# Patient Record
Sex: Female | Born: 1985 | Race: White | Hispanic: No | Marital: Married | Smoking: Former smoker
Health system: Southern US, Community
[De-identification: ages and names within clinical notes are randomized; demographics above are authoritative.]

## PROBLEM LIST (undated history)

## (undated) DIAGNOSIS — E7800 Pure hypercholesterolemia, unspecified: Secondary | ICD-10-CM

## (undated) DIAGNOSIS — F410 Panic disorder [episodic paroxysmal anxiety] without agoraphobia: Secondary | ICD-10-CM

## (undated) DIAGNOSIS — J189 Pneumonia, unspecified organism: Secondary | ICD-10-CM

## (undated) DIAGNOSIS — F32A Depression, unspecified: Secondary | ICD-10-CM

## (undated) DIAGNOSIS — R0602 Shortness of breath: Secondary | ICD-10-CM

## (undated) DIAGNOSIS — F1990 Other psychoactive substance use, unspecified, uncomplicated: Secondary | ICD-10-CM

## (undated) DIAGNOSIS — E282 Polycystic ovarian syndrome: Secondary | ICD-10-CM

## (undated) DIAGNOSIS — C50919 Malignant neoplasm of unspecified site of unspecified female breast: Secondary | ICD-10-CM

## (undated) DIAGNOSIS — K219 Gastro-esophageal reflux disease without esophagitis: Secondary | ICD-10-CM

## (undated) DIAGNOSIS — K760 Fatty (change of) liver, not elsewhere classified: Secondary | ICD-10-CM

## (undated) DIAGNOSIS — I499 Cardiac arrhythmia, unspecified: Secondary | ICD-10-CM

## (undated) DIAGNOSIS — R12 Heartburn: Secondary | ICD-10-CM

## (undated) DIAGNOSIS — E559 Vitamin D deficiency, unspecified: Secondary | ICD-10-CM

## (undated) DIAGNOSIS — E119 Type 2 diabetes mellitus without complications: Secondary | ICD-10-CM

## (undated) DIAGNOSIS — K429 Umbilical hernia without obstruction or gangrene: Secondary | ICD-10-CM

## (undated) DIAGNOSIS — K5900 Constipation, unspecified: Secondary | ICD-10-CM

## (undated) DIAGNOSIS — R079 Chest pain, unspecified: Secondary | ICD-10-CM

## (undated) DIAGNOSIS — I10 Essential (primary) hypertension: Secondary | ICD-10-CM

## (undated) DIAGNOSIS — F419 Anxiety disorder, unspecified: Secondary | ICD-10-CM

## (undated) DIAGNOSIS — M7989 Other specified soft tissue disorders: Secondary | ICD-10-CM

## (undated) DIAGNOSIS — R000 Tachycardia, unspecified: Secondary | ICD-10-CM

## (undated) DIAGNOSIS — Z9221 Personal history of antineoplastic chemotherapy: Secondary | ICD-10-CM

## (undated) DIAGNOSIS — M549 Dorsalgia, unspecified: Secondary | ICD-10-CM

## (undated) DIAGNOSIS — D649 Anemia, unspecified: Secondary | ICD-10-CM

## (undated) DIAGNOSIS — G5602 Carpal tunnel syndrome, left upper limb: Secondary | ICD-10-CM

## (undated) DIAGNOSIS — R002 Palpitations: Secondary | ICD-10-CM

## (undated) DIAGNOSIS — M2550 Pain in unspecified joint: Secondary | ICD-10-CM

## (undated) DIAGNOSIS — E611 Iron deficiency: Secondary | ICD-10-CM

## (undated) HISTORY — PX: ADENOIDECTOMY: SUR15

## (undated) HISTORY — DX: Shortness of breath: R06.02

## (undated) HISTORY — DX: Pain in unspecified joint: M25.50

## (undated) HISTORY — DX: Anxiety disorder, unspecified: F41.9

## (undated) HISTORY — DX: Other psychoactive substance use, unspecified, uncomplicated: F19.90

## (undated) HISTORY — DX: Dorsalgia, unspecified: M54.9

## (undated) HISTORY — DX: Other specified soft tissue disorders: M79.89

## (undated) HISTORY — DX: Iron deficiency: E61.1

## (undated) HISTORY — DX: Palpitations: R00.2

## (undated) HISTORY — DX: Constipation, unspecified: K59.00

## (undated) HISTORY — DX: Chest pain, unspecified: R07.9

## (undated) HISTORY — DX: Heartburn: R12

## (undated) HISTORY — DX: Pure hypercholesterolemia, unspecified: E78.00

## (undated) HISTORY — DX: Vitamin D deficiency, unspecified: E55.9

## (undated) HISTORY — DX: Carpal tunnel syndrome, left upper limb: G56.02

## (undated) HISTORY — DX: Tachycardia, unspecified: R00.0

## (undated) HISTORY — PX: TONSILLECTOMY: SUR1361

## (undated) HISTORY — DX: Malignant neoplasm of unspecified site of unspecified female breast: C50.919

## (undated) HISTORY — DX: Fatty (change of) liver, not elsewhere classified: K76.0

---

## 2013-05-21 LAB — HM HIV SCREENING LAB: HM HIV Screening: NEGATIVE

## 2015-02-07 ENCOUNTER — Ambulatory Visit: Payer: Self-pay | Admitting: Skilled Nursing Facility1

## 2016-11-06 LAB — HM PAP SMEAR: HM Pap smear: NEGATIVE

## 2016-11-06 LAB — RESULTS CONSOLE HPV: CHL HPV: NEGATIVE

## 2016-11-14 ENCOUNTER — Other Ambulatory Visit (HOSPITAL_COMMUNITY): Payer: Self-pay | Admitting: Family

## 2016-11-14 DIAGNOSIS — R0602 Shortness of breath: Secondary | ICD-10-CM

## 2016-11-23 ENCOUNTER — Other Ambulatory Visit (HOSPITAL_COMMUNITY): Payer: Self-pay

## 2016-11-25 ENCOUNTER — Emergency Department: Payer: Medicaid Other

## 2016-11-25 ENCOUNTER — Emergency Department
Admission: EM | Admit: 2016-11-25 | Discharge: 2016-11-26 | Disposition: A | Discharge status: Home / Self Care | Payer: Medicaid Other | Attending: Emergency Medicine | Admitting: Emergency Medicine

## 2016-11-25 ENCOUNTER — Encounter: Payer: Self-pay | Admitting: Emergency Medicine

## 2016-11-25 DIAGNOSIS — Z79899 Other long term (current) drug therapy: Secondary | ICD-10-CM | POA: Diagnosis not present

## 2016-11-25 DIAGNOSIS — Z7984 Long term (current) use of oral hypoglycemic drugs: Secondary | ICD-10-CM | POA: Diagnosis not present

## 2016-11-25 DIAGNOSIS — E119 Type 2 diabetes mellitus without complications: Secondary | ICD-10-CM | POA: Insufficient documentation

## 2016-11-25 DIAGNOSIS — Z87891 Personal history of nicotine dependence: Secondary | ICD-10-CM | POA: Diagnosis not present

## 2016-11-25 DIAGNOSIS — I10 Essential (primary) hypertension: Secondary | ICD-10-CM | POA: Diagnosis not present

## 2016-11-25 DIAGNOSIS — R079 Chest pain, unspecified: Secondary | ICD-10-CM | POA: Diagnosis present

## 2016-11-25 DIAGNOSIS — R0789 Other chest pain: Secondary | ICD-10-CM

## 2016-11-25 HISTORY — DX: Polycystic ovarian syndrome: E28.2

## 2016-11-25 HISTORY — DX: Panic disorder (episodic paroxysmal anxiety): F41.0

## 2016-11-25 HISTORY — DX: Type 2 diabetes mellitus without complications: E11.9

## 2016-11-25 HISTORY — DX: Essential (primary) hypertension: I10

## 2016-11-25 LAB — BASIC METABOLIC PANEL
ANION GAP: 12 (ref 5–15)
BUN: 15 mg/dL (ref 6–20)
CHLORIDE: 99 mmol/L — AB (ref 101–111)
CO2: 23 mmol/L (ref 22–32)
Calcium: 9.4 mg/dL (ref 8.9–10.3)
Creatinine, Ser: 0.9 mg/dL (ref 0.44–1.00)
GFR calc non Af Amer: >60 mL/min (ref >60)
Glucose, Bld: 197 mg/dL — ABNORMAL HIGH (ref 65–99)
POTASSIUM: 3.8 mmol/L (ref 3.5–5.1)
Sodium: 134 mmol/L — ABNORMAL LOW (ref 135–145)

## 2016-11-25 LAB — CBC
HEMATOCRIT: 38.1 % (ref 35.0–47.0)
HEMOGLOBIN: 12.8 g/dL (ref 12.0–16.0)
MCH: 24.3 pg — AB (ref 26.0–34.0)
MCHC: 33.6 g/dL (ref 32.0–36.0)
MCV: 72.3 fL — AB (ref 80.0–100.0)
Platelets: 343 10*3/uL (ref 150–440)
RBC: 5.26 MIL/uL — AB (ref 3.80–5.20)
RDW: 23.5 % — ABNORMAL HIGH (ref 11.5–14.5)
WBC: 10.7 10*3/uL (ref 3.6–11.0)

## 2016-11-25 LAB — TROPONIN I: Troponin I: <0.03 ng/mL (ref <0.03)

## 2016-11-25 LAB — HCG, QUANTITATIVE, PREGNANCY: hCG, Beta Chain, Quant, S: 1 m[IU]/mL (ref <5)

## 2016-11-25 MED ORDER — IOPAMIDOL (ISOVUE-370) INJECTION 76%
75.0000 mL | Freq: Once | INTRAVENOUS | Status: AC | PRN
  Administered 2016-11-25: 150 mL via INTRAVENOUS

## 2016-11-25 NOTE — ED Notes (Signed)
Patient transported to CT 

## 2016-11-25 NOTE — ED Triage Notes (Signed)
Pt c/o left sided chest tightness for 45 minutes; radiates down her left arm; pt says she started having ringing in her left ear and then the chest pain started; short of breath, nausea but no vomiting, feeling weak, dizzy and lightheaded; pt was ambulatory into lobby with steady gait and assisted to wheelchair; talking in complete coherent sentences; pt says she has a history of panic attacks but "It's a fine line"

## 2016-11-25 NOTE — ED Notes (Signed)
Patient transported to X-ray 

## 2016-11-25 NOTE — ED Provider Notes (Signed)
[facility] Emergency Department Provider Note  ____________________________________________   First MD Initiated Contact with Patient 11/25/16 2309     (approximate)  I have reviewed the triage vital signs and the nursing notes.   HISTORY  Chief Complaint Chest Pain   HPI Lynn [name] is a 31 y.o. female who self presents to the emergency department with chest pain and shortness of breath. The pain began insidiously roughly an hour prior to arrival. It began after putting her daughter to sleep in the basement climbing stairs. It is sharp and tight moderate severity left chest constant and nonexertional radiating to her left arm. It seems to have eased off on its own now. It is associated with a sense of anxiety. She has a history of hypertension and diabetes. She has had no recent surgery travel or immobilization. No hemoptysis. She  Does not take OCPs. She is had some bilateral lower extremity swelling recently. She's never had a DVT or pulmonary embolism. She has a family history of hypertension.   Past Medical History:  Diagnosis Date  . Diabetes ([place])   . Hypertension   . Panic attacks   . PCOS (polycystic ovarian syndrome)     There are no active problems to display for this patient.   Past Surgical History:  Procedure Laterality Date  . ADENOIDECTOMY    . CESAREAN SECTION    . TONSILLECTOMY      Prior to Admission medications   Medication Sig Start Date End Date Taking? Authorizing Provider  ferrous sulfate 325 (65 FE) MG EC tablet Take 325 mg by mouth 3 (three) times daily with meals.   Yes [provider]  lisinopril (PRINIVIL,ZESTRIL) 10 MG tablet Take 10 mg by mouth daily.   Yes [provider]  metFORMIN (GLUCOPHAGE) 1000 MG tablet Take 1,000 mg by mouth 2 (two) times daily with a meal.   Yes [provider]    Allergies Ibuprofen  History reviewed. No pertinent family history.  Social History Social  History  Substance Use Topics  . Smoking status: Former Research scientist (life sciences)  . Smokeless tobacco: Never Used  . Alcohol use Yes     Comment: rarely    Review of Systems Constitutional: No fever/chills Eyes: No visual changes. ENT: No sore throat. Cardiovascular: positive chest pain. Respiratory: positive shortness of breath. Gastrointestinal: No abdominal pain.  No nausea, no vomiting.  No diarrhea.  No constipation. Genitourinary: Negative for dysuria. Musculoskeletal: Negative for back pain. Skin: Negative for rash. Neurological: Negative for headaches, focal weakness or numbness.   ____________________________________________   PHYSICAL EXAM:  VITAL SIGNS: ED Triage Vitals  Enc Vitals Group     BP 11/25/16 2207 132/73     Pulse Rate 11/25/16 2207 (!) 114     Resp 11/25/16 2207 (!) 32     Temp 11/25/16 2207 98.6 F (37 C)     Temp Source 11/25/16 2207 Oral     SpO2 11/25/16 2207 96 %     Weight 11/25/16 2208 (!) 342 lb (155.1 kg)     Height 11/25/16 2208 5\' 7"  (1.702 m)     Head Circumference --      Peak Flow --      Pain Score 11/25/16 2207 6     Pain Loc --      Pain Edu? --      Excl. in [place]? --     Constitutional: alert and oriented 4 appears somewhat short of breath but able to speak  in full clear sentences Eyes: PERRL EOMI. Head: Atraumatic. Nose: No congestion/rhinnorhea. Mouth/Throat: No trismus Neck: No stridor.   Cardiovascular: tachycardic rate, regular rhythm. Grossly normal heart sounds.  Good peripheral circulation. Respiratory: Normal respiratory effort.  No retractions. Lungs CTAB and moving good air Gastrointestinal: obese soft nontender Musculoskeletal: No lower extremity edema  Legs are equal in size Neurologic:  Normal speech and language. No gross focal neurologic deficits are appreciated. Skin:  Skin is warm, dry and intact. No rash noted. Psychiatric: Mood and affect are normal. Speech and behavior are  normal.    ____________________________________________   DIFFERENTIAL includes but not limited to  Pulmonary embolism, acute coronary syndrome, pneumonia, pneumothorax, Boerhaave syndrome, aortic dissection, pleurisy ____________________________________________   LABS (all labs ordered are listed, but only abnormal results are displayed)  Labs Reviewed  BASIC METABOLIC PANEL - Abnormal; Notable for the following:       Result Value   Sodium 134 (*)    Chloride 99 (*)    Glucose, Bld 197 (*)    All other components within normal limits  CBC - Abnormal; Notable for the following:    RBC 5.26 (*)    MCV 72.3 (*)    MCH 24.3 (*)    RDW 23.5 (*)    All other components within normal limits  TROPONIN I  TROPONIN I  HCG, QUANTITATIVE, PREGNANCY    Troponin negative 2 __________________________________________  EKG  ED ECG REPORT I, Darel Hong, the attending physician, personally viewed and interpreted this ECG.  Date: 11/25/2016 EKG Time: 2205 Rate: 130 Rhythm: inus tachycardia QRS Axis: normal Intervals: normal ST/T Wave abnormalities: normal Narrative Interpretation: sinus tachycardia with no signs of acute ischemia abnormal EKG  ____________________________________________  RADIOLOGY  CT scan was suboptimal but no large pulmonary embolism ____________________________________________   PROCEDURES  Procedure(s) performed: no  Procedures  Critical Care performed: no  Observation: yes  ----------------------------------------- 2315 on 11/25/2016 -----------------------------------------   OBSERVATION CARE: This patient is being placed under observation care for the following reasons: Chest pain with repeat testing to rule out ischemia   ____________________________________________   INITIAL IMPRESSION / ASSESSMENT AND PLAN / ED COURSE  Pertinent labs & imaging results that were available during my care of the patient were reviewed by me  and considered in my medical decision making (see chart for details).  The patient arrives somewhat short of breath and tachycardic although she said that she has long-standing tachycardia of unclear etiology. Differential is broad but most prominently unconcerned about pulmonary embolism. I do not believe the patient is low risk so d-dimer is an adequate and she will require CT scan as well as a delta troponin.  ----------------------------------------- 1:26 AM on 11/26/2016 ----------------------------------------- The patient's pain is improved pending a repeat troponin     ------------------------------------ 3:31 AM on 11/26/2016 -----------------------------------------   END OF OBSERVATION STATUS: After an appropriate period of observation, this patient is being discharged due to the following reason(s):  Second troponin negative and CT imaging negative. The patient feels improved. I will refer her to cardiology outpatient and she is medically stable for outpatient management. She does not require inpatient risk stratification. ___________________________________________   FINAL CLINICAL IMPRESSION(S) / ED DIAGNOSES  Final diagnoses:  Atypical chest pain      NEW MEDICATIONS STARTED DURING THIS VISIT:  Discharge Medication List as of 11/26/2016  3:30 AM       Note:  This document was prepared using Dragon voice recognition software and may include unintentional dictation errors.  Darel Hong, MD 11/26/16 [phone]

## 2016-11-26 ENCOUNTER — Telehealth: Payer: Self-pay

## 2016-11-26 LAB — TROPONIN I: Troponin I: <0.03 ng/mL (ref <0.03)

## 2016-11-26 NOTE — Discharge Instructions (Signed)
Fortunately today your blood work as well as your CT scan were normal. I do not know exactly why you had chest pain, and I would like you to see a cardiologist later on this week for recheck. Return to the emergency department for any concerns.  It was a pleasure to take care of you today, and thank you for coming to our emergency department.  If you have any questions or concerns before leaving please ask the nurse to grab me and I'm more than happy to go through your aftercare instructions again.  If you were prescribed any opioid pain medication today such as Norco, Vicodin, Percocet, morphine, hydrocodone, or oxycodone please make sure you do not drive when you are taking this medication as it can alter your ability to drive safely.  If you have any concerns once you are home that you are not improving or are in fact getting worse before you can make it to your follow-up appointment, please do not hesitate to call 911 and come back for further evaluation.  Darel Hong, MD  Results for orders placed or performed during the hospital encounter of 85/63/14  Basic metabolic panel  Result Value Ref Range   Sodium 134 (L) 135 - 145 mmol/L   Potassium 3.8 3.5 - 5.1 mmol/L   Chloride 99 (L) 101 - 111 mmol/L   CO2 23 22 - 32 mmol/L   Glucose, Bld 197 (H) 65 - 99 mg/dL   BUN 15 6 - 20 mg/dL   Creatinine, Ser 0.90 0.44 - 1.00 mg/dL   Calcium 9.4 8.9 - 10.3 mg/dL   GFR calc non Af Amer >60 >60 mL/min   GFR calc Af Amer >60 >60 mL/min   Anion gap 12 5 - 15  CBC  Result Value Ref Range   WBC 10.7 3.6 - 11.0 K/uL   RBC 5.26 (H) 3.80 - 5.20 MIL/uL   Hemoglobin 12.8 12.0 - 16.0 g/dL   HCT 38.1 35.0 - 47.0 %   MCV 72.3 (L) 80.0 - 100.0 fL   MCH 24.3 (L) 26.0 - 34.0 pg   MCHC 33.6 32.0 - 36.0 g/dL   RDW 23.5 (H) 11.5 - 14.5 %   Platelets 343 150 - 440 K/uL  Troponin I  Result Value Ref Range   Troponin I <0.03 <0.03 ng/mL  Troponin I  Result Value Ref Range   Troponin I <0.03 <0.03 ng/mL    hCG, quantitative, pregnancy  Result Value Ref Range   hCG, Beta Chain, Quant, S 1 <5 mIU/mL   Dg Chest 2 View  Result Date: 11/25/2016 CLINICAL DATA:  Left-sided chest tightness EXAM: CHEST  2 VIEW COMPARISON:  None. FINDINGS: Accentuated perihilar interstitial opacity. No focal consolidation or pleural effusion. Normal cardiomediastinal silhouette. No pneumothorax. IMPRESSION: 1. No radiographic evidence for acute cardiopulmonary abnormality. Electronically Signed   By: Donavan Foil M.D.   On: 11/25/2016 22:41   Ct Angio Chest Pe W/cm &/or Wo Cm  Result Date: 11/26/2016 CLINICAL DATA:  Left-sided chest pain EXAM: CT ANGIOGRAPHY CHEST WITH CONTRAST TECHNIQUE: Multidetector CT imaging of the chest was performed using the standard protocol during bolus administration of intravenous contrast. Multiplanar CT image reconstructions and MIPs were obtained to evaluate the vascular anatomy. CONTRAST:  150 mL Isovue 370 intravenous COMPARISON:  11/25/2016 FINDINGS: Cardiovascular: Suboptimal study secondary to large habitus with overall grainy appearance of images. Suboptimal opacification of pulmonary arterial system despite re- bolus. Respiratory motion artifact. No significant filling defects visualized within the central  or main segmental pulmonary arterial system to suggest acute embolus. Normal heart size. No pericardial effusion. Non aneurysmal aorta. Mediastinum/Nodes: No enlarged mediastinal, hilar, or axillary lymph nodes. Thyroid gland, trachea, and esophagus demonstrate no significant findings. Lungs/Pleura: Lungs are clear. No pleural effusion or pneumothorax. Upper Abdomen: Fatty liver.  No acute abnormality. Musculoskeletal: No chest wall abnormality. No acute or significant osseous findings. Review of the MIP images confirms the above findings. IMPRESSION: 1. Suboptimal study secondary to habitus, respiratory motion and suboptimal bolus. No acute pulmonary embolus is seen. 2. Clear lung fields 3.  Hepatic steatosis Electronically Signed   By: Donavan Foil M.D.   On: 11/26/2016 00:08

## 2016-11-26 NOTE — ED Notes (Signed)
Patient r/f CT. 

## 2016-11-26 NOTE — Telephone Encounter (Signed)
Lmov for patient to call back  They were seen in ED on 11/25/16 with CP Will try again at a later time

## 2016-11-28 ENCOUNTER — Ambulatory Visit (HOSPITAL_COMMUNITY)
Admission: RE | Admit: 2016-11-28 | Discharge: 2016-11-28 | Disposition: A | Discharge status: Home / Self Care | Payer: Medicaid Other | Source: Ambulatory Visit | Attending: Family | Admitting: Family

## 2016-11-28 DIAGNOSIS — R0602 Shortness of breath: Secondary | ICD-10-CM | POA: Diagnosis not present

## 2016-11-28 DIAGNOSIS — E119 Type 2 diabetes mellitus without complications: Secondary | ICD-10-CM | POA: Insufficient documentation

## 2016-11-28 DIAGNOSIS — I10 Essential (primary) hypertension: Secondary | ICD-10-CM | POA: Insufficient documentation

## 2016-11-28 NOTE — Progress Notes (Signed)
  Echocardiogram 2D Echocardiogram has been performed.  Lynn [name] 11/28/2016, 4:20 PM

## 2017-01-03 ENCOUNTER — Emergency Department: Payer: Medicaid Other

## 2017-01-03 ENCOUNTER — Encounter: Payer: Self-pay | Admitting: Emergency Medicine

## 2017-01-03 ENCOUNTER — Emergency Department
Admission: EM | Admit: 2017-01-03 | Discharge: 2017-01-03 | Disposition: A | Discharge status: Home / Self Care | Payer: Medicaid Other | Attending: Emergency Medicine | Admitting: Emergency Medicine

## 2017-01-03 DIAGNOSIS — I10 Essential (primary) hypertension: Secondary | ICD-10-CM | POA: Insufficient documentation

## 2017-01-03 DIAGNOSIS — R42 Dizziness and giddiness: Secondary | ICD-10-CM

## 2017-01-03 DIAGNOSIS — E119 Type 2 diabetes mellitus without complications: Secondary | ICD-10-CM | POA: Diagnosis not present

## 2017-01-03 DIAGNOSIS — Z87891 Personal history of nicotine dependence: Secondary | ICD-10-CM | POA: Insufficient documentation

## 2017-01-03 DIAGNOSIS — R55 Syncope and collapse: Secondary | ICD-10-CM | POA: Insufficient documentation

## 2017-01-03 DIAGNOSIS — K529 Noninfective gastroenteritis and colitis, unspecified: Secondary | ICD-10-CM | POA: Diagnosis not present

## 2017-01-03 LAB — CBC
HCT: 40.3 % (ref 35.0–47.0)
Hemoglobin: 13.6 g/dL (ref 12.0–16.0)
MCH: 25.8 pg — ABNORMAL LOW (ref 26.0–34.0)
MCHC: 33.8 g/dL (ref 32.0–36.0)
MCV: 76.4 fL — AB (ref 80.0–100.0)
PLATELETS: 323 10*3/uL (ref 150–440)
RBC: 5.28 MIL/uL — ABNORMAL HIGH (ref 3.80–5.20)
RDW: 15.7 % — AB (ref 11.5–14.5)
WBC: 10.2 10*3/uL (ref 3.6–11.0)

## 2017-01-03 LAB — BASIC METABOLIC PANEL
Anion gap: 12 (ref 5–15)
BUN: 11 mg/dL (ref 6–20)
CALCIUM: 9.2 mg/dL (ref 8.9–10.3)
CO2: 23 mmol/L (ref 22–32)
CREATININE: 0.79 mg/dL (ref 0.44–1.00)
Chloride: 98 mmol/L — ABNORMAL LOW (ref 101–111)
GFR calc Af Amer: >60 mL/min (ref >60)
Glucose, Bld: 256 mg/dL — ABNORMAL HIGH (ref 65–99)
Potassium: 4.4 mmol/L (ref 3.5–5.1)
SODIUM: 133 mmol/L — AB (ref 135–145)

## 2017-01-03 LAB — TROPONIN I

## 2017-01-03 LAB — GLUCOSE, CAPILLARY: GLUCOSE-CAPILLARY: 246 mg/dL — AB (ref 65–99)

## 2017-01-03 MED ORDER — SODIUM CHLORIDE 0.9 % IV SOLN
Freq: Once | INTRAVENOUS | Status: AC
  Administered 2017-01-03: 16:00:00 via INTRAVENOUS

## 2017-01-03 MED ORDER — SODIUM CHLORIDE 0.9 % IV SOLN: Freq: Once | INTRAVENOUS | Status: DC

## 2017-01-03 MED ORDER — ONDANSETRON 4 MG PO TBDP: 4.0000 mg | ORAL_TABLET | Freq: Three times a day (TID) | ORAL | 0 refills | Status: DC | PRN

## 2017-01-03 NOTE — ED Provider Notes (Signed)
[facility] Emergency Department Provider Note       Time seen: ----------------------------------------- 4:25 PM on 01/03/2017 -----------------------------------------     I have reviewed the triage vital signs and the nursing notes.   HISTORY   Chief Complaint Dizziness    HPI Lynn [name] is a 31 y.o. female who presents to the ED for intermittent left arm tingling and pains last night. Patient reports she "just feels awful". Patient reports some blurred vision, nausea and diarrhea for the past 3 days. She has had some vomiting as well. She states she is dyspneic with ambulation, wraps tachycardic but with otherwise normal vital signs. She denies fevers, chills or other complaints.  Past Medical History:  Diagnosis Date  . Diabetes ([place])   . Hypertension   . Panic attacks   . PCOS (polycystic ovarian syndrome)     There are no active problems to display for this patient.   Past Surgical History:  Procedure Laterality Date  . ADENOIDECTOMY    . CESAREAN SECTION    . TONSILLECTOMY      Allergies Ibuprofen  Social History Social History  Substance Use Topics  . Smoking status: Former Smoker    Types: Cigarettes  . Smokeless tobacco: Never Used  . Alcohol use Yes     Comment: rarely    Review of Systems Constitutional: Negative for fever. Cardiovascular: Negative for chest pain. Respiratory: Negative for shortness of breath. Gastrointestinal: positive for nausea, vomiting and diarrhea Genitourinary: Negative for dysuria. Musculoskeletal: Negative for back pain. Skin: Negative for rash. Neurological: positive for left arm paresthesias and tingling  All systems negative/normal/unremarkable except as stated in the HPI  ____________________________________________   PHYSICAL EXAM:  VITAL SIGNS: ED Triage Vitals  Enc Vitals Group     BP 01/03/17 1414 (!) 136/97     Pulse Rate 01/03/17 1519 (!) 113     Resp 01/03/17 1414  (!) 22     Temp 01/03/17 1414 97.8 F (36.6 C)     Temp Source 01/03/17 1414 Oral     SpO2 01/03/17 1414 97 %     Weight 01/03/17 1414 (!) 338 lb (153.3 kg)     Height 01/03/17 1414 5\' 7"  (1.702 m)     Head Circumference --      Peak Flow --      Pain Score --      Pain Loc --      Pain Edu? --      Excl. in [place]? --     Constitutional: Alert and oriented. obese, no acute distress Eyes: Conjunctivae are normal. Normal extraocular movements. ENT   Head: Normocephalic and atraumatic.   Nose: No congestion/rhinnorhea.   Mouth/Throat: Mucous membranes are moist.   Neck: No stridor. Cardiovascular: Normal rate, regular rhythm. No murmurs, rubs, or gallops. Respiratory: Normal respiratory effort without tachypnea nor retractions. Breath sounds are clear and equal bilaterally. No wheezes/rales/rhonchi. Gastrointestinal: Soft and nontender. Normal bowel sounds Musculoskeletal: Nontender with normal range of motion in extremities. No lower extremity tenderness nor edema. Neurologic:  Normal speech and language. No gross focal neurologic deficits are appreciated.  Skin:  Skin is warm, dry and intact. No rash noted. Psychiatric: Mood and affect are normal. Speech and behavior are normal.  ____________________________________________  EKG: Interpreted by me.sinus tachycardia with a rate of 119 bpm, LVH, normal QRS width, normal QT.  ____________________________________________  ED COURSE:  Pertinent labs & imaging results that were available during my care of the patient were reviewed  by me and considered in my medical decision making (see chart for details). Patient presents for paresthesias as well as abdominal pain with vomiting and diarrhea, we will assess with labs and imaging as indicated. patient is likely dehydrated and will receive IV fluids.   Procedures ____________________________________________   LABS (pertinent positives/negatives)  Labs Reviewed  BASIC  METABOLIC PANEL - Abnormal; Notable for the following:       Result Value   Sodium 133 (*)    Chloride 98 (*)    Glucose, Bld 256 (*)    All other components within normal limits  CBC - Abnormal; Notable for the following:    RBC 5.28 (*)    MCV 76.4 (*)    MCH 25.8 (*)    RDW 15.7 (*)    All other components within normal limits  GLUCOSE, CAPILLARY - Abnormal; Notable for the following:    Glucose-Capillary 246 (*)    All other components within normal limits  TROPONIN I   chest x-ray is normal ____________________________________________  DIFFERENTIAL DIAGNOSIS   electrolyte abnormality, dehydration, gastroenteritis, paresthesias, PE, arrhythmia  FINAL ASSESSMENT AND PLAN  near syncope, gastroenteritis   Plan: Patient's labs and imaging were dictated above. Patient had presented for near syncope and dizziness likely from vomiting and diarrhea. She does have mild hyperglycemia but is not in DKA and otherwise appears stable for outpatient follow-up. I will prescribe antiemetics to take as needed.   Earleen Newport, MD   Note: This note was generated in part or whole with voice recognition software. Voice recognition is usually quite accurate but there are transcription errors that can and very often do occur. I apologize for any typographical errors that were not detected and corrected.     Earleen Newport, MD 01/03/17 [phone]

## 2017-01-03 NOTE — ED Notes (Signed)
Pt reports starting new medication of lisinopril 10mg  and iron supplement 2 months ago.  Pt reports that her current doctor is a new doctor to her.  Pt states that she also recently increased her metformin from 500mg  BID to 1000mg  BID.  Pt appears somewhat anxious and speaking rapidly at this time.  Pt states that she slept very hard last night and woke up with tingling and weakness in L side.  Pt states that she's been having blurred vision and dizziness for a couple of weeks and her PCP stated that she may need to have meds readjusted, but has no followed up as of yet.

## 2017-01-03 NOTE — ED Notes (Signed)
Patient transported to X-ray 

## 2017-01-03 NOTE — ED Triage Notes (Signed)
Pt in via POV with complaints of intermittent left arm tingling and pain since last night, pt states, "I just feel off."  Pt reports some intermittent blurred vision, nausea, diarrhea x 3 days.  Pt dyspneic w/ ambulation to triage room.  Pt tachycardic, other vitals WDL.  Pt reports having echocardiogram done 2 weeks ago due to some heart arrythmias but has not received the results of that yet.

## 2017-01-03 NOTE — ED Notes (Signed)
Pt discharged to home.  Family member driving.  Discharge instructions reviewed.  Verbalized understanding.  No questions or concerns at this time.  Teach back verified.  Pt in NAD.  No items left in ED.   

## 2017-03-19 ENCOUNTER — Encounter: Payer: Self-pay | Admitting: *Deleted

## 2017-03-27 ENCOUNTER — Ambulatory Visit (INDEPENDENT_AMBULATORY_CARE_PROVIDER_SITE_OTHER): Payer: Medicaid Other | Admitting: Cardiovascular Disease

## 2017-03-27 ENCOUNTER — Encounter: Payer: Self-pay | Admitting: Cardiovascular Disease

## 2017-03-27 VITALS — BP 100/68 | HR 94 | Ht 67.0 in | Wt 342.8 lb

## 2017-03-27 DIAGNOSIS — R002 Palpitations: Secondary | ICD-10-CM

## 2017-03-27 DIAGNOSIS — Z7689 Persons encountering health services in other specified circumstances: Secondary | ICD-10-CM | POA: Diagnosis not present

## 2017-03-27 NOTE — Patient Instructions (Signed)
Medication Instructions: No changes.   Labwork: None.   Procedures/Testing: None.   Follow-Up: As needed with Dr. Fletcher Anon.   Any Additional Special Instructions Will Be Listed Below (If Applicable).     If you need a refill on your cardiac medications before your next appointment, please call your pharmacy.

## 2017-03-27 NOTE — Progress Notes (Signed)
Cardiology Office Note   Date:  03/27/2017   ID:  Lynn [name], DOB 07/04/1985, MRN [id number]  PCP:  Imagene Riches, NP  Cardiologist:   Kathlyn Sacramento, MD   Chief Complaint  Patient presents with  . OTHER    Tachycardia. Meds reviewed verbally with pt.      History of Present Illness: Lynn [name] is a 31 y.o. female who was referred by Heide Scales for evaluation of palpitations and shortness of breath. She has multiple chronic medical conditions that include type 2 diabetes, morbid obesity, panic attacks and history of tachycardia. She had an emergency room visit in August for atypical chest pain with negative workup.  She had another visit in October for dizziness.  She reports prolonged history of sinus tachycardia but these were sent when she started having panic attacks. She had an echocardiogram in August 2018 in our office which showed normal LV systolic function with an EF of 65-70% with no significant valvular abnormalities. She was started on small dose carvedilol in October which was gradually increased to 12.5 mg twice daily.  Since then, she reports significant improvement in her palpitations with almost complete resolution of symptoms.  She is a previous smoker and quit about 3 years ago. She has strong family history of premature coronary artery disease.    Past Medical History:  Diagnosis Date  . Carpal tunnel syndrome, left   . Diabetes ([place])   . Hypertension   . Iron deficiency   . Panic attacks   . PCOS (polycystic ovarian syndrome)   . Tachycardia     Past Surgical History:  Procedure Laterality Date  . ADENOIDECTOMY    . CESAREAN SECTION    . TONSILLECTOMY       Current Outpatient Medications  Medication Sig Dispense Refill  . acetaminophen (TYLENOL) 325 MG tablet Take 650 mg by mouth every 4 (four) hours as needed.     . carvedilol (COREG) 12.5 MG tablet TK 1 T PO BID  0  . escitalopram (LEXAPRO) 10 MG tablet Take 10 mg by mouth  daily.    . ferrous sulfate 325 (65 FE) MG EC tablet Take 325 mg by mouth 3 (three) times daily with meals.    . liraglutide (VICTOZA) 18 MG/3ML SOPN Inject into the skin daily.     . metFORMIN (GLUCOPHAGE) 500 MG tablet Take 1,000 mg by mouth 2 (two) times daily with a meal.     No current facility-administered medications for this visit.     Allergies:   Ibuprofen    Social History:  The patient  reports that she quit smoking about 3 years ago. Her smoking use included cigarettes. She has a 7.00 pack-year smoking history. she has never used smokeless tobacco. She reports that she drinks alcohol. She reports that she does not use drugs.   Family History:  The patient's family history includes Diabetes in her mother; Heart attack in her father, mother, and sister; Heart disease in her father and mother; Heart failure in her maternal grandfather, maternal grandmother, mother, paternal grandfather, and paternal grandmother; Hypertension in her father and mother; Kidney disease in her mother.    ROS:  Please see the history of present illness.   Otherwise, review of systems are positive for none.   All other systems are reviewed and negative.    PHYSICAL EXAM: VS:  BP 100/68 (BP Location: Right Arm, Patient Position: Sitting, Cuff Size: Large)   Pulse 94   Ht 5'  7" (1.702 m)   Wt (!) 342 lb 12 oz (155.5 kg)   BMI 53.68 kg/m  , BMI Body mass index is 53.68 kg/m. GEN: Well nourished, well developed, in no acute distress  HEENT: normal  Neck: no JVD, carotid bruits, or masses Cardiac: RRR; no murmurs, rubs, or gallops,no edema  Respiratory:  clear to auscultation bilaterally, normal work of breathing GI: soft, nontender, nondistended, + BS MS: no deformity or atrophy  Skin: warm and dry, no rash Neuro:  Strength and sensation are intact Psych: euthymic mood, full affect   EKG:  EKG is ordered today. The ekg ordered today demonstrates normal sinus rhythm with no significant ST or T  wave changes.   Recent Labs: 01/03/2017: BUN 11; Creatinine, Ser 0.79; Hemoglobin 13.6; Platelets 323; Potassium 4.4; Sodium 133    Lipid Panel No results found for: CHOL, TRIG, HDL, CHOLHDL, VLDL, LDLCALC, LDLDIRECT    Wt Readings from Last 3 Encounters:  03/27/17 (!) 342 lb 12 oz (155.5 kg)  01/03/17 (!) 338 lb (153.3 kg)  11/25/16 (!) 342 lb (155.1 kg)      Other studies Reviewed: Additional studies/ records that were reviewed today include: Office notes from primary care physician labs.. Review of the above records demonstrates: Unremarkable labs including normal thyroid function.  PAD Screen 03/27/2017  Previous PAD dx? No  Previous surgical procedure? No  Pain with walking? No  Feet/toe relief with dangling? No  Painful, non-healing ulcers? No  Extremities discolored? No      ASSESSMENT AND PLAN:  1.  Palpitations likely due to sinus tachycardia: I do not suspect other significant arrhythmia.  All previous tracings were consistent with sinus tachycardia which is likely multifactorial and mostly due to physical deconditioning the setting of morbid obesity.  Inappropriate sinus tachycardia is also a possibility. I agree with treatment with a beta-blocker which has significantly controlled her symptoms.  Given the improvement in her symptoms, no need for further testing. Echocardiogram was reviewed and showed normal LV systolic function and no significant valvular abnormalities. I discussed with her the importance of starting an aerobic exercise program in order to improve conditioning.  2.  Morbid obesity: I discussed with her the importance of healthy diet and exercise.  If weight does not improve significantly with lifestyle changes I advised her to strongly consider surgical weight loss.  3.  Suspected sleep apnea: The patient does report symptoms suggestive of sleep apnea which might also cause sinus tachycardia.  Consider evaluation with a sleep  study.    Disposition:   FU with me as needed.   Signed,  Kathlyn Sacramento, MD  03/27/2017 3:06 PM    [facility]

## 2018-04-26 IMAGING — CT CT ANGIO CHEST
2 of 8 series · 12 of 46 positions shown · IV contrast (APPLIED)
Comparison: 11/25/2016

CLINICAL DATA: Left-sided chest pain

EXAM:
CT ANGIOGRAPHY CHEST WITH CONTRAST
TECHNIQUE: Multidetector CT imaging of the chest was performed using the
standard protocol during bolus administration of intravenous
contrast. Multiplanar CT image reconstructions and MIPs were
obtained to evaluate the vascular anatomy.
CONTRAST:  150 mL Isovue 370 intravenous

[Series 5: thins · axial · 0.80mm/px · z∈[-317,-107]mm · 10 of 258 slices shown]
[im 24/258  lung]
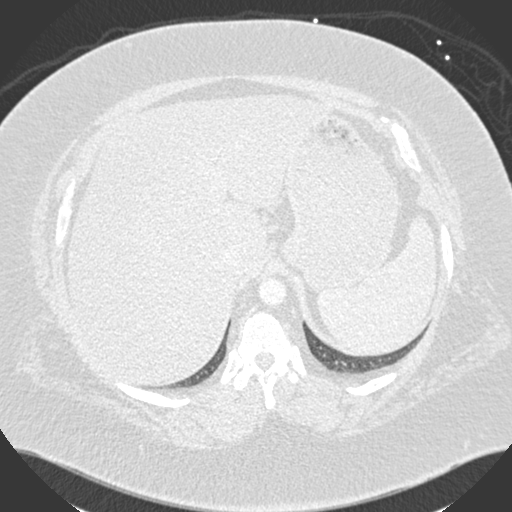
[im 47/258  soft-tissue]
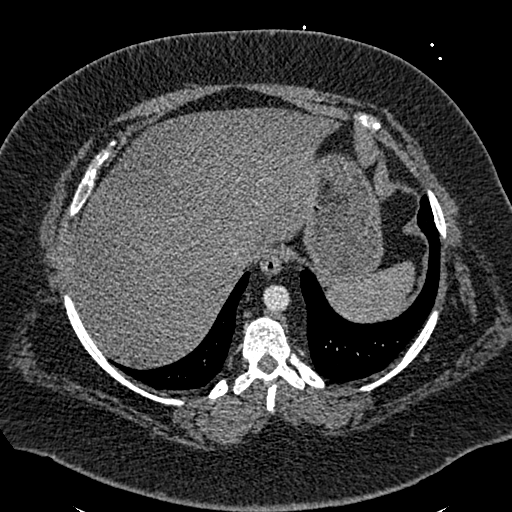
[im 71/258  lung]
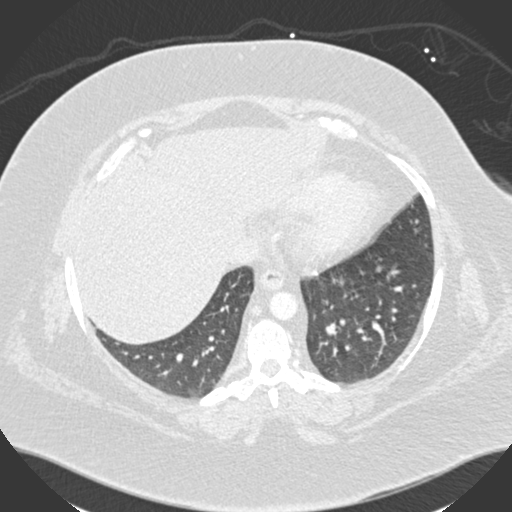
[im 94/258  soft-tissue]
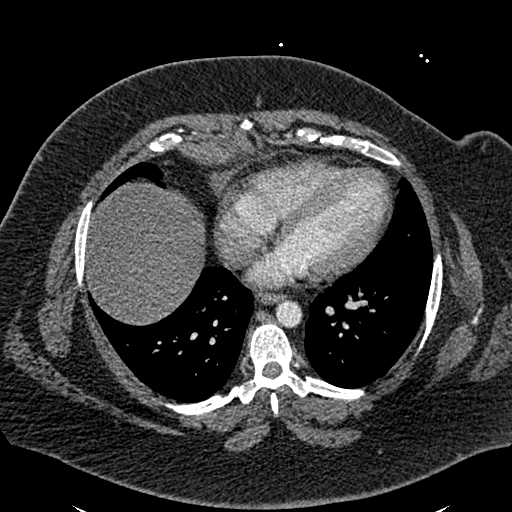
[im 117/258  lung]
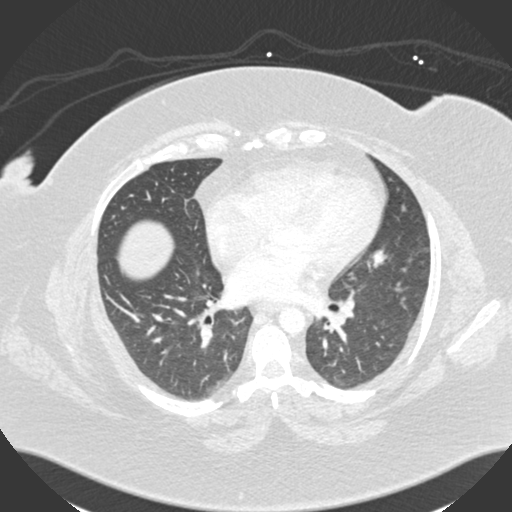
[im 141/258  soft-tissue]
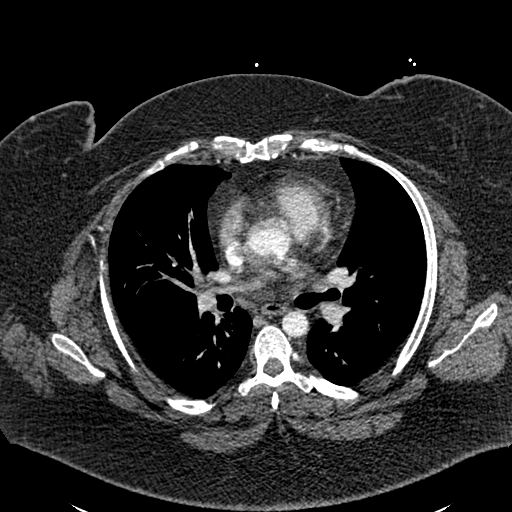
[im 164/258  lung]
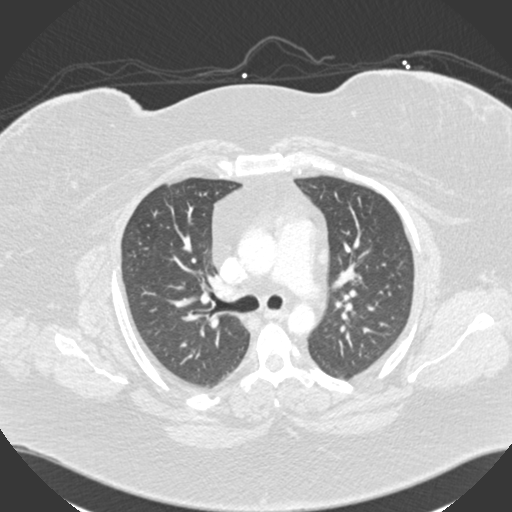
[im 187/258  soft-tissue]
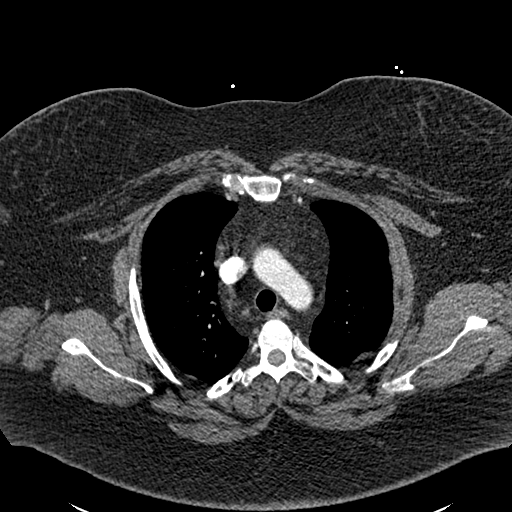
[im 211/258  lung]
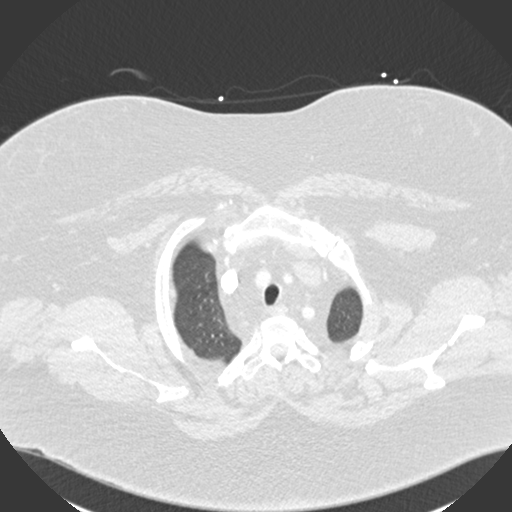
[im 234/258  soft-tissue]
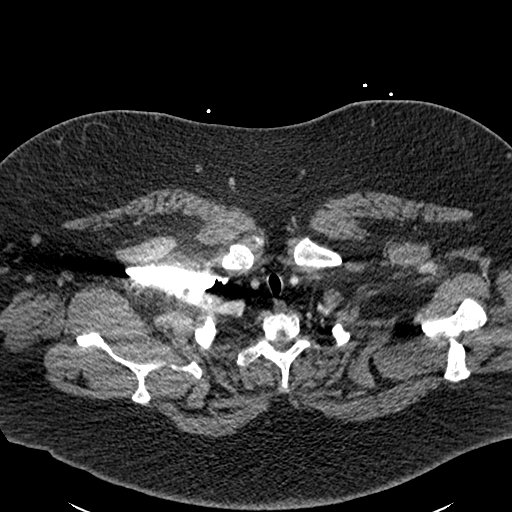

[Series 12: coronal mpr · coronal · 0.51mm/px · 2 of 101 slices shown]
[im 34/101  soft-tissue]
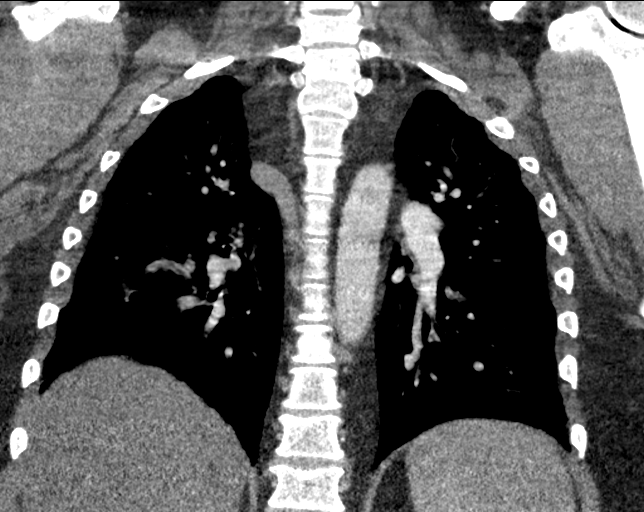
[im 67/101  soft-tissue]
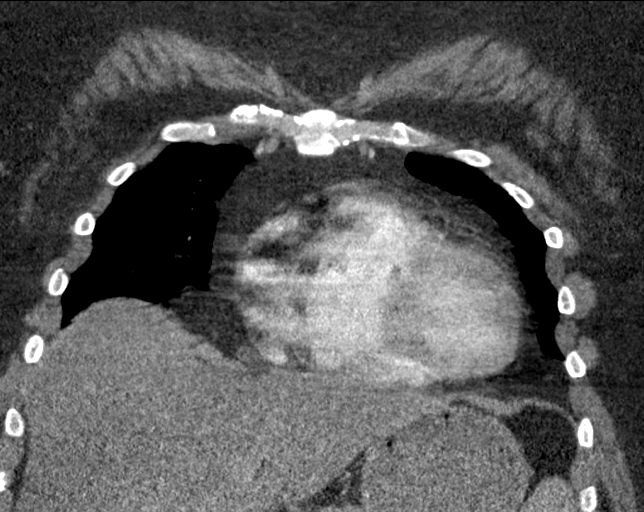

[12 of 46 positions shown; findings below may reference images not displayed]

FINDINGS: Cardiovascular: Suboptimal study secondary to large habitus with
overall grainy appearance of images. Suboptimal opacification of
pulmonary arterial system despite re- bolus. Respiratory motion
artifact. No significant filling defects visualized within the
central or main segmental pulmonary arterial system to suggest acute
embolus. Normal heart size. No pericardial effusion. Non aneurysmal
aorta.

Mediastinum/Nodes: No enlarged mediastinal, hilar, or axillary lymph
nodes. Thyroid gland, trachea, and esophagus demonstrate no
significant findings.

Lungs/Pleura: Lungs are clear. No pleural effusion or pneumothorax.

Upper Abdomen: Fatty liver.  No acute abnormality.

Musculoskeletal: No chest wall abnormality. No acute or significant
osseous findings.

Review of the MIP images confirms the above findings.
IMPRESSION: 1. Suboptimal study secondary to habitus, respiratory motion and
suboptimal bolus. No acute pulmonary embolus is seen.
2. Clear lung fields
3. Hepatic steatosis

## 2019-03-12 ENCOUNTER — Other Ambulatory Visit: Payer: Self-pay

## 2019-03-12 ENCOUNTER — Ambulatory Visit (INDEPENDENT_AMBULATORY_CARE_PROVIDER_SITE_OTHER): Payer: Managed Care, Other (non HMO) | Admitting: Family Medicine

## 2019-03-12 ENCOUNTER — Encounter: Payer: Self-pay | Admitting: Family Medicine

## 2019-03-12 ENCOUNTER — Other Ambulatory Visit: Payer: Self-pay | Admitting: Family Medicine

## 2019-03-12 VITALS — BP 144/82 | HR 108 | Temp 97.9°F | Ht 66.25 in | Wt 325.5 lb

## 2019-03-12 DIAGNOSIS — E1165 Type 2 diabetes mellitus with hyperglycemia: Secondary | ICD-10-CM

## 2019-03-12 DIAGNOSIS — F325 Major depressive disorder, single episode, in full remission: Secondary | ICD-10-CM | POA: Insufficient documentation

## 2019-03-12 DIAGNOSIS — Z8249 Family history of ischemic heart disease and other diseases of the circulatory system: Secondary | ICD-10-CM

## 2019-03-12 DIAGNOSIS — E1169 Type 2 diabetes mellitus with other specified complication: Secondary | ICD-10-CM | POA: Insufficient documentation

## 2019-03-12 DIAGNOSIS — I119 Hypertensive heart disease without heart failure: Secondary | ICD-10-CM | POA: Insufficient documentation

## 2019-03-12 DIAGNOSIS — R000 Tachycardia, unspecified: Secondary | ICD-10-CM

## 2019-03-12 DIAGNOSIS — F411 Generalized anxiety disorder: Secondary | ICD-10-CM

## 2019-03-12 DIAGNOSIS — F410 Panic disorder [episodic paroxysmal anxiety] without agoraphobia: Secondary | ICD-10-CM

## 2019-03-12 DIAGNOSIS — I10 Essential (primary) hypertension: Secondary | ICD-10-CM | POA: Diagnosis not present

## 2019-03-12 DIAGNOSIS — E119 Type 2 diabetes mellitus without complications: Secondary | ICD-10-CM | POA: Insufficient documentation

## 2019-03-12 DIAGNOSIS — F330 Major depressive disorder, recurrent, mild: Secondary | ICD-10-CM

## 2019-03-12 MED ORDER — LIRAGLUTIDE 18 MG/3ML ~~LOC~~ SOPN: 1.8000 mg | PEN_INJECTOR | Freq: Every day | SUBCUTANEOUS | 3 refills | Status: DC

## 2019-03-12 MED ORDER — CARVEDILOL 25 MG PO TABS: 25.0000 mg | ORAL_TABLET | Freq: Two times a day (BID) | ORAL | 3 refills | Status: DC

## 2019-03-12 MED ORDER — FLUOXETINE HCL 20 MG PO TABS: ORAL_TABLET | ORAL | 3 refills | Status: DC

## 2019-03-12 MED ORDER — METFORMIN HCL 500 MG PO TABS: 1000.0000 mg | ORAL_TABLET | Freq: Two times a day (BID) | ORAL | 1 refills | Status: DC

## 2019-03-12 NOTE — Progress Notes (Signed)
I connected with Lynn [name] on 03/12/19 at  3:20 PM EST by video and verified that I am speaking with the correct person using two identifiers.   I discussed the limitations, risks, security and privacy concerns of performing an evaluation and management service by video and the availability of in person appointments. I also discussed with the patient that there may be a patient responsible charge related to this service. The patient expressed understanding and agreed to proceed.  Patient location: Home Provider Location: [place] Participants: OWYN WILBUR and York Ram   Subjective:     Lynn [name] is a 33 y.o. female presenting for Establish Care (previous PCP [facility]) and Medication Refill     HPI   Has been out of medication x 2 months  #Tachycardia - has noticed spikes with activity - was helping with anxiety and hr - was on medication for HTN, but stopped b/c BP was low  #obesity - working, eating less - doing 10,000 steps at work daily - does not followed diabetic diet - does want to improve her health - not interested in surgery at this time  #Diabetes - Hgb A1c was 9-10 when last checked - not following the diabetic diet - difficulty tolerating medication due to GI side effects - was not taking full dose of metformin  #Depression #anxiety - felt like it was working initially - felt odd at the higher dose of lexapro - sister had a bad experience with the medication - has noticed more anxiety off the carvedilol - knows when an attack coming on - had a lot of panic episodes after sister - has been off medication - would like to switch to prozac    Review of Systems  Constitutional: Negative for chills and fever.  HENT: Negative for congestion and sore throat.   Respiratory: Negative for shortness of breath.   Cardiovascular: Positive for palpitations. Negative for chest pain.  Gastrointestinal: Negative for nausea  and vomiting.  Genitourinary: Negative.   Musculoskeletal: Negative.  Negative for myalgias.  Skin: Negative for rash.  Neurological: Negative for dizziness and headaches.  Hematological: Does not bruise/bleed easily.  Psychiatric/Behavioral: The patient is nervous/anxious.      Social History   Tobacco Use  Smoking Status Former Smoker  . Packs/day: 1.00  . Years: 7.00  . Pack years: 7.00  . Types: Cigarettes  . Quit date: 03/19/2014  . Years since quitting: 4.9  Smokeless Tobacco Never Used        Objective:   BP Readings from Last 3 Encounters:  03/12/19 (!) 144/82  03/27/17 100/68  01/03/17 (!) 144/75   Wt Readings from Last 3 Encounters:  03/12/19 (!) 325 lb 8 oz (147.6 kg)  03/27/17 (!) 342 lb 12 oz (155.5 kg)  01/03/17 (!) 338 lb (153.3 kg)    BP (!) 144/82 (BP Location: Right Arm, Patient Position: Sitting, Cuff Size: Large)   Pulse (!) 108   Temp 97.9 F (36.6 C) (Temporal)   Ht 5' 6.25" (1.683 m)   Wt (!) 325 lb 8 oz (147.6 kg)   SpO2 95%   BMI 52.14 kg/m   Physical Exam Constitutional:      Appearance: Normal appearance. She is not ill-appearing.  HENT:     Head: Normocephalic and atraumatic.     Right Ear: External ear normal.     Left Ear: External ear normal.  Eyes:     Conjunctiva/sclera: Conjunctivae normal.  Pulmonary:  Effort: Pulmonary effort is normal. No respiratory distress.  Neurological:     Mental Status: She is alert. Mental status is at baseline.  Psychiatric:        Mood and Affect: Mood normal.        Behavior: Behavior normal.        Thought Content: Thought content normal.        Judgment: Judgment normal.           Assessment & Plan:   Problem List Items Addressed This Visit      Cardiovascular and Mediastinum   Essential hypertension - Primary    BP elevated in setting of being off medication. Will reassess in 4-6 weeks after restarting coreg. If elevated or can tolerate will consider lisinopril for  prevention.       Relevant Medications   carvedilol (COREG) 25 MG tablet   Other Relevant Orders   Comprehensive metabolic panel   Hemoglobin A1c   Lipid Profile   LVH (left ventricular hypertrophy) due to hypertensive disease, without heart failure    Monitor for worsening symptoms. On ECHO in 2018. Work to control BP      Relevant Medications   carvedilol (COREG) 25 MG tablet     Endocrine   Poorly controlled type 2 diabetes mellitus ([place])    Will get labs but suspect poorly controlled with being out of medication. Encouraged taking medication and following diet. Cont to work on weight loss      Relevant Medications   liraglutide (VICTOZA) 18 MG/3ML SOPN   metFORMIN (GLUCOPHAGE) 500 MG tablet   Other Relevant Orders   Comprehensive metabolic panel   Hemoglobin A1c   Lipid Profile     Other   Chronic tachycardia    Restart coreg. Cont to monitor      Relevant Medications   carvedilol (COREG) 25 MG tablet   Generalized anxiety disorder with panic attacks    Discussed starting fluoxetine as sister responding well to this and was having side effects to escitalopram. Start with low dose and titrate up. Return in 6 weeks - will do gad/phq-9 at next office visit.       Relevant Medications   FLUoxetine (PROZAC) 20 MG tablet   Mild episode of recurrent major depressive disorder (HCC)   Relevant Medications   FLUoxetine (PROZAC) 20 MG tablet   Family history of heart attack    Several family members with early cardiac death and early MI. Plan to discuss possible referral to preventative cardiology. Likely needs a statin.           No follow-ups on file.  Lesleigh Noe, MD

## 2019-03-12 NOTE — Assessment & Plan Note (Signed)
Several family members with early cardiac death and early MI. Plan to discuss possible referral to preventative cardiology. Likely needs a statin.

## 2019-03-12 NOTE — Assessment & Plan Note (Signed)
Restart coreg. Cont to monitor

## 2019-03-12 NOTE — Assessment & Plan Note (Signed)
Will get labs but suspect poorly controlled with being out of medication. Encouraged taking medication and following diet. Cont to work on weight loss

## 2019-03-12 NOTE — Assessment & Plan Note (Signed)
Discussed starting fluoxetine as sister responding well to this and was having side effects to escitalopram. Start with low dose and titrate up. Return in 6 weeks - will do gad/phq-9 at next office visit.

## 2019-03-12 NOTE — Assessment & Plan Note (Signed)
Monitor for worsening symptoms. On ECHO in 2018. Work to control BP

## 2019-03-12 NOTE — Assessment & Plan Note (Addendum)
BP elevated in setting of being off medication. Will reassess in 4-6 weeks after restarting coreg. If elevated or can tolerate will consider lisinopril for prevention.

## 2019-03-13 ENCOUNTER — Ambulatory Visit: Payer: Managed Care, Other (non HMO) | Admitting: Family Medicine

## 2019-03-17 ENCOUNTER — Other Ambulatory Visit: Payer: Self-pay

## 2019-03-17 ENCOUNTER — Other Ambulatory Visit: Payer: Managed Care, Other (non HMO)

## 2019-04-23 ENCOUNTER — Ambulatory Visit: Payer: Managed Care, Other (non HMO) | Admitting: Family Medicine

## 2019-04-23 DIAGNOSIS — Z0289 Encounter for other administrative examinations: Secondary | ICD-10-CM

## 2019-06-06 ENCOUNTER — Other Ambulatory Visit: Payer: Self-pay | Admitting: Family Medicine

## 2019-06-06 DIAGNOSIS — E1165 Type 2 diabetes mellitus with hyperglycemia: Secondary | ICD-10-CM

## 2019-06-08 NOTE — Telephone Encounter (Signed)
Patient is over due for follow up. Please schedule when possible. Thank you

## 2019-06-10 NOTE — Telephone Encounter (Signed)
lvm asking pt to call office °

## 2019-06-12 NOTE — Telephone Encounter (Signed)
lvm asking pt to call office °

## 2019-06-16 ENCOUNTER — Encounter: Payer: Self-pay | Admitting: Family Medicine

## 2019-06-16 NOTE — Telephone Encounter (Signed)
Unable to reach patient. Mailed letter.

## 2019-10-15 ENCOUNTER — Telehealth: Payer: Self-pay | Admitting: *Deleted

## 2019-10-15 NOTE — Telephone Encounter (Signed)
Agreed. She has not had any blood work done. Needs in-person appointment

## 2019-10-15 NOTE — Telephone Encounter (Signed)
Pharmacy sent fax requesting 90 day supply on Fluoxetine 20 mg and Victoza.  Patient was last seen on 03/11/2020 to establish care.  She was to follow up on 4 weeks and no-showed that appointment.  I sent faxes back to pharmacy letting them know patient needs an appointment prior to refills.  Will forward to Dr. Einar Pheasant as Juluis Rainier in case she wants to do something different.

## 2019-12-02 ENCOUNTER — Ambulatory Visit: Admit: 2019-12-02 | Discharge status: Against Medical Advice | Payer: Managed Care, Other (non HMO)

## 2020-01-02 ENCOUNTER — Emergency Department: Payer: Managed Care, Other (non HMO)

## 2020-01-02 ENCOUNTER — Other Ambulatory Visit: Payer: Self-pay

## 2020-01-02 DIAGNOSIS — Z7984 Long term (current) use of oral hypoglycemic drugs: Secondary | ICD-10-CM | POA: Insufficient documentation

## 2020-01-02 DIAGNOSIS — J019 Acute sinusitis, unspecified: Secondary | ICD-10-CM | POA: Insufficient documentation

## 2020-01-02 DIAGNOSIS — R519 Headache, unspecified: Secondary | ICD-10-CM | POA: Diagnosis present

## 2020-01-02 DIAGNOSIS — E119 Type 2 diabetes mellitus without complications: Secondary | ICD-10-CM | POA: Insufficient documentation

## 2020-01-02 DIAGNOSIS — I10 Essential (primary) hypertension: Secondary | ICD-10-CM | POA: Insufficient documentation

## 2020-01-02 DIAGNOSIS — Z79899 Other long term (current) drug therapy: Secondary | ICD-10-CM | POA: Diagnosis not present

## 2020-01-02 DIAGNOSIS — Z87891 Personal history of nicotine dependence: Secondary | ICD-10-CM | POA: Insufficient documentation

## 2020-01-02 LAB — BASIC METABOLIC PANEL
Anion gap: 10 (ref 5–15)
BUN: 11 mg/dL (ref 6–20)
CO2: 26 mmol/L (ref 22–32)
Calcium: 8.9 mg/dL (ref 8.9–10.3)
Chloride: 99 mmol/L (ref 98–111)
Creatinine, Ser: 0.67 mg/dL (ref 0.44–1.00)
GFR calc Af Amer: >60 mL/min (ref >60)
GFR calc non Af Amer: >60 mL/min (ref >60)
Glucose, Bld: 211 mg/dL — ABNORMAL HIGH (ref 70–99)
Potassium: 4 mmol/L (ref 3.5–5.1)
Sodium: 135 mmol/L (ref 135–145)

## 2020-01-02 LAB — PREGNANCY, URINE: Preg Test, Ur: NEGATIVE

## 2020-01-02 LAB — POCT PREGNANCY, URINE: Preg Test, Ur: NEGATIVE

## 2020-01-02 NOTE — ED Notes (Signed)
Spoke with dr. Creig Hines regarding pt's  Presenting complaint, symptoms and need for orders, order for head ct, bmp and upt received.

## 2020-01-02 NOTE — ED Triage Notes (Signed)
Pt states she has had a headache for 4 days with left arm tingling. Pt states for last hour she has had left eye "fuzziness". Pt without obvious neuro deficit, speech clear, perrl 49mm, face symmetrical.

## 2020-01-03 ENCOUNTER — Emergency Department
Admission: EM | Admit: 2020-01-03 | Discharge: 2020-01-03 | Disposition: A | Discharge status: Home / Self Care | Payer: Managed Care, Other (non HMO) | Attending: Emergency Medicine | Admitting: Emergency Medicine

## 2020-01-03 DIAGNOSIS — J019 Acute sinusitis, unspecified: Secondary | ICD-10-CM

## 2020-01-03 DIAGNOSIS — R519 Headache, unspecified: Secondary | ICD-10-CM

## 2020-01-03 LAB — CBC WITH DIFFERENTIAL/PLATELET
Abs Immature Granulocytes: 0.03 10*3/uL (ref 0.00–0.07)
Basophils Absolute: 0.1 10*3/uL (ref 0.0–0.1)
Basophils Relative: 1 %
Eosinophils Absolute: 0.1 10*3/uL (ref 0.0–0.5)
Eosinophils Relative: 1 %
HCT: 36.2 % (ref 36.0–46.0)
Hemoglobin: 12.1 g/dL (ref 12.0–15.0)
Immature Granulocytes: 0 %
Lymphocytes Relative: 34 %
Lymphs Abs: 3.5 10*3/uL (ref 0.7–4.0)
MCH: 25.7 pg — ABNORMAL LOW (ref 26.0–34.0)
MCHC: 33.4 g/dL (ref 30.0–36.0)
MCV: 76.9 fL — ABNORMAL LOW (ref 80.0–100.0)
Monocytes Absolute: 1.1 10*3/uL — ABNORMAL HIGH (ref 0.1–1.0)
Monocytes Relative: 10 %
Neutro Abs: 5.6 10*3/uL (ref 1.7–7.7)
Neutrophils Relative %: 54 %
Platelets: 286 10*3/uL (ref 150–400)
RBC: 4.71 MIL/uL (ref 3.87–5.11)
RDW: 14.5 % (ref 11.5–15.5)
WBC: 10.2 10*3/uL (ref 4.0–10.5)
nRBC: 0 % (ref 0.0–0.2)

## 2020-01-03 MED ORDER — AZITHROMYCIN 250 MG PO TABS: ORAL_TABLET | ORAL | 0 refills | Status: DC

## 2020-01-03 MED ORDER — PROCHLORPERAZINE MALEATE 10 MG PO TABS: 10.0000 mg | ORAL_TABLET | Freq: Four times a day (QID) | ORAL | 0 refills | Status: DC | PRN

## 2020-01-03 MED ORDER — SODIUM CHLORIDE 0.9 % IV BOLUS
500.0000 mL | Freq: Once | INTRAVENOUS | Status: AC
  Administered 2020-01-03: 500 mL via INTRAVENOUS

## 2020-01-03 MED ORDER — PROCHLORPERAZINE EDISYLATE 10 MG/2ML IJ SOLN
5.0000 mg | Freq: Once | INTRAMUSCULAR | Status: AC
  Administered 2020-01-03: 5 mg via INTRAVENOUS
  Filled 2020-01-03: qty 2

## 2020-01-03 MED ORDER — DIPHENHYDRAMINE HCL 50 MG/ML IJ SOLN
25.0000 mg | Freq: Once | INTRAMUSCULAR | Status: AC
  Administered 2020-01-03: 25 mg via INTRAVENOUS
  Filled 2020-01-03: qty 1

## 2020-01-03 NOTE — Discharge Instructions (Signed)
1.  Take Z-Pak as prescribed. 2.  You may take Compazine as needed for headache and nausea (#20). 3.  Return to the ER for worsening symptoms, persistent vomiting, difficulty breathing or other concerns.

## 2020-01-03 NOTE — ED Provider Notes (Signed)
[facility] Emergency Department Provider Note   ____________________________________________   First MD Initiated Contact with Patient 01/03/20 0222     (approximate)  I have reviewed the triage vital signs and the nursing notes.   HISTORY  Chief Complaint Headache    HPI Lynn [name] is a 34 y.o. female who presents to the ED from home with a chief complaint of headache.  Patient reports frontal headache for the past 4 to 5 days.  Sometimes associated with left eye "fuzziness" and left arm tingling.  No visual changes or paresthesias currently.  Has not taking anything for the headache.  Was out of her Coreg for a week due to financial reasons and has recently restarted it.  Admits she has not had any diabetes medicines for "quite some time".  Had COVID-19 approximately 1 month ago status post IV antibiotic infusion.  Reports symptoms of cough and shortness of breath have cleared; now experiencing some sinus pressure and ear pain.  Denies fever, shortness of breath, abdominal pain, nausea, vomiting or dizziness.     Past Medical History:  Diagnosis Date  . Carpal tunnel syndrome, left   . Diabetes ([place])   . Hypertension   . Iron deficiency   . Panic attacks   . PCOS (polycystic ovarian syndrome)   . Tachycardia     Patient Active Problem List   Diagnosis Date Noted  . Essential hypertension 03/12/2019  . Chronic tachycardia 03/12/2019  . Poorly controlled type 2 diabetes mellitus ([place]) 03/12/2019  . Generalized anxiety disorder with panic attacks 03/12/2019  . Mild episode of recurrent major depressive disorder ([place]) 03/12/2019  . Family history of heart attack 03/12/2019  . LVH (left ventricular hypertrophy) due to hypertensive disease, without heart failure 03/12/2019    Past Surgical History:  Procedure Laterality Date  . ADENOIDECTOMY    . CESAREAN SECTION    . TONSILLECTOMY      Prior to Admission medications   Medication Sig  Start Date End Date Taking? Authorizing Provider  acetaminophen (TYLENOL) 325 MG tablet Take 650 mg by mouth every 4 (four) hours as needed.  07/10/16   [provider]  azithromycin (ZITHROMAX Z-PAK) 250 MG tablet Take 2 tablets day #1, then 1 tablet daily until finished 01/03/20   Paulette Blanch, MD  carvedilol (COREG) 25 MG tablet Take 1 tablet (25 mg total) by mouth 2 (two) times daily with a meal. 03/12/19   Lesleigh Noe, MD  ferrous sulfate 325 (65 FE) MG EC tablet Take 325 mg by mouth 3 (three) times daily with meals.    [provider]  FLUoxetine (PROZAC) 20 MG tablet Start with 1/2 tablet x 1 week. Then increase to 1 tablet per day. 03/12/19   Lesleigh Noe, MD  liraglutide (VICTOZA) 18 MG/3ML SOPN Inject 0.3 mLs (1.8 mg total) into the skin daily. 03/12/19   Lesleigh Noe, MD  metFORMIN (GLUCOPHAGE) 500 MG tablet TAKE 2 TABLETS(1000 MG) BY MOUTH TWICE DAILY WITH A MEAL 03/13/19   Lesleigh Noe, MD  prochlorperazine (COMPAZINE) 10 MG tablet Take 1 tablet (10 mg total) by mouth every 6 (six) hours as needed for nausea (headache). 01/03/20   Paulette Blanch, MD    Allergies Ibuprofen  Family History  Problem Relation Age of Onset  . Heart disease Mother   . Diabetes Mother   . Hypertension Mother   . Kidney disease Mother   . Heart failure Mother   . Heart  attack Mother 66  . Arthritis Mother   . Asthma Mother   . COPD Mother   . Depression Mother   . Hyperlipidemia Mother   . Mental illness Mother   . Heart disease Father   . Heart attack Father 55  . COPD Father   . Hyperlipidemia Father   . Hypertension Father   . Mental illness Father   . Arthritis Sister   . Depression Sister   . Diabetes Sister   . Hypertension Sister   . Mental illness Sister   . Miscarriages / Stillbirths Sister   . Heart failure Maternal Grandmother   . Arthritis Maternal Grandmother   . Alcohol abuse Maternal Grandmother   . COPD Maternal Grandmother   . Diabetes  Maternal Grandmother   . Hearing loss Maternal Grandmother   . Heart disease Maternal Grandmother   . Hyperlipidemia Maternal Grandmother   . Hypertension Maternal Grandmother   . Stroke Maternal Grandmother   . Miscarriages / Stillbirths Maternal Grandmother   . Mental illness Maternal Grandmother   . Heart attack Maternal Grandmother   . Heart failure Maternal Grandfather   . Alcohol abuse Maternal Grandfather   . Arthritis Maternal Grandfather   . Early death Maternal Grandfather   . Heart disease Maternal Grandfather   . Hyperlipidemia Maternal Grandfather   . Hypertension Maternal Grandfather   . Heart attack Maternal Grandfather 60  . Mental illness Maternal Grandfather   . Heart failure Paternal Grandmother   . Arthritis Paternal Grandmother   . Diabetes Paternal Grandmother   . Heart attack Paternal Grandmother   . Miscarriages / Stillbirths Paternal Grandmother   . Mental illness Paternal Grandmother   . Kidney disease Paternal Grandmother   . Intellectual disability Paternal Grandmother   . Hypertension Paternal Grandmother   . Hyperlipidemia Paternal Grandmother   . Heart disease Paternal Grandmother   . Heart failure Paternal Grandfather   . Arthritis Paternal Grandfather   . Heart attack Paternal Grandfather   . Mental illness Paternal Grandfather   . Hearing loss Paternal Grandfather   . Heart disease Paternal Grandfather   . Hyperlipidemia Paternal Grandfather   . Hypertension Paternal Grandfather   . Alcohol abuse Sister   . Depression Sister   . Diabetes Sister   . Heart disease Sister   . Heart attack Sister 26  . Arthritis Sister   . Kidney disease Sister   . Transient ischemic attack Sister     Social History Social History   Tobacco Use  . Smoking status: Former Smoker    Packs/day: 1.00    Years: 7.00    Pack years: 7.00    Types: Cigarettes    Quit date: 03/19/2014    Years since quitting: 5.7  . Smokeless tobacco: Never Used  Vaping  Use  . Vaping Use: Never used  Substance Use Topics  . Alcohol use: Yes    Comment: rarely  . Drug use: No    Review of Systems  Constitutional: No fever/chills Eyes: No visual changes. ENT: No sore throat. Cardiovascular: Denies chest pain. Respiratory: Denies shortness of breath. Gastrointestinal: No abdominal pain.  No nausea, no vomiting.  No diarrhea.  No constipation. Genitourinary: Negative for dysuria. Musculoskeletal: Negative for back pain. Skin: Negative for rash. Neurological: Positive for headache.  Negative for focal weakness or numbness.   ____________________________________________   PHYSICAL EXAM:  VITAL SIGNS: ED Triage Vitals  Enc Vitals Group     BP 01/02/20 2044 (!) 141/94  Pulse Rate 01/02/20 2044 88     Resp 01/02/20 2044 16     Temp 01/02/20 2044 98.1 F (36.7 C)     Temp Source 01/02/20 2345 Oral     SpO2 01/02/20 2044 100 %     Weight 01/02/20 2044 (!) 350 lb (158.8 kg)     Height 01/02/20 2044 5\' 7"  (1.702 m)     Head Circumference --      Peak Flow --      Pain Score 01/02/20 2044 7     Pain Loc --      Pain Edu? --      Excl. in [place]? --     Constitutional: Alert and oriented. Well appearing and in no acute distress. Eyes: Conjunctivae are normal. PERRL. EOMI. Head: Atraumatic.  Point tenderness to posterior scalp.  No vesicles or abscess noted.  Mild frontal sinus tenderness to palpation. Ears: Slight fluid behind both TMs. Nose: No congestion/rhinnorhea. Mouth/Throat: Mucous membranes are moist.   Neck: No stridor.  Supple neck without meningismus.  No carotid bruits. Cardiovascular: Normal rate, regular rhythm. Grossly normal heart sounds.  Good peripheral circulation. Respiratory: Normal respiratory effort.  No retractions. Lungs CTAB. Gastrointestinal: Soft and nontender. No distention. No abdominal bruits. No CVA tenderness. Musculoskeletal: No lower extremity tenderness nor edema.  No joint effusions. Neurologic: Alert  and oriented x3.  CN II-XII grossly intact.  Normal speech and language. No gross focal neurologic deficits are appreciated.  Skin:  Skin is warm, dry and intact. No rash noted.  No petechiae. Psychiatric: Mood and affect are normal. Speech and behavior are normal.  ____________________________________________   LABS (all labs ordered are listed, but only abnormal results are displayed)  Labs Reviewed  BASIC METABOLIC PANEL - Abnormal; Notable for the following components:      Result Value   Glucose, Bld 211 (*)    All other components within normal limits  CBC WITH DIFFERENTIAL/PLATELET - Abnormal; Notable for the following components:   MCV 76.9 (*)    MCH 25.7 (*)    Monocytes Absolute 1.1 (*)    All other components within normal limits  PREGNANCY, URINE  POCT PREGNANCY, URINE   ____________________________________________  EKG   ____________________________________________  RADIOLOGY I, Eesa Justiss J, personally viewed and evaluated these images (plain radiographs) as part of my medical decision making, as well as reviewing the written report by the radiologist.  ED MD interpretation: No ICH  Official radiology report(s): CT Head Wo Contrast  Result Date: 01/02/2020 CLINICAL DATA:  Headache. EXAM: CT HEAD WITHOUT CONTRAST TECHNIQUE: Contiguous axial images were obtained from the base of the skull through the vertex without intravenous contrast. COMPARISON:  None. FINDINGS: Brain: No evidence of acute infarction, hemorrhage, hydrocephalus, extra-axial collection or mass lesion/mass effect. Vascular: No hyperdense vessel or unexpected calcification. Skull: Normal. Negative for fracture or focal lesion. Sinuses/Orbits: No acute finding. Other: None. IMPRESSION: No acute intracranial pathology. Electronically Signed   By: Virgina Norfolk M.D.   On: 01/02/2020 21:36    ____________________________________________   PROCEDURES  Procedure(s) performed (including Critical  Care):  Procedures   ____________________________________________   INITIAL IMPRESSION / ASSESSMENT AND PLAN / ED COURSE  As part of my medical decision making, I reviewed the following data within the [place] History obtained from family, Nursing notes reviewed and incorporated, Labs reviewed, Old chart reviewed, Radiograph reviewed, Notes from prior ED visits and [place] Controlled Substance Database     34 year old female presenting with headache  and sinus congestion. Differential diagnosis includes, but is not limited to, intracranial hemorrhage, meningitis/encephalitis, previous head trauma, cavernous venous thrombosis, tension headache, temporal arteritis, migraine or migraine equivalent, idiopathic intracranial hypertension, and non-specific headache.  No focal neurological deficits on examination.  Will administer IV fluids, Compazine and Benadryl.  Will reassess.   Clinical Course as of Jan 03 524  Nancy Fetter Jan 03, 2020  0404 Patient feeling significantly better.  Updated her on CBC results.  Will discharge home with prescription for Compazine.  Will start Z-Pak for sinusitis.  Will discharge after completion of IV fluids.  Strict return precautions given.  Patient and friend verbalized understanding and agree with plan of care.   [JS]    Clinical Course User Index [JS] Paulette Blanch, MD     ____________________________________________   FINAL CLINICAL IMPRESSION(S) / ED DIAGNOSES  Final diagnoses:  Acute nonintractable headache, unspecified headache type  Acute sinusitis, recurrence not specified, unspecified location     ED Discharge Orders         Ordered    prochlorperazine (COMPAZINE) 10 MG tablet  Every 6 hours PRN        01/03/20 0406    azithromycin (ZITHROMAX Z-PAK) 250 MG tablet        01/03/20 0407          *Please note:  Darletta Noblett was evaluated in Emergency Department on 01/03/2020 for the symptoms described in the history of  present illness. She was evaluated in the context of the global COVID-19 pandemic, which necessitated consideration that the patient might be at risk for infection with the SARS-CoV-2 virus that causes COVID-19. Institutional protocols and algorithms that pertain to the evaluation of patients at risk for COVID-19 are in a state of rapid change based on information released by regulatory bodies including the CDC and federal and state organizations. These policies and algorithms were followed during the patient's care in the ED.  Some ED evaluations and interventions may be delayed as a result of limited staffing during and the pandemic.*   Note:  This document was prepared using Dragon voice recognition software and may include unintentional dictation errors.   Paulette Blanch, MD 01/03/20 [phone]

## 2020-01-03 NOTE — ED Notes (Signed)
Assumed care of pt upon being roomed, reports head pressure with left hand tingling. Denies vision changes but reports light and auditory sensitivity. AO x4. Visitor at bedside. Medicated per MAR.

## 2020-01-03 NOTE — ED Notes (Signed)
DC reviewed by RN and provider. Pt ambulated independently out of ED without concerns with family member. AO x4

## 2020-01-04 ENCOUNTER — Other Ambulatory Visit: Payer: Self-pay | Admitting: Family Medicine

## 2020-01-04 DIAGNOSIS — E1165 Type 2 diabetes mellitus with hyperglycemia: Secondary | ICD-10-CM

## 2020-01-05 ENCOUNTER — Telehealth: Payer: Self-pay | Admitting: Family Medicine

## 2020-01-05 NOTE — Telephone Encounter (Signed)
Called patient to schedule ER follow up. LVM to call back and schedule.

## 2020-02-24 ENCOUNTER — Emergency Department: Payer: Managed Care, Other (non HMO)

## 2020-02-24 ENCOUNTER — Emergency Department
Admission: EM | Admit: 2020-02-24 | Discharge: 2020-02-24 | Disposition: A | Discharge status: Home / Self Care | Payer: Managed Care, Other (non HMO) | Attending: Emergency Medicine | Admitting: Emergency Medicine

## 2020-02-24 ENCOUNTER — Other Ambulatory Visit: Payer: Self-pay

## 2020-02-24 ENCOUNTER — Encounter: Payer: Self-pay | Admitting: Emergency Medicine

## 2020-02-24 DIAGNOSIS — E119 Type 2 diabetes mellitus without complications: Secondary | ICD-10-CM | POA: Insufficient documentation

## 2020-02-24 DIAGNOSIS — R0602 Shortness of breath: Secondary | ICD-10-CM | POA: Diagnosis not present

## 2020-02-24 DIAGNOSIS — M79652 Pain in left thigh: Secondary | ICD-10-CM | POA: Diagnosis present

## 2020-02-24 DIAGNOSIS — Z7984 Long term (current) use of oral hypoglycemic drugs: Secondary | ICD-10-CM | POA: Diagnosis not present

## 2020-02-24 DIAGNOSIS — M7989 Other specified soft tissue disorders: Secondary | ICD-10-CM

## 2020-02-24 DIAGNOSIS — Z79899 Other long term (current) drug therapy: Secondary | ICD-10-CM | POA: Diagnosis not present

## 2020-02-24 DIAGNOSIS — I10 Essential (primary) hypertension: Secondary | ICD-10-CM | POA: Insufficient documentation

## 2020-02-24 DIAGNOSIS — M79605 Pain in left leg: Secondary | ICD-10-CM | POA: Diagnosis not present

## 2020-02-24 DIAGNOSIS — M79662 Pain in left lower leg: Secondary | ICD-10-CM

## 2020-02-24 DIAGNOSIS — Z87891 Personal history of nicotine dependence: Secondary | ICD-10-CM | POA: Diagnosis not present

## 2020-02-24 DIAGNOSIS — R079 Chest pain, unspecified: Secondary | ICD-10-CM | POA: Insufficient documentation

## 2020-02-24 LAB — COMPREHENSIVE METABOLIC PANEL
ALT: 26 U/L (ref 0–44)
AST: 24 U/L (ref 15–41)
Albumin: 3.6 g/dL (ref 3.5–5.0)
Alkaline Phosphatase: 70 U/L (ref 38–126)
Anion gap: 8 (ref 5–15)
BUN: 14 mg/dL (ref 6–20)
CO2: 28 mmol/L (ref 22–32)
Calcium: 8.6 mg/dL — ABNORMAL LOW (ref 8.9–10.3)
Chloride: 97 mmol/L — ABNORMAL LOW (ref 98–111)
Creatinine, Ser: 0.75 mg/dL (ref 0.44–1.00)
GFR, Estimated: >60 mL/min (ref >60)
Glucose, Bld: 271 mg/dL — ABNORMAL HIGH (ref 70–99)
Potassium: 3.9 mmol/L (ref 3.5–5.1)
Sodium: 133 mmol/L — ABNORMAL LOW (ref 135–145)
Total Bilirubin: 0.7 mg/dL (ref 0.3–1.2)
Total Protein: 7.5 g/dL (ref 6.5–8.1)

## 2020-02-24 LAB — CBC WITH DIFFERENTIAL/PLATELET
Abs Immature Granulocytes: 0.02 10*3/uL (ref 0.00–0.07)
Basophils Absolute: 0 10*3/uL (ref 0.0–0.1)
Basophils Relative: 0 %
Eosinophils Absolute: 0.1 10*3/uL (ref 0.0–0.5)
Eosinophils Relative: 1 %
HCT: 38.3 % (ref 36.0–46.0)
Hemoglobin: 12.6 g/dL (ref 12.0–15.0)
Immature Granulocytes: 0 %
Lymphocytes Relative: 23 %
Lymphs Abs: 1.7 10*3/uL (ref 0.7–4.0)
MCH: 26 pg (ref 26.0–34.0)
MCHC: 32.9 g/dL (ref 30.0–36.0)
MCV: 79.1 fL — ABNORMAL LOW (ref 80.0–100.0)
Monocytes Absolute: 0.6 10*3/uL (ref 0.1–1.0)
Monocytes Relative: 8 %
Neutro Abs: 5.1 10*3/uL (ref 1.7–7.7)
Neutrophils Relative %: 68 %
Platelets: 291 10*3/uL (ref 150–400)
RBC: 4.84 MIL/uL (ref 3.87–5.11)
RDW: 13.9 % (ref 11.5–15.5)
WBC: 7.5 10*3/uL (ref 4.0–10.5)
nRBC: 0 % (ref 0.0–0.2)

## 2020-02-24 LAB — TROPONIN I (HIGH SENSITIVITY)
Troponin I (High Sensitivity): 4 ng/L (ref <18)
Troponin I (High Sensitivity): 2 ng/L (ref <18)

## 2020-02-24 LAB — BRAIN NATRIURETIC PEPTIDE: B Natriuretic Peptide: 31.8 pg/mL (ref 0.0–100.0)

## 2020-02-24 LAB — POC URINE PREG, ED: Preg Test, Ur: NEGATIVE

## 2020-02-24 LAB — FIBRIN DERIVATIVES D-DIMER (ARMC ONLY): Fibrin derivatives D-dimer (ARMC): 125.84 ng/mL (FEU) (ref 0.00–499.00)

## 2020-02-24 NOTE — ED Provider Notes (Signed)
[facility] Emergency Department Provider Note   ____________________________________________   First MD Initiated Contact with Patient 02/24/20 1752     (approximate)  I have reviewed the triage vital signs and the nursing notes.   HISTORY  Chief Complaint Leg Pain and Shortness of Breath    HPI Lynn [name] is a 34 y.o. female who complains of left upper thigh pain getting worse with walking.  It started about 4 days ago.  She also has some achiness in her chest and some shortness of breath when she walks.  Pain is in the same distribution as the sartorius muscle and there is tenderness along the path of the sartorius muscle to palpation.  There is no bruising or swelling.         Past Medical History:  Diagnosis Date  . Carpal tunnel syndrome, left   . Diabetes ([place])   . Hypertension   . Iron deficiency   . Panic attacks   . PCOS (polycystic ovarian syndrome)   . Tachycardia     Patient Active Problem List   Diagnosis Date Noted  . Essential hypertension 03/12/2019  . Chronic tachycardia 03/12/2019  . Poorly controlled type 2 diabetes mellitus ([place]) 03/12/2019  . Generalized anxiety disorder with panic attacks 03/12/2019  . Mild episode of recurrent major depressive disorder ([place]) 03/12/2019  . Family history of heart attack 03/12/2019  . LVH (left ventricular hypertrophy) due to hypertensive disease, without heart failure 03/12/2019    Past Surgical History:  Procedure Laterality Date  . ADENOIDECTOMY    . CESAREAN SECTION    . TONSILLECTOMY      Prior to Admission medications   Medication Sig Start Date End Date Taking? Authorizing Provider  acetaminophen (TYLENOL) 325 MG tablet Take 650 mg by mouth every 4 (four) hours as needed.  07/10/16   [provider]  carvedilol (COREG) 25 MG tablet Take 1 tablet (25 mg total) by mouth 2 (two) times daily with a meal. 03/12/19   Lesleigh Noe, MD  ferrous sulfate 325 (65 FE)  MG EC tablet Take 325 mg by mouth 3 (three) times daily with meals.    [provider]  FLUoxetine (PROZAC) 20 MG tablet Start with 1/2 tablet x 1 week. Then increase to 1 tablet per day. 03/12/19   Lesleigh Noe, MD  liraglutide (VICTOZA) 18 MG/3ML SOPN Inject 0.3 mLs (1.8 mg total) into the skin daily. 03/12/19   Lesleigh Noe, MD  metFORMIN (GLUCOPHAGE) 500 MG tablet TAKE 2 TABLETS(1000 MG) BY MOUTH TWICE DAILY WITH A MEAL 03/13/19   Lesleigh Noe, MD  prochlorperazine (COMPAZINE) 10 MG tablet Take 1 tablet (10 mg total) by mouth every 6 (six) hours as needed for nausea (headache). 01/03/20   Paulette Blanch, MD    Allergies Ibuprofen  Family History  Problem Relation Age of Onset  . Heart disease Mother   . Diabetes Mother   . Hypertension Mother   . Kidney disease Mother   . Heart failure Mother   . Heart attack Mother 66  . Arthritis Mother   . Asthma Mother   . COPD Mother   . Depression Mother   . Hyperlipidemia Mother   . Mental illness Mother   . Heart disease Father   . Heart attack Father 53  . COPD Father   . Hyperlipidemia Father   . Hypertension Father   . Mental illness Father   . Arthritis Sister   . Depression  Sister   . Diabetes Sister   . Hypertension Sister   . Mental illness Sister   . Miscarriages / Stillbirths Sister   . Heart failure Maternal Grandmother   . Arthritis Maternal Grandmother   . Alcohol abuse Maternal Grandmother   . COPD Maternal Grandmother   . Diabetes Maternal Grandmother   . Hearing loss Maternal Grandmother   . Heart disease Maternal Grandmother   . Hyperlipidemia Maternal Grandmother   . Hypertension Maternal Grandmother   . Stroke Maternal Grandmother   . Miscarriages / Stillbirths Maternal Grandmother   . Mental illness Maternal Grandmother   . Heart attack Maternal Grandmother   . Heart failure Maternal Grandfather   . Alcohol abuse Maternal Grandfather   . Arthritis Maternal Grandfather   . Early death  Maternal Grandfather   . Heart disease Maternal Grandfather   . Hyperlipidemia Maternal Grandfather   . Hypertension Maternal Grandfather   . Heart attack Maternal Grandfather 60  . Mental illness Maternal Grandfather   . Heart failure Paternal Grandmother   . Arthritis Paternal Grandmother   . Diabetes Paternal Grandmother   . Heart attack Paternal Grandmother   . Miscarriages / Stillbirths Paternal Grandmother   . Mental illness Paternal Grandmother   . Kidney disease Paternal Grandmother   . Intellectual disability Paternal Grandmother   . Hypertension Paternal Grandmother   . Hyperlipidemia Paternal Grandmother   . Heart disease Paternal Grandmother   . Heart failure Paternal Grandfather   . Arthritis Paternal Grandfather   . Heart attack Paternal Grandfather   . Mental illness Paternal Grandfather   . Hearing loss Paternal Grandfather   . Heart disease Paternal Grandfather   . Hyperlipidemia Paternal Grandfather   . Hypertension Paternal Grandfather   . Alcohol abuse Sister   . Depression Sister   . Diabetes Sister   . Heart disease Sister   . Heart attack Sister 61  . Arthritis Sister   . Kidney disease Sister   . Transient ischemic attack Sister     Social History Social History   Tobacco Use  . Smoking status: Former Smoker    Packs/day: 1.00    Years: 7.00    Pack years: 7.00    Types: Cigarettes    Quit date: 03/19/2014    Years since quitting: 5.9  . Smokeless tobacco: Never Used  Vaping Use  . Vaping Use: Never used  Substance Use Topics  . Alcohol use: Yes    Comment: rarely  . Drug use: No    Review of Systems Currently Constitutional: No fever/chills Eyes: No visual changes. ENT: No sore throat. Cardiovascular: Denies chest pain. Respiratory: Denies shortness of breath. Gastrointestinal: No abdominal pain.  No nausea, no vomiting.  No diarrhea.  No constipation. Genitourinary: Negative for dysuria. Musculoskeletal: Negative for back  pain. Skin: Negative for rash. Neurological: Negative for headaches, focal weakness   ____________________________________________   PHYSICAL EXAM:  VITAL SIGNS: ED Triage Vitals  Enc Vitals Group     BP 02/24/20 1746 132/67     Pulse Rate 02/24/20 1746 85     Resp --      Temp --      Temp src --      SpO2 --      Weight 02/24/20 1503 (!) 335 lb (152 kg)     Height 02/24/20 1503 5\' 7"  (1.702 m)     Head Circumference --      Peak Flow --      Pain Score  02/24/20 1502 6     Pain Loc --      Pain Edu? --      Excl. in [place]? --     Constitutional: Alert and oriented. Well appearing and in no acute distress. Eyes: Conjunctivae are normal.  Head: Atraumatic. Nose: No congestion/rhinnorhea. Mouth/Throat: Mucous membranes are moist.  Oropharynx non-erythematous. Neck: No stridor.  Cardiovascular: Normal rate, regular rhythm. Grossly normal heart sounds.  Good peripheral circulation. Respiratory: Normal respiratory effort.  No retractions. Lungs CTAB. Gastrointestinal: Soft and nontender. No distention. No abdominal bruits.  Musculoskeletal: No lower extremity   edema.  There is tenderness as described in HPI Neurologic:  Normal speech and language. No gross focal neurologic deficits are appreciated.  Skin:  Skin is warm, dry and intact. No rash noted.   ____________________________________________   LABS (all labs ordered are listed, but only abnormal results are displayed)  Labs Reviewed  CBC WITH DIFFERENTIAL/PLATELET - Abnormal; Notable for the following components:      Result Value   MCV 79.1 (*)    All other components within normal limits  COMPREHENSIVE METABOLIC PANEL - Abnormal; Notable for the following components:   Sodium 133 (*)    Chloride 97 (*)    Glucose, Bld 271 (*)    Calcium 8.6 (*)    All other components within normal limits  BRAIN NATRIURETIC PEPTIDE  FIBRIN DERIVATIVES D-DIMER ([facility] ONLY)  POC URINE PREG, ED  TROPONIN I (HIGH SENSITIVITY)   TROPONIN I (HIGH SENSITIVITY)   ____________________________________________  EKG  EKG read interpreted by me shows normal sinus rhythm rate of 90 normal axis decreased amplitude of the QRS in the chest leads.  The EKG looks essentially the same as the EKG from 03/27/2017. ____________________________________________  RADIOLOGY Gertha Calkin, personally viewed and evaluated these images (plain radiographs) as part of my medical decision making, as well as reviewing the written report by the radiologist.  ED MD interpretation: Chest x-ray and belly x-ray read by radiology reviewed by me do not show any acute disease.  Ultrasound of the left leg does not show any DVT I reviewed all the films including ultrasound  Official radiology report(s): DG Chest 2 View  Result Date: 02/24/2020 CLINICAL DATA:  Shortness of breath, left upper thigh pain con worse with ambulation. Dizziness. EXAM: CHEST - 2 VIEW COMPARISON:  Chest x-ray 01/03/2017, CT chest 11/25/2016, chest x-ray 11/25/2016 FINDINGS: The heart size and mediastinal contours are unchanged. Slightly low lung volumes with compressive changes at the bases. No focal consolidation. No pulmonary edema. No pleural effusion. No pneumothorax. No acute osseous abnormality. IMPRESSION: No active cardiopulmonary disease. Electronically Signed   By: Iven Finn M.D.   On: 02/24/2020 15:26   DG Abdomen 1 View  Result Date: 02/24/2020 CLINICAL DATA:  Abdominal pain. Possible sartorius strain. Left thigh pain. EXAM: ABDOMEN - 1 VIEW COMPARISON:  None. FINDINGS: Bowel gas pattern is normal. No abnormal soft tissue calcifications. No abnormality of the pelvic bones or hips. IMPRESSION: Negative. Electronically Signed   By: Nelson Chimes M.D.   On: 02/24/2020 20:16   US Venous Img Lower Unilateral Left (DVT)  Result Date: 02/24/2020 CLINICAL DATA:  Left lower extremity pain and edema for past 4 days. Evaluate for DVT. EXAM: LEFT LOWER EXTREMITY  VENOUS DOPPLER ULTRASOUND TECHNIQUE: Gray-scale sonography with graded compression, as well as color Doppler and duplex ultrasound were performed to evaluate the lower extremity deep venous systems from the level of the common femoral vein  and including the common femoral, femoral, profunda femoral, popliteal and calf veins including the posterior tibial, peroneal and gastrocnemius veins when visible. The superficial great saphenous vein was also interrogated. Spectral Doppler was utilized to evaluate flow at rest and with distal augmentation maneuvers in the common femoral, femoral and popliteal veins. COMPARISON:  None. FINDINGS: Contralateral Common Femoral Vein: Respiratory phasicity is normal and symmetric with the symptomatic side. No evidence of thrombus. Normal compressibility. Common Femoral Vein: No evidence of thrombus. Normal compressibility, respiratory phasicity and response to augmentation. Saphenofemoral Junction: No evidence of thrombus. Normal compressibility and flow on color Doppler imaging. Profunda Femoral Vein: No evidence of thrombus. Normal compressibility and flow on color Doppler imaging. Femoral Vein: No evidence of thrombus. Normal compressibility, respiratory phasicity and response to augmentation. Popliteal Vein: No evidence of thrombus. Normal compressibility, respiratory phasicity and response to augmentation. Calf Veins: No evidence of thrombus. Normal compressibility and flow on color Doppler imaging. Superficial Great Saphenous Vein: No evidence of thrombus. Normal compressibility. Venous Reflux:  None. Other Findings:  None. IMPRESSION: No evidence of DVT within the left lower extremity. Electronically Signed   By: Sandi Mariscal M.D.   On: 02/24/2020 16:09    ____________________________________________   PROCEDURES  Procedure(s) performed (including Critical Care):  Procedures   ____________________________________________   INITIAL IMPRESSION / ASSESSMENT AND PLAN  / ED COURSE  Patient likely has strain of the sartorius muscle.  The pain is exactly along the path of this muscle.  I do not see anything else that is worrisome on her EKG chest x-ray belly films to include on the belly film avulsion of the sartorius.  Her D-dimer troponins and other lab tests are all essentially okay.  I will have her follow-up with her primary care doctor and cardiology just in case.  She is diabetic and overweight.  These may be enough to put her at risk in spite of her young age and female sex.              ____________________________________________   FINAL CLINICAL IMPRESSION(S) / ED DIAGNOSES  Final diagnoses:  Left leg pain     ED Discharge Orders    None      *Please note:  Cana Mignano was evaluated in Emergency Department on 02/24/2020 for the symptoms described in the history of present illness. She was evaluated in the context of the global COVID-19 pandemic, which necessitated consideration that the patient might be at risk for infection with the SARS-CoV-2 virus that causes COVID-19. Institutional protocols and algorithms that pertain to the evaluation of patients at risk for COVID-19 are in a state of rapid change based on information released by regulatory bodies including the CDC and federal and state organizations. These policies and algorithms were followed during the patient's care in the ED.  Some ED evaluations and interventions may be delayed as a result of limited staffing during and the pandemic.*   Note:  This document was prepared using Dragon voice recognition software and may include unintentional dictation errors.    Nena Polio, MD 02/24/20 2050

## 2020-02-24 NOTE — Discharge Instructions (Addendum)
I think the pain in your leg is from's pulling your sartorius muscle.  That is a muscle that curves around your leg in the same area that the pain is.  I would try Tylenol or Motrin for a few days to see if this helps.  Please return for any fever coughing worsening shortness of breath or any other problems.  Your chest x-ray, blood clot screening test, fluid overload screening test and your heart blood work all look good.  There is no sign of any infection right now either.  Please follow-up with your regular doctor in the next week or so.  Please also call Dr. Rockey Situ the cardiologist let him know you in the emergency room with some chest discomfort he should be out of see very rapidly next week to.  This is just in case.

## 2020-02-24 NOTE — ED Triage Notes (Signed)
Pt comes into the ED via POV c/o left upper thigh pain that has gotten worse with ambulation.  Pain started 4 days ago and now has progressed to include [facility] and chest discomfort. Pt also admits to some dizziness. Pt has even and unlabored respirations at this time and is in NAD.

## 2020-02-24 NOTE — ED Notes (Signed)
Pt states that tech said they were going to draw troponin but it was never done. This RN drew and set troponin at this time

## 2020-02-29 ENCOUNTER — Telehealth: Payer: Self-pay | Admitting: Cardiovascular Disease

## 2020-02-29 NOTE — Telephone Encounter (Signed)
Attempted to schedule.  LMOV to call office.  ° °

## 2020-02-29 NOTE — Telephone Encounter (Signed)
ED Discharged   02/24/2020 (3 hours) [place]  Nena Polio, MD Last attending . Treatment team  Left leg pain +2 more Clinical impression  Leg Pain. Shortness of Breath Chief complaint  ED Provider Notes Nena Polio, MD (Physician) . [facility] Emergency Department Provider Note   ____________________________________________   First MD Initiated Contact with Patient 02/24/20 1752     (approximate)  I have reviewed the triage vital signs and the nursing notes.   HISTORY  Chief Complaint Leg Pain and Shortness of Breath

## 2020-03-02 ENCOUNTER — Other Ambulatory Visit: Payer: Self-pay | Admitting: Family Medicine

## 2020-03-02 DIAGNOSIS — I10 Essential (primary) hypertension: Secondary | ICD-10-CM

## 2020-03-02 DIAGNOSIS — R000 Tachycardia, unspecified: Secondary | ICD-10-CM

## 2020-03-02 MED ORDER — CARVEDILOL 25 MG PO TABS: ORAL_TABLET | ORAL | 0 refills | Status: DC

## 2020-03-02 NOTE — Telephone Encounter (Signed)
Received fax from [facility] stating insurance requires a 90 day supply.  Ok to refill for #180?

## 2020-03-02 NOTE — Telephone Encounter (Signed)
Please schedule pt for her CPE. I sent in a one month supply of her Coreg. Thank you!

## 2020-03-03 NOTE — Telephone Encounter (Signed)
Called patient and LVM to schedule cpe, as per DPR.

## 2020-03-04 ENCOUNTER — Ambulatory Visit: Payer: Managed Care, Other (non HMO) | Admitting: Internal Medicine

## 2020-03-04 NOTE — Progress Notes (Deleted)
Follow-up Outpatient Visit Date: 03/04/2020  Primary Care Provider: Lesleigh Noe, [place] [id number]  Chief Complaint: ***  HPI:  Lynn [name] is a 34 y.o. female with history of hypertension, diabetes mellitus, iron deficiency anemia, and PCOS, who presents for evaluation of leg pain/swelling, shortness of breath, and chest pain following recent ED visit.  She was previously seen in our office by Dr. Fletcher Anon, most recently in 03/2017.  She presented to the [facility] emergency department on 02/24/2020 with the aforementioned complaints.  It was thought that her symptoms were due to strain of the sartorius muscle.  She was advised to follow-up with [place] "just in case."  --------------------------------------------------------------------------------------------------  Past Medical History:  Diagnosis Date  . Carpal tunnel syndrome, left   . Diabetes ([place])   . Hypertension   . Iron deficiency   . Panic attacks   . PCOS (polycystic ovarian syndrome)   . Tachycardia    Past Surgical History:  Procedure Laterality Date  . ADENOIDECTOMY    . CESAREAN SECTION    . TONSILLECTOMY      No outpatient medications have been marked as taking for the 03/04/20 encounter (Appointment) with Laterrian Hevener, Harrell Gave, MD.    Allergies: Ibuprofen  Social History   Tobacco Use  . Smoking status: Former Smoker    Packs/day: 1.00    Years: 7.00    Pack years: 7.00    Types: Cigarettes    Quit date: 03/19/2014    Years since quitting: 5.9  . Smokeless tobacco: Never Used  Vaping Use  . Vaping Use: Never used  Substance Use Topics  . Alcohol use: Yes    Comment: rarely  . Drug use: No    Family History  Problem Relation Age of Onset  . Heart disease Mother   . Diabetes Mother   . Hypertension Mother   . Kidney disease Mother   . Heart failure Mother   . Heart attack Mother 3  . Arthritis Mother   . Asthma Mother   . COPD Mother   . Depression Mother   .  Hyperlipidemia Mother   . Mental illness Mother   . Heart disease Father   . Heart attack Father 61  . COPD Father   . Hyperlipidemia Father   . Hypertension Father   . Mental illness Father   . Arthritis Sister   . Depression Sister   . Diabetes Sister   . Hypertension Sister   . Mental illness Sister   . Miscarriages / Stillbirths Sister   . Heart failure Maternal Grandmother   . Arthritis Maternal Grandmother   . Alcohol abuse Maternal Grandmother   . COPD Maternal Grandmother   . Diabetes Maternal Grandmother   . Hearing loss Maternal Grandmother   . Heart disease Maternal Grandmother   . Hyperlipidemia Maternal Grandmother   . Hypertension Maternal Grandmother   . Stroke Maternal Grandmother   . Miscarriages / Stillbirths Maternal Grandmother   . Mental illness Maternal Grandmother   . Heart attack Maternal Grandmother   . Heart failure Maternal Grandfather   . Alcohol abuse Maternal Grandfather   . Arthritis Maternal Grandfather   . Early death Maternal Grandfather   . Heart disease Maternal Grandfather   . Hyperlipidemia Maternal Grandfather   . Hypertension Maternal Grandfather   . Heart attack Maternal Grandfather 60  . Mental illness Maternal Grandfather   . Heart failure Paternal Grandmother   . Arthritis Paternal Grandmother   .  Diabetes Paternal Grandmother   . Heart attack Paternal Grandmother   . Miscarriages / Stillbirths Paternal Grandmother   . Mental illness Paternal Grandmother   . Kidney disease Paternal Grandmother   . Intellectual disability Paternal Grandmother   . Hypertension Paternal Grandmother   . Hyperlipidemia Paternal Grandmother   . Heart disease Paternal Grandmother   . Heart failure Paternal Grandfather   . Arthritis Paternal Grandfather   . Heart attack Paternal Grandfather   . Mental illness Paternal Grandfather   . Hearing loss Paternal Grandfather   . Heart disease Paternal Grandfather   . Hyperlipidemia Paternal Grandfather    . Hypertension Paternal Grandfather   . Alcohol abuse Sister   . Depression Sister   . Diabetes Sister   . Heart disease Sister   . Heart attack Sister 29  . Arthritis Sister   . Kidney disease Sister   . Transient ischemic attack Sister     Review of Systems: A 12-system review of systems was performed and was negative except as noted in the HPI.  --------------------------------------------------------------------------------------------------  Physical Exam: There were no vitals taken for this visit.  General:  *** HEENT: No conjunctival pallor or scleral icterus. Facemask in place. Neck: Supple without lymphadenopathy, thyromegaly, JVD, or HJR. Lungs: Normal work of breathing. Clear to auscultation bilaterally without wheezes or crackles. Heart: Regular rate and rhythm without murmurs, rubs, or gallops. Non-displaced PMI. Abd: Bowel sounds present. Soft, NT/ND without hepatosplenomegaly Ext: No lower extremity edema. Radial, PT, and DP pulses are 2+ bilaterally. Skin: Warm and dry without rash.  EKG:  ***  Lab Results  Component Value Date   WBC 7.5 02/24/2020   HGB 12.6 02/24/2020   HCT 38.3 02/24/2020   MCV 79.1 (L) 02/24/2020   PLT 291 02/24/2020    Lab Results  Component Value Date   NA 133 (L) 02/24/2020   K 3.9 02/24/2020   CL 97 (L) 02/24/2020   CO2 28 02/24/2020   BUN 14 02/24/2020   CREATININE 0.75 02/24/2020   GLUCOSE 271 (H) 02/24/2020   ALT 26 02/24/2020    No results found for: CHOL, HDL, LDLCALC, LDLDIRECT, TRIG, CHOLHDL  --------------------------------------------------------------------------------------------------  ASSESSMENT AND PLAN: ***  Nelva Bush, MD 03/04/2020 6:50 AM

## 2020-03-10 NOTE — Telephone Encounter (Signed)
Called patient. LVM to call back and schedule. Letter sent.

## 2020-03-14 ENCOUNTER — Ambulatory Visit: Payer: Managed Care, Other (non HMO) | Admitting: Cardiology

## 2020-03-15 ENCOUNTER — Encounter: Payer: Self-pay | Admitting: Cardiology

## 2020-09-18 ENCOUNTER — Other Ambulatory Visit: Payer: Self-pay | Admitting: Family Medicine

## 2020-09-18 DIAGNOSIS — I10 Essential (primary) hypertension: Secondary | ICD-10-CM

## 2020-09-18 DIAGNOSIS — R000 Tachycardia, unspecified: Secondary | ICD-10-CM

## 2020-10-13 ENCOUNTER — Ambulatory Visit: Payer: Managed Care, Other (non HMO) | Admitting: Family Medicine

## 2020-10-13 ENCOUNTER — Other Ambulatory Visit: Payer: Self-pay

## 2020-10-13 VITALS — BP 122/70 | HR 92 | Temp 98.2°F | Ht 67.0 in | Wt 307.5 lb

## 2020-10-13 DIAGNOSIS — Z1159 Encounter for screening for other viral diseases: Secondary | ICD-10-CM

## 2020-10-13 DIAGNOSIS — D649 Anemia, unspecified: Secondary | ICD-10-CM | POA: Diagnosis not present

## 2020-10-13 DIAGNOSIS — I10 Essential (primary) hypertension: Secondary | ICD-10-CM | POA: Diagnosis not present

## 2020-10-13 DIAGNOSIS — F411 Generalized anxiety disorder: Secondary | ICD-10-CM

## 2020-10-13 DIAGNOSIS — Z23 Encounter for immunization: Secondary | ICD-10-CM

## 2020-10-13 DIAGNOSIS — F410 Panic disorder [episodic paroxysmal anxiety] without agoraphobia: Secondary | ICD-10-CM

## 2020-10-13 DIAGNOSIS — E1165 Type 2 diabetes mellitus with hyperglycemia: Secondary | ICD-10-CM | POA: Diagnosis not present

## 2020-10-13 DIAGNOSIS — R000 Tachycardia, unspecified: Secondary | ICD-10-CM

## 2020-10-13 DIAGNOSIS — F330 Major depressive disorder, recurrent, mild: Secondary | ICD-10-CM

## 2020-10-13 DIAGNOSIS — F325 Major depressive disorder, single episode, in full remission: Secondary | ICD-10-CM

## 2020-10-13 LAB — LIPID PANEL
Cholesterol: 152 mg/dL (ref 0–200)
HDL: 38.6 mg/dL — ABNORMAL LOW (ref >39.00)
LDL Cholesterol: 76 mg/dL (ref 0–99)
NonHDL: 113.33
Total CHOL/HDL Ratio: 4
Triglycerides: 188 mg/dL — ABNORMAL HIGH (ref 0.0–149.0)
VLDL: 37.6 mg/dL (ref 0.0–40.0)

## 2020-10-13 LAB — COMPREHENSIVE METABOLIC PANEL
ALT: 31 U/L (ref 0–35)
AST: 39 U/L — ABNORMAL HIGH (ref 0–37)
Albumin: 4.2 g/dL (ref 3.5–5.2)
Alkaline Phosphatase: 54 U/L (ref 39–117)
BUN: 16 mg/dL (ref 6–23)
CO2: 27 mEq/L (ref 19–32)
Calcium: 9.2 mg/dL (ref 8.4–10.5)
Chloride: 100 mEq/L (ref 96–112)
Creatinine, Ser: 0.74 mg/dL (ref 0.40–1.20)
GFR: 106.45 mL/min (ref >60.00)
Glucose, Bld: 134 mg/dL — ABNORMAL HIGH (ref 70–99)
Potassium: 4 mEq/L (ref 3.5–5.1)
Sodium: 136 mEq/L (ref 135–145)
Total Bilirubin: 0.5 mg/dL (ref 0.2–1.2)
Total Protein: 7.5 g/dL (ref 6.0–8.3)

## 2020-10-13 LAB — CBC
HCT: 38.2 % (ref 36.0–46.0)
Hemoglobin: 13.1 g/dL (ref 12.0–15.0)
MCHC: 34.2 g/dL (ref 30.0–36.0)
MCV: 78.7 fl (ref 78.0–100.0)
Platelets: 266 10*3/uL (ref 150.0–400.0)
RBC: 4.86 Mil/uL (ref 3.87–5.11)
RDW: 14.2 % (ref 11.5–15.5)
WBC: 7.8 10*3/uL (ref 4.0–10.5)

## 2020-10-13 LAB — FERRITIN: Ferritin: 22.2 ng/mL (ref 10.0–291.0)

## 2020-10-13 LAB — MICROALBUMIN / CREATININE URINE RATIO
Creatinine,U: 136.4 mg/dL
Microalb Creat Ratio: 0.6 mg/g (ref 0.0–30.0)
Microalb, Ur: 0.8 mg/dL (ref 0.0–1.9)

## 2020-10-13 LAB — POCT GLYCOSYLATED HEMOGLOBIN (HGB A1C): Hemoglobin A1C: 7 % — AB (ref 4.0–5.6)

## 2020-10-13 MED ORDER — OZEMPIC (0.25 OR 0.5 MG/DOSE) 2 MG/1.5ML ~~LOC~~ SOPN: 0.2500 mg | PEN_INJECTOR | SUBCUTANEOUS | 1 refills | Status: DC

## 2020-10-13 MED ORDER — FLUOXETINE HCL 10 MG PO TABS: 10.0000 mg | ORAL_TABLET | Freq: Every day | ORAL | 3 refills | Status: DC

## 2020-10-13 MED ORDER — CARVEDILOL 25 MG PO TABS: 25.0000 mg | ORAL_TABLET | Freq: Two times a day (BID) | ORAL | 3 refills | Status: DC

## 2020-10-13 NOTE — Addendum Note (Signed)
Addended by: Loreen Freud on: 10/13/2020 12:39 PM   Modules accepted: Orders

## 2020-10-13 NOTE — Assessment & Plan Note (Signed)
Elevated due to out of meds. Restart carvedilol 25 mg bid.

## 2020-10-13 NOTE — Assessment & Plan Note (Signed)
Controlled likely 2/2 to weight loss. Cont carvedilol and reassess at next visit.

## 2020-10-13 NOTE — Assessment & Plan Note (Signed)
Start ozempic. Already done 50 lbs with diet changes.

## 2020-10-13 NOTE — Progress Notes (Signed)
Subjective:     Lynn [name] is a 35 y.o. female presenting for Medication Refill (Has not been taking medications x 1 month ) and Follow-up (Anx/Depression)     Medication Refill    #Diabetes Currently taking not currently taking medication   Using medications without difficulties: No  Feet problems:No  Blood Sugars averaging: does not check Last HgbA1c:  Lab Results  Component Value Date   HGBA1C 7.0 (A) 10/13/2020   Metformin - cannot tolerated due to side effects Feels the victoza was helping   Lost 20 lbs this month - has low carb diet <50 carbs per day Exercising 2-3 times a week    Diabetes Health Maintenance Due:    Diabetes Health Maintenance Due  Topic Date Due   OPHTHALMOLOGY EXAM  Never done   URINE MICROALBUMIN  Never done   HEMOGLOBIN A1C  04/15/2021   FOOT EXAM  10/13/2021   #Tachycardia - worse off medication - no cp, sob -   #depression/anxiety - ran out of medication 1 month ago - anxiety is more situational - is happier and life is better - family hx of both  - night shift - dreads going to work   Review of Systems   Social History   Tobacco Use  Smoking Status Former   Packs/day: 1.00   Years: 7.00   Pack years: 7.00   Types: Cigarettes   Quit date: 03/19/2014   Years since quitting: 6.5  Smokeless Tobacco Never        Objective:    BP Readings from Last 3 Encounters:  10/13/20 122/70  02/24/20 130/74  01/03/20 (!) 142/87   Wt Readings from Last 3 Encounters:  10/13/20 (!) 307 lb 8 oz (139.5 kg)  02/24/20 (!) 335 lb (152 kg)  01/02/20 (!) 350 lb (158.8 kg)    BP 122/70   Pulse 92   Temp 98.2 F (36.8 C) (Temporal)   Ht 5\' 7"  (1.702 m)   Wt (!) 307 lb 8 oz (139.5 kg)   LMP  (LMP Unknown)   SpO2 98%   BMI 48.16 kg/m    Physical Exam Constitutional:      General: She is not in acute distress.    Appearance: She is well-developed. She is not diaphoretic.  HENT:     Right Ear: External ear normal.      Left Ear: External ear normal.  Eyes:     Conjunctiva/sclera: Conjunctivae normal.  Cardiovascular:     Rate and Rhythm: Normal rate and regular rhythm.     Heart sounds: No murmur heard. Pulmonary:     Effort: Pulmonary effort is normal. No respiratory distress.     Breath sounds: Normal breath sounds. No wheezing.  Musculoskeletal:     Cervical back: Neck supple.  Skin:    General: Skin is warm and dry.     Capillary Refill: Capillary refill takes less than 2 seconds.  Neurological:     Mental Status: She is alert. Mental status is at baseline.  Psychiatric:        Mood and Affect: Mood normal.        Behavior: Behavior normal.   Diabetic Foot Exam - Simple   Simple Foot Form Diabetic Foot exam was performed with the following findings: Yes 10/13/2020 11:54 AM  Visual Inspection No deformities, no ulcerations, no other skin breakdown bilaterally: Yes Sensation Testing Intact to touch and monofilament testing bilaterally: Yes Pulse Check Posterior Tibialis and Dorsalis pulse intact bilaterally: Yes  Comments    Depression screen [facility] 2/9 10/13/2020  Decreased Interest 0  Down, Depressed, Hopeless 0  PHQ - 2 Score 0  Altered sleeping 1  Tired, decreased energy 0  Change in appetite 0  Feeling bad or failure about yourself  1  Trouble concentrating 0  Moving slowly or fidgety/restless 0  Suicidal thoughts 0  PHQ-9 Score 2  Difficult doing work/chores Somewhat difficult   GAD 7 : Generalized Anxiety Score 10/13/2020  Nervous, Anxious, on Edge 2  Control/stop worrying 1  Worry too much - different things 2  Trouble relaxing 2  Restless 1  Easily annoyed or irritable 2  Afraid - awful might happen 2  Total GAD 7 Score 12  Anxiety Difficulty Somewhat difficult           Assessment & Plan:   Problem List Items Addressed This Visit       Cardiovascular and Mediastinum   Essential hypertension    Controlled likely 2/2 to weight loss. Cont carvedilol and  reassess at next visit.        Relevant Medications   carvedilol (COREG) 25 MG tablet   Other Relevant Orders   Comprehensive metabolic panel     Endocrine   Poorly controlled type 2 diabetes mellitus ([place]) - Primary    Lab Results  Component Value Date   HGBA1C 7.0 (A) 10/13/2020  Stable with weight loss 50lbs and low carb diet. Pt very motivated to lose weight and did not tolerate metformin 2/2 to GI side effects. Start ozempic for weight loss and diabetes control. Encouraged eye exam. Urine and foot today       Relevant Medications   Semaglutide,0.25 or 0.5MG /DOS, (OZEMPIC, 0.25 OR 0.5 MG/DOSE,) 2 MG/1.5ML SOPN   Other Relevant Orders   POCT glycosylated hemoglobin (Hb A1C) (Completed)   Comprehensive metabolic panel   Lipid panel   Microalbumin / creatinine urine ratio     Other   Chronic tachycardia    Elevated due to out of meds. Restart carvedilol 25 mg bid.        Relevant Medications   carvedilol (COREG) 25 MG tablet   Generalized anxiety disorder with panic attacks    Worsening in setting of stopping medication. Restart fluoxetine 10 mg daily. Return if symptoms do not improve       Relevant Medications   FLUoxetine (PROZAC) 10 MG tablet   Depression, major, single episode, complete remission ([place])    Doing well even off medication. But will restart due anxiety. Continue fluoxetine 10 mg       Relevant Medications   FLUoxetine (PROZAC) 10 MG tablet   Other Visit Diagnoses     Anemia, unspecified type       Relevant Orders   CBC   Ferritin   Need for hepatitis C screening test       Relevant Orders   Hepatitis C antibody        Return in about 3 months (around 01/13/2021) for diabetes.  Lesleigh Noe, MD  This visit occurred during the SARS-CoV-2 public health emergency.  Safety protocols were in place, including screening questions prior to the visit, additional usage of staff PPE, and extensive cleaning of exam room while observing  appropriate contact time as indicated for disinfecting solutions.

## 2020-10-13 NOTE — Assessment & Plan Note (Signed)
Doing well even off medication. But will restart due anxiety. Continue fluoxetine 10 mg

## 2020-10-13 NOTE — Assessment & Plan Note (Signed)
Worsening in setting of stopping medication. Restart fluoxetine 10 mg daily. Return if symptoms do not improve

## 2020-10-13 NOTE — Assessment & Plan Note (Signed)
Lab Results  Component Value Date   HGBA1C 7.0 (A) 10/13/2020  Stable with weight loss 50lbs and low carb diet. Pt very motivated to lose weight and did not tolerate metformin 2/2 to GI side effects. Start ozempic for weight loss and diabetes control. Encouraged eye exam. Urine and foot today

## 2020-10-13 NOTE — Patient Instructions (Signed)
Diabetes - start ozempic - Mychart or call in 3 weeks to let me know if tolerating and can plan to increase dose

## 2020-10-14 ENCOUNTER — Telehealth: Payer: Self-pay

## 2020-10-14 DIAGNOSIS — E1165 Type 2 diabetes mellitus with hyperglycemia: Secondary | ICD-10-CM

## 2020-10-14 LAB — HEPATITIS C ANTIBODY
Hepatitis C Ab: NONREACTIVE
SIGNAL TO CUT-OFF: 0.02 (ref <1.00)

## 2020-10-14 MED ORDER — OZEMPIC (0.25 OR 0.5 MG/DOSE) 2 MG/1.5ML ~~LOC~~ SOPN: 0.2500 mg | PEN_INJECTOR | SUBCUTANEOUS | 0 refills | Status: DC

## 2020-10-14 NOTE — Telephone Encounter (Signed)
Patient called stating that she checked with her insurance and they need RX for Ozempic to be changed to 90 day supply for them to cover it. Please resend. Thank you

## 2020-10-14 NOTE — Telephone Encounter (Signed)
[facility] called they need a dosing change on the [facility] and a new rx sent

## 2020-10-14 NOTE — Telephone Encounter (Signed)
90 day supply sent to pharmacy

## 2020-10-17 NOTE — Telephone Encounter (Signed)
Spoke with [facility] pharmacist, they were able to fill Ozempic 0.25 mg for 30 day supply and for the next month RX would just need to say directions of 0.5 mg once weekly and for 90 day supply it would 4.5 ml. Patient does not need 90 day supply for the first RX because of dosing change. Patient advised and aware RX is ready for pick up.

## 2020-11-26 ENCOUNTER — Encounter: Payer: Self-pay | Admitting: Family Medicine

## 2020-11-26 DIAGNOSIS — E1165 Type 2 diabetes mellitus with hyperglycemia: Secondary | ICD-10-CM

## 2020-11-28 MED ORDER — OZEMPIC (0.25 OR 0.5 MG/DOSE) 2 MG/1.5ML ~~LOC~~ SOPN: 0.5000 mg | PEN_INJECTOR | SUBCUTANEOUS | 0 refills | Status: DC

## 2021-01-12 ENCOUNTER — Ambulatory Visit: Payer: Managed Care, Other (non HMO) | Admitting: Family Medicine

## 2021-03-09 ENCOUNTER — Other Ambulatory Visit: Payer: Self-pay

## 2021-03-09 ENCOUNTER — Encounter (HOSPITAL_COMMUNITY): Payer: Self-pay | Admitting: Emergency Medicine

## 2021-03-09 ENCOUNTER — Emergency Department (HOSPITAL_COMMUNITY)
Admission: EM | Admit: 2021-03-09 | Discharge: 2021-03-10 | Disposition: A | Discharge status: Home / Self Care | Payer: Managed Care, Other (non HMO) | Attending: Emergency Medicine | Admitting: Emergency Medicine

## 2021-03-09 DIAGNOSIS — Z87891 Personal history of nicotine dependence: Secondary | ICD-10-CM | POA: Insufficient documentation

## 2021-03-09 DIAGNOSIS — X58XXXA Exposure to other specified factors, initial encounter: Secondary | ICD-10-CM | POA: Insufficient documentation

## 2021-03-09 DIAGNOSIS — E119 Type 2 diabetes mellitus without complications: Secondary | ICD-10-CM | POA: Diagnosis not present

## 2021-03-09 DIAGNOSIS — S29012A Strain of muscle and tendon of back wall of thorax, initial encounter: Secondary | ICD-10-CM | POA: Insufficient documentation

## 2021-03-09 DIAGNOSIS — R0789 Other chest pain: Secondary | ICD-10-CM | POA: Insufficient documentation

## 2021-03-09 DIAGNOSIS — R519 Headache, unspecified: Secondary | ICD-10-CM | POA: Insufficient documentation

## 2021-03-09 DIAGNOSIS — T148XXA Other injury of unspecified body region, initial encounter: Secondary | ICD-10-CM

## 2021-03-09 DIAGNOSIS — Z7982 Long term (current) use of aspirin: Secondary | ICD-10-CM | POA: Diagnosis not present

## 2021-03-09 DIAGNOSIS — I10 Essential (primary) hypertension: Secondary | ICD-10-CM | POA: Insufficient documentation

## 2021-03-09 DIAGNOSIS — S299XXA Unspecified injury of thorax, initial encounter: Secondary | ICD-10-CM | POA: Diagnosis present

## 2021-03-09 LAB — CBC WITH DIFFERENTIAL/PLATELET
Abs Immature Granulocytes: 0.03 10*3/uL (ref 0.00–0.07)
Basophils Absolute: 0 10*3/uL (ref 0.0–0.1)
Basophils Relative: 1 %
Eosinophils Absolute: 0.1 10*3/uL (ref 0.0–0.5)
Eosinophils Relative: 1 %
HCT: 35.4 % — ABNORMAL LOW (ref 36.0–46.0)
Hemoglobin: 11.5 g/dL — ABNORMAL LOW (ref 12.0–15.0)
Immature Granulocytes: 0 %
Lymphocytes Relative: 31 %
Lymphs Abs: 2.6 10*3/uL (ref 0.7–4.0)
MCH: 26.9 pg (ref 26.0–34.0)
MCHC: 32.5 g/dL (ref 30.0–36.0)
MCV: 82.7 fL (ref 80.0–100.0)
Monocytes Absolute: 0.7 10*3/uL (ref 0.1–1.0)
Monocytes Relative: 9 %
Neutro Abs: 4.8 10*3/uL (ref 1.7–7.7)
Neutrophils Relative %: 58 %
Platelets: 283 10*3/uL (ref 150–400)
RBC: 4.28 MIL/uL (ref 3.87–5.11)
RDW: 13.3 % (ref 11.5–15.5)
WBC: 8.3 10*3/uL (ref 4.0–10.5)
nRBC: 0 % (ref 0.0–0.2)

## 2021-03-09 NOTE — ED Provider Notes (Signed)
Emergency Medicine Provider Triage Evaluation Note  Lynn [name] , a 35 y.o. female  was evaluated in triage.  Pt complains of left-sided chest pain with left side diffuse body numbness x5 days.  Somewhat improves with rest, now with concern for enlarged lymph nodes only on the left side of her body.  No recent illness, no recent cat bites.  No other complaints or concerns.  Review of Systems  Positive: Left-sided chest pain, left-sided body numbness Negative: Fevers, cough  Physical Exam  BP 135/82 (BP Location: Right Wrist)   Pulse 75   Temp 98.3 F (36.8 C) (Oral)   Resp 15   SpO2 100%  Gen:   Awake, no distress   Resp:  Normal effort  MSK:   Moves extremities without difficulty  Other:  Lungs CTA  Medical Decision Making  Medically screening exam initiated at 11:26 PM.  Appropriate orders placed.  [name] was informed that the remainder of the evaluation will be completed by another provider, this initial triage assessment does not replace that evaluation, and the importance of remaining in the ED until their evaluation is complete.     Tacy Learn, PA-C 03/09/21 2327    Fatima Blank, MD 03/10/21 [phone]

## 2021-03-09 NOTE — ED Triage Notes (Signed)
Patient here with chest pain for the last few days.  Patient states that she has been having some episodes of numbness on the left side, sometimes coming and going, some times staying. She states that her lymph nodes on the left side are inflamed.

## 2021-03-10 ENCOUNTER — Emergency Department (HOSPITAL_COMMUNITY): Payer: Managed Care, Other (non HMO)

## 2021-03-10 DIAGNOSIS — S29012A Strain of muscle and tendon of back wall of thorax, initial encounter: Secondary | ICD-10-CM | POA: Diagnosis not present

## 2021-03-10 LAB — COMPREHENSIVE METABOLIC PANEL
ALT: 14 U/L (ref 0–44)
AST: 16 U/L (ref 15–41)
Albumin: 3.5 g/dL (ref 3.5–5.0)
Alkaline Phosphatase: 48 U/L (ref 38–126)
Anion gap: 7 (ref 5–15)
BUN: 15 mg/dL (ref 6–20)
CO2: 25 mmol/L (ref 22–32)
Calcium: 9 mg/dL (ref 8.9–10.3)
Chloride: 104 mmol/L (ref 98–111)
Creatinine, Ser: 0.82 mg/dL (ref 0.44–1.00)
GFR, Estimated: >60 mL/min (ref >60)
Glucose, Bld: 132 mg/dL — ABNORMAL HIGH (ref 70–99)
Potassium: 4.1 mmol/L (ref 3.5–5.1)
Sodium: 136 mmol/L (ref 135–145)
Total Bilirubin: 0.4 mg/dL (ref 0.3–1.2)
Total Protein: 6.8 g/dL (ref 6.5–8.1)

## 2021-03-10 LAB — TROPONIN I (HIGH SENSITIVITY)
Troponin I (High Sensitivity): 3 ng/L (ref <18)
Troponin I (High Sensitivity): 4 ng/L (ref <18)

## 2021-03-10 NOTE — ED Provider Notes (Signed)
[facility] EMERGENCY DEPARTMENT Provider Note  CSN: [id number] Arrival date & time: 03/09/21 2250  Chief Complaint(s) Chest Pain and Numbness  HPI Lynn [name] is a 35 y.o. female with a past medical history listed below who presents to the emergency department with several days of intermittent left-sided upper back/chest/left arm and head discomfort.  Initially lasting 15 to 20 minutes that was self resolved or noted to be gone after taking a nap, now more prolonged and persistent.  Still intermittent.  Chest pain described as a "stitch."  Nonexertional and nonradiating.  No associated shortness of breath.  No nausea or vomiting.  No abdominal pain.    Chest Pain  Past Medical History Past Medical History:  Diagnosis Date   Carpal tunnel syndrome, left    Diabetes (HCC)    Hypertension    Iron deficiency    Panic attacks    PCOS (polycystic ovarian syndrome)    Tachycardia    Patient Active Problem List   Diagnosis Date Noted   Morbid obesity ([place]) 10/13/2020   Essential hypertension 03/12/2019   Chronic tachycardia 03/12/2019   Poorly controlled type 2 diabetes mellitus ([place]) 03/12/2019   Generalized anxiety disorder with panic attacks 03/12/2019   Depression, major, single episode, complete remission ([place]) 03/12/2019   Family history of heart attack 03/12/2019   LVH (left ventricular hypertrophy) due to hypertensive disease, without heart failure 03/12/2019   Home Medication(s) Prior to Admission medications   Medication Sig Start Date End Date Taking? Authorizing Provider  acetaminophen (TYLENOL) 500 MG tablet Take 1,000 mg by mouth every 6 (six) hours as needed for moderate pain or headache.   Yes [provider]  aspirin EC 325 MG tablet Take 325 mg by mouth daily.   Yes [provider]  carvedilol (COREG) 25 MG tablet Take 1 tablet (25 mg total) by mouth 2 (two) times daily with a meal. TAKE 1 TABLET(25 MG) BY MOUTH TWICE DAILY  WITH A MEAL Patient taking differently: Take 25 mg by mouth 2 (two) times daily with a meal. 10/13/20  Yes Lesleigh Noe, MD  FLUoxetine (PROZAC) 10 MG tablet Take 1 tablet (10 mg total) by mouth daily. Take 1/2 tablet per day. Patient not taking: Reported on 03/10/2021 10/13/20   Lesleigh Noe, MD  prochlorperazine (COMPAZINE) 10 MG tablet Take 1 tablet (10 mg total) by mouth every 6 (six) hours as needed for nausea (headache). Patient not taking: Reported on 10/13/2020 01/03/20   Paulette Blanch, MD  Semaglutide,0.25 or 0.5MG /DOS, (OZEMPIC, 0.25 OR 0.5 MG/DOSE,) 2 MG/1.5ML SOPN Inject 0.5 mg into the skin once a week. Patient not taking: Reported on 03/10/2021 11/28/20   Lesleigh Noe, MD                                                                                                                                    Past Surgical History Past  Surgical History:  Procedure Laterality Date   ADENOIDECTOMY     CESAREAN SECTION     TONSILLECTOMY     Family History Family History  Problem Relation Age of Onset   Heart disease Mother    Diabetes Mother    Hypertension Mother    Kidney disease Mother    Heart failure Mother    Heart attack Mother 50   Arthritis Mother    Asthma Mother    COPD Mother    Depression Mother    Hyperlipidemia Mother    Mental illness Mother    Heart disease Father    Heart attack Father 43   COPD Father    Hyperlipidemia Father    Hypertension Father    Mental illness Father    Arthritis Sister    Depression Sister    Diabetes Sister    Hypertension Sister    Mental illness Sister    Miscarriages / Stillbirths Sister    Heart failure Maternal Grandmother    Arthritis Maternal Grandmother    Alcohol abuse Maternal Grandmother    COPD Maternal Grandmother    Diabetes Maternal Grandmother    Hearing loss Maternal Grandmother    Heart disease Maternal Grandmother    Hyperlipidemia Maternal Grandmother    Hypertension Maternal Grandmother    Stroke  Maternal Grandmother    Miscarriages / Stillbirths Maternal Grandmother    Mental illness Maternal Grandmother    Heart attack Maternal Grandmother    Heart failure Maternal Grandfather    Alcohol abuse Maternal Grandfather    Arthritis Maternal Grandfather    Early death Maternal Grandfather    Heart disease Maternal Grandfather    Hyperlipidemia Maternal Grandfather    Hypertension Maternal Grandfather    Heart attack Maternal Grandfather 60   Mental illness Maternal Grandfather    Heart failure Paternal Grandmother    Arthritis Paternal Grandmother    Diabetes Paternal Grandmother    Heart attack Paternal 41 / Stillbirths Paternal Grandmother    Mental illness Paternal Grandmother    Kidney disease Paternal Grandmother    Intellectual disability Paternal Grandmother    Hypertension Paternal Grandmother    Hyperlipidemia Paternal Grandmother    Heart disease Paternal Grandmother    Heart failure Paternal Grandfather    Arthritis Paternal Grandfather    Heart attack Paternal Grandfather    Mental illness Paternal Grandfather    Hearing loss Paternal Grandfather    Heart disease Paternal Grandfather    Hyperlipidemia Paternal Grandfather    Hypertension Paternal Grandfather    Alcohol abuse Sister    Depression Sister    Diabetes Sister    Heart disease Sister    Heart attack Sister 70   Arthritis Sister    Kidney disease Sister    Transient ischemic attack Sister     Social History Social History   Tobacco Use   Smoking status: Former    Packs/day: 1.00    Years: 7.00    Pack years: 7.00    Types: Cigarettes, E-cigarettes    Quit date: 03/19/2014    Years since quitting: 6.9   Smokeless tobacco: Never  Vaping Use   Vaping Use: Never used  Substance Use Topics   Alcohol use: Yes    Comment: rarely   Drug use: No   Allergies Ibuprofen  Review of Systems Review of Systems  Cardiovascular:  Positive for chest pain.  All other  systems are reviewed and are negative for acute change  except as noted in the HPI  Physical Exam Vital Signs  I have reviewed the triage vital signs BP 135/83   Pulse 80   Temp 98.8 F (37.1 C) (Oral)   Resp 15   SpO2 100%   Physical Exam Vitals reviewed.  Constitutional:      General: She is not in acute distress.    Appearance: She is well-developed. She is not diaphoretic.  HENT:     Head: Normocephalic and atraumatic.     Nose: Nose normal.  Eyes:     General: No scleral icterus.       Right eye: No discharge.        Left eye: No discharge.     Conjunctiva/sclera: Conjunctivae normal.     Pupils: Pupils are equal, round, and reactive to light.  Cardiovascular:     Rate and Rhythm: Normal rate and regular rhythm.     Heart sounds: No murmur heard.   No friction rub. No gallop.  Pulmonary:     Effort: Pulmonary effort is normal. No respiratory distress.     Breath sounds: Normal breath sounds. No stridor. No rales.  Abdominal:     General: There is no distension.     Palpations: Abdomen is soft.     Tenderness: There is no abdominal tenderness.  Musculoskeletal:     Cervical back: Normal range of motion and neck supple. Spasms and tenderness present. No bony tenderness.       Back:  Skin:    General: Skin is warm and dry.     Findings: No erythema or rash.  Neurological:     Mental Status: She is alert and oriented to person, place, and time.    ED Results and Treatments Labs (all labs ordered are listed, but only abnormal results are displayed) Labs Reviewed  COMPREHENSIVE METABOLIC PANEL - Abnormal; Notable for the following components:      Result Value   Glucose, Bld 132 (*)    All other components within normal limits  CBC WITH DIFFERENTIAL/PLATELET - Abnormal; Notable for the following components:   Hemoglobin 11.5 (*)    HCT 35.4 (*)    All other components within normal limits  TROPONIN I (HIGH SENSITIVITY)  TROPONIN I (HIGH SENSITIVITY)                                                                                                                          EKG  EKG Interpretation  Date/Time:  Thursday March 09 2021 22:57:31 EST Ventricular Rate:  82 PR Interval:  160 QRS Duration: 96 QT Interval:  366 QTC Calculation: 427 R Axis:   38 Text Interpretation: Normal sinus rhythm Normal ECG No acute changes Confirmed by Addison Lank [phone]) on 03/10/2021 6:13:14 AM       Radiology DG Chest 2 View  Result Date: 03/10/2021 CLINICAL DATA:  Chest pain EXAM: CHEST - 2 VIEW COMPARISON:  02/24/2020 FINDINGS: The heart size and mediastinal contours are  within normal limits. Both lungs are clear. The visualized skeletal structures are unremarkable. IMPRESSION: No active cardiopulmonary disease. Electronically Signed   By: Inez Catalina M.D.   On: 03/10/2021 00:48    Pertinent labs & imaging results that were available during my care of the patient were reviewed by me and considered in my medical decision making (see MDM for details).  Medications Ordered in ED Medications - No data to display                                                                                                                                   Procedures Procedures  (including critical care time)  Medical Decision Making / ED Course I have reviewed the nursing notes for this encounter and the patient's prior records (if available in EHR or on provided paperwork).  Lynn [name] was evaluated in Emergency Department on 03/10/2021 for the symptoms described in the history of present illness. She was evaluated in the context of the global COVID-19 pandemic, which necessitated consideration that the patient might be at risk for infection with the SARS-CoV-2 virus that causes COVID-19. Institutional protocols and algorithms that pertain to the evaluation of patients at risk for COVID-19 are in a state of rapid change based on information released by regulatory  bodies including the CDC and federal and state organizations. These policies and algorithms were followed during the patient's care in the ED.     Highly atypical chest pain not consistent with ACS. EKG without acute ischemic changes or evidence of pericarditis.  Serial troponins negative x2.  Extremely low suspicion for ACS and no need for continued cardiac work-up at this time. Presentation not classic for either dissection or esophageal perforation. Low suspicion for pulmonary embolism.  Presentation likely related to muscle strain/spasm of the left shoulder girdle musculature. Rest of the work-up reassuring without leukocytosis or significant anemia. No electrolyte derangements or renal sufficiency.  On my read of the chest x-ray, there was no evidence suggestive of pneumonia, pneumothorax, pneumomediastinum, pulmonary edema concerning for new or exacerbation of heart failure, abnormal contour of the mediastinum to suggest dissection, and no evidence of acute injuries.   Pertinent labs & imaging results that were available during my care of the patient were reviewed by me and considered in my medical decision making:    Final Clinical Impression(s) / ED Diagnoses Final diagnoses:  Intermittent left-sided chest pain  Intermittent headache  Muscle strain   The patient appears reasonably screened and/or stabilized for discharge and I doubt any other medical condition or other [facility] requiring further screening, evaluation, or treatment in the ED at this time prior to discharge. Safe for discharge with strict return precautions.  Disposition: Discharge  Condition: Good  I have discussed the results, Dx and Tx plan with the patient/family who expressed understanding and agree(s) with the plan. Discharge instructions discussed at length. The patient/family was given strict return precautions who  verbalized understanding of the instructions. No further questions at time of discharge.     ED Discharge Orders     None       Follow Up: Lesleigh Noe, [place] [id number] [phone]  Call  as needed, if symptoms do not improve or  worsen     This chart was dictated using voice recognition software.  Despite best efforts to proofread,  errors can occur which can change the documentation meaning.    Fatima Blank, MD 03/10/21 [phone]

## 2021-03-10 NOTE — Discharge Instructions (Signed)
You may use over-the-counter Motrin (Ibuprofen), Acetaminophen (Tylenol), topical muscle creams such as SalonPas, Icy Hot, Bengay, etc. Please stretch, apply ice or heat (whichever helps), and have massage therapy for additional assistance.  

## 2021-04-21 ENCOUNTER — Other Ambulatory Visit: Payer: Self-pay

## 2021-04-21 ENCOUNTER — Other Ambulatory Visit: Payer: Self-pay | Admitting: Nurse Practitioner

## 2021-04-21 ENCOUNTER — Ambulatory Visit (INDEPENDENT_AMBULATORY_CARE_PROVIDER_SITE_OTHER)
Admission: RE | Admit: 2021-04-21 | Discharge: 2021-04-21 | Disposition: A | Discharge status: Home / Self Care | Payer: Managed Care, Other (non HMO) | Source: Ambulatory Visit | Attending: Nurse Practitioner | Admitting: Nurse Practitioner

## 2021-04-21 ENCOUNTER — Ambulatory Visit (INDEPENDENT_AMBULATORY_CARE_PROVIDER_SITE_OTHER): Payer: Managed Care, Other (non HMO) | Admitting: Nurse Practitioner

## 2021-04-21 VITALS — BP 124/62 | HR 85 | Temp 97.9°F | Resp 14 | Ht 67.0 in | Wt 289.2 lb

## 2021-04-21 DIAGNOSIS — M25512 Pain in left shoulder: Secondary | ICD-10-CM

## 2021-04-21 DIAGNOSIS — M25511 Pain in right shoulder: Secondary | ICD-10-CM | POA: Insufficient documentation

## 2021-04-21 DIAGNOSIS — M792 Neuralgia and neuritis, unspecified: Secondary | ICD-10-CM | POA: Diagnosis not present

## 2021-04-21 DIAGNOSIS — J029 Acute pharyngitis, unspecified: Secondary | ICD-10-CM | POA: Insufficient documentation

## 2021-04-21 LAB — CBC WITH DIFFERENTIAL/PLATELET
Basophils Absolute: 0.1 10*3/uL (ref 0.0–0.1)
Basophils Relative: 0.8 % (ref 0.0–3.0)
Eosinophils Absolute: 0.1 10*3/uL (ref 0.0–0.7)
Eosinophils Relative: 1.8 % (ref 0.0–5.0)
HCT: 37.1 % (ref 36.0–46.0)
Hemoglobin: 12.5 g/dL (ref 12.0–15.0)
Lymphocytes Relative: 29.6 % (ref 12.0–46.0)
Lymphs Abs: 1.8 10*3/uL (ref 0.7–4.0)
MCHC: 33.8 g/dL (ref 30.0–36.0)
MCV: 79 fl (ref 78.0–100.0)
Monocytes Absolute: 0.5 10*3/uL (ref 0.1–1.0)
Monocytes Relative: 8.4 % (ref 3.0–12.0)
Neutro Abs: 3.7 10*3/uL (ref 1.4–7.7)
Neutrophils Relative %: 59.4 % (ref 43.0–77.0)
Platelets: 254 10*3/uL (ref 150.0–400.0)
RBC: 4.7 Mil/uL (ref 3.87–5.11)
RDW: 13.8 % (ref 11.5–15.5)
WBC: 6.2 10*3/uL (ref 4.0–10.5)

## 2021-04-21 LAB — BASIC METABOLIC PANEL
BUN: 15 mg/dL (ref 6–23)
CO2: 30 mEq/L (ref 19–32)
Calcium: 8.8 mg/dL (ref 8.4–10.5)
Chloride: 99 mEq/L (ref 96–112)
Creatinine, Ser: 0.85 mg/dL (ref 0.40–1.20)
GFR: 89.82 mL/min (ref >60.00)
Glucose, Bld: 213 mg/dL — ABNORMAL HIGH (ref 70–99)
Potassium: 4.1 mEq/L (ref 3.5–5.1)
Sodium: 135 mEq/L (ref 135–145)

## 2021-04-21 MED ORDER — OMEPRAZOLE 20 MG PO CPDR: 20.0000 mg | DELAYED_RELEASE_CAPSULE | Freq: Every day | ORAL | 0 refills | Status: DC

## 2021-04-21 MED ORDER — METHOCARBAMOL 500 MG PO TABS: 500.0000 mg | ORAL_TABLET | Freq: Two times a day (BID) | ORAL | 0 refills | Status: AC | PRN

## 2021-04-21 NOTE — Assessment & Plan Note (Signed)
Has been evaluated in the past in the emergency department with this.  Work-up was benign and no medications given.  Pending x-ray in office today and tried methocarbamol 500 mg twice daily as needed.  Patient was thinking she had lump on her shoulder it feels like a bony prominence.  The other lump that she is experiencing posterior neck could be adiposity deposition.  Did not feel any lymphadenopathy supra or infraclavicularly.  Continue to monitor pending lab results

## 2021-04-21 NOTE — Patient Instructions (Signed)
Nice to see you today I will be in touch with the labs once I have the results along with the xray Schedule an appointment with Dr. Einar Pheasant as soon as possible Follow up if you do not improve

## 2021-04-21 NOTE — Assessment & Plan Note (Signed)
For almost a month.  Patient does have a history of heartburn and states that she has not had any lately we will place patient on PPI to see if this resolves the sore throat if no improvement in 2 weeks she is to reach back out to [place] for further work-up.

## 2021-04-21 NOTE — Assessment & Plan Note (Signed)
Some discomfort and pain the patient is experiencing in office seems to be nerve related.  No injury to neck or shoulder the patient is aware.  Pending x-ray and lab results

## 2021-04-21 NOTE — Progress Notes (Signed)
Acute Office Visit  Subjective:    Patient ID: Lynn [name], female    DOB: [birth year], 36 y.o.   MRN: [id number]  Chief Complaint  Patient presents with   Mass    Lump/knot on the upper left side of the chest-has been seen at ER on 03/09/22 for same symptoms. Pain present on left shoulder/arm, lump know on the top left shoulder pain.  Pain got worse. Sometimes gets tingling in her left hand.    HPI Patient is in today for Lump  States that she was evaluated for a lump on 03/09/2021. States was able to rub the muscle out. This resolved  Now has two lumps anterior hurts after you touch. States the postterior one is sore to the touch  States that she wakes and notices sometimes that she has odd pain that go down the left shoulder, arm, and side. States that the shoulder is achy in nature but when she goes to work she notes pains in underarm that is sharp stabbing and sometimes achy that can last 40mins or less  States that her and her family got sick after christmas and has had intermittent sore throat on the right side. Once a day. States that it is worse at night. States that she has a history of heartburn and has not felt it latey  Past Medical History:  Diagnosis Date   Carpal tunnel syndrome, left    Diabetes ([place])    Hypertension    Iron deficiency    Panic attacks    PCOS (polycystic ovarian syndrome)    Tachycardia     Past Surgical History:  Procedure Laterality Date   ADENOIDECTOMY     CESAREAN SECTION     TONSILLECTOMY      Family History  Problem Relation Age of Onset   Heart disease Mother    Diabetes Mother    Hypertension Mother    Kidney disease Mother    Heart failure Mother    Heart attack Mother 31   Arthritis Mother    Asthma Mother    COPD Mother    Depression Mother    Hyperlipidemia Mother    Mental illness Mother    Heart disease Father    Heart attack Father 63   COPD Father    Hyperlipidemia Father    Hypertension Father    Mental  illness Father    Arthritis Sister    Depression Sister    Diabetes Sister    Hypertension Sister    Mental illness Sister    Miscarriages / Stillbirths Sister    Heart failure Maternal Grandmother    Arthritis Maternal Grandmother    Alcohol abuse Maternal Grandmother    COPD Maternal Grandmother    Diabetes Maternal Grandmother    Hearing loss Maternal Grandmother    Heart disease Maternal Grandmother    Hyperlipidemia Maternal Grandmother    Hypertension Maternal Grandmother    Stroke Maternal Grandmother    Miscarriages / Stillbirths Maternal Grandmother    Mental illness Maternal Grandmother    Heart attack Maternal Grandmother    Heart failure Maternal Grandfather    Alcohol abuse Maternal Grandfather    Arthritis Maternal Grandfather    Early death Maternal Grandfather    Heart disease Maternal Grandfather    Hyperlipidemia Maternal Grandfather    Hypertension Maternal Grandfather    Heart attack Maternal Grandfather 60   Mental illness Maternal Grandfather    Heart failure Paternal Grandmother    Arthritis Paternal Grandmother  Diabetes Paternal Grandmother    Heart attack Paternal Grandmother    Miscarriages / Stillbirths Paternal Grandmother    Mental illness Paternal Grandmother    Kidney disease Paternal Grandmother    Intellectual disability Paternal Grandmother    Hypertension Paternal Grandmother    Hyperlipidemia Paternal Grandmother    Heart disease Paternal Grandmother    Heart failure Paternal Grandfather    Arthritis Paternal Grandfather    Heart attack Paternal Grandfather    Mental illness Paternal Grandfather    Hearing loss Paternal Grandfather    Heart disease Paternal Grandfather    Hyperlipidemia Paternal Grandfather    Hypertension Paternal Grandfather    Alcohol abuse Sister    Depression Sister    Diabetes Sister    Heart disease Sister    Heart attack Sister 77   Arthritis Sister    Kidney disease Sister    Transient ischemic  attack Sister     Social History   Socioeconomic History   Marital status: Single    Spouse name: Not on file   Number of children: 1   Years of education: some college   Highest education level: Not on file  Occupational History   Not on file  Tobacco Use   Smoking status: Former    Packs/day: 1.00    Years: 7.00    Pack years: 7.00    Types: Cigarettes, E-cigarettes    Quit date: 03/19/2014    Years since quitting: 7.0   Smokeless tobacco: Never  Vaping Use   Vaping Use: Never used  Substance and Sexual Activity   Alcohol use: Yes    Comment: rarely   Drug use: No   Sexual activity: Not Currently  Other Topics Concern   Not on file  Social History Narrative   03/12/19   From: [place], moved here [place]   Living: sister and her husband   Work: works for [employer]      Family: Daughter - Soil scientist (2015), gets along with sister       Enjoys: eating out (before covid), go to the movies      Exercise: walking and moving more at work   Diet: does not do great with diabetic, eating less overall      Safety   Seat belts: Yes    Guns: No   Safe in relationships: Yes    Social Determinants of Radio broadcast assistant Strain: Not on file  Food Insecurity: Not on file  Transportation Needs: Not on file  Physical Activity: Not on file  Stress: Not on file  Social Connections: Not on file  Intimate Partner Violence: Not on file    Outpatient Medications Prior to Visit  Medication Sig Dispense Refill   acetaminophen (TYLENOL) 500 MG tablet Take 1,000 mg by mouth every 6 (six) hours as needed for moderate pain or headache.     aspirin EC 325 MG tablet Take 325 mg by mouth daily.     carvedilol (COREG) 25 MG tablet Take 1 tablet (25 mg total) by mouth 2 (two) times daily with a meal. TAKE 1 TABLET(25 MG) BY MOUTH TWICE DAILY WITH A MEAL (Patient taking differently: Take 25 mg by mouth 2 (two) times daily with a meal.) 180 tablet 3   FLUoxetine (PROZAC) 10 MG tablet  Take 1 tablet (10 mg total) by mouth daily. Take 1/2 tablet per day. (Patient not taking: Reported on 03/10/2021) 90 tablet 3   Semaglutide,0.25 or 0.5MG /DOS, (OZEMPIC, 0.25 OR 0.5 MG/DOSE,)  2 MG/1.5ML SOPN Inject 0.5 mg into the skin once a week. (Patient not taking: Reported on 04/21/2021) 3 mL 0   prochlorperazine (COMPAZINE) 10 MG tablet Take 1 tablet (10 mg total) by mouth every 6 (six) hours as needed for nausea (headache). (Patient not taking: Reported on 10/13/2020) 20 tablet 0   No facility-administered medications prior to visit.    Allergies  Allergen Reactions   Ibuprofen Hives    Review of Systems  Constitutional:  Negative for chills and fever.  Respiratory:  Negative for cough and shortness of breath.   Cardiovascular:  Negative for chest pain.  Gastrointestinal:  Negative for diarrhea, nausea and vomiting.  Neurological:  Positive for numbness (left side hand).      Objective:    Physical Exam Vitals and nursing note reviewed.  Constitutional:      Appearance: She is obese.  HENT:     Right Ear: Tympanic membrane, ear canal and external ear normal.     Left Ear: Tympanic membrane, ear canal and external ear normal.     Mouth/Throat:     Mouth: Mucous membranes are moist.     Pharynx: Oropharynx is clear. No posterior oropharyngeal erythema.  Cardiovascular:     Rate and Rhythm: Normal rate and regular rhythm.     Pulses: Normal pulses.     Heart sounds: Normal heart sounds.  Pulmonary:     Effort: Pulmonary effort is normal.     Breath sounds: Normal breath sounds.  Abdominal:     General: Bowel sounds are normal.  Musculoskeletal:        General: Tenderness present.     Right shoulder: Normal.     Left shoulder: Tenderness and bony tenderness present.       Arms:     Cervical back: No tenderness.  Lymphadenopathy:     Cervical: No cervical adenopathy.  Neurological:     General: No focal deficit present.     Mental Status: She is alert.     Deep  Tendon Reflexes:     Reflex Scores:      Bicep reflexes are 1+ on the right side and 1+ on the left side.    Comments: Bilateral upper extremity strength 5/5    BP 124/62    Pulse 85    Temp 97.9 F (36.6 C)    Resp 14    Ht 5\' 7"  (1.702 m)    Wt 289 lb 4 oz (131.2 kg)    SpO2 95%    BMI 45.30 kg/m  Wt Readings from Last 3 Encounters:  04/21/21 289 lb 4 oz (131.2 kg)  10/13/20 (!) 307 lb 8 oz (139.5 kg)  02/24/20 (!) 335 lb (152 kg)    Health Maintenance Due  Topic Date Due   COVID-19 Vaccine (1) Never done   OPHTHALMOLOGY EXAM  Never done   PAP SMEAR-Modifier  Never done   INFLUENZA VACCINE  Never done   HEMOGLOBIN A1C  04/15/2021    There are no preventive care reminders to display for this patient.   No results found for: TSH Lab Results  Component Value Date   WBC 8.3 03/09/2021   HGB 11.5 (L) 03/09/2021   HCT 35.4 (L) 03/09/2021   MCV 82.7 03/09/2021   PLT 283 03/09/2021   Lab Results  Component Value Date   NA 136 03/09/2021   K 4.1 03/09/2021   CO2 25 03/09/2021   GLUCOSE 132 (H) 03/09/2021   BUN 15 03/09/2021  CREATININE 0.82 03/09/2021   BILITOT 0.4 03/09/2021   ALKPHOS 48 03/09/2021   AST 16 03/09/2021   ALT 14 03/09/2021   PROT 6.8 03/09/2021   ALBUMIN 3.5 03/09/2021   CALCIUM 9.0 03/09/2021   ANIONGAP 7 03/09/2021   GFR 106.45 10/13/2020   Lab Results  Component Value Date   CHOL 152 10/13/2020   Lab Results  Component Value Date   HDL 38.60 (L) 10/13/2020   Lab Results  Component Value Date   LDLCALC 76 10/13/2020   Lab Results  Component Value Date   TRIG 188.0 (H) 10/13/2020   Lab Results  Component Value Date   CHOLHDL 4 10/13/2020   Lab Results  Component Value Date   HGBA1C 7.0 (A) 10/13/2020       Assessment & Plan:   Problem List Items Addressed This Visit       Other   Acute pain of left shoulder - Primary    Has been evaluated in the past in the emergency department with this.  Work-up was benign and no  medications given.  Pending x-ray in office today and tried methocarbamol 500 mg twice daily as needed.  Patient was thinking she had lump on her shoulder it feels like a bony prominence.  The other lump that she is experiencing posterior neck could be adiposity deposition.  Did not feel any lymphadenopathy supra or infraclavicularly.  Continue to monitor pending lab results      Relevant Medications   methocarbamol (ROBAXIN) 500 MG tablet   Other Relevant Orders   DG Shoulder Left (Completed)   Nerve pain    Some discomfort and pain the patient is experiencing in office seems to be nerve related.  No injury to neck or shoulder the patient is aware.  Pending x-ray and lab results      Relevant Orders   CBC with Differential/Platelet   Basic metabolic panel   Sore throat    For almost a month.  Patient does have a history of heartburn and states that she has not had any lately we will place patient on PPI to see if this resolves the sore throat if no improvement in 2 weeks she is to reach back out to [place] for further work-up.      Relevant Medications   omeprazole (PRILOSEC) 20 MG capsule     Meds ordered this encounter  Medications   omeprazole (PRILOSEC) 20 MG capsule    Sig: Take 1 capsule (20 mg total) by mouth daily.    Dispense:  30 capsule    Refill:  0    Order Specific Question:   Supervising Provider    Answer:   Glori Bickers MARNE A [[id number]]   methocarbamol (ROBAXIN) 500 MG tablet    Sig: Take 1 tablet (500 mg total) by mouth 2 (two) times daily as needed for muscle spasms.    Dispense:  20 tablet    Refill:  0    Order Specific Question:   Supervising Provider    Answer:   Loura Pardon A [[id number]]   This visit occurred during the SARS-CoV-2 public health emergency.  Safety protocols were in place, including screening questions prior to the visit, additional usage of staff PPE, and extensive cleaning of exam room while observing appropriate contact time as indicated for  disinfecting solutions.    Romilda Garret, NP

## 2021-05-12 ENCOUNTER — Other Ambulatory Visit: Payer: Self-pay

## 2021-05-12 ENCOUNTER — Ambulatory Visit (INDEPENDENT_AMBULATORY_CARE_PROVIDER_SITE_OTHER): Payer: Managed Care, Other (non HMO) | Admitting: Family Medicine

## 2021-05-12 ENCOUNTER — Encounter: Payer: Self-pay | Admitting: Family Medicine

## 2021-05-12 VITALS — BP 120/60 | HR 96 | Temp 98.1°F | Wt 289.2 lb

## 2021-05-12 DIAGNOSIS — I10 Essential (primary) hypertension: Secondary | ICD-10-CM

## 2021-05-12 DIAGNOSIS — J029 Acute pharyngitis, unspecified: Secondary | ICD-10-CM | POA: Diagnosis not present

## 2021-05-12 DIAGNOSIS — M25512 Pain in left shoulder: Secondary | ICD-10-CM

## 2021-05-12 DIAGNOSIS — E1169 Type 2 diabetes mellitus with other specified complication: Secondary | ICD-10-CM | POA: Diagnosis not present

## 2021-05-12 DIAGNOSIS — J069 Acute upper respiratory infection, unspecified: Secondary | ICD-10-CM

## 2021-05-12 DIAGNOSIS — E1165 Type 2 diabetes mellitus with hyperglycemia: Secondary | ICD-10-CM

## 2021-05-12 LAB — POCT GLYCOSYLATED HEMOGLOBIN (HGB A1C): Hemoglobin A1C: 5.9 % — AB (ref 4.0–5.6)

## 2021-05-12 LAB — POCT RAPID STREP A (OFFICE): Rapid Strep A Screen: NEGATIVE

## 2021-05-12 MED ORDER — OZEMPIC (0.25 OR 0.5 MG/DOSE) 2 MG/1.5ML ~~LOC~~ SOPN: 0.5000 mg | PEN_INJECTOR | SUBCUTANEOUS | 0 refills | Status: DC

## 2021-05-12 NOTE — Patient Instructions (Addendum)
Try to get ozempic and restart  Come back in 3 months for weight check and annual  If you start ozempic, mychart in 1 month for dose increase

## 2021-05-12 NOTE — Assessment & Plan Note (Signed)
Diet controlled due to ozempic shortage. Refilled ozempic to restart if able. If not continued diet/exercise. Return 3 months for recheck

## 2021-05-12 NOTE — Assessment & Plan Note (Addendum)
Area of concern on the anterior shoulder near the neck base feels like muscle tension/knot. Advise PT to help with ongoing shoulder pain. Referral placed. If mass not resolving or additional concern will plan for [place]

## 2021-05-12 NOTE — Assessment & Plan Note (Signed)
BP controlled. Continue Carvedilol 25 mg

## 2021-05-12 NOTE — Progress Notes (Signed)
Subjective:     Lynn [name] is a 36 y.o. female presenting for Mass (Left collar bone area. Saw Matt for this in January. ), Fever (102 oral on Tuesday ), Generalized Body Aches, and Nasal Congestion     Fever  Associated symptoms include congestion and a sore throat.   #Fever/sore throat - taking tylenol at home - sore throat is severe - 2 negative covid tests - body aches - no cough - just the nasal congestion - everyone at work is sick - flu exposure and strept  #Mass - left collar bone - was getting pain/tingling in left arm and to the side of the head - pain radiating to the chest occasionally - ER - diagnosed with muscle spasms - and told that she was pinching a nerve - muscle relaxers  - saw [employer] - with lump - thought adipose vs bony prominence - shoulder XR - was normal - feel down the stairs months ago - continues to have intermittent pain - with the tingling and pain  - not a heart     #Diabetes Currently taking - nothing - due to shortage of medications  Using medications without difficulties: No Hypoglycemic episodes:No  Hyperglycemic episodes:No  Feet problems:No  Blood Sugars averaging: does not check Last HgbA1c:  Lab Results  Component Value Date   HGBA1C 5.9 (A) 05/12/2021   Did gain some weight over christmas  Diabetes Health Maintenance Due:    Diabetes Health Maintenance Due  Topic Date Due   OPHTHALMOLOGY EXAM  Never done   FOOT EXAM  10/13/2021   URINE MICROALBUMIN  10/13/2021   HEMOGLOBIN A1C  11/09/2021       Review of Systems  Constitutional:  Positive for fever.  HENT:  Positive for congestion and sore throat.     Social History   Tobacco Use  Smoking Status Former   Packs/day: 1.00   Years: 7.00   Pack years: 7.00   Types: Cigarettes, E-cigarettes   Quit date: 03/19/2014   Years since quitting: 7.1  Smokeless Tobacco Never        Objective:    BP Readings from Last 3 Encounters:  05/12/21 120/60   04/21/21 124/62  03/10/21 135/83   Wt Readings from Last 3 Encounters:  05/12/21 289 lb 4 oz (131.2 kg)  04/21/21 289 lb 4 oz (131.2 kg)  10/13/20 (!) 307 lb 8 oz (139.5 kg)    BP 120/60    Pulse 96    Temp 98.1 F (36.7 C) (Oral)    Wt 289 lb 4 oz (131.2 kg)    SpO2 99%    BMI 45.30 kg/m    Physical Exam Constitutional:      General: She is not in acute distress.    Appearance: She is well-developed. She is not diaphoretic.  HENT:     Head: Normocephalic and atraumatic.     Right Ear: Tympanic membrane and ear canal normal.     Left Ear: Tympanic membrane and ear canal normal.     Nose: Mucosal edema and rhinorrhea present.     Right Sinus: No maxillary sinus tenderness or frontal sinus tenderness.     Left Sinus: No maxillary sinus tenderness or frontal sinus tenderness.     Mouth/Throat:     Pharynx: Uvula midline. Posterior oropharyngeal erythema present. No oropharyngeal exudate.     Tonsils: 0 on the right. 0 on the left.  Eyes:     General: No scleral icterus.  Conjunctiva/sclera: Conjunctivae normal.  Cardiovascular:     Rate and Rhythm: Normal rate and regular rhythm.     Heart sounds: Normal heart sounds. No murmur heard. Pulmonary:     Effort: Pulmonary effort is normal. No respiratory distress.     Breath sounds: Normal breath sounds.  Musculoskeletal:     Cervical back: Neck supple.  Lymphadenopathy:     Cervical: No cervical adenopathy.  Skin:    General: Skin is warm and dry.     Capillary Refill: Capillary refill takes less than 2 seconds.  Neurological:     Mental Status: She is alert.          Assessment & Plan:   Problem List Items Addressed This Visit       Cardiovascular and Mediastinum   Essential hypertension    BP controlled. Continue Carvedilol 25 mg        Endocrine   Type 2 diabetes mellitus with other specified complication ([place]) - Primary    Diet controlled due to ozempic shortage. Refilled ozempic to restart if able.  If not continued diet/exercise. Return 3 months for recheck      Relevant Medications   Semaglutide,0.25 or 0.5MG /DOS, (OZEMPIC, 0.25 OR 0.5 MG/DOSE,) 2 MG/1.5ML SOPN     Other   Acute pain of left shoulder    Area of concern on the anterior shoulder near the neck base feels like muscle tension/knot. Advise PT to help with ongoing shoulder pain. Referral placed. If mass not resolving or additional concern will plan for [place]      Relevant Orders   Ambulatory referral to Physical Therapy   Sore throat   Relevant Orders   POCT rapid strep A (Completed)   Other Visit Diagnoses     Viral URI          Rapid strept negative Too late for flu treatment  Suspect virus which is improving, continue symptomatic care  Return in about 3 months (around 08/09/2021) for weight check and annual.  Lesleigh Noe, MD  This visit occurred during the SARS-CoV-2 public health emergency.  Safety protocols were in place, including screening questions prior to the visit, additional usage of staff PPE, and extensive cleaning of exam room while observing appropriate contact time as indicated for disinfecting solutions.

## 2021-05-23 ENCOUNTER — Telehealth: Payer: Self-pay

## 2021-05-23 NOTE — Telephone Encounter (Signed)
Received PA for Ozempic. Submitted through cover my meds. Waiting for decision.

## 2021-05-25 ENCOUNTER — Encounter: Payer: Self-pay | Admitting: Family Medicine

## 2021-05-27 NOTE — Therapy (Deleted)
?OUTPATIENT PHYSICAL THERAPY SHOULDER EVALUATION ? ? ?Patient Name: Lynn [name] ?MRN: [id number] ?DOB:09/04/1985, 36 y.o., female ?Today's Date: 05/27/2021 ? ? ? ?Past Medical History:  ?Diagnosis Date  ? Carpal tunnel syndrome, left   ? Diabetes ([place])   ? Hypertension   ? Iron deficiency   ? Panic attacks   ? PCOS (polycystic ovarian syndrome)   ? Tachycardia   ? ?Past Surgical History:  ?Procedure Laterality Date  ? ADENOIDECTOMY    ? CESAREAN SECTION    ? TONSILLECTOMY    ? ?Patient Active Problem List  ? Diagnosis Date Noted  ? Acute pain of left shoulder 04/21/2021  ? Nerve pain 04/21/2021  ? Sore throat 04/21/2021  ? Morbid obesity ([place]) 10/13/2020  ? Essential hypertension 03/12/2019  ? Chronic tachycardia 03/12/2019  ? Type 2 diabetes mellitus with other specified complication ([place]) 27/74/1287  ? Generalized anxiety disorder with panic attacks 03/12/2019  ? Depression, major, single episode, complete remission ([place]) 03/12/2019  ? Family history of heart attack 03/12/2019  ? LVH (left ventricular hypertrophy) due to hypertensive disease, without heart failure 03/12/2019  ? ? ?PCP: Lesleigh Noe, MD ? ?REFERRING PROVIDER: Lesleigh Noe, MD ? ?REFERRING DIAG: *** ? ?THERAPY DIAG:  ?No diagnosis found. ? ? ?ONSET DATE: *** ? ?SUBJECTIVE:                                                                                                                                                                                          ? ?MOI/History of condition:  ? ?Lynn [name] is a 36 y.o. female who presents to clinic with chief complaint of ***.  *** ? ?From referring provider:  ? ?"***" ? ? Red flags:  ?{has/denies:26543} {kerredflag:26542} ? ?Pertinent past history:  ?*** ? ?Pain:  ?Are you having pain? {yes/no:20286} ?Pain location: *** ?NPRS scale:  ?highest {NUMBERS; 0-10:5044}/10 ?current {NUMBERS; 0-10:5044}/10  ?best {NUMBERS; 0-10:5044}/10 ?Aggravating factors: *** ?Relieving factors: *** ?Pain description:  {PAIN DESCRIPTION:21022940} ?Severity: {Desc; low/moderate/high:110033} ?Irritability: {Desc; low/moderate/high:110033} ?Stage: {Desc; acute/subacute/chronic:13799} ?Stability: {kerbetterworse:26715} ?24 hour pattern: ***  ? ?Occupation: *** ? ?Hobbies/Recreation: *** ? ?Assistive Device: *** ? ?Hand Dominance: *** ? ?Patient Goals: *** ? ? ?PRECAUTIONS: {Therapy precautions:24002} ? ?WEIGHT BEARING RESTRICTIONS {Yes ***/No:24003} ? ?FALLS:  ?Has patient fallen in last 6 months? {yes/no:20286}, Number of falls: *** ? ?LIVING ENVIRONMENT: ?Lives with: {[facility] lives with:25569::"lives with their family"} ?Stairs: {yes/no:20286}; {Stairs:24000} ? ?PLOF: {PLOF:24004} ? ?DIAGNOSTIC FINDINGS:  ?***  ? ? ?OBJECTIVE:  ? ?GENERAL OBSERVATION: ? *** ?    ?SENSATION: ? Light touch: {intact/deficits:24005} ?  ?PALPATION: ?*** ? ?UPPER EXTREMITY AROM: ? ?ROM Right ?05/27/2021 Left ?  05/27/2021  ?Shoulder flexion    ?Shoulder abduction    ?Shoulder internal rotation    ?Shoulder external rotation    ?Functional IR    ?Functional ER    ?Shoulder extension    ?Elbow extension    ?Elbow flexion    ? ?(Blank rows = not tested, N = WNL, * = concordant pain with testing) ? ?UPPER EXTREMITY MMT: ? ?MMT Right ?05/27/2021 Left ?05/27/2021  ?Shoulder flexion    ?Shoulder abduction (C5)    ?Shoulder ER    ?Shoulder IR    ?Middle trapezius    ?Lower trapezius    ?Shoulder extension    ?Grip strength    ?Cervical flexion (C1,C2)    ?Cervical S/B (C3)    ?Shoulder shrug (C4)    ?Elbow flexion (C6)    ?Elbow ext (C7)    ?Thumb ext (C8)    ?Finger abd (T1)    ?Grossly    ? ?(Blank rows = not tested, score listed is out of 5 possible points.  N = WNL, D = diminished, C = clear for gross weakness with myotome testing, * = concordant pain with testing) ? ? MUSCLE LENGTH: ?  ? Pec Major: L (***), R (***) for restriction ? ?UPPER EXTREMITY PROM: ? ?PROM Right ?05/27/2021 Left ?05/27/2021  ?Shoulder flexion    ?Shoulder abduction    ?Shoulder internal rotation     ?Shoulder external rotation    ?Functional IR    ?Functional ER    ?Shoulder extension    ?Elbow extension    ?Elbow flexion    ? ?(Blank rows = not tested, N = WNL, * = concordant pain with testing) ? ?SHOULDER SPECIAL TESTS: ? *** ? ?JOINT MOBILITY TESTING:  ?*** ? ?PATIENT SURVEYS:  ?{rehab surveys:24030:a} ?  ? ?TODAY'S TREATMENT:  ?*** ? ? ?PATIENT EDUCATION:  ?POC, diagnosis, prognosis, HEP, and outcome measures.  Pt educated via explanation, demonstration, and handout (HEP).  Pt confirms understanding verbally.  ? ? ?HOME EXERCISE PROGRAM: ?*** ? ?ASSESSMENT: ? ?CLINICAL IMPRESSION: ?Mellony is a 36 y.o. female who presents to clinic with signs and sxs consistent with ***.   ? ?OBJECTIVE IMPAIRMENTS: Pain, *** ? ?ACTIVITY LIMITATIONS: *** ? ?PERSONAL FACTORS: See medical history and pertinent history ? ? ?REHAB POTENTIAL: {rehabpotential:25112} ? ?CLINICAL DECISION MAKING: {clinical decision making:25114} ? ?EVALUATION COMPLEXITY: {Evaluation complexity:25115} ? ? ?GOALS: ? ?SHORT TERM GOALS: ? ?STG Name Target Date Goal status  ?[place] will be >75% HEP compliant to improve carryover between sessions and facilitate independent management of condition ? ?Baseline: No HEP 06/17/2021 INITIAL  ? ?LONG TERM GOALS:  ? ?LTG Name Target Date Goal status  ?1 *** 07/22/2021 INITIAL  ?2 *** 07/22/2021 INITIAL  ?3 *** 07/22/2021 INITIAL  ?4 *** 07/22/2021 INITIAL  ?5 *** 07/22/2021 INITIAL  ?6 *** 07/22/2021 INITIAL  ?7 *** 07/22/2021 INITIAL  ? ?PLAN: ?PT FREQUENCY: 1-2x/week ? ?PT DURATION: 8 weeks (Ending 07/22/2021) ? ?PLANNED INTERVENTIONS: Therapeutic exercises, Aquatic therapy, Therapeutic activity, Neuro Muscular re-education, Gait training, Patient/Family education, Joint mobilization, Dry Needling, Electrical stimulation, Spinal mobilization and/or manipulation, Moist heat, Taping, Vasopneumatic device, Ionotophoresis 4mg /ml Dexamethasone, and Manual therapy ? ?PLAN FOR NEXT SESSION: *** ? ? ?Shearon Balo PT,  DPT ?05/27/2021, 8:39 AM ? ?

## 2021-05-31 ENCOUNTER — Ambulatory Visit: Payer: Managed Care, Other (non HMO) | Admitting: Family Medicine

## 2021-05-31 NOTE — Therapy (Signed)
OUTPATIENT PHYSICAL THERAPY SHOULDER EVALUATION   Patient Name: Lynn [name] MRN: [id number] DOB:01-27-1986, 36 y.o., female Today's Date: 06/01/2021   PT End of Session - 06/01/21 0945     Visit Number 1    Number of Visits 13    Date for PT Re-Evaluation 07/13/21    Authorization Type [employer]    Progress Note Due on Visit 10    PT Start Time 0917    PT Stop Time 1000    PT Time Calculation (min) 43 min    Activity Tolerance Patient tolerated treatment well    Behavior During Therapy [facility] for tasks assessed/performed             Past Medical History:  Diagnosis Date   Carpal tunnel syndrome, left    Diabetes ([place])    Hypertension    Iron deficiency    Panic attacks    PCOS (polycystic ovarian syndrome)    Tachycardia    Past Surgical History:  Procedure Laterality Date   ADENOIDECTOMY     CESAREAN SECTION     TONSILLECTOMY     Patient Active Problem List   Diagnosis Date Noted   Acute pain of left shoulder 04/21/2021   Nerve pain 04/21/2021   Sore throat 04/21/2021   Morbid obesity ([place]) 10/13/2020   Essential hypertension 03/12/2019   Chronic tachycardia 03/12/2019   Type 2 diabetes mellitus with other specified complication ([place]) 40/97/3532   Generalized anxiety disorder with panic attacks 03/12/2019   Depression, major, single episode, complete remission ([place]) 03/12/2019   Family history of heart attack 03/12/2019   LVH (left ventricular hypertrophy) due to hypertensive disease, without heart failure 03/12/2019    PCP: Lesleigh Noe, MD  REFERRING PROVIDER: Lesleigh Noe, MD  REFERRING DIAG: Acute pain of left shoulder  THERAPY DIAG:  Abnormal posture  Muscle weakness (generalized)  Acute pain of left shoulder   ONSET DATE: October 2022  SUBJECTIVE:                                                                                                                                                                                       SUBJECTIVE STATEMENT: Patient reports she has a lump in front of her left shoulder that hurts for a few hours after touching it. States she also feel a knot in the back of her left shoulder too that feels sore and painful. Doctor told her it could be muscular knots. States she has been on muscle relaxer meds that help some. Denies any MOI, but states this has been a gradual pain that has worsened since October of 2022. Patient states she did go to  the ER to make sure her pain was not due to her heart, and she was told that was fine. Patient reports she occasionally gets tingling that goes down her left arm into all 5 of her fingers at random times.   PERTINENT HISTORY: Area of concern on the anterior shoulder near the neck base feels like muscle tension/knot. Mass present on left collar bone area. Per patient report, she continues to have intermittent pain and tingling. States her doctors told her she could be pinching a nerve.  PAIN:  Are you having pain? 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  PLOF: Independent  PATIENT GOALS "Make pain better."   OBJECTIVE:   DIAGNOSTIC FINDINGS:  Xray- negative  PATIENT SURVEYS:  FOTO: 53, predicted 70  COGNITION:  Overall cognitive status: Within functional limits for tasks assessed     SENSATION:  Light touch: Appears  intact   POSTURE: Rounded shoulders and forward head posture   UPPER EXTREMITY AROM/PROM:  A/PROM Right 06/01/2021 Left 06/01/2021  Shoulder flexion 180 170**  Shoulder extension    Shoulder abduction 180 162**  Shoulder adduction    Shoulder internal rotation 90 70**  Shoulder external rotation 75 75**  Elbow flexion    Elbow extension    Wrist flexion    Wrist extension    Wrist ulnar deviation    Wrist radial deviation    Wrist pronation    Wrist supination    (Blank rows = not tested)  UPPER EXTREMITY MMT:  MMT Right 06/01/2021 Left 06/01/2021  Shoulder flexion 4+ 4-*  Shoulder extension    Shoulder abduction 4+ 3+*  Shoulder adduction    Shoulder internal rotation 4+ 4  Shoulder external rotation 4+ 4  Middle trapezius  3+*  Lower trapezius  3*  Elbow flexion    Elbow extension    Wrist flexion    Wrist extension    Wrist ulnar deviation    Wrist radial deviation    Wrist pronation    Wrist supination    Grip strength (lbs) 75 lbs 30 lbs  (Blank rows = not tested)  SHOULDER SPECIAL TESTS:  Impingement tests: Neer impingement test: positive , Hawkins/Kennedy impingement test: positive , and Painful arc test: negative  SLAP lesions:  O'Brien: negative  Rotator cuff assessment: Drop arm test: negative, Empty can test: negative, Internal rotation lag sign: positive , and Infraspinatus test: negative  Biceps assessment: Speed's test: positive  TOS:  Adson's test + neuro symptoms Roos: + neuro symptoms  JOINT MOBILITY TESTING:  A to P joint mobs: hypomobile with min pain  PALPATION:  Tender to palpate left scalenes, SCM, upper trap, and rhomboids     PATIENT EDUCATION: Education details: Discussed findings in evaluation, importance of good posture, and HEP. Also, provided a handout for information regarding advanced healthcare directives.  Person educated: Patient Education method: Explanation, Demonstration, Tactile cues, Verbal cues, and  Handouts Education  comprehension: verbalized understanding and returned demonstration   HOME EXERCISE PROGRAM: Access Code: [email or web address] URL: [email or web address] Date: 06/01/2021 Prepared by: [name]  Exercises Seated Scapular Retraction - 1 x daily - 7 x weekly - 31 sets - 10 reps - 8 sec hold Single Arm Doorway Pec Stretch at 90 Degrees Abduction - 1 x daily - 7 x weekly - 2-3 reps - 30sec hold   ASSESSMENT:  CLINICAL IMPRESSION: Patient is a 36 y.o. female who was seen today for physical therapy evaluation and treatment for left shoulder pain. She demonstrates abnormal posture along with a positive Hawkin's Kennedy and positive Neer's test indicative of shoulder impingement. She demonstrates weakness in her left shoulder musculature as well as reduced AROM. Abnormal posture could be causing neuro symptoms going down her left arm. 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 Patient will benefit from skilled PT services to address impairments and improve her ability to perform ADLs and work duties without left shoulder pain.    OBJECTIVE IMPAIRMENTS decreased activity tolerance, decreased ROM, decreased strength, and pain.   ACTIVITY LIMITATIONS  lifting items at work, sleeping, cleaning .   PERSONAL FACTORS Time since onset of injury/illness/exacerbation and 3+ comorbidities: carpal tunnel sx, diabetes, HTN  are also affecting patient's functional outcome.    REHAB POTENTIAL: Good  CLINICAL DECISION MAKING: Evolving/moderate complexity  EVALUATION COMPLEXITY: Moderate   GOALS: Goals reviewed with patient? No  SHORT TERM GOALS:  STG Name Target Date Goal status  1 Patient will be independent with HEP for PT progression. Baseline: initial HEP provided 06/22/2021 INITIAL  2 Patient will demonstrate >/= 4/5 MMT for left shoulder musculature.  Baseline:  06/22/2021 INITIAL  3 Patient  will report 2/10 left shoulder pain when lifting <5 pounds overhead.  Baseline: 5/10 left shoulder pain when lifting left arm into [facility] 06/22/2021 INITIAL  4 Patient will demonstrate improved awareness of correct body posture when performing activities at work.  Baseline: demonstrates rounded shoulders and forward head posture  06/22/2021 INITIAL   LONG TERM GOALS:   LTG Name Target Date Goal status  1 Patient will score a 70 on the FOTO to demonstrate improved tolerance to perform functional activities.  Baseline: 53 07/13/2021 INITIAL  2 Patient will be able to lift 75 pounds overhead without left shoulder pain to mimic work related duties.  Baseline: painful to lift left arm into flexion 07/13/2021 INITIAL  3 Patient will demonstrate 180 degrees of pain free left shoulder flexion and abduction.  Baseline: 170 degrees flexion with pain and 162 degrees abduction with pain 07/13/2021 INITIAL   PLAN: PT FREQUENCY: 2x/week  PT DURATION: 6 weeks  PLANNED INTERVENTIONS: Therapeutic exercises, Therapeutic activity, Neuromuscular re-education, Balance training, Gait training, Patient/Family education, Joint mobilization, Dry Needling, Cryotherapy, Moist heat, and Manual therapy  PLAN FOR NEXT SESSION: Manual therapy to left upper trap, lower trap strengthening, cervical stretching, postural correction exercises. Consider dry needling.  Check all possible CPT codes: 97110- Therapeutic Exercise, [phone]- Neuro Re-education, 97140 - Manual Therapy, 97530 - Therapeutic Activities, 97535 - Self Care, and 97014 - Electrical stimulation (unattended)     If treatment provided at initial evaluation, no treatment charged due to lack of authorization.       Renato Gails Shareef Eddinger, PT, DPT 06/01/2021, 10:38 AM

## 2021-06-01 ENCOUNTER — Encounter: Payer: Self-pay | Admitting: Physical Therapy

## 2021-06-01 ENCOUNTER — Ambulatory Visit: Payer: Managed Care, Other (non HMO) | Attending: Family Medicine | Admitting: Physical Therapy

## 2021-06-01 ENCOUNTER — Other Ambulatory Visit: Payer: Self-pay

## 2021-06-01 DIAGNOSIS — R293 Abnormal posture: Secondary | ICD-10-CM | POA: Insufficient documentation

## 2021-06-01 DIAGNOSIS — M25512 Pain in left shoulder: Secondary | ICD-10-CM | POA: Insufficient documentation

## 2021-06-01 DIAGNOSIS — M6281 Muscle weakness (generalized): Secondary | ICD-10-CM | POA: Diagnosis present

## 2021-06-04 ENCOUNTER — Encounter (HOSPITAL_COMMUNITY): Payer: Self-pay | Admitting: Emergency Medicine

## 2021-06-04 ENCOUNTER — Other Ambulatory Visit: Payer: Self-pay

## 2021-06-04 ENCOUNTER — Emergency Department (HOSPITAL_COMMUNITY): Payer: Managed Care, Other (non HMO)

## 2021-06-04 ENCOUNTER — Emergency Department (HOSPITAL_COMMUNITY)
Admission: EM | Admit: 2021-06-04 | Discharge: 2021-06-04 | Disposition: A | Discharge status: Home / Self Care | Payer: Managed Care, Other (non HMO) | Attending: Emergency Medicine | Admitting: Emergency Medicine

## 2021-06-04 DIAGNOSIS — R112 Nausea with vomiting, unspecified: Secondary | ICD-10-CM | POA: Diagnosis not present

## 2021-06-04 DIAGNOSIS — R42 Dizziness and giddiness: Secondary | ICD-10-CM | POA: Diagnosis not present

## 2021-06-04 DIAGNOSIS — Z7982 Long term (current) use of aspirin: Secondary | ICD-10-CM | POA: Diagnosis not present

## 2021-06-04 DIAGNOSIS — R109 Unspecified abdominal pain: Secondary | ICD-10-CM | POA: Diagnosis not present

## 2021-06-04 DIAGNOSIS — R0789 Other chest pain: Secondary | ICD-10-CM | POA: Insufficient documentation

## 2021-06-04 DIAGNOSIS — R079 Chest pain, unspecified: Secondary | ICD-10-CM

## 2021-06-04 LAB — BASIC METABOLIC PANEL
Anion gap: 7 (ref 5–15)
BUN: 18 mg/dL (ref 6–20)
CO2: 24 mmol/L (ref 22–32)
Calcium: 8.8 mg/dL — ABNORMAL LOW (ref 8.9–10.3)
Chloride: 103 mmol/L (ref 98–111)
Creatinine, Ser: 0.82 mg/dL (ref 0.44–1.00)
GFR, Estimated: >60 mL/min (ref >60)
Glucose, Bld: 124 mg/dL — ABNORMAL HIGH (ref 70–99)
Potassium: 4 mmol/L (ref 3.5–5.1)
Sodium: 134 mmol/L — ABNORMAL LOW (ref 135–145)

## 2021-06-04 LAB — I-STAT BETA HCG BLOOD, ED (MC, WL, AP ONLY): I-stat hCG, quantitative: <5 m[IU]/mL (ref <5)

## 2021-06-04 LAB — CBC
HCT: 37 % (ref 36.0–46.0)
Hemoglobin: 12 g/dL (ref 12.0–15.0)
MCH: 26.2 pg (ref 26.0–34.0)
MCHC: 32.4 g/dL (ref 30.0–36.0)
MCV: 80.8 fL (ref 80.0–100.0)
Platelets: 252 10*3/uL (ref 150–400)
RBC: 4.58 MIL/uL (ref 3.87–5.11)
RDW: 13.6 % (ref 11.5–15.5)
WBC: 7.2 10*3/uL (ref 4.0–10.5)
nRBC: 0 % (ref 0.0–0.2)

## 2021-06-04 LAB — TROPONIN I (HIGH SENSITIVITY): Troponin I (High Sensitivity): <2 ng/L (ref <18)

## 2021-06-04 MED ORDER — ONDANSETRON 4 MG PO TBDP: 4.0000 mg | ORAL_TABLET | Freq: Three times a day (TID) | ORAL | 0 refills | Status: DC | PRN

## 2021-06-04 NOTE — ED Provider Notes (Signed)
?[facility] ?Provider Note ? ? ?CSN: [id number] ?Arrival date & time: 06/04/21  0911 ? ?  ? ?History ? ?Chief Complaint  ?Patient presents with  ? Chest Pain  ? ? ?Lynn [name] is a 36 y.o. female. ? ? ?Chest Pain ?Associated symptoms: abdominal pain, dizziness and vomiting   ?Patient presents with chest pain.  Has had on and off for the last 3 days.  States it is in her upper back and in her chest.  Comes to the arms at times.  Feels some nausea and has had some vomiting.  No fevers or chills.  Is on Ozempic.  No known cardiac history.  Has had previous tachycardia and is on Coreg because of that.  States she also has an umbilical hernia that had been bothering him more but that has become less painful.  Abdomen had not been swollen.  Patient states she will feel her heart going fast sometimes.  Not necessarily always when she is feeling dizzy however. ?  ? ?Home Medications ?Prior to Admission medications   ?Medication Sig Start Date End Date Taking? Authorizing Provider  ?acetaminophen (TYLENOL) 500 MG tablet Take 1,000 mg by mouth every 6 (six) hours as needed for moderate pain or headache.   Yes [provider]  ?aspirin EC 325 MG tablet Take 325 mg by mouth daily.   Yes [provider]  ?carvedilol (COREG) 25 MG tablet Take 1 tablet (25 mg total) by mouth 2 (two) times daily with a meal. TAKE 1 TABLET(25 MG) BY MOUTH TWICE DAILY WITH A MEAL ?Patient taking differently: Take 25 mg by mouth 2 (two) times daily with a meal. 10/13/20  Yes Lesleigh Noe, MD  ?methocarbamol (ROBAXIN) 500 MG tablet Take 1 tablet (500 mg total) by mouth 2 (two) times daily as needed for muscle spasms. 04/21/21 07/30/21 Yes Michela Pitcher, NP  ?omeprazole (PRILOSEC) 20 MG capsule Take 1 capsule (20 mg total) by mouth daily. 04/21/21  Yes Michela Pitcher, NP  ?ondansetron (ZOFRAN-ODT) 4 MG disintegrating tablet Take 1 tablet (4 mg total) by mouth every 8 (eight) hours as needed for  nausea or vomiting. 06/04/21  Yes Davonna Belling, MD  ?Semaglutide,0.25 or 0.'5MG'$ /DOS, (OZEMPIC, 0.25 OR 0.5 MG/DOSE,) 2 MG/1.5ML SOPN Inject 0.5 mg into the skin once a week. 05/12/21  Yes Lesleigh Noe, MD  ?FLUoxetine (PROZAC) 10 MG tablet Take 1 tablet (10 mg total) by mouth daily. Take 1/2 tablet per day. ?Patient not taking: Reported on 03/10/2021 10/13/20   Lesleigh Noe, MD  ?   ? ?Allergies    ?Macrobid [nitrofurantoin macrocrystal] and Ibuprofen   ? ?Review of Systems   ?Review of Systems  ?Constitutional:  Negative for appetite change.  ?Cardiovascular:  Positive for chest pain.  ?Gastrointestinal:  Positive for abdominal pain and vomiting.  ?Neurological:  Positive for dizziness.  ? ?Physical Exam ?Updated Vital Signs ?BP 127/73   Pulse 88   Temp (!) 97.5 ?F (36.4 ?C) (Oral)   Resp (!) 22   LMP 05/21/2021   SpO2 98%  ?Physical Exam ?Vitals and nursing note reviewed.  ?Constitutional:   ?   Appearance: She is obese.  ?Cardiovascular:  ?   Rate and Rhythm: Normal rate.  ?Pulmonary:  ?   Breath sounds: No rhonchi.  ?Chest:  ?   Chest wall: No tenderness.  ?Abdominal:  ?   Comments: Umbilical hernia.  Somewhat tender but is reducible.  ?Musculoskeletal:  ?  Right lower leg: No edema.  ?   Left lower leg: No edema.  ?Skin: ?   General: Skin is warm.  ?Neurological:  ?   Mental Status: She is alert.  ? ? ?ED Results / Procedures / Treatments   ?Labs ?(all labs ordered are listed, but only abnormal results are displayed) ?Labs Reviewed  ?BASIC METABOLIC PANEL - Abnormal; Notable for the following components:  ?    Result Value  ? Sodium 134 (*)   ? Glucose, Bld 124 (*)   ? Calcium 8.8 (*)   ? All other components within normal limits  ?CBC  ?I-STAT BETA HCG BLOOD, ED ([facility], [facility], [facility] ONLY)  ?TROPONIN I (HIGH SENSITIVITY)  ? ? ?EKG ?EKG Interpretation ? ?Date/Time:  Sunday June 04 2021 09:18:50 EST ?Ventricular Rate:  85 ?PR Interval:  164 ?QRS Duration: 100 ?QT Interval:  388 ?QTC Calculation: 461 ?R  Axis:   -4 ?Text Interpretation: Normal sinus rhythm Low voltage QRS Borderline ECG When compared with ECG of 09-Mar-2021 22:57, No significant change since last tracing Confirmed by Davonna Belling [phone]) on 06/04/2021 9:21:17 AM ? ?Radiology ?DG Chest 2 View ? ?Result Date: 06/04/2021 ?CLINICAL DATA:  36 year old female with history of chest pain radiating into both arms. Nausea and vomiting. EXAM: CHEST - 2 VIEW COMPARISON:  Chest x-ray 03/10/2021. FINDINGS: Lung volumes are normal. No consolidative airspace disease. No pleural effusions. No pneumothorax. No pulmonary nodule or mass noted. Pulmonary vasculature and the cardiomediastinal silhouette are within normal limits. IMPRESSION: No radiographic evidence of acute cardiopulmonary disease. Electronically Signed   By: Vinnie Langton M.D.   On: 06/04/2021 10:03   ? ?Procedures ?Procedures  ? ? ?Medications Ordered in ED ?Medications - No data to display ? ?ED Course/ Medical Decision Making/ A&P ?  ?                        ?Medical Decision Making ?Amount and/or Complexity of Data Reviewed ?Labs: ordered. ?Radiology: ordered. ? ?Risk ?Prescription drug management. ? ? ?Patient presents with chest pain.  Anterior chest.  Goes to shoulders and arms.  Also some new dizziness and nausea.  Is had for around 3 days.  Not exertional.  States she just feels bad.  States she may have some anxiety with a 2.  Initial differential diagnosis for chest pain is broad and does include life-threatening conditions.  EKG reassuring.  Chest x-ray independently interpreted and reassuring.  Doubt cardiac ischemia as a cause.  Doubt pneumonia.  Benign abdominal exam.  Did have umbilical hernia but it is reducible.  Doubt cause of the chest pain.  Appears stable for discharge home.  Outpatient follow-up as needed.  Patient is on Ozempic and could cause some of the symptoms ? ? ? ? ? ? ? ?Final Clinical Impression(s) / ED Diagnoses ?Final diagnoses:  ?Nonspecific chest pain  ? ? ?Rx /  DC Orders ?ED Discharge Orders   ? ?      Ordered  ?  ondansetron (ZOFRAN-ODT) 4 MG disintegrating tablet  Every 8 hours PRN       ? 06/04/21 1120  ? ?  ?  ? ?  ? ? ?  ?Davonna Belling, MD ?06/04/21 1529 ? ?

## 2021-06-04 NOTE — ED Triage Notes (Signed)
C/o intermittent pain to center of chest, bilateral shoulder pain, bilateral arm pain, dizziness, sob, nausea, and vomiting x 3 days. ?

## 2021-06-07 ENCOUNTER — Other Ambulatory Visit: Payer: Self-pay

## 2021-06-07 ENCOUNTER — Ambulatory Visit: Payer: Managed Care, Other (non HMO) | Admitting: Physical Therapy

## 2021-06-07 ENCOUNTER — Encounter: Payer: Self-pay | Admitting: Physical Therapy

## 2021-06-07 DIAGNOSIS — M25512 Pain in left shoulder: Secondary | ICD-10-CM

## 2021-06-07 DIAGNOSIS — R293 Abnormal posture: Secondary | ICD-10-CM | POA: Diagnosis not present

## 2021-06-07 DIAGNOSIS — M6281 Muscle weakness (generalized): Secondary | ICD-10-CM

## 2021-06-07 NOTE — Therapy (Addendum)
OUTPATIENT PHYSICAL THERAPY TREATMENT NOTE   Patient Name: Lynn [name] MRN: [id number] DOB:1986/03/27, 36 y.o., female Today's Date: 06/07/2021  PCP: Lesleigh Noe, MD REFERRING PROVIDER: Lesleigh Noe, MD   PT End of Session - 06/07/21 1344     Visit Number 2    Number of Visits 13    Date for PT Re-Evaluation 07/13/21    Authorization Type [employer]    Progress Note Due on Visit 10    Activity Tolerance Patient tolerated treatment well    Behavior During Therapy [facility] for tasks assessed/performed           In: 12:18 Out: 1:00p Total time: 42 min  Past Medical History:  Diagnosis Date   Carpal tunnel syndrome, left    Diabetes ([place])    Hypertension    Iron deficiency    Panic attacks    PCOS (polycystic ovarian syndrome)    Tachycardia    Past Surgical History:  Procedure Laterality Date   ADENOIDECTOMY     CESAREAN SECTION     TONSILLECTOMY     Patient Active Problem List   Diagnosis Date Noted   Acute pain of left shoulder 04/21/2021   Nerve pain 04/21/2021   Sore throat 04/21/2021   Morbid obesity ([place]) 10/13/2020   Essential hypertension 03/12/2019   Chronic tachycardia 03/12/2019   Type 2 diabetes mellitus with other specified complication ([place]) 37/01/6268   Generalized anxiety disorder with panic attacks 03/12/2019   Depression, major, single episode, complete remission ([place]) 03/12/2019   Family history of heart attack 03/12/2019   LVH (left ventricular hypertrophy) due to hypertensive disease, without heart failure 03/12/2019    THERAPY DIAG:  Abnormal posture  Muscle weakness (generalized)  Acute pain of left shoulder  REFERRING DIAG: Acute pain of left shoulder  PERTINENT HISTORY: Area of concern on the anterior shoulder near the neck base feels like muscle tension/knot. Mass present on left collar bone area. Per patient report, she continues to have intermittent pain and tingling. States her doctors told her she could be pinching a  nerve.  PRECAUTIONS/RESTRICTIONS:   none  SUBJECTIVE:  Pt reports that she continues to have significant L shoulder pain and paraesthesia into her L arm.  She has been HEP compliant; she feels this is somewhat helpful.  PAIN:  Are you having pain? Yes NPRS scale: 6/10 max, 3-4 current Pain location: left shoulder  Pain orientation: Left  PAIN TYPE: tingling down into hand and all 5 fingers, soreness around left shoulder Pain description: intermittent  Aggravating factors: laying on left shoulder to sleep, shoveling and lifting Relieving factors: N/A  OBJECTIVE:  UPPER EXTREMITY AROM/PROM:   A/PROM Right 06/01/2021 Left 06/01/2021  Shoulder flexion 180 170**  Shoulder extension      Shoulder abduction 180 162**  Shoulder adduction      Shoulder internal rotation 90 70**  Shoulder external rotation 75 75**  Elbow flexion      Elbow extension      Wrist flexion      Wrist extension      Wrist ulnar deviation      Wrist radial deviation      Wrist pronation      Wrist supination      (Blank rows = not tested)   UPPER EXTREMITY MMT:   MMT Right 06/01/2021 Left 06/01/2021  Shoulder flexion 4+ 4-*  Shoulder extension      Shoulder abduction 4+ 3+*  Shoulder adduction      Shoulder internal  rotation 4+ 4  Shoulder external rotation 4+ 4  Middle trapezius   3+*  Lower trapezius   3*  Elbow flexion      Elbow extension      Wrist flexion      Wrist extension      Wrist ulnar deviation      Wrist radial deviation      Wrist pronation      Wrist supination      Grip strength (lbs) 75 lbs 30 lbs  (Blank rows = not tested)   TREATMENT 06/07/21:  Therapeutic Exercise: - UBE 2.5'/2.5' fwd and backward for warm up while taking subjective - standing row 17# - 3x10 - standing shoulder ext - 17# - 3x10 - chin tuck - reviewing HEP and use of tennis ball peanut  Manual Therapy: - skilled palpation for MT - STM L UT - sub occipital release - chin tuck with OP  Trigger  Point Dry-Needling  Treatment instructions: Expect mild to moderate muscle soreness. S/S of pneumothorax if dry needled over a lung field, and to seek immediate medical attention should they occur. Patient verbalized understanding of these instructions and education.  Patient Consent Given: Yes Education handout provided: No Muscles treated: L UT Electrical stimulation performed: No Parameters: N/A Treatment response/outcome: Twitch response elicited and Palpable decrease in muscle tension   HOME EXERCISE PROGRAM: Access Code: [email or web address] URL: [email or web address] Date: 06/07/2021 Prepared by: Shearon Balo  Exercises Seated Scapular Retraction - 1 x daily - 7 x weekly - 31 sets - 10 reps - 8 sec hold Single Arm Doorway Pec Stretch at 90 Degrees Abduction - 1 x daily - 7 x weekly - 2-3 reps - 30sec hold Seated Passive Cervical Retraction - 1 x daily - 7 x weekly - 3 sets - 10 reps Sidelying Open Book Thoracic Lumbar Rotation and Extension - 1 x daily - 7 x weekly - 3 sets - 10 reps      ASSESSMENT:   CLINICAL IMPRESSION: [name] is progressing well with therapy.  Sxs seem to have significant contribution from cervical spine with reproduction and then improvement with sub occipital release.  Tennis ball peanut issued for home use.  Updated HEP.  Pt asked about lifting restrictions at work.  I let her know that it would be good to limit the amount of heavy lifting at work while we work on her shoulder/neck pain, but she would have to get an Scientist, clinical (histocompatibility and immunogenetics) from her MD.  Continue per POC.     OBJECTIVE IMPAIRMENTS decreased activity tolerance, decreased ROM, decreased strength, and pain.    ACTIVITY LIMITATIONS  lifting items at work, sleeping, cleaning .    PERSONAL FACTORS Time since onset of injury/illness/exacerbation and 3+ comorbidities: carpal tunnel sx, diabetes, HTN  are also affecting patient's functional outcome.      REHAB POTENTIAL: Good   CLINICAL  DECISION MAKING: Evolving/moderate complexity   EVALUATION COMPLEXITY: Moderate     GOALS: Goals reviewed with patient? No   SHORT TERM GOALS:   STG Name Target Date Goal status  1 Patient will be independent with HEP for PT progression. Baseline: initial HEP provided 06/22/2021 INITIAL  2 Patient will demonstrate >/= 4/5 MMT for left shoulder musculature.  Baseline:  06/22/2021 INITIAL  3 Patient will report 2/10 left shoulder pain when lifting <5 pounds overhead.  Baseline: 5/10 left shoulder pain when lifting left arm into [facility] 06/22/2021 INITIAL  4 Patient will demonstrate improved awareness of correct body posture when performing  activities at work.  Baseline: demonstrates rounded shoulders and forward head posture  06/22/2021 INITIAL    LONG TERM GOALS:    LTG Name Target Date Goal status  1 Patient will score a 70 on the FOTO to demonstrate improved tolerance to perform functional activities.  Baseline: 53 07/13/2021 INITIAL  2 Patient will be able to lift 75 pounds overhead without left shoulder pain to mimic work related duties.  Baseline: painful to lift left arm into flexion 07/13/2021 INITIAL  3 Patient will demonstrate 180 degrees of pain free left shoulder flexion and abduction.  Baseline: 170 degrees flexion with pain and 162 degrees abduction with pain 07/13/2021 INITIAL    PLAN: PT FREQUENCY: 2x/week   PT DURATION: 6 weeks   PLANNED INTERVENTIONS: Therapeutic exercises, Therapeutic activity, Neuromuscular re-education, Balance training, Gait training, Patient/Family education, Joint mobilization, Dry Needling, Cryotherapy, Moist heat, and Manual therapy   PLAN FOR NEXT SESSION: Manual therapy to left upper trap, lower trap strengthening, cervical stretching, postural correction exercises. Consider dry needling.   Check all possible CPT codes: 97110- Therapeutic Exercise, [phone]- Neuro Re-education, 97140 - Manual Therapy, 97530 - Therapeutic Activities, 97535 - Self  Care, and 97014 - Electrical stimulation (unattended)                                 If treatment provided at initial evaluation, no treatment charged due to lack of authorization.                  Kevan Ny Ahsha Hinsley PT 06/07/2021, 1:48 PM

## 2021-06-10 ENCOUNTER — Other Ambulatory Visit: Payer: Self-pay

## 2021-06-10 ENCOUNTER — Encounter: Payer: Self-pay | Admitting: Physical Therapy

## 2021-06-10 ENCOUNTER — Ambulatory Visit: Payer: Managed Care, Other (non HMO) | Admitting: Physical Therapy

## 2021-06-10 DIAGNOSIS — M25512 Pain in left shoulder: Secondary | ICD-10-CM

## 2021-06-10 DIAGNOSIS — R293 Abnormal posture: Secondary | ICD-10-CM

## 2021-06-10 DIAGNOSIS — M6281 Muscle weakness (generalized): Secondary | ICD-10-CM

## 2021-06-10 NOTE — Therapy (Signed)
OUTPATIENT PHYSICAL THERAPY TREATMENT NOTE   Patient Name: Lynn [name] MRN: [id number] DOB:[birth year], 36 y.o., female Today's Date: 06/10/2021  PCP: Lynn Noe, MD REFERRING PROVIDER: Lesleigh Noe, MD   PT End of Session - 06/10/21 0908     Visit Number 3    Number of Visits 13    Date for PT Re-Evaluation 07/13/21    Authorization Type [employer]    Progress Note Due on Visit 10    PT Start Time 0905    PT Stop Time 0945    PT Time Calculation (min) 40 min    Activity Tolerance Patient tolerated treatment well    Behavior During Therapy [facility] for tasks assessed/performed             Past Medical History:  Diagnosis Date   Carpal tunnel syndrome, left    Diabetes ([place])    Hypertension    Iron deficiency    Panic attacks    PCOS (polycystic ovarian syndrome)    Tachycardia    Past Surgical History:  Procedure Laterality Date   [place]     Patient Active Problem List   Diagnosis Date Noted   Acute pain of left shoulder 04/21/2021   Nerve pain 04/21/2021   Sore throat 04/21/2021   Morbid obesity ([place]) 10/13/2020   Essential hypertension 03/12/2019   Chronic tachycardia 03/12/2019   Type 2 diabetes mellitus with other specified complication ([place]) 56/43/3295   Generalized anxiety disorder with panic attacks 03/12/2019   Depression, major, single episode, complete remission ([place]) 03/12/2019   Family history of heart attack 03/12/2019   LVH (left ventricular hypertrophy) due to hypertensive disease, without heart failure 03/12/2019    THERAPY DIAG:  Abnormal posture  Muscle weakness (generalized)  Acute pain of left shoulder  REFERRING DIAG: Acute pain of left shoulder  PERTINENT HISTORY: Area of concern on the anterior shoulder near the neck base feels like muscle tension/knot. Mass present on left collar bone area. Per patient report, she continues to have intermittent pain and tingling. States her  doctors told her she could be pinching a nerve.  PRECAUTIONS/RESTRICTIONS:   none  SUBJECTIVE:  Pt reports that TDN was very helpful.  PAIN:  Are you having pain? Yes NPRS scale: 6/10 max, 3 current Pain location: left shoulder  Pain orientation: Left  PAIN TYPE: tingling down into hand and all 5 fingers, soreness around left shoulder Pain description: intermittent  Aggravating factors: laying on left shoulder to sleep, shoveling and lifting Relieving factors: N/A  OBJECTIVE:  UPPER EXTREMITY AROM/PROM:   A/PROM Right 06/01/2021 Left 06/01/2021  Shoulder flexion 180 170**  Shoulder extension      Shoulder abduction 180 162**  Shoulder adduction      Shoulder internal rotation 90 70**  Shoulder external rotation 75 75**  Elbow flexion      Elbow extension      Wrist flexion      Wrist extension      Wrist ulnar deviation      Wrist radial deviation      Wrist pronation      Wrist supination      (Blank rows = not tested)   UPPER EXTREMITY MMT:   MMT Right 06/01/2021 Left 06/01/2021  Shoulder flexion 4+ 4-*  Shoulder extension      Shoulder abduction 4+ 3+*  Shoulder adduction      Shoulder internal rotation 4+ 4  Shoulder external  rotation 4+ 4  Middle trapezius   3+*  Lower trapezius   3*  Elbow flexion      Elbow extension      Wrist flexion      Wrist extension      Wrist ulnar deviation      Wrist radial deviation      Wrist pronation      Wrist supination      Grip strength (lbs) 75 lbs 30 lbs  (Blank rows = not tested)   TREATMENT 06/07/21:  Therapeutic Exercise: - UBE 2.5'/2.5' fwd and backward for warm up while taking subjective - unilateral row - 17# - 20x ea - standing shoulder ext - unilateral - 13# ea - 20x ea - lat pull down - 3x10  - 35# - KB OH press 5# KB - 10x  Manual Therapy: - skilled palpation for TDN - STM L UT, L infraspinatus, L rhomboid/mid trap  Trigger Point Dry-Needling  Treatment instructions: Expect mild to moderate  muscle soreness. S/S of pneumothorax if dry needled over a lung field, and to seek immediate medical attention should they occur. Patient verbalized understanding of these instructions and education.  Patient Consent Given: Yes Education handout provided: No Muscles treated: L UT, L infraspinatus, L sub scap, L rhomboid/mid trap Electrical stimulation performed: No Parameters: N/A Treatment response/outcome: Twitch response elicited and Palpable decrease in muscle tension   HOME EXERCISE PROGRAM: Access Code: [email or web address] URL: [email or web address] Date: 06/07/2021 Prepared by: Shearon Balo  Exercises Seated Scapular Retraction - 1 x daily - 7 x weekly - 31 sets - 10 reps - 8 sec hold Single Arm Doorway Pec Stretch at 90 Degrees Abduction - 1 x daily - 7 x weekly - 2-3 reps - 30sec hold Seated Passive Cervical Retraction - 1 x daily - 7 x weekly - 3 sets - 10 reps Sidelying Open Book Thoracic Lumbar Rotation and Extension - 1 x daily - 7 x weekly - 3 sets - 10 reps      ASSESSMENT:   CLINICAL IMPRESSION: Lynn [name] is progressing well with therapy.  Pt reports no increase in baseline pain following therapy.  Today we concentrated on periscapular strengthening and pain reduction.  Pt responding well to TDN + strengthening; will continue to progress.  Will consider needling LS next visit.  Pt will continue to benefit from skilled physical therapy to address remaining deficits and achieve listed goals.  Continue per POC.     OBJECTIVE IMPAIRMENTS decreased activity tolerance, decreased ROM, decreased strength, and pain.    ACTIVITY LIMITATIONS  lifting items at work, sleeping, cleaning .    PERSONAL FACTORS Time since onset of injury/illness/exacerbation and 3+ comorbidities: carpal tunnel sx, diabetes, HTN  are also affecting patient's functional outcome.      REHAB POTENTIAL: Good   CLINICAL DECISION MAKING: Evolving/moderate complexity   EVALUATION COMPLEXITY:  Moderate     GOALS: Goals reviewed with patient? No   SHORT TERM GOALS:   STG Name Target Date Goal status  1 Patient will be independent with HEP for PT progression. Baseline: initial HEP provided 06/22/2021 INITIAL  2 Patient will demonstrate >/= 4/5 MMT for left shoulder musculature.  Baseline:  06/22/2021 INITIAL  3 Patient will report 2/10 left shoulder pain when lifting <5 pounds overhead.  Baseline: 5/10 left shoulder pain when lifting left arm into [facility] 06/22/2021 INITIAL  4 Patient will demonstrate improved awareness of correct body posture when performing activities at work.  Baseline: demonstrates rounded shoulders and forward  head posture  06/22/2021 INITIAL    LONG TERM GOALS:    LTG Name Target Date Goal status  1 Patient will score a 70 on the FOTO to demonstrate improved tolerance to perform functional activities.  Baseline: 53 07/13/2021 INITIAL  2 Patient will be able to lift 75 pounds overhead without left shoulder pain to mimic work related duties.  Baseline: painful to lift left arm into flexion 07/13/2021 INITIAL  3 Patient will demonstrate 180 degrees of pain free left shoulder flexion and abduction.  Baseline: 170 degrees flexion with pain and 162 degrees abduction with pain 07/13/2021 INITIAL    PLAN: PT FREQUENCY: 2x/week   PT DURATION: 6 weeks   PLANNED INTERVENTIONS: Therapeutic exercises, Therapeutic activity, Neuromuscular re-education, Balance training, Gait training, Patient/Family education, Joint mobilization, Dry Needling, Cryotherapy, Moist heat, and Manual therapy   PLAN FOR NEXT SESSION: Manual therapy to left upper trap, lower trap strengthening, cervical stretching, postural correction exercises. Consider dry needling.   Check all possible CPT codes: 97110- Therapeutic Exercise, [phone]- Neuro Re-education, 97140 - Manual Therapy, 97530 - Therapeutic Activities, 97535 - Self Care, and 97014 - Electrical stimulation (unattended)                                  If treatment provided at initial evaluation, no treatment charged due to lack of authorization.                  Kevan Ny Donne Baley PT 06/10/2021, 9:44 AM

## 2021-06-14 ENCOUNTER — Ambulatory Visit: Payer: Managed Care, Other (non HMO) | Admitting: Physical Therapy

## 2021-06-16 ENCOUNTER — Ambulatory Visit: Payer: Managed Care, Other (non HMO) | Admitting: Physical Therapy

## 2021-08-09 ENCOUNTER — Ambulatory Visit (INDEPENDENT_AMBULATORY_CARE_PROVIDER_SITE_OTHER): Payer: Managed Care, Other (non HMO) | Admitting: Family Medicine

## 2021-08-09 ENCOUNTER — Other Ambulatory Visit (HOSPITAL_COMMUNITY)
Admission: RE | Admit: 2021-08-09 | Discharge: 2021-08-09 | Disposition: A | Discharge status: Home / Self Care | Payer: Managed Care, Other (non HMO) | Source: Ambulatory Visit | Attending: Family Medicine | Admitting: Family Medicine

## 2021-08-09 VITALS — BP 120/70 | HR 81 | Temp 97.3°F | Ht 66.0 in | Wt 298.0 lb

## 2021-08-09 DIAGNOSIS — Z124 Encounter for screening for malignant neoplasm of cervix: Secondary | ICD-10-CM | POA: Diagnosis not present

## 2021-08-09 DIAGNOSIS — F411 Generalized anxiety disorder: Secondary | ICD-10-CM

## 2021-08-09 DIAGNOSIS — Z3009 Encounter for other general counseling and advice on contraception: Secondary | ICD-10-CM | POA: Diagnosis not present

## 2021-08-09 DIAGNOSIS — Z0000 Encounter for general adult medical examination without abnormal findings: Secondary | ICD-10-CM | POA: Diagnosis not present

## 2021-08-09 DIAGNOSIS — F410 Panic disorder [episodic paroxysmal anxiety] without agoraphobia: Secondary | ICD-10-CM

## 2021-08-09 MED ORDER — HYDROXYZINE HCL 25 MG PO TABS
25.0000 mg | ORAL_TABLET | Freq: Three times a day (TID) | ORAL | 0 refills | Status: DC | PRN
Start: 2021-08-09 — End: 2021-12-22

## 2021-08-09 NOTE — Assessment & Plan Note (Signed)
She stopped fluoxetine and feels she is doing relatively okay with therapy alone.  Recent family stressors, with more frequent anxiety episodes.  Still less than weekly discussed trial of hydroxyzine 25 to 50 mg as needed for acute anxiety. ?

## 2021-08-09 NOTE — Progress Notes (Signed)
Annual Exam  ? ?Chief Complaint:  ?Chief Complaint  ?Patient presents with  ? Annual Exam  ? Gynecologic Exam  ?  Thinks her last one was at least 4 years ago. Will request records.   ? ? ?History of Present Illness:  ?Ms. Lynn [name] is a 36 y.o. No obstetric history on file. who LMP was Patient's last menstrual period was 08/05/2021 (exact date)., presents today for her annual examination.   ? ?Sister had another heart attack ?Stopped prozac while she was in therapy ?Has had more panic/anxiety - tries to ground herself ?-- recently a few times a month ?-- previously just once a month ? ? ?Nutrition/Lifestyle ?Diet: taking ozempic, working on diet ?Exercise: not currently ?She does not get adequate calcium and Vitamin D in her diet. ? ?Social History  ? ?Tobacco Use  ?Smoking Status Former  ? Packs/day: 1.00  ? Years: 7.00  ? Pack years: 7.00  ? Types: Cigarettes, E-cigarettes  ? Quit date: 03/19/2014  ? Years since quitting: 7.3  ?Smokeless Tobacco Never  ? ?Social History  ? ?Substance and Sexual Activity  ?Alcohol Use Yes  ? Comment: rarely  ? ?Social History  ? ?Substance and Sexual Activity  ?Drug Use No  ? ? ? ?Safety ?The patient wears seatbelts: yes.     ?The patient feels safe at home and in their relationships: yes. ? ?General Health ?Dentist in the last year: No ?Eye doctor: no ? ?Menstrual ?Cycles variable - sometimes once a month, but last month bleed for 3 weeks ? ?GYN ?She is single partner, contraception - condoms most of the time.  ? ? ? ?Cervical Cancer Screening (Age 62-65) ?Last Pap:  3-4 years ago ? ?Family History of Breast Cancer: no ?Family History of Ovarian Cancer: no ? ? ? ?Weight ?Wt Readings from Last 3 Encounters:  ?08/09/21 298 lb (135.2 kg)  ?05/12/21 289 lb 4 oz (131.2 kg)  ?04/21/21 289 lb 4 oz (131.2 kg)  ? ?Patient has high BMI  ?BMI Readings from Last 1 Encounters:  ?08/09/21 48.10 kg/m?  ? ? ? ?Chronic disease screening ?Blood pressure monitoring:  ?BP Readings from Last 3  Encounters:  ?08/09/21 120/70  ?06/04/21 127/73  ?05/12/21 120/60  ? ? ? ?Lipid Monitoring: Indication for screening: age >84, obesity, diabetes, family hx, CV risk factors.  ?Lipid screening: Yes ? ?Lab Results  ?Component Value Date  ? CHOL 152 10/13/2020  ? HDL 38.60 (L) 10/13/2020  ? Barry 76 10/13/2020  ? TRIG 188.0 (H) 10/13/2020  ? CHOLHDL 4 10/13/2020  ? ? ? ?Diabetes Screening: age >33, overweight, family hx, PCOS, hx of gestational diabetes, at risk ethnicity, elevated blood pressure >135/80.  ?Diabetes Screening screening: Yes ? ?Lab Results  ?Component Value Date  ? HGBA1C 5.9 (A) 05/12/2021  ? ? ? ? ?Past Medical History:  ?Diagnosis Date  ? Carpal tunnel syndrome, left   ? Diabetes ([place])   ? Hypertension   ? Iron deficiency   ? Panic attacks   ? PCOS (polycystic ovarian syndrome)   ? Tachycardia   ? ? ?Past Surgical History:  ?Procedure Laterality Date  ? ADENOIDECTOMY    ? CESAREAN SECTION    ? TONSILLECTOMY    ? ? ?Prior to Admission medications   ?Medication Sig Start Date End Date Taking? Authorizing Provider  ?acetaminophen (TYLENOL) 500 MG tablet Take 1,000 mg by mouth every 6 (six) hours as needed for moderate pain or headache.   Yes [provider]  ?  aspirin EC 325 MG tablet Take 325 mg by mouth daily.   Yes [provider]  ?carvedilol (COREG) 25 MG tablet Take 1 tablet (25 mg total) by mouth 2 (two) times daily with a meal. TAKE 1 TABLET(25 MG) BY MOUTH TWICE DAILY WITH A MEAL ?Patient taking differently: Take 25 mg by mouth 2 (two) times daily with a meal. 10/13/20  Yes Lesleigh Noe, MD  ?Semaglutide,0.25 or 0.'5MG'$ /DOS, (OZEMPIC, 0.25 OR 0.5 MG/DOSE,) 2 MG/1.5ML SOPN Inject 0.5 mg into the skin once a week. 05/12/21  Yes Lesleigh Noe, MD  ? ? ?Allergies  ?Allergen Reactions  ? Macrobid [Nitrofurantoin Macrocrystal] Shortness Of Breath and Nausea And Vomiting  ? Ibuprofen Hives  ? ? ?Gynecologic History: Patient's last menstrual period was 08/05/2021 (exact  date). ? ?Obstetric History: No obstetric history on file. ? ?Social History  ? ?Socioeconomic History  ? Marital status: Single  ?  Spouse name: Not on file  ? Number of children: 1  ? Years of education: some college  ? Highest education level: Not on file  ?Occupational History  ? Not on file  ?Tobacco Use  ? Smoking status: Former  ?  Packs/day: 1.00  ?  Years: 7.00  ?  Pack years: 7.00  ?  Types: Cigarettes, E-cigarettes  ?  Quit date: 03/19/2014  ?  Years since quitting: 7.3  ? Smokeless tobacco: Never  ?Vaping Use  ? Vaping Use: Never used  ?Substance and Sexual Activity  ? Alcohol use: Yes  ?  Comment: rarely  ? Drug use: No  ? Sexual activity: Not Currently  ?Other Topics Concern  ? Not on file  ?Social History Narrative  ? 03/12/19  ? From: [place], moved here 2015  ? Living: sister and her husband  ? Work: works for [employer]  ?   ? Family: Daughter - Scarlet (2015), gets along with sister   ?   ? Enjoys: eating out (before covid), go to the movies  ?   ? Exercise: walking and moving more at work  ? Diet: does not do great with diabetic, eating less overall  ?   ? Safety  ? Seat belts: Yes   ? Guns: No  ? Safe in relationships: Yes   ? ?Social Determinants of Health  ? ?Financial Resource Strain: Not on file  ?Food Insecurity: Not on file  ?Transportation Needs: Not on file  ?Physical Activity: Not on file  ?Stress: Not on file  ?Social Connections: Not on file  ?Intimate Partner Violence: Not on file  ? ? ?Family History  ?Problem Relation Age of Onset  ? Heart disease Mother   ? Diabetes Mother   ? Hypertension Mother   ? Kidney disease Mother   ? Heart failure Mother   ? Heart attack Mother 62  ? Arthritis Mother   ? Asthma Mother   ? COPD Mother   ? Depression Mother   ? Hyperlipidemia Mother   ? Mental illness Mother   ? Heart disease Father   ? Heart attack Father 66  ? COPD Father   ? Hyperlipidemia Father   ? Hypertension Father   ? Mental illness Father   ? Arthritis Sister   ? Depression Sister    ? Diabetes Sister   ? Hypertension Sister   ? Mental illness Sister   ? Miscarriages / Stillbirths Sister   ? Heart failure Maternal Grandmother   ? Arthritis Maternal Grandmother   ? Alcohol abuse Maternal Grandmother   ?  COPD Maternal Grandmother   ? Diabetes Maternal Grandmother   ? Hearing loss Maternal Grandmother   ? Heart disease Maternal Grandmother   ? Hyperlipidemia Maternal Grandmother   ? Hypertension Maternal Grandmother   ? Stroke Maternal Grandmother   ? Miscarriages / Stillbirths Maternal Grandmother   ? Mental illness Maternal Grandmother   ? Heart attack Maternal Grandmother   ? Heart failure Maternal Grandfather   ? Alcohol abuse Maternal Grandfather   ? Arthritis Maternal Grandfather   ? Early death Maternal Grandfather   ? Heart disease Maternal Grandfather   ? Hyperlipidemia Maternal Grandfather   ? Hypertension Maternal Grandfather   ? Heart attack Maternal Grandfather 60  ? Mental illness Maternal Grandfather   ? Heart failure Paternal Grandmother   ? Arthritis Paternal Grandmother   ? Diabetes Paternal Grandmother   ? Heart attack Paternal Grandmother   ? Miscarriages / Stillbirths Paternal Grandmother   ? Mental illness Paternal Grandmother   ? Kidney disease Paternal Grandmother   ? Intellectual disability Paternal Grandmother   ? Hypertension Paternal Grandmother   ? Hyperlipidemia Paternal Grandmother   ? Heart disease Paternal Grandmother   ? Heart failure Paternal Grandfather   ? Arthritis Paternal Grandfather   ? Heart attack Paternal Grandfather   ? Mental illness Paternal Grandfather   ? Hearing loss Paternal Grandfather   ? Heart disease Paternal Grandfather   ? Hyperlipidemia Paternal Grandfather   ? Hypertension Paternal Grandfather   ? Alcohol abuse Sister   ? Depression Sister   ? Diabetes Sister   ? Heart disease Sister   ? Heart attack Sister 23  ? Arthritis Sister   ? Kidney disease Sister   ? Transient ischemic attack Sister   ? ? ?Review of Systems  ?Constitutional:   Negative for chills and fever.  ?HENT:  Negative for congestion and sore throat.   ?Eyes:  Negative for blurred vision and double vision.  ?Respiratory:  Negative for shortness of breath.   ?Cardiovascular:  Negative

## 2021-08-11 LAB — CYTOLOGY - PAP
Comment: NEGATIVE
Diagnosis: NEGATIVE
High risk HPV: NEGATIVE

## 2021-08-14 ENCOUNTER — Telehealth: Payer: Self-pay

## 2021-08-14 NOTE — Telephone Encounter (Signed)
Left v/m requesting pt to call [facility] for appt. Sending note to lsc support. ?

## 2021-08-14 NOTE — Telephone Encounter (Signed)
[facility] - Client ?Nonclinical Telephone Record  ?[facility]? ?Client [facility] - Client ?Client Site [place] ?Provider Waunita Schooner- MD ?Contact Type Call ?Who Is Calling Patient / Member / Family / Caregiver ?Caller Name Tiane Szydlowski ?Caller Phone Number [phone] ?Patient Name Lynn [name] ?Patient DOB [birth year] ?Call Type Message Only Information Provided ?Reason for Call Request for General Office Information ?Initial Comment Caller states she was wanting to know if there is anyway she can see the doc today. ?Additional Comment Office hours provided. ?Disp. Time Disposition Final User ?08/14/2021 7:34:13 AM General Information Provided Yes Vena Rua ?Call Closed By: Vena Rua ?Transaction Date/Time: 08/14/2021 7:31:31 AM (ET ?

## 2021-08-15 ENCOUNTER — Ambulatory Visit (INDEPENDENT_AMBULATORY_CARE_PROVIDER_SITE_OTHER)
Admission: RE | Admit: 2021-08-15 | Discharge: 2021-08-15 | Disposition: A | Discharge status: Home / Self Care | Payer: Managed Care, Other (non HMO) | Source: Ambulatory Visit | Attending: Family Medicine | Admitting: Family Medicine

## 2021-08-15 ENCOUNTER — Ambulatory Visit (INDEPENDENT_AMBULATORY_CARE_PROVIDER_SITE_OTHER): Payer: Managed Care, Other (non HMO) | Admitting: Family Medicine

## 2021-08-15 ENCOUNTER — Encounter: Payer: Self-pay | Admitting: Family Medicine

## 2021-08-15 ENCOUNTER — Other Ambulatory Visit: Payer: Self-pay | Admitting: Family Medicine

## 2021-08-15 VITALS — BP 126/84 | HR 87 | Ht 66.0 in | Wt 295.2 lb

## 2021-08-15 DIAGNOSIS — R000 Tachycardia, unspecified: Secondary | ICD-10-CM

## 2021-08-15 DIAGNOSIS — R1013 Epigastric pain: Secondary | ICD-10-CM | POA: Insufficient documentation

## 2021-08-15 DIAGNOSIS — R072 Precordial pain: Secondary | ICD-10-CM | POA: Diagnosis not present

## 2021-08-15 DIAGNOSIS — F411 Generalized anxiety disorder: Secondary | ICD-10-CM

## 2021-08-15 DIAGNOSIS — R079 Chest pain, unspecified: Secondary | ICD-10-CM | POA: Insufficient documentation

## 2021-08-15 DIAGNOSIS — F410 Panic disorder [episodic paroxysmal anxiety] without agoraphobia: Secondary | ICD-10-CM

## 2021-08-15 DIAGNOSIS — I10 Essential (primary) hypertension: Secondary | ICD-10-CM

## 2021-08-15 DIAGNOSIS — R0789 Other chest pain: Secondary | ICD-10-CM | POA: Insufficient documentation

## 2021-08-15 DIAGNOSIS — I119 Hypertensive heart disease without heart failure: Secondary | ICD-10-CM | POA: Diagnosis not present

## 2021-08-15 DIAGNOSIS — M25512 Pain in left shoulder: Secondary | ICD-10-CM

## 2021-08-15 MED ORDER — FAMOTIDINE 20 MG PO TABS: 20.0000 mg | ORAL_TABLET | Freq: Two times a day (BID) | ORAL | 1 refills | Status: DC

## 2021-08-15 NOTE — Assessment & Plan Note (Addendum)
More stressed lately -and anxious  ?This may add to fluctuating bp and chest discomfort  ? ?Sister is in end stage renal failure  ?

## 2021-08-15 NOTE — Assessment & Plan Note (Addendum)
bp in fair control at this time  ?Per pt was elevated at home late last week when feeling bad  ?Her cuff did run high in the office (139 syst) ?BP Readings from Last 1 Encounters:  ?08/15/21 126/84  ?carvedilol 25 mg bid (for this and tachycardia) ?No changes needed ?Most recent labs reviewed  ?Disc lifstyle change with low sodium diet and exercise  ? ?

## 2021-08-15 NOTE — Assessment & Plan Note (Addendum)
L anterior lateral  ?Atypical and cw and also abd pain  ?Goes to shoulder but she also has a shoulder condition  ?Some rib tenderness ?More tenderness in epigastrium and LUQ of abdomen however  ?? If possible gastritis  ?Will try pepcid 20 mg bid and update  ?ekg is unchanged from prev - NSR with poor R wave progression  ?cxr ordered  ? ?Plan f/u with pcp  ?

## 2021-08-15 NOTE — Assessment & Plan Note (Signed)
No sob per pt  ?

## 2021-08-15 NOTE — Assessment & Plan Note (Signed)
Had to stop PT when she had the flu  ?Pos Neer test today ?

## 2021-08-15 NOTE — Telephone Encounter (Signed)
I spoke with pt; pt said on 08/11/21 at night BP 188/117 P thinks in 150s. On reck BP 165/ something and not sure what P was. Pt BP this morning at 7:30 BP 144/96 P 80. Pt just woke up. BP now is 140/89 P 84.  Pt has not had SOB or dizziness. H/A now is pain level of 5. Last night pt had short episode of lt sided CP and lt arm pain. Pt said recently been having problems with lt shoulder pain on and off. No CP or lt arm or shoulder pain now. Pt has not gone to UC or ED since 08/11/21 when symptoms started. Pt said she has not been upset, anxious or angry. Pt already scheduled my chart appt that was cancelled because pt would like to be seen today. Pt scheduled appt with Dr Glori Bickers 08/15/21 at 2:30. UC & ED precautions given and pt voiced understanding.Sending note to Dr Glori Bickers and Louretta Shorten CMA who is working with Dr Glori Bickers today and will teams Tarsha. ?

## 2021-08-15 NOTE — Assessment & Plan Note (Signed)
Well controlled with carvedilol 25 mg bid  ? ?

## 2021-08-15 NOTE — Progress Notes (Signed)
? ?Subjective:  ? ? Patient ID: Lynn [name], female    DOB: Apr 16, 1985, 36 y.o.   MRN: [id number] ? ?HPI ?36 yo pt of Dr Einar Pheasant presents with c/o headache and elevated bp ? ?H/o DM and HTN and GAD  ?Has h/o LVH  ? ?Wt Readings from Last 3 Encounters:  ?08/15/21 295 lb 3.2 oz (133.9 kg)  ?08/09/21 298 lb (135.2 kg)  ?05/12/21 289 lb 4 oz (131.2 kg)  ? ?47.65 kg/m? ? ? ?Noted inc bp on Friday with systolic 481E/563J (digital cuff) ?Had eaten some very salty mexicam food  ?Telt tired / just feels off today ?Some pain in L shoulder (hard to tell if it is new or not)-- it does occ radiate to shoulder  ?Feels swollen there also - did go to PT  ? ?Felt nauseated but did not throw up  ?Has slept a lot  ? ? ?Occ gets under rib cage on L  ?No heartburn  ?No burping  ? ?Not much appetite ?No diarrhea  ? ?No sob  ?Vapes - but not in the past 3 days  ?Some headache  ? ?EKG today notes NSR with poor R wave progression  ?Low voltage QRS was noted in the past  ? ? ?BP Readings from Last 3 Encounters:  ?08/15/21 126/84  ?08/09/21 120/70  ?06/04/21 127/73  ? ?Pulse Readings from Last 3 Encounters:  ?08/15/21 87  ?08/09/21 81  ?06/04/21 88  ? ? ?History of HTN and chronic tachycardia  ?Taking carvedilol 25 mg bid  ? ?DM2 ?Lab Results  ?Component Value Date  ? HGBA1C 5.9 (A) 05/12/2021  ? ?Was on semaglutide  ? ?Saw cardiology in 2018 for atypical cp ?Had echo then with EF of 65-70% with nl valves  ? ? ? ?Review of Systems ? ?   ?Objective:  ? Physical Exam ?Constitutional:   ?   General: She is not in acute distress. ?   Appearance: Normal appearance. She is well-developed. She is obese. She is not ill-appearing or diaphoretic.  ?HENT:  ?   Head: Normocephalic and atraumatic.  ?   Mouth/Throat:  ?   Mouth: Mucous membranes are moist.  ?Eyes:  ?   General: No scleral icterus. ?   Conjunctiva/sclera: Conjunctivae normal.  ?   Pupils: Pupils are equal, round, and reactive to light.  ?Neck:  ?   Thyroid: No thyromegaly.  ?   Vascular: No  carotid bruit or JVD.  ?   Comments: Some tenderness in L trapezius area  ? ?Cardiovascular:  ?   Rate and Rhythm: Normal rate and regular rhythm.  ?   Pulses: Normal pulses.  ?   Heart sounds: Normal heart sounds.  ?  No gallop.  ?Pulmonary:  ?   Effort: Pulmonary effort is normal. No respiratory distress.  ?   Breath sounds: Normal breath sounds. No stridor. No wheezing, rhonchi or rales.  ?   Comments: L chest wall (anterolat) tenderness noted ?No crepitus /step off or skin changes  ? ?Chest:  ?   Chest wall: Tenderness present.  ?Abdominal:  ?   General: Abdomen is protuberant. Bowel sounds are normal. There is no distension or abdominal bruit.  ?   Palpations: Abdomen is soft. There is no fluid wave, hepatomegaly, splenomegaly, mass or pulsatile mass.  ?   Tenderness: There is abdominal tenderness in the epigastric area and left upper quadrant. There is no right CVA tenderness, left CVA tenderness, guarding or rebound. Negative signs include Murphy's  sign and McBurney's sign.  ?Musculoskeletal:  ?   Cervical back: Normal range of motion and neck supple.  ?   Right lower leg: No edema.  ?   Left lower leg: No edema.  ?Lymphadenopathy:  ?   Cervical: No cervical adenopathy.  ?Skin: ?   General: Skin is warm and dry.  ?   Coloration: Skin is not jaundiced or pale.  ?   Findings: No bruising, erythema or rash.  ?Neurological:  ?   Mental Status: She is alert.  ?   Cranial Nerves: No cranial nerve deficit.  ?   Motor: No weakness.  ?   Coordination: Coordination normal.  ?   Deep Tendon Reflexes: Reflexes are normal and symmetric. Reflexes normal.  ?Psychiatric:     ?   Mood and Affect: Mood is anxious.  ?   Comments: Mildly anxious  ?Pleasant  ? ?  ? ? ? ? ? ?   ?Assessment & Plan:  ? ?Problem List Items Addressed This Visit   ? ?  ? Cardiovascular and Mediastinum  ? Essential hypertension  ?  bp in fair control at this time  ?Per pt was elevated at home late last week when feeling bad  ?Her cuff did run high in  the office (139 syst) ?BP Readings from Last 1 Encounters:  ?08/15/21 126/84  ?carvedilol 25 mg bid (for this and tachycardia) ?No changes needed ?Most recent labs reviewed  ?Disc lifstyle change with low sodium diet and exercise  ? ?  ?  ? Relevant Orders  ? EKG 12-Lead (Completed)  ? LVH (left ventricular hypertrophy) due to hypertensive disease, without heart failure  ?  No sob per pt  ? ?  ?  ?  ? Other  ? Acute pain of left shoulder  ?  Had to stop PT when she had the flu  ?Pos Neer test today ? ?  ?  ? Chest pain - Primary  ?  L anterior lateral  ?Atypical and cw and also abd pain  ?Goes to shoulder but she also has a shoulder condition  ?Some rib tenderness ?More tenderness in epigastrium and LUQ of abdomen however  ?? If possible gastritis  ?Will try pepcid 20 mg bid and update  ?ekg is unchanged from prev - NSR with poor R wave progression  ?cxr ordered  ? ?Plan f/u with pcp  ? ?  ?  ? Relevant Orders  ? EKG 12-Lead (Completed)  ? DG Chest 2 View  ? Chronic tachycardia  ?  Well controlled with carvedilol 25 mg bid  ? ? ?  ?  ? Relevant Orders  ? EKG 12-Lead (Completed)  ? Epigastric pain  ?  Tender on exam  ?Assoc with chest pain  ? ?Plan to try pepcid 20 mg bid for possible gastritis ?inst to avoid nsaids  ?F/u with pcp in 2 wk or earlier if needed ? ?  ?  ? Generalized anxiety disorder with panic attacks  ?  More stressed lately -and anxious  ?This may add to fluctuating bp and chest discomfort  ? ?Sister is in end stage renal failure  ? ?  ?  ? ? ?

## 2021-08-15 NOTE — Patient Instructions (Addendum)
Start some generic pepcid 20 mg twice daily  ?This is for stomach acid  ? ?Ekg is stable  ?Let's check a chest xray  ? ?Take tylenol for your pain and drink lots of water for your headache  ? ?If symptoms worsen or change let us know  ? ?If severe, go to the ER  ? ?Follow up with Dr Einar Pheasant in about 2 weeks  ?

## 2021-08-15 NOTE — Assessment & Plan Note (Signed)
Tender on exam  ?Assoc with chest pain  ? ?Plan to try pepcid 20 mg bid for possible gastritis ?inst to avoid nsaids  ?F/u with pcp in 2 wk or earlier if needed ?

## 2021-08-16 ENCOUNTER — Ambulatory Visit: Payer: Managed Care, Other (non HMO) | Admitting: Family Medicine

## 2021-08-16 ENCOUNTER — Telehealth: Payer: Self-pay

## 2021-08-16 NOTE — Telephone Encounter (Signed)
Ok, thanks.

## 2021-08-16 NOTE — Telephone Encounter (Signed)
I have attempted without success to contact this patient by phone to discuss lab results.

## 2021-08-16 NOTE — Telephone Encounter (Signed)
-----   Message from Lesleigh Noe, MD sent at 08/16/2021  8:51 AM EDT ----- ?Please call patient to relay unread mychart message ? ?

## 2021-08-17 ENCOUNTER — Telehealth: Payer: Self-pay | Admitting: Family Medicine

## 2021-08-17 ENCOUNTER — Encounter: Payer: Self-pay | Admitting: Family Medicine

## 2021-08-17 MED ORDER — DOXYCYCLINE HYCLATE 100 MG PO TABS: 100.0000 mg | ORAL_TABLET | Freq: Two times a day (BID) | ORAL | 0 refills | Status: DC

## 2021-08-17 NOTE — Telephone Encounter (Signed)
I sent in doxycycline to take as directed   Keep posted If she develops fever or cough please alert me

## 2021-08-17 NOTE — Telephone Encounter (Signed)
-----   Message from Lynn [name], [place] sent at 08/17/2021 10:39 AM EDT ----- Pt notified of xray results and Dr. Marliss Coots comments.  Pharmacy is [facility]

## 2021-08-17 NOTE — Telephone Encounter (Signed)
Pt notified Rx sent to pharmacy and advised of Dr. Marliss Coots comments and will keep [place] posted

## 2021-08-29 ENCOUNTER — Ambulatory Visit: Payer: Managed Care, Other (non HMO) | Admitting: Family Medicine

## 2021-09-01 ENCOUNTER — Telehealth: Payer: Self-pay

## 2021-09-01 NOTE — Telephone Encounter (Signed)
I spoke with pt; pt said was seen in office 08/15/21 and given abx; pt said finished abx around 08/28/21.pt said after finishing abx she felt better but 2 days ago started with pain in collar bone and both shoulders when breathing. Today pt said she is hurting in diaphragm. Pt said mid CP does radiate into both shoulders. Pt has labored breathing today also. For the last 3 days pt is sleeping all night and still sleeps a lot during the day. Pt said she is super fatigued. No available appts at [facility] today. I advised per advise from 08/15/21 visit if condition changed or worsened to go to ED. Pt said when her boyfriend got there pt would go to ED. ED precautions given and pt voiced understanding. Sending note to Dr Einar Pheasant and [facility] CMA. Will also teams [place].

## 2021-09-01 NOTE — Telephone Encounter (Signed)
Pt's mychart response:  "It hurts through my collar bone and shoulders when I breathe in. Today jt has started hurting badly through my diaphragm as well. I never had any cough even when Doctor Tower diagnosed me from my chest x-Ray. Some shortness of breath but more almost like labored breaths. Super fatigued. No fever that I've noticed."   Message sent to triage nurse.

## 2021-09-01 NOTE — Telephone Encounter (Signed)
Patient called in stating that she finished the medication but now her symptoms are coming back. Wanting to know what she should do, offered an appointment on Monday but declined as she wants to know what to do today.

## 2021-09-01 NOTE — Telephone Encounter (Signed)
Mychart sent to pt.

## 2021-09-04 NOTE — Telephone Encounter (Signed)
Agree with recommendation.   Cannot see where patient went to the ER.   Routing to MA to call patient to check in and offer appt this morning

## 2021-09-13 ENCOUNTER — Ambulatory Visit: Payer: Managed Care, Other (non HMO) | Admitting: Family Medicine

## 2021-11-09 ENCOUNTER — Ambulatory Visit: Payer: Managed Care, Other (non HMO) | Admitting: Family Medicine

## 2021-11-26 ENCOUNTER — Other Ambulatory Visit: Payer: Self-pay | Admitting: Family Medicine

## 2021-11-26 DIAGNOSIS — I10 Essential (primary) hypertension: Secondary | ICD-10-CM

## 2021-11-26 DIAGNOSIS — R000 Tachycardia, unspecified: Secondary | ICD-10-CM

## 2021-11-28 NOTE — Telephone Encounter (Signed)
LVM for patient to give a call back and schedule.

## 2021-12-06 ENCOUNTER — Telehealth: Payer: Self-pay | Admitting: Family Medicine

## 2021-12-06 DIAGNOSIS — R000 Tachycardia, unspecified: Secondary | ICD-10-CM

## 2021-12-06 DIAGNOSIS — I10 Essential (primary) hypertension: Secondary | ICD-10-CM

## 2021-12-06 MED ORDER — CARVEDILOL 25 MG PO TABS: 25.0000 mg | ORAL_TABLET | Freq: Two times a day (BID) | ORAL | 0 refills | Status: DC

## 2021-12-06 NOTE — Telephone Encounter (Signed)
Patient returned call back. I let her know that she did not need her appointment tomorrow for med refill per notes. She said that she needed to come in and see Dr cody to discuss a few things.

## 2021-12-06 NOTE — Telephone Encounter (Signed)
Left message to return call to our office.  Per Dr. Einar Pheasant patient does not need to keep appointment if only for refills. She has called in.

## 2021-12-06 NOTE — Telephone Encounter (Signed)
Called and ldvm. Pt can still request med refills with current pcp. Rx request approved by Dr. Waunita Schooner

## 2021-12-06 NOTE — Telephone Encounter (Signed)
Caller Name: Pt Call back phone #:  [phone]  Reason for Call: Pt is making a TOC to [facility] on 9/22. She will be out of her heart med that she has been on for years. Can that be filled before that appt.

## 2021-12-06 NOTE — Telephone Encounter (Signed)
Carvedilol refilled.   I am happy to send in any other refills if needed.

## 2021-12-07 ENCOUNTER — Other Ambulatory Visit: Payer: Self-pay | Admitting: Family Medicine

## 2021-12-07 ENCOUNTER — Encounter: Payer: Self-pay | Admitting: Family Medicine

## 2021-12-07 ENCOUNTER — Ambulatory Visit (INDEPENDENT_AMBULATORY_CARE_PROVIDER_SITE_OTHER): Payer: Managed Care, Other (non HMO) | Admitting: Family Medicine

## 2021-12-07 VITALS — BP 122/70 | HR 72 | Temp 97.6°F | Wt 313.2 lb

## 2021-12-07 DIAGNOSIS — E785 Hyperlipidemia, unspecified: Secondary | ICD-10-CM

## 2021-12-07 DIAGNOSIS — M25512 Pain in left shoulder: Secondary | ICD-10-CM

## 2021-12-07 DIAGNOSIS — R0683 Snoring: Secondary | ICD-10-CM

## 2021-12-07 DIAGNOSIS — R5383 Other fatigue: Secondary | ICD-10-CM | POA: Diagnosis not present

## 2021-12-07 DIAGNOSIS — Z23 Encounter for immunization: Secondary | ICD-10-CM

## 2021-12-07 DIAGNOSIS — E1169 Type 2 diabetes mellitus with other specified complication: Secondary | ICD-10-CM | POA: Diagnosis not present

## 2021-12-07 DIAGNOSIS — M5412 Radiculopathy, cervical region: Secondary | ICD-10-CM | POA: Diagnosis not present

## 2021-12-07 DIAGNOSIS — M25511 Pain in right shoulder: Secondary | ICD-10-CM | POA: Diagnosis not present

## 2021-12-07 DIAGNOSIS — T733XXA Exhaustion due to excessive exertion, initial encounter: Secondary | ICD-10-CM | POA: Insufficient documentation

## 2021-12-07 DIAGNOSIS — E559 Vitamin D deficiency, unspecified: Secondary | ICD-10-CM

## 2021-12-07 LAB — LIPID PANEL
Cholesterol: 160 mg/dL (ref 0–200)
HDL: 47.4 mg/dL (ref >39.00)
LDL Cholesterol: 82 mg/dL (ref 0–99)
NonHDL: 113.09
Total CHOL/HDL Ratio: 3
Triglycerides: 154 mg/dL — ABNORMAL HIGH (ref 0.0–149.0)
VLDL: 30.8 mg/dL (ref 0.0–40.0)

## 2021-12-07 LAB — COMPREHENSIVE METABOLIC PANEL
ALT: 19 U/L (ref 0–35)
AST: 18 U/L (ref 0–37)
Albumin: 3.8 g/dL (ref 3.5–5.2)
Alkaline Phosphatase: 60 U/L (ref 39–117)
BUN: 14 mg/dL (ref 6–23)
CO2: 29 mEq/L (ref 19–32)
Calcium: 9 mg/dL (ref 8.4–10.5)
Chloride: 98 mEq/L (ref 96–112)
Creatinine, Ser: 0.77 mg/dL (ref 0.40–1.20)
GFR: 100.68 mL/min (ref >60.00)
Glucose, Bld: 114 mg/dL — ABNORMAL HIGH (ref 70–99)
Potassium: 4 mEq/L (ref 3.5–5.1)
Sodium: 135 mEq/L (ref 135–145)
Total Bilirubin: 0.4 mg/dL (ref 0.2–1.2)
Total Protein: 7.2 g/dL (ref 6.0–8.3)

## 2021-12-07 LAB — CBC
HCT: 36.6 % (ref 36.0–46.0)
Hemoglobin: 12 g/dL (ref 12.0–15.0)
MCHC: 32.9 g/dL (ref 30.0–36.0)
MCV: 78.4 fl (ref 78.0–100.0)
Platelets: 266 10*3/uL (ref 150.0–400.0)
RBC: 4.66 Mil/uL (ref 3.87–5.11)
RDW: 14.7 % (ref 11.5–15.5)
WBC: 7.5 10*3/uL (ref 4.0–10.5)

## 2021-12-07 LAB — FERRITIN: Ferritin: 9.2 ng/mL — ABNORMAL LOW (ref 10.0–291.0)

## 2021-12-07 LAB — VITAMIN D 25 HYDROXY (VIT D DEFICIENCY, FRACTURES): VITD: 16.92 ng/mL — ABNORMAL LOW (ref 30.00–100.00)

## 2021-12-07 LAB — HEMOGLOBIN A1C: Hgb A1c MFr Bld: 6.4 % (ref 4.6–6.5)

## 2021-12-07 LAB — VITAMIN B12: Vitamin B-12: 386 pg/mL (ref 211–911)

## 2021-12-07 MED ORDER — VITAMIN D (ERGOCALCIFEROL) 1.25 MG (50000 UNIT) PO CAPS: 50000.0000 [IU] | ORAL_CAPSULE | ORAL | 1 refills | Status: DC

## 2021-12-07 MED ORDER — PREDNISONE 10 MG PO TABS: ORAL_TABLET | ORAL | 0 refills | Status: AC

## 2021-12-07 NOTE — Assessment & Plan Note (Signed)
Persistent daytime sleepiness.  Suspect this might be sleep apnea but will evaluate for vitamin deficiencies.  She also notes her diet is not great and she has gained back some weight which may be contributing.

## 2021-12-07 NOTE — Telephone Encounter (Signed)
Noted  

## 2021-12-07 NOTE — Progress Notes (Signed)
Subjective:     Lynn [name] is a 36 y.o. female presenting for Medication Refill     HPI  #shoulder pain - went to PT for a few weeks - then got Covid and didn't return to PT - now she is having pain on the right shoulder - is also having nerve pain similar to carpel tunnel - down the arm on the right and left side - severe enough that she has to take her ring off - sometimes when driving her arm goes to sleep - will also get pins and needles symptoms in head - went to the ER march for cp rule out - entire arm from elbow down will be numb   Fatigue and excessive tiredness Snores at night Htn Obesity Mom noticed she stopped breathing when younger and heavier  Review of Systems   Social History   Tobacco Use  Smoking Status Former   Packs/day: 1.00   Years: 7.00   Total pack years: 7.00   Types: Cigarettes, E-cigarettes   Quit date: 10/29/2021   Years since quitting: 0.1  Smokeless Tobacco Never        Objective:    BP Readings from Last 3 Encounters:  12/07/21 122/70  08/15/21 126/84  08/09/21 120/70   Wt Readings from Last 3 Encounters:  12/07/21 (!) 313 lb 4 oz (142.1 kg)  08/15/21 295 lb 3.2 oz (133.9 kg)  08/09/21 298 lb (135.2 kg)    BP 122/70   Pulse 72   Temp 97.6 F (36.4 C) (Temporal)   Wt (!) 313 lb 4 oz (142.1 kg)   LMP 11/04/2021   SpO2 95%   BMI 50.56 kg/m    Physical Exam Constitutional:      General: She is not in acute distress.    Appearance: She is well-developed. She is not diaphoretic.  HENT:     Right Ear: External ear normal.     Left Ear: External ear normal.     Nose: Nose normal.  Eyes:     Conjunctiva/sclera: Conjunctivae normal.  Cardiovascular:     Rate and Rhythm: Normal rate.  Pulmonary:     Effort: Pulmonary effort is normal.  Musculoskeletal:     Cervical back: Neck supple.     Comments: Neck:  ROM: normal but pain with rotation Strength: normal [place] - unable to reproduce  numbness/tingling  Shoulder ROM and strength normal No ttp  Elbow Negative tinel  Wrist Neg tinel and phallen  Skin:    General: Skin is warm and dry.     Capillary Refill: Capillary refill takes less than 2 seconds.  Neurological:     Mental Status: She is alert. Mental status is at baseline.  Psychiatric:        Mood and Affect: Mood normal.        Behavior: Behavior normal.           Assessment & Plan:   Problem List Items Addressed This Visit       Endocrine   Type 2 diabetes mellitus with other specified complication [facility])    Lab Results  Component Value Date   HGBA1C 5.9 (A) 05/12/2021  Difficulty getting ozempic -pharmacy recently called and has a pended prescription for her.  Advised restarting given we are trying prednisone.  Follow-up in 1 month.       Relevant Orders   Hemoglobin A1c     Other   Acute pain of both shoulders    Previously seen for  the left shoulder now with right shoulder pain.  Offered physical therapy she declined at this time treat with prednisone which is being used for the neck pain.      Cervical radicular pain - Primary    Unable to reproduce any numbness in the hands, however suspect she might be having some nerve compression.  Discussed trial of prednisone.  Offered physical therapy, she will follow-up if no improvement with prednisone.  Discussed seeing for sports medicine if symptoms do not resolve.      Relevant Medications   predniSONE (DELTASONE) 10 MG tablet   Snoring    Daytime tiredness and a long history of snoring as well as some possible apnea.  Referral for sleep evaluation.      Relevant Orders   Ambulatory referral to Pulmonology   Other fatigue    Persistent daytime sleepiness.  Suspect this might be sleep apnea but will evaluate for vitamin deficiencies.  She also notes her diet is not great and she has gained back some weight which may be contributing.      Relevant Orders   Comprehensive metabolic  panel   CBC   Ferritin   Vitamin D, 25-hydroxy   Vitamin B12   Other Visit Diagnoses     Need for influenza vaccination       Relevant Orders   Flu Vaccine QUAD 81moIM (Fluarix, Fluzone & Alfiuria Quad PF) (Completed)   Hyperlipidemia associated with type 2 diabetes mellitus ([place]       Relevant Orders   Lipid panel        Return if symptoms worsen or fail to improve.  JLesleigh Noe MD

## 2021-12-07 NOTE — Patient Instructions (Signed)
Neck pain/numbness - steroids - if no improvement - physical therapy - follow-up with Dr. Lorelei Pont or sports medicine in [place] in 6 weeks if not improvement   Diabetes - restart ozempic  Fatigue/sleepiness - labs  - referral to pulm for sleep study  #Referral I have placed a referral to a specialist for you. You should receive a phone call from the specialty office. Make sure your voicemail is not full and that if you are able to answer your phone to unknown or new numbers.   It may take up to 2 weeks to hear about the referral. If you do not hear anything in 2 weeks, please call our office and ask to speak with the referral coordinator.

## 2021-12-07 NOTE — Assessment & Plan Note (Addendum)
Daytime tiredness and a long history of snoring as well as some possible apnea.  Referral for sleep evaluation.

## 2021-12-07 NOTE — Assessment & Plan Note (Signed)
Previously seen for the left shoulder now with right shoulder pain.  Offered physical therapy she declined at this time treat with prednisone which is being used for the neck pain.

## 2021-12-07 NOTE — Assessment & Plan Note (Signed)
Unable to reproduce any numbness in the hands, however suspect she might be having some nerve compression.  Discussed trial of prednisone.  Offered physical therapy, she will follow-up if no improvement with prednisone.  Discussed seeing for sports medicine if symptoms do not resolve.

## 2021-12-07 NOTE — Assessment & Plan Note (Addendum)
Lab Results  Component Value Date   HGBA1C 5.9 (A) 05/12/2021   Difficulty getting ozempic -pharmacy recently called and has a pended prescription for her.  Advised restarting given we are trying prednisone.  Follow-up in 1 month.

## 2021-12-21 ENCOUNTER — Institutional Professional Consult (permissible substitution): Payer: Managed Care, Other (non HMO) | Admitting: Nurse Practitioner

## 2021-12-21 NOTE — Progress Notes (Signed)
New Patient Visit  BP 106/70   Pulse 100   Temp (!) 96.8 F (36 C) (Temporal)   Ht '5\' 6"'$  (1.676 m)   Wt (!) 308 lb 6.4 oz (139.9 kg)   LMP 11/04/2021   SpO2 96%   BMI 49.78 kg/m    Subjective:    Patient ID: Lynn [name], female    DOB: [birth year], 36 y.o.   MRN: [id number]  CC: Chief Complaint  Patient presents with  . Transitions Of Care    TOC. Est care. Pt c/o hx of neck pain. Pt states she has had sore throat, fever, stuffy nose w/ dry cough x6 days.     HPI: Lynn [name] is a 36 y.o. female presents to transfer care to a new provider.  Introduced to Designer, jewellery role and practice setting.  All questions answered.  Discussed provider/patient relationship and expectations.  She has a history of shoulder pain and was following with her prior pcp for this.  She states that there was no originating injury.  The pain is worse in her left shoulder, however it sometimes hurts on her right shoulder as well.  She was given a prednisone taper which helped while she was taking it. She states that sometimes her left arm will go numb. She has not gone to PT yet.   She has been taking carvedilol for the past 7 years. She was having a resting heart rate in the 120s. Her dad passed away when he was 53 from a MI. She last went to a cardiologist several years ago.   She has a history of diabetes. She is currently taking Ozempic 0.5 mg injection weekly.  She has a little bit of nausea on the first day, however after that there are no side effects.  She is taking this to also help with weight loss.  She is not checking her sugars at home as her meter stopped working.  She denies chest pain, shortness of breath, numbness and tingling in her feet.  She has a history of anxiety has improved.  She states that she has panic attacks about once a month or every other month.  She was given hydroxyzine to take, however this made her very sleepy.  She is wondering if there is anything else to  take to help with her panic attacks.     12/07/2021   12:15 PM 08/15/2021    2:33 PM 08/09/2021    8:55 AM 10/13/2020   11:37 AM  Depression screen PHQ 2/9  Decreased Interest 1 0 0 0  Down, Depressed, Hopeless 1 0 1 0  PHQ - 2 Score 2 0 1 0  Altered sleeping '3  1 1  '$ Tired, decreased energy 1  1 0  Change in appetite 2  2 0  Feeling bad or failure about yourself  0  1 1  Trouble concentrating 0  0 0  Moving slowly or fidgety/restless 0  0 0  Suicidal thoughts 0  0 0  PHQ-9 Score '8  6 2  '$ Difficult doing work/chores Somewhat difficult  Somewhat difficult Somewhat difficult      10/13/2020   11:38 AM  GAD 7 : Generalized Anxiety Score  Nervous, Anxious, on Edge 2  Control/stop worrying 1  Worry too much - different things 2  Trouble relaxing 2  Restless 1  Easily annoyed or irritable 2  Afraid - awful might happen 2  Total GAD 7 Score 12  Anxiety Difficulty Somewhat difficult  Past Medical History:  Diagnosis Date  . Carpal tunnel syndrome, left   . Diabetes ([place])   . Hypertension   . Iron deficiency   . Panic attacks   . PCOS (polycystic ovarian syndrome)   . Tachycardia     Past Surgical History:  Procedure Laterality Date  . ADENOIDECTOMY    . CESAREAN SECTION    . TONSILLECTOMY      Family History  Problem Relation Age of Onset  . Heart disease Mother   . Diabetes Mother   . Hypertension Mother   . Kidney disease Mother   . Heart failure Mother   . Heart attack Mother 51  . Arthritis Mother   . Asthma Mother   . COPD Mother   . Depression Mother   . Hyperlipidemia Mother   . Mental illness Mother   . Heart disease Father   . Heart attack Father 76  . COPD Father   . Hyperlipidemia Father   . Hypertension Father   . Mental illness Father   . Arthritis Sister   . Depression Sister   . Diabetes Sister   . Hypertension Sister   . Mental illness Sister   . Miscarriages / Stillbirths Sister   . Heart failure Maternal Grandmother   . Arthritis  Maternal Grandmother   . Alcohol abuse Maternal Grandmother   . COPD Maternal Grandmother   . Diabetes Maternal Grandmother   . Hearing loss Maternal Grandmother   . Heart disease Maternal Grandmother   . Hyperlipidemia Maternal Grandmother   . Hypertension Maternal Grandmother   . Stroke Maternal Grandmother   . Miscarriages / Stillbirths Maternal Grandmother   . Mental illness Maternal Grandmother   . Heart attack Maternal Grandmother   . Heart failure Maternal Grandfather   . Alcohol abuse Maternal Grandfather   . Arthritis Maternal Grandfather   . Early death Maternal Grandfather   . Heart disease Maternal Grandfather   . Hyperlipidemia Maternal Grandfather   . Hypertension Maternal Grandfather   . Heart attack Maternal Grandfather 60  . Mental illness Maternal Grandfather   . Heart failure Paternal Grandmother   . Arthritis Paternal Grandmother   . Diabetes Paternal Grandmother   . Heart attack Paternal Grandmother   . Miscarriages / Stillbirths Paternal Grandmother   . Mental illness Paternal Grandmother   . Kidney disease Paternal Grandmother   . Intellectual disability Paternal Grandmother   . Hypertension Paternal Grandmother   . Hyperlipidemia Paternal Grandmother   . Heart disease Paternal Grandmother   . Heart failure Paternal Grandfather   . Arthritis Paternal Grandfather   . Heart attack Paternal Grandfather   . Mental illness Paternal Grandfather   . Hearing loss Paternal Grandfather   . Heart disease Paternal Grandfather   . Hyperlipidemia Paternal Grandfather   . Hypertension Paternal Grandfather   . Alcohol abuse Sister   . Depression Sister   . Diabetes Sister   . Heart disease Sister   . Heart attack Sister 58  . Arthritis Sister   . Kidney disease Sister   . Transient ischemic attack Sister      Social History   Tobacco Use  . Smoking status: Former    Packs/day: 1.00    Years: 7.00    Total pack years: 7.00    Types: Cigarettes,  E-cigarettes    Quit date: 10/29/2021    Years since quitting: 0.1  . Smokeless tobacco: Never  Vaping Use  . Vaping Use: Former  Substance Use Topics  .  Alcohol use: Yes    Comment: rarely  . Drug use: No    Current Outpatient Medications on File Prior to Visit  Medication Sig Dispense Refill  . acetaminophen (TYLENOL) 500 MG tablet Take 1,000 mg by mouth every 6 (six) hours as needed for moderate pain or headache.    Marland Kitchen aspirin EC 325 MG tablet Take 325 mg by mouth daily.    . carvedilol (COREG) 25 MG tablet Take 1 tablet (25 mg total) by mouth 2 (two) times daily with a meal. 180 tablet 0  . famotidine (PEPCID) 20 MG tablet TAKE 1 TABLET(20 MG) BY MOUTH TWICE DAILY 180 tablet 0  . Vitamin D, Ergocalciferol, (DRISDOL) 1.25 MG (50000 UNIT) CAPS capsule Take 1 capsule (50,000 Units total) by mouth every 7 (seven) days. 4 capsule 1   No current facility-administered medications on file prior to visit.     Review of Systems  Constitutional:  Positive for fatigue and fever.  HENT:  Positive for congestion (mild) and sore throat. Negative for ear pain and rhinorrhea.   Eyes: Negative.   Respiratory: Negative.    Cardiovascular: Negative.   Gastrointestinal: Negative.   Genitourinary: Negative.   Musculoskeletal:  Positive for arthralgias (left shoulder pain).  Skin: Negative.   Neurological: Negative.   Psychiatric/Behavioral:  The patient is nervous/anxious.       Objective:    BP 106/70   Pulse 100   Temp (!) 96.8 F (36 C) (Temporal)   Ht '5\' 6"'$  (1.676 m)   Wt (!) 308 lb 6.4 oz (139.9 kg)   LMP 11/04/2021   SpO2 96%   BMI 49.78 kg/m   Wt Readings from Last 3 Encounters:  12/22/21 (!) 308 lb 6.4 oz (139.9 kg)  12/07/21 (!) 313 lb 4 oz (142.1 kg)  08/15/21 295 lb 3.2 oz (133.9 kg)    BP Readings from Last 3 Encounters:  12/22/21 106/70  12/07/21 122/70  08/15/21 126/84    Physical Exam Vitals and nursing note reviewed.  Constitutional:      General: She is not  in acute distress.    Appearance: Normal appearance.  HENT:     Head: Normocephalic.     Right Ear: Tympanic membrane, ear canal and external ear normal.     Left Ear: Tympanic membrane, ear canal and external ear normal.     Mouth/Throat:     Pharynx: Posterior oropharyngeal erythema present. No oropharyngeal exudate.  Eyes:     Conjunctiva/sclera: Conjunctivae normal.  Cardiovascular:     Rate and Rhythm: Normal rate and regular rhythm.     Pulses: Normal pulses.     Heart sounds: Normal heart sounds.  Pulmonary:     Effort: Pulmonary effort is normal.     Breath sounds: Normal breath sounds.  Abdominal:     Palpations: Abdomen is soft.     Tenderness: There is no abdominal tenderness.  Musculoskeletal:        General: Tenderness (Left subscapular area) present. Normal range of motion.     Cervical back: Normal range of motion and neck supple. No tenderness.     Right lower leg: No edema.     Left lower leg: No edema.  Lymphadenopathy:     Cervical: No cervical adenopathy.  Skin:    General: Skin is warm.  Neurological:     General: No focal deficit present.     Mental Status: She is alert and oriented to person, place, and time.  Psychiatric:  Mood and Affect: Mood normal.        Behavior: Behavior normal.        Thought Content: Thought content normal.        Judgment: Judgment normal.      Assessment & Plan:   Problem List Items Addressed This Visit       Cardiovascular and Mediastinum   Essential hypertension    Chronic, stable.  BP today 106/70.  She is taking carvedilol 25 mg twice daily for elevated heart rate.  Heart rate today is 100.  Recent CMP and CBC reviewed.  Follow-up in 6 months.        Endocrine   Type 2 diabetes mellitus with other specified complication (HCC) - Primary    Chronic, stable.  Most recent A1c was 6.4% on 12/07/2021.  We will check a urine microalbumin today.  Encouraged her to get a yearly eye exam.  We will continue  increasing her Ozempic to 1 mg injection weekly.  Follow-up in 3 months.      Relevant Medications   Semaglutide, 1 MG/DOSE, 4 MG/3ML SOPN   Other Relevant Orders   Microalbumin / creatinine urine ratio     Other   Chronic tachycardia    Chronic, stable.  Continue carvedilol 25 mg twice daily.      Generalized anxiety disorder with panic attacks    Chronic, improving.  She states that her anxiety has improved over the past few years.  She only has panic attacks about once every month or every other month.  We will have her start duloxetine 30 mg daily to help with anxiety along with cervical radiculopathy.  We will also have her start BuSpar 5 mg as needed for panic attack.  Follow-up in 6 to 8 weeks.      Relevant Medications   DULoxetine (CYMBALTA) 30 MG capsule   busPIRone (BUSPAR) 5 MG tablet   Depression, major, single episode, complete remission (HCC)    Chronic, resolved.  She is restarting duloxetine for anxiety.      Relevant Medications   DULoxetine (CYMBALTA) 30 MG capsule   busPIRone (BUSPAR) 5 MG tablet   Morbid obesity (HCC)    BMI is 49.7.  She has lost 5 pounds since her last visit, congratulated her on this!  Continue Ozempic for diabetes and weight management.      Relevant Medications   Semaglutide, 1 MG/DOSE, 4 MG/3ML SOPN   Acute pain of both shoulders    She has been having ongoing pain in her left shoulder with possible trigger points palpated.  We will place a referral to sports medicine for possible injections along with physical therapy.  Follow-up in 6 to 8 weeks.      Relevant Orders   Ambulatory referral to Sports Medicine   Ambulatory referral to Physical Therapy   Cervical radicular pain    She is not currently having any numbness right now.  She states that the prednisone did help while she was taking it, however it has worsened again since being off the medication.  We will have her start duloxetine 30 mg daily.  Referral placed to sports  medicine and physical therapy.  Follow-up in 6 to 8 weeks.      Relevant Orders   Ambulatory referral to Sports Medicine   Ambulatory referral to Physical Therapy   Other Visit Diagnoses     Sore throat       After staying in a house with possible mildew. Negative strep  and covid. Cont. tylenol as needed for pain. Can gargle with salt water. F/U if not improving.    Relevant Orders   POC COVID-19 BinaxNow (Completed)   POCT rapid strep A (Completed)        Follow up plan: Return in about 6 weeks (around 02/02/2022) for 6-8 weeks anxiety, shoulder pain.

## 2021-12-22 ENCOUNTER — Encounter: Payer: Self-pay | Admitting: Nurse Practitioner

## 2021-12-22 ENCOUNTER — Ambulatory Visit (INDEPENDENT_AMBULATORY_CARE_PROVIDER_SITE_OTHER): Payer: Managed Care, Other (non HMO) | Admitting: Nurse Practitioner

## 2021-12-22 VITALS — BP 106/70 | HR 100 | Temp 96.8°F | Ht 66.0 in | Wt 308.4 lb

## 2021-12-22 DIAGNOSIS — I10 Essential (primary) hypertension: Secondary | ICD-10-CM | POA: Diagnosis not present

## 2021-12-22 DIAGNOSIS — F411 Generalized anxiety disorder: Secondary | ICD-10-CM | POA: Diagnosis not present

## 2021-12-22 DIAGNOSIS — R000 Tachycardia, unspecified: Secondary | ICD-10-CM

## 2021-12-22 DIAGNOSIS — F325 Major depressive disorder, single episode, in full remission: Secondary | ICD-10-CM

## 2021-12-22 DIAGNOSIS — E1169 Type 2 diabetes mellitus with other specified complication: Secondary | ICD-10-CM | POA: Diagnosis not present

## 2021-12-22 DIAGNOSIS — J029 Acute pharyngitis, unspecified: Secondary | ICD-10-CM

## 2021-12-22 DIAGNOSIS — M25512 Pain in left shoulder: Secondary | ICD-10-CM

## 2021-12-22 DIAGNOSIS — M25511 Pain in right shoulder: Secondary | ICD-10-CM

## 2021-12-22 DIAGNOSIS — M5412 Radiculopathy, cervical region: Secondary | ICD-10-CM

## 2021-12-22 DIAGNOSIS — F410 Panic disorder [episodic paroxysmal anxiety] without agoraphobia: Secondary | ICD-10-CM

## 2021-12-22 LAB — POC COVID19 BINAXNOW: SARS Coronavirus 2 Ag: NEGATIVE

## 2021-12-22 LAB — POCT RAPID STREP A (OFFICE): Rapid Strep A Screen: NEGATIVE

## 2021-12-22 MED ORDER — SEMAGLUTIDE (1 MG/DOSE) 4 MG/3ML ~~LOC~~ SOPN: 1.0000 mg | PEN_INJECTOR | SUBCUTANEOUS | 0 refills | Status: DC

## 2021-12-22 MED ORDER — DULOXETINE HCL 30 MG PO CPEP: 30.0000 mg | ORAL_CAPSULE | Freq: Every day | ORAL | 1 refills | Status: DC

## 2021-12-22 MED ORDER — BUSPIRONE HCL 5 MG PO TABS: 5.0000 mg | ORAL_TABLET | Freq: Two times a day (BID) | ORAL | 1 refills | Status: DC | PRN

## 2021-12-22 MED ORDER — ONETOUCH ULTRA 2 W/DEVICE KIT: 1.0000 | PACK | Freq: Every day | 0 refills | Status: DC

## 2021-12-22 MED ORDER — ONETOUCH ULTRA VI STRP: ORAL_STRIP | 12 refills | Status: DC

## 2021-12-22 MED ORDER — ONETOUCH CLUB LANCETS FINE PT MISC: 1.0000 | Freq: Every day | 11 refills | Status: AC

## 2021-12-22 NOTE — Patient Instructions (Signed)
It was great to see you!  I have placed a referral to PT and sports medicine for your shoulder pain and numbness.   Start duloxetine 1 capsule daily to help with anxiety and the pain/numbness. You can also take buspar 1 tablet as needed for panic attack.   You can continue tylenol as needed for sore throat and fever.   Let's follow-up in 6-8 weeks, sooner if you have concerns.  If a referral was placed today, you will be contacted for an appointment. Please note that routine referrals can sometimes take up to 3-4 weeks to process. Please call our office if you haven't heard anything after this time frame.  Take care,  Vance Peper, NP

## 2021-12-22 NOTE — Assessment & Plan Note (Signed)
Chronic, stable.  BP today 106/70.  She is taking carvedilol 25 mg twice daily for elevated heart rate.  Heart rate today is 100.  Recent CMP and CBC reviewed.  Follow-up in 6 months.

## 2021-12-22 NOTE — Assessment & Plan Note (Signed)
She has been having ongoing pain in her left shoulder with possible trigger points palpated.  We will place a referral to sports medicine for possible injections along with physical therapy.  Follow-up in 6 to 8 weeks.

## 2021-12-22 NOTE — Assessment & Plan Note (Signed)
Chronic, improving.  She states that her anxiety has improved over the past few years.  She only has panic attacks about once every month or every other month.  We will have her start duloxetine 30 mg daily to help with anxiety along with cervical radiculopathy.  We will also have her start BuSpar 5 mg as needed for panic attack.  Follow-up in 6 to 8 weeks.

## 2021-12-22 NOTE — Assessment & Plan Note (Signed)
Chronic, resolved.  She is restarting duloxetine for anxiety.

## 2021-12-22 NOTE — Assessment & Plan Note (Signed)
Chronic, stable.  Continue carvedilol 25 mg twice daily.

## 2021-12-22 NOTE — Assessment & Plan Note (Signed)
She is not currently having any numbness right now.  She states that the prednisone did help while she was taking it, however it has worsened again since being off the medication.  We will have her start duloxetine 30 mg daily.  Referral placed to sports medicine and physical therapy.  Follow-up in 6 to 8 weeks.

## 2021-12-22 NOTE — Assessment & Plan Note (Signed)
BMI is 49.7.  She has lost 5 pounds since her last visit, congratulated her on this!  Continue Ozempic for diabetes and weight management.

## 2021-12-22 NOTE — Assessment & Plan Note (Signed)
Chronic, stable.  Most recent A1c was 6.4% on 12/07/2021.  We will check a urine microalbumin today.  Encouraged her to get a yearly eye exam.  We will continue increasing her Ozempic to 1 mg injection weekly.  Follow-up in 3 months.

## 2021-12-22 NOTE — Addendum Note (Signed)
Addended by: Adora Fridge on: 12/22/2021 03:34 PM   Modules accepted: Orders

## 2021-12-23 LAB — MICROALBUMIN / CREATININE URINE RATIO
Creatinine, Urine: 193 mg/dL (ref 20–275)
Microalb Creat Ratio: 7 mcg/mg creat (ref <30)
Microalb, Ur: 1.3 mg/dL

## 2021-12-26 ENCOUNTER — Institutional Professional Consult (permissible substitution): Payer: Managed Care, Other (non HMO) | Admitting: Pulmonary Disease

## 2022-01-01 NOTE — Progress Notes (Signed)
Benito Mccreedy D.[place] Phone: [phone]   Assessment and Plan:     1. Chronic pain of both shoulders 2. Tendinitis of left rotator cuff  -Chronic with exacerbation, initial sports medicine visit - Most consistent with rotator cuff tendinopathy versus subacromial bursitis versus impingement syndrome of left shoulder.  Not consistent with frozen shoulder as patient's A1c is well controlled and in prediabetic range, and patient has mostly retained ROM and is still having pain with no period of "freezing" - Patient elected for subacromial CSI.  Tolerated well per note below.  Corticosteroid may  may temporarily increase blood glucose with past medical history of DM type II - Start HEP - Elected to not use NSAID course as patient states that she has allergy to ibuprofen - Patient's failed therapies in the past include temporary relief with prednisone p.o. that returned after course completion, limited relief with muscle relaxers, limited relief with short-term PT - Suspect that patient's neck pain, trapezius pain, right shoulder pain, have all resulted from overuse and compensation from original left shoulder pain.  We will start treatment with left shoulder and see which symptoms remain  Procedure: Subacromial Injection Side: left  Risks explained and consent was given verbally. The site was cleaned with alcohol prep. A steroid injection was performed from posterior approach using 39m of 1% lidocaine without epinephrine and 136mof kenalog 4056ml. This was well tolerated and resulted in symptomatic relief.  Needle was removed, hemostasis achieved, and post injection instructions were explained.   Pt was advised to call or return to clinic if these symptoms worsen or fail to improve as anticipated.   Pertinent previous records reviewed include left shoulder x-ray 04/21/2021, family medicine note 12/07/2021, internal  medicine note 12/22/2021   Follow Up: 3 weeks for reevaluation.  Could consider ultrasound versus intra-articular CSI based on patient's symptoms   Subjective:   I, Moenique Parris, am serving as a scrEducation administratorr Doctor BenGlennon Machief Complaint: bilateral shoulder pain   HPI:   01/02/2022 Patient is a 36 68ar old female complaining of bilateral shoulder pain. Patient states She states that there was no originating injury, been going on for a year now .  The pain is worse in her left shoulder, however it sometimes hurts on her right shoulder as well.  She was given a prednisone taper which helped while she was taking it. She states that sometimes her left arm will go numb. She has not gone to PT yet. When it gets really bad she is really TTP  , muscle relaxors are ineffective   shoulder pain - went to PT for a few weeks - then got Covid and didn't return to PT - now she is having pain on the right shoulder - is also having nerve pain similar to carpel tunnel - down the arm on the right and left side - severe enough that she has to take her ring off - sometimes when driving her arm goes to sleep - will also get pins and needles symptoms in head - went to the ER march for cp rule out - entire arm from elbow down will be numb      Relevant Historical Information: DM type II  Additional pertinent review of systems negative.   Current Outpatient Medications:    acetaminophen (TYLENOL) 500 MG tablet, Take 1,000 mg by mouth every 6 (six) hours as needed for moderate pain or headache.,  Disp: , Rfl:    aspirin EC 325 MG tablet, Take 325 mg by mouth daily., Disp: , Rfl:    Blood Glucose Monitoring Suppl (ONE TOUCH ULTRA 2) w/Device KIT, 1 each by Does not apply route daily., Disp: 1 kit, Rfl: 0   busPIRone (BUSPAR) 5 MG tablet, Take 1 tablet (5 mg total) by mouth 2 (two) times daily as needed., Disp: 30 tablet, Rfl: 1   carvedilol (COREG) 25 MG tablet, Take 1 tablet (25 mg total) by  mouth 2 (two) times daily with a meal., Disp: 180 tablet, Rfl: 0   DULoxetine (CYMBALTA) 30 MG capsule, Take 1 capsule (30 mg total) by mouth daily., Disp: 30 capsule, Rfl: 1   famotidine (PEPCID) 20 MG tablet, TAKE 1 TABLET(20 MG) BY MOUTH TWICE DAILY, Disp: 180 tablet, Rfl: 0   glucose blood (ONETOUCH ULTRA) test strip, Use as instructed, Disp: 100 each, Rfl: 12   ONE TOUCH CLUB LANCETS MISC, 1 each by Does not apply route daily., Disp: 100 each, Rfl: 11   Semaglutide, 1 MG/DOSE, 4 MG/3ML SOPN, Inject 1 mg as directed once a week., Disp: 3 mL, Rfl: 0   Vitamin D, Ergocalciferol, (DRISDOL) 1.25 MG (50000 UNIT) CAPS capsule, Take 1 capsule (50,000 Units total) by mouth every 7 (seven) days., Disp: 4 capsule, Rfl: 1   Objective:     Vitals:   01/02/22 1534  BP: 132/80  Weight: (!) 308 lb (139.7 kg)  Height: 5' 6"  (1.676 m)      Body mass index is 49.71 kg/m.    Physical Exam:    Gen: Appears well, nad, nontoxic and pleasant Neuro:sensation intact, strength is 5/5 with df/pf/inv/ev, muscle tone wnl Skin: no suspicious lesion or defmority Psych: A&O, appropriate mood and affect  Left shoulder: no deformity, swelling or muscle wasting No scapular winging FF 160, abd 160, int 10, ext 80 NTTP over the [place], clavicle, ac, coracoid, biceps groove, humerus, deltoid, trapezius, cervical spine Positive speeds, crossarm, Hawkins, Neer Neg empty can, subscap liftoff, obriens Neg ant drawer, sulcus sign, apprehension Negative Spurling's test bilat FROM of neck    Electronically signed by:  Benito Mccreedy D.[facility] 3:57 PM 01/02/22

## 2022-01-02 ENCOUNTER — Ambulatory Visit (INDEPENDENT_AMBULATORY_CARE_PROVIDER_SITE_OTHER): Payer: Managed Care, Other (non HMO) | Admitting: Sports Medicine

## 2022-01-02 VITALS — BP 132/80 | Ht 66.0 in | Wt 308.0 lb

## 2022-01-02 DIAGNOSIS — M25512 Pain in left shoulder: Secondary | ICD-10-CM

## 2022-01-02 DIAGNOSIS — M25511 Pain in right shoulder: Secondary | ICD-10-CM | POA: Diagnosis not present

## 2022-01-02 DIAGNOSIS — G8929 Other chronic pain: Secondary | ICD-10-CM

## 2022-01-02 DIAGNOSIS — M7582 Other shoulder lesions, left shoulder: Secondary | ICD-10-CM | POA: Diagnosis not present

## 2022-01-02 NOTE — Patient Instructions (Addendum)
Good to see you Shoulder HEP 3 week follow up  

## 2022-01-09 ENCOUNTER — Ambulatory Visit: Payer: Managed Care, Other (non HMO)

## 2022-01-16 LAB — HM DIABETES EYE EXAM

## 2022-01-22 NOTE — Progress Notes (Signed)
Lynn [name] D.Lynn [name] Phone: [phone]   Assessment and Plan:     1. Chronic pain of both shoulders 2. 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  Not consistent with frozen shoulder as patient's A1c is well controlled and in prediabetic range, and patient has mostly retained ROM and is still having pain with no period of "freezing" - Patient elected for subacromial CSI.  Tolerated well per note below.  Corticosteroid may  may temporarily increase blood glucose with past medical history of DM type II - Start HEP - Elected to not use NSAID course as patient states that she has allergy to ibuprofen - Patient's failed therapies in the past include temporary relief with prednisone p.o. that returned after course completion, limited relief with muscle relaxers, limited relief with short-term PT -Left shoulder had significant improvement after subacromial CSI at previous office visit on 01/02/2022.  Procedure: Subacromial Injection Side: Right   Risks explained and consent was given verbally. The site was cleaned with alcohol prep. A steroid injection was performed from posterior approach using 15m of 1% lidocaine without epinephrine and 171mof kenalog 4042ml. This was well tolerated and resulted in symptomatic relief.  Needle was removed, hemostasis achieved, and post injection instructions were explained.   Pt was advised to call or return to clinic if these symptoms worsen or fail to improve as anticipated.     Pertinent previous records reviewed include none   Follow Up: 3 weeks for reevaluation.  Could consider ultrasound versus advanced imaging based on patient's symptoms       Subjective:   I, Lynn [name], am serving as a  scrEducation administratorr Doctor BenGlennon MacChief Complaint: bilateral shoulder pain    HPI:    01/02/2022 Patient is a [age]ar old female complaining of bilateral shoulder pain. Patient states She states that there was no originating injury, been going on for a year now .  The pain is worse in her left shoulder, however it sometimes hurts on her right shoulder as well.  She was given a prednisone taper which helped while she was taking it. She states that sometimes her left arm will go numb. She has not gone to PT yet. When it gets really bad she is really TTP  , muscle relaxors are ineffective   shoulder pain - went to PT for a few weeks - then got Covid and didn't return to PT - now she is having pain on the right shoulder - is also having nerve pain similar to carpel tunnel - down the arm on the right and left side - severe enough that she has to take her ring off - sometimes when driving her arm goes to sleep - will also get pins and needles symptoms in head - went to the ER march for cp rule out - entire arm from elbow down will be numb    01/23/2022 Patient states that her left shoulder feels good but now right shoulder is really bad    Relevant Historical Information: DM type II  Additional pertinent review of systems negative.   Current Outpatient Medications:    acetaminophen (TYLENOL) 500 MG tablet, Take 1,000 mg by mouth every 6 (six) hours as needed for moderate pain or headache., Disp: , Rfl:  aspirin EC 325 MG tablet, Take 325 mg by mouth daily., Disp: , Rfl:    Blood Glucose Monitoring Suppl (ONE TOUCH ULTRA 2) w/Device KIT, 1 each by Does not apply route daily., Disp: 1 kit, Rfl: 0   busPIRone (BUSPAR) 5 MG tablet, Take 1 tablet (5 mg total) by mouth 2 (two) times daily as needed., Disp: 30 tablet, Rfl: 1   carvedilol (COREG) 25 MG tablet, Take 1 tablet (25 mg total) by mouth 2 (two) times daily with a meal., Disp: 180 tablet, Rfl: 0   DULoxetine (CYMBALTA) 30 MG capsule,  Take 1 capsule (30 mg total) by mouth daily., Disp: 30 capsule, Rfl: 1   famotidine (PEPCID) 20 MG tablet, TAKE 1 TABLET(20 MG) BY MOUTH TWICE DAILY, Disp: 180 tablet, Rfl: 0   glucose blood (ONETOUCH ULTRA) test strip, Use as instructed, Disp: 100 each, Rfl: 12   ONE TOUCH CLUB LANCETS MISC, 1 each by Does not apply route daily., Disp: 100 each, Rfl: 11   Semaglutide, 1 MG/DOSE, 4 MG/3ML SOPN, Inject 1 mg as directed once a week., Disp: 3 mL, Rfl: 0   Vitamin D, Ergocalciferol, (DRISDOL) 1.25 MG (50000 UNIT) CAPS capsule, Take 1 capsule (50,000 Units total) by mouth every 7 (seven) days., Disp: 4 capsule, Rfl: 1   Objective:     Vitals:   01/23/22 0810  BP: 118/80  Pulse: (!) 111  SpO2: 98%  Weight: (!) 308 lb (139.7 kg)  Height: 5' 6" (1.676 m)      Body mass index is 49.71 kg/m.    Physical Exam:    Gen: Appears well, nad, nontoxic and pleasant Neuro:sensation intact, strength is 5/5 with df/pf/inv/ev, muscle tone wnl Skin: no suspicious lesion or defmority Psych: A&O, appropriate mood and affect  Right shoulder: no deformity, swelling or muscle wasting No scapular winging FF 170, abd 170, int 10, ext 90 NTTP over the Leupp, clavicle, ac, coracoid, biceps groove, humerus, deltoid, trapezius, cervical spine Positive empty can, speeds, Hawking's, Neer Neg  subscap liftoff, speeds,  , crossarm Neg ant drawer, sulcus sign, apprehension Negative Spurling's test bilat FROM of neck    Electronically signed by:  Ben Jackson D.O. CAQSM [facility] 8:33 AM 01/23/22 

## 2022-01-23 ENCOUNTER — Ambulatory Visit (INDEPENDENT_AMBULATORY_CARE_PROVIDER_SITE_OTHER): Payer: Managed Care, Other (non HMO) | Admitting: Sports Medicine

## 2022-01-23 VITALS — BP 118/80 | HR 111 | Ht 66.0 in | Wt 308.0 lb

## 2022-01-23 DIAGNOSIS — M25511 Pain in right shoulder: Secondary | ICD-10-CM

## 2022-01-23 DIAGNOSIS — M25512 Pain in left shoulder: Secondary | ICD-10-CM | POA: Diagnosis not present

## 2022-01-23 DIAGNOSIS — G8929 Other chronic pain: Secondary | ICD-10-CM | POA: Diagnosis not present

## 2022-01-23 DIAGNOSIS — M7581 Other shoulder lesions, right shoulder: Secondary | ICD-10-CM | POA: Diagnosis not present

## 2022-01-23 NOTE — Patient Instructions (Addendum)
Good to see you  Continue PT and HEP  3-4 week follow up

## 2022-02-02 ENCOUNTER — Telehealth: Payer: Self-pay | Admitting: Nurse Practitioner

## 2022-02-02 ENCOUNTER — Ambulatory Visit: Payer: Managed Care, Other (non HMO) | Admitting: Nurse Practitioner

## 2022-02-02 NOTE — Telephone Encounter (Signed)
Pt was a no show 11/03 for an OV with [facility]. This is her first with [place], letter has been sent

## 2022-02-02 NOTE — Telephone Encounter (Signed)
Noted  

## 2022-02-02 NOTE — Telephone Encounter (Signed)
1st no show, fee waived, letter sent 

## 2022-02-12 NOTE — Progress Notes (Unsigned)
Lynn [name] [name] Phone: [phone]   Assessment and Plan:     There are no diagnoses linked to this encounter.  ***   Pertinent previous records reviewed include ***   Follow Up: ***     Subjective:   I, Lynn [name], am serving as a Education administrator for Doctor Glennon Mac   Chief Complaint: bilateral shoulder pain    HPI:    01/02/2022 Patient is a 36 year old female complaining of bilateral shoulder pain. Patient states She states that there was no originating injury, been going on for a year now .  The pain is worse in her left shoulder, however it sometimes hurts on her right shoulder as well.  She was given a prednisone taper which helped while she was taking it. She states that sometimes her left arm will go numb. She has not gone to PT yet. When it gets really bad she is really TTP  , muscle relaxors are ineffective   shoulder pain - went to PT for a few weeks - then got Covid and didn't return to PT - now she is having pain on the right shoulder - is also having nerve pain similar to carpel tunnel - down the arm on the right and left side - severe enough that she has to take her ring off - sometimes when driving her arm goes to sleep - will also get pins and needles symptoms in head - went to the ER march for cp rule out - entire arm from elbow down will be numb    01/23/2022 Patient states that her left shoulder feels good but now right shoulder is really bad   02/13/2022 Patient states   Relevant Historical Information: DM type II  Additional pertinent review of systems negative.   Current Outpatient Medications:    acetaminophen (TYLENOL) 500 MG tablet, Take 1,000 mg by mouth every 6 (six) hours as needed for moderate pain or headache., Disp: , Rfl:    aspirin EC 325 MG tablet, Take 325 mg by mouth daily., Disp: , Rfl:    Blood Glucose Monitoring Suppl (ONE TOUCH ULTRA 2)  w/Device KIT, 1 each by Does not apply route daily., Disp: 1 kit, Rfl: 0   busPIRone (BUSPAR) 5 MG tablet, Take 1 tablet (5 mg total) by mouth 2 (two) times daily as needed., Disp: 30 tablet, Rfl: 1   carvedilol (COREG) 25 MG tablet, Take 1 tablet (25 mg total) by mouth 2 (two) times daily with a meal., Disp: 180 tablet, Rfl: 0   DULoxetine (CYMBALTA) 30 MG capsule, Take 1 capsule (30 mg total) by mouth daily., Disp: 30 capsule, Rfl: 1   famotidine (PEPCID) 20 MG tablet, TAKE 1 TABLET(20 MG) BY MOUTH TWICE DAILY, Disp: 180 tablet, Rfl: 0   glucose blood (ONETOUCH ULTRA) test strip, Use as instructed, Disp: 100 each, Rfl: 12   ONE TOUCH CLUB LANCETS MISC, 1 each by Does not apply route daily., Disp: 100 each, Rfl: 11   Semaglutide, 1 MG/DOSE, 4 MG/3ML SOPN, Inject 1 mg as directed once a week., Disp: 3 mL, Rfl: 0   Vitamin D, Ergocalciferol, (DRISDOL) 1.25 MG (50000 UNIT) CAPS capsule, Take 1 capsule (50,000 Units total) by mouth every 7 (seven) days., Disp: 4 capsule, Rfl: 1   Objective:     There were no vitals filed for this visit.    There is no height or weight on file  to calculate BMI.    Physical Exam:    ***   Electronically signed by:  Lynn [name] D.[name] 4:32 PM 02/12/22

## 2022-02-13 ENCOUNTER — Ambulatory Visit (INDEPENDENT_AMBULATORY_CARE_PROVIDER_SITE_OTHER): Payer: Managed Care, Other (non HMO) | Admitting: Sports Medicine

## 2022-02-13 VITALS — BP 122/80 | HR 97 | Ht 66.0 in | Wt 303.0 lb

## 2022-02-13 DIAGNOSIS — M25511 Pain in right shoulder: Secondary | ICD-10-CM | POA: Diagnosis not present

## 2022-02-13 DIAGNOSIS — M7581 Other shoulder lesions, right shoulder: Secondary | ICD-10-CM

## 2022-02-13 DIAGNOSIS — G8929 Other chronic pain: Secondary | ICD-10-CM | POA: Diagnosis not present

## 2022-02-13 DIAGNOSIS — M7582 Other shoulder lesions, left shoulder: Secondary | ICD-10-CM | POA: Diagnosis not present

## 2022-02-13 DIAGNOSIS — M25512 Pain in left shoulder: Secondary | ICD-10-CM

## 2022-02-13 NOTE — Patient Instructions (Addendum)
Good to see you  MRI referral  Follow up 3 days after your MRI to discuss your results

## 2022-02-14 ENCOUNTER — Emergency Department (HOSPITAL_COMMUNITY): Payer: Managed Care, Other (non HMO)

## 2022-02-14 ENCOUNTER — Emergency Department (HOSPITAL_COMMUNITY)
Admission: EM | Admit: 2022-02-14 | Discharge: 2022-02-14 | Discharge status: Against Medical Advice | Payer: Managed Care, Other (non HMO) | Attending: Student | Admitting: Student

## 2022-02-14 ENCOUNTER — Other Ambulatory Visit: Payer: Self-pay

## 2022-02-14 ENCOUNTER — Ambulatory Visit: Payer: Managed Care, Other (non HMO)

## 2022-02-14 ENCOUNTER — Ambulatory Visit
Admission: RE | Admit: 2022-02-14 | Discharge: 2022-02-14 | Disposition: A | Discharge status: Home / Self Care | Payer: Managed Care, Other (non HMO) | Source: Ambulatory Visit | Attending: Internal Medicine | Admitting: Internal Medicine

## 2022-02-14 VITALS — BP 138/84 | HR 96 | Temp 99.0°F | Resp 16

## 2022-02-14 DIAGNOSIS — M549 Dorsalgia, unspecified: Secondary | ICD-10-CM

## 2022-02-14 DIAGNOSIS — Z5321 Procedure and treatment not carried out due to patient leaving prior to being seen by health care provider: Secondary | ICD-10-CM | POA: Diagnosis not present

## 2022-02-14 DIAGNOSIS — M545 Low back pain, unspecified: Secondary | ICD-10-CM | POA: Diagnosis not present

## 2022-02-14 DIAGNOSIS — M542 Cervicalgia: Secondary | ICD-10-CM | POA: Diagnosis not present

## 2022-02-14 DIAGNOSIS — R0781 Pleurodynia: Secondary | ICD-10-CM | POA: Insufficient documentation

## 2022-02-14 DIAGNOSIS — R55 Syncope and collapse: Secondary | ICD-10-CM | POA: Diagnosis not present

## 2022-02-14 DIAGNOSIS — R1011 Right upper quadrant pain: Secondary | ICD-10-CM | POA: Diagnosis not present

## 2022-02-14 DIAGNOSIS — Z3202 Encounter for pregnancy test, result negative: Secondary | ICD-10-CM

## 2022-02-14 DIAGNOSIS — M79652 Pain in left thigh: Secondary | ICD-10-CM

## 2022-02-14 DIAGNOSIS — R0789 Other chest pain: Secondary | ICD-10-CM

## 2022-02-14 DIAGNOSIS — E119 Type 2 diabetes mellitus without complications: Secondary | ICD-10-CM

## 2022-02-14 LAB — COMPREHENSIVE METABOLIC PANEL
ALT: 22 U/L (ref 0–44)
AST: 20 U/L (ref 15–41)
Albumin: 3.3 g/dL — ABNORMAL LOW (ref 3.5–5.0)
Alkaline Phosphatase: 46 U/L (ref 38–126)
Anion gap: 9 (ref 5–15)
BUN: 12 mg/dL (ref 6–20)
CO2: 23 mmol/L (ref 22–32)
Calcium: 8.7 mg/dL — ABNORMAL LOW (ref 8.9–10.3)
Chloride: 104 mmol/L (ref 98–111)
Creatinine, Ser: 0.83 mg/dL (ref 0.44–1.00)
GFR, Estimated: >60 mL/min (ref >60)
Glucose, Bld: 108 mg/dL — ABNORMAL HIGH (ref 70–99)
Potassium: 4.1 mmol/L (ref 3.5–5.1)
Sodium: 136 mmol/L (ref 135–145)
Total Bilirubin: 0.4 mg/dL (ref 0.3–1.2)
Total Protein: 6.8 g/dL (ref 6.5–8.1)

## 2022-02-14 LAB — POCT URINALYSIS DIP (MANUAL ENTRY)
Bilirubin, UA: NEGATIVE
Glucose, UA: NEGATIVE mg/dL
Ketones, POC UA: NEGATIVE mg/dL
Nitrite, UA: NEGATIVE
Protein Ur, POC: NEGATIVE mg/dL
Spec Grav, UA: 1.025 (ref 1.010–1.025)
Urobilinogen, UA: 0.2 E.U./dL
pH, UA: 6 (ref 5.0–8.0)

## 2022-02-14 LAB — CBC WITH DIFFERENTIAL/PLATELET
Abs Immature Granulocytes: 0.03 10*3/uL (ref 0.00–0.07)
Basophils Absolute: 0 10*3/uL (ref 0.0–0.1)
Basophils Relative: 1 %
Eosinophils Absolute: 0.1 10*3/uL (ref 0.0–0.5)
Eosinophils Relative: 1 %
HCT: 35.7 % — ABNORMAL LOW (ref 36.0–46.0)
Hemoglobin: 12 g/dL (ref 12.0–15.0)
Immature Granulocytes: 0 %
Lymphocytes Relative: 24 %
Lymphs Abs: 1.9 10*3/uL (ref 0.7–4.0)
MCH: 26.9 pg (ref 26.0–34.0)
MCHC: 33.6 g/dL (ref 30.0–36.0)
MCV: 80 fL (ref 80.0–100.0)
Monocytes Absolute: 0.6 10*3/uL (ref 0.1–1.0)
Monocytes Relative: 8 %
Neutro Abs: 5.4 10*3/uL (ref 1.7–7.7)
Neutrophils Relative %: 66 %
Platelets: 239 10*3/uL (ref 150–400)
RBC: 4.46 MIL/uL (ref 3.87–5.11)
RDW: 14 % (ref 11.5–15.5)
WBC: 8 10*3/uL (ref 4.0–10.5)
nRBC: 0 % (ref 0.0–0.2)

## 2022-02-14 LAB — I-STAT BETA HCG BLOOD, ED (MC, WL, AP ONLY): I-stat hCG, quantitative: <5 m[IU]/mL (ref <5)

## 2022-02-14 LAB — LIPASE, BLOOD: Lipase: 35 U/L (ref 11–51)

## 2022-02-14 LAB — TROPONIN I (HIGH SENSITIVITY): Troponin I (High Sensitivity): 3 ng/L (ref <18)

## 2022-02-14 LAB — POCT URINE PREGNANCY: Preg Test, Ur: NEGATIVE

## 2022-02-14 NOTE — ED Notes (Signed)
Pt can no longer be found in the lobby and is not responding when sort is calling for vs

## 2022-02-14 NOTE — ED Provider Notes (Signed)
[name]  Note:  This document was prepared using Systems analyst and may include unintentional dictation errors.  MRN: [id number] DOB: Aug 14, 1985  Subjective:   Lynn [name] is a 36 y.o. female presenting for multiple chief complaints.  Patient primarily reports right-sided upper back pain worse along the trapezius and goes down to the thoracic right side of her back, has concurrent right-sided chest pain worse with taking a deep breath.  Also has left medial thigh pain.  Reports that 2 to 3 days ago when she was getting out of the bathroom and walking toward her bedroom, she suffered a syncope and collapse onto the carpet.  Cannot recall how long she was out.  She contacted her primary care provider who advised she go to the emergency room.  Patient did as instructed and had numerous tests completed prior to being evaluated.  Unfortunately patient left without being seen due to the wait and decided to come to our clinic.  No personal history of arrhythmias but family history does have cardiac risk factors including heart failure, heart disease, MI.  There is also history of kidney disease and stroke/TIA with her family but not personal.  The only other possible factor that patient can think of is that she works third shift.  No current facility-administered medications for this encounter.  Current Outpatient Medications:    acetaminophen (TYLENOL) 500 MG tablet, Take 1,000 mg by mouth every 6 (six) hours as needed for moderate pain or headache., Disp: , Rfl:    aspirin EC 325 MG tablet, Take 325 mg by mouth daily., Disp: , Rfl:    Blood Glucose Monitoring Suppl (ONE TOUCH ULTRA 2) w/Device KIT, 1 each by Does not apply route daily., Disp: 1 kit, Rfl: 0   busPIRone (BUSPAR) 5 MG tablet, Take 1 tablet (5 mg total) by mouth 2 (two) times daily as needed., Disp: 30 tablet, Rfl: 1   carvedilol (COREG) 25 MG tablet, Take 1 tablet (25 mg total) by mouth 2  (two) times daily with a meal., Disp: 180 tablet, Rfl: 0   DULoxetine (CYMBALTA) 30 MG capsule, Take 1 capsule (30 mg total) by mouth daily., Disp: 30 capsule, Rfl: 1   famotidine (PEPCID) 20 MG tablet, TAKE 1 TABLET(20 MG) BY MOUTH TWICE DAILY, Disp: 180 tablet, Rfl: 0   glucose blood (ONETOUCH ULTRA) test strip, Use as instructed, Disp: 100 each, Rfl: 12   ONE TOUCH CLUB LANCETS MISC, 1 each by Does not apply route daily., Disp: 100 each, Rfl: 11   Semaglutide, 1 MG/DOSE, 4 MG/3ML SOPN, Inject 1 mg as directed once a week., Disp: 3 mL, Rfl: 0   Vitamin D, Ergocalciferol, (DRISDOL) 1.25 MG (50000 UNIT) CAPS capsule, Take 1 capsule (50,000 Units total) by mouth every 7 (seven) days., Disp: 4 capsule, Rfl: 1   Allergies  Allergen Reactions   Macrobid [Nitrofurantoin Macrocrystal] Shortness Of Breath and Nausea And Vomiting   Ibuprofen Hives    Past Medical History:  Diagnosis Date   Carpal tunnel syndrome, left    Diabetes (HCC)    Hypertension    Iron deficiency    Panic attacks    PCOS (polycystic ovarian syndrome)    Tachycardia      Past Surgical History:  Procedure Laterality Date   ADENOIDECTOMY     CESAREAN SECTION     TONSILLECTOMY      Family History  Problem Relation Age of Onset   Heart disease Mother  Diabetes Mother    Hypertension Mother    Kidney disease Mother    Heart failure Mother    Heart attack Mother 57   Arthritis Mother    Asthma Mother    COPD Mother    Depression Mother    Hyperlipidemia Mother    Mental illness Mother    Heart disease Father    Heart attack Father 49   COPD Father    Hyperlipidemia Father    Hypertension Father    Mental illness Father    Arthritis Sister    Depression Sister    Diabetes Sister    Hypertension Sister    Mental illness Sister    Miscarriages / Stillbirths Sister    Heart failure Maternal Grandmother    Arthritis Maternal Grandmother    Alcohol abuse Maternal Grandmother    COPD Maternal  Grandmother    Diabetes Maternal Grandmother    Hearing loss Maternal Grandmother    Heart disease Maternal Grandmother    Hyperlipidemia Maternal Grandmother    Hypertension Maternal Grandmother    Stroke Maternal Grandmother    Miscarriages / Stillbirths Maternal Grandmother    Mental illness Maternal Grandmother    Heart attack Maternal Grandmother    Heart failure Maternal Grandfather    Alcohol abuse Maternal Grandfather    Arthritis Maternal Grandfather    Early death Maternal Grandfather    Heart disease Maternal Grandfather    Hyperlipidemia Maternal Grandfather    Hypertension Maternal Grandfather    Heart attack Maternal Grandfather 60   Mental illness Maternal Grandfather    Heart failure Paternal Grandmother    Arthritis Paternal Grandmother    Diabetes Paternal Grandmother    Heart attack Paternal 47 / Stillbirths Paternal Grandmother    Mental illness Paternal Grandmother    Kidney disease Paternal Grandmother    Intellectual disability Paternal Grandmother    Hypertension Paternal Grandmother    Hyperlipidemia Paternal Grandmother    Heart disease Paternal Grandmother    Heart failure Paternal Grandfather    Arthritis Paternal Grandfather    Heart attack Paternal Grandfather    Mental illness Paternal Grandfather    Hearing loss Paternal Grandfather    Heart disease Paternal Grandfather    Hyperlipidemia Paternal Grandfather    Hypertension Paternal Grandfather    Alcohol abuse Sister    Depression Sister    Diabetes Sister    Heart disease Sister    Heart attack Sister 29   Arthritis Sister    Kidney disease Sister    Transient ischemic attack Sister     Social History   Tobacco Use   Smoking status: Former    Packs/day: 1.00    Years: 7.00    Total pack years: 7.00    Types: Cigarettes, E-cigarettes    Quit date: 10/29/2021    Years since quitting: 0.2   Smokeless tobacco: Never  Vaping Use   Vaping Use: Former   Substance Use Topics   Alcohol use: Yes    Comment: rarely   Drug use: No    ROS   Objective:   Vitals: BP 138/84 (BP Location: Right Arm)   Pulse 96   Temp 99 F (37.2 C) (Oral)   Resp 16   LMP 01/28/2022 (Approximate) Comment: hx PCOS  SpO2 97%   Physical Exam Constitutional:      General: She is not in acute distress.    Appearance: Normal appearance. She is well-developed. She is not ill-appearing, toxic-appearing  or diaphoretic.  HENT:     Head: Normocephalic and atraumatic.     Right Ear: External ear normal.     Left Ear: External ear normal.     Nose: Nose normal.     Mouth/Throat:     Mouth: Mucous membranes are moist.  Eyes:     General: No scleral icterus.       Right eye: No discharge.        Left eye: No discharge.     Extraocular Movements: Extraocular movements intact.  Cardiovascular:     Rate and Rhythm: Normal rate and regular rhythm.     Heart sounds: Normal heart sounds. No murmur heard.    No friction rub. No gallop.  Pulmonary:     Effort: Pulmonary effort is normal. No respiratory distress.     Breath sounds: No stridor. No wheezing, rhonchi or rales.  Chest:     Chest wall: No tenderness.  Musculoskeletal:       Arms:  Skin:    General: Skin is warm and dry.     Findings: No rash.  Neurological:     General: No focal deficit present.     Mental Status: She is alert and oriented to person, place, and time.     Cranial Nerves: No cranial nerve deficit.     Motor: No weakness.     Coordination: Coordination normal.     Gait: Gait normal.     Deep Tendon Reflexes: Reflexes normal.  Psychiatric:        Mood and Affect: Mood normal.        Behavior: Behavior normal.        Thought Content: Thought content normal.        Judgment: Judgment normal.     DG Lumbar Spine 2-3 Views  Result Date: 02/14/2022 CLINICAL DATA:  Acute lower back pain without known injury. EXAM: LUMBAR SPINE - 2-3 VIEW COMPARISON:  None Available. FINDINGS:  There is no evidence of lumbar spine fracture. Alignment is normal. Intervertebral disc spaces are maintained. IMPRESSION: Negative. Electronically Signed   By: Marijo Conception M.D.   On: 02/14/2022 17:47   DG Cervical Spine Complete  Result Date: 02/14/2022 CLINICAL DATA:  Acute neck pain without known injury. EXAM: CERVICAL SPINE - COMPLETE 4+ VIEW COMPARISON:  None Available. FINDINGS: There is no evidence of cervical spine fracture or prevertebral soft tissue swelling. Alignment is normal. No other significant bone abnormalities are identified. IMPRESSION: Negative cervical spine radiographs. Electronically Signed   By: Marijo Conception M.D.   On: 02/14/2022 17:46   US Abdomen Limited RUQ (LIVER/GB)  Result Date: 02/14/2022 CLINICAL DATA:  Abdominal pain EXAM: ULTRASOUND ABDOMEN LIMITED RIGHT UPPER QUADRANT COMPARISON:  None Available. FINDINGS: Gallbladder: No gallstones or wall thickening visualized. No sonographic Murphy sign noted by sonographer. Common bile duct: Diameter: 5 mm Liver: No focal lesion identified. Within normal limits in parenchymal echogenicity. Portal vein is patent on color Doppler imaging with normal direction of blood flow towards the liver. Other: None. IMPRESSION: No evidence of cholelithiasis or sonographic findings suggest cholecystitis. Electronically Signed   By: Marin Roberts M.D.   On: 02/14/2022 11:17   DG Chest 2 View  Result Date: 02/14/2022 CLINICAL DATA:  Chest pain EXAM: CHEST - 2 VIEW COMPARISON:  Radiograph 06/15/2021 FINDINGS: Unchanged cardiomediastinal silhouette. There is no focal airspace consolidation. There is no pleural effusion or pneumothorax. No acute osseous abnormality. IMPRESSION: No evidence of acute cardiopulmonary disease. Electronically Signed  By: Maurine Simmering M.D.   On: 02/14/2022 10:49     Results for orders placed or performed during the hospital encounter of 02/14/22 (from the past 24 hour(s))  POCT urinalysis dipstick     Status:  Abnormal   Collection Time: 02/14/22  4:49 PM  Result Value Ref Range   Color, UA yellow yellow   Clarity, UA clear clear   Glucose, UA negative negative mg/dL   Bilirubin, UA negative negative   Ketones, POC UA negative negative mg/dL   Spec Grav, UA 1.025 1.010 - 1.025   Blood, UA trace-intact (A) negative   pH, UA 6.0 5.0 - 8.0   Protein Ur, POC negative negative mg/dL   Urobilinogen, UA 0.2 0.2 or 1.0 E.U./dL   Nitrite, UA Negative Negative   Leukocytes, UA Trace (A) Negative  POCT urine pregnancy     Status: None   Collection Time: 02/14/22  4:49 PM  Result Value Ref Range   Preg Test, Ur Negative Negative   Recent Results (from the past 2160 hour(s))  Comprehensive metabolic panel     Status: Abnormal   Collection Time: 12/07/21  1:03 PM  Result Value Ref Range   Sodium 135 135 - 145 mEq/L   Potassium 4.0 3.5 - 5.1 mEq/L   Chloride 98 96 - 112 mEq/L   CO2 29 19 - 32 mEq/L   Glucose, Bld 114 (H) 70 - 99 mg/dL   BUN 14 6 - 23 mg/dL   Creatinine, Ser 0.77 0.40 - 1.20 mg/dL   Total Bilirubin 0.4 0.2 - 1.2 mg/dL   Alkaline Phosphatase 60 39 - 117 U/L   AST 18 0 - 37 U/L   ALT 19 0 - 35 U/L   Total Protein 7.2 6.0 - 8.3 g/dL   Albumin 3.8 3.5 - 5.2 g/dL   GFR 100.68 >60.00 mL/min    Comment: Calculated using the CKD-EPI Creatinine Equation (2021)   Calcium 9.0 8.4 - 10.5 mg/dL  CBC     Status: None   Collection Time: 12/07/21  1:03 PM  Result Value Ref Range   WBC 7.5 4.0 - 10.5 K/uL   RBC 4.66 3.87 - 5.11 Mil/uL   Platelets 266.0 150.0 - 400.0 K/uL   Hemoglobin 12.0 12.0 - 15.0 g/dL   HCT 36.6 36.0 - 46.0 %   MCV 78.4 78.0 - 100.0 fl   MCHC 32.9 30.0 - 36.0 g/dL   RDW 14.7 11.5 - 15.5 %  Ferritin     Status: Abnormal   Collection Time: 12/07/21  1:03 PM  Result Value Ref Range   Ferritin 9.2 (L) 10.0 - 291.0 ng/mL  Vitamin D, 25-hydroxy     Status: Abnormal   Collection Time: 12/07/21  1:03 PM  Result Value Ref Range   VITD 16.92 (L) 30.00 - 100.00 ng/mL   Vitamin B12     Status: None   Collection Time: 12/07/21  1:03 PM  Result Value Ref Range   Vitamin B-12 386 211 - 911 pg/mL  Hemoglobin A1c     Status: None   Collection Time: 12/07/21  1:03 PM  Result Value Ref Range   Hgb A1c MFr Bld 6.4 4.6 - 6.5 %    Comment: Glycemic Control Guidelines for People with Diabetes:Non Diabetic:  <6%Goal of Therapy: <7%Additional Action Suggested:  >8%   Lipid panel     Status: Abnormal   Collection Time: 12/07/21  1:03 PM  Result Value Ref Range   Cholesterol 160 0 -  200 mg/dL    Comment: ATP III Classification       Desirable:  < 200 mg/dL               Borderline High:  200 - 239 mg/dL          High:  > = 240 mg/dL   Triglycerides 154.0 (H) 0.0 - 149.0 mg/dL    Comment: Normal:  <150 mg/dLBorderline High:  150 - 199 mg/dL   HDL 47.40 >39.00 mg/dL   VLDL 30.8 0.0 - 40.0 mg/dL   LDL Cholesterol 82 0 - 99 mg/dL   Total CHOL/HDL Ratio 3     Comment:                Men          Women1/2 Average Risk     3.4          3.3Average Risk          5.0          4.42X Average Risk          9.6          7.13X Average Risk          15.0          11.0                       NonHDL 113.09     Comment: NOTE:  Non-HDL goal should be 30 mg/dL higher than patient's LDL goal (i.e. LDL goal of < 70 mg/dL, would have non-HDL goal of < 100 mg/dL)  POC COVID-19 BinaxNow     Status: Normal   Collection Time: 12/22/21  3:12 PM  Result Value Ref Range   SARS Coronavirus 2 Ag Negative Negative  POCT rapid strep A     Status: Normal   Collection Time: 12/22/21  3:12 PM  Result Value Ref Range   Rapid Strep A Screen Negative Negative  Microalbumin / creatinine urine ratio     Status: None   Collection Time: 12/22/21  3:34 PM  Result Value Ref Range   Creatinine, Urine 193 20 - 275 mg/dL   Microalb, Ur 1.3 mg/dL    Comment: Reference Range Not established    Microalb Creat Ratio 7 <30 mcg/mg creat    Comment: . The ADA defines abnormalities in albumin excretion as  follows: Marland Kitchen Albuminuria Category        Result (mcg/mg creatinine) . Normal to Mildly increased   <30 Moderately increased         30-299  Severely increased           > OR = 300 . The ADA recommends that at least two of three specimens collected within a 3-6 month period be abnormal before considering a patient to be within a diagnostic category.   CBC with Differential     Status: Abnormal   Collection Time: 02/14/22 10:27 AM  Result Value Ref Range   WBC 8.0 4.0 - 10.5 K/uL   RBC 4.46 3.87 - 5.11 MIL/uL   Hemoglobin 12.0 12.0 - 15.0 g/dL   HCT 35.7 (L) 36.0 - 46.0 %   MCV 80.0 80.0 - 100.0 fL   MCH 26.9 26.0 - 34.0 pg   MCHC 33.6 30.0 - 36.0 g/dL   RDW 14.0 11.5 - 15.5 %   Platelets 239 150 - 400 K/uL   nRBC 0.0 0.0 - 0.2 %   Neutrophils Relative %  66 %   Neutro Abs 5.4 1.7 - 7.7 K/uL   Lymphocytes Relative 24 %   Lymphs Abs 1.9 0.7 - 4.0 K/uL   Monocytes Relative 8 %   Monocytes Absolute 0.6 0.1 - 1.0 K/uL   Eosinophils Relative 1 %   Eosinophils Absolute 0.1 0.0 - 0.5 K/uL   Basophils Relative 1 %   Basophils Absolute 0.0 0.0 - 0.1 K/uL   Immature Granulocytes 0 %   Abs Immature Granulocytes 0.03 0.00 - 0.07 K/uL    Comment: Performed at [facility] [address], [place], [place] [address]  Comprehensive metabolic panel     Status: Abnormal   Collection Time: 02/14/22 10:27 AM  Result Value Ref Range   Sodium 136 135 - 145 mmol/L   Potassium 4.1 3.5 - 5.1 mmol/L   Chloride 104 98 - 111 mmol/L   CO2 23 22 - 32 mmol/L   Glucose, Bld 108 (H) 70 - 99 mg/dL    Comment: Glucose reference range applies only to samples taken after fasting for at least 8 hours.   BUN 12 6 - 20 mg/dL   Creatinine, Ser 0.83 0.44 - 1.00 mg/dL   Calcium 8.7 (L) 8.9 - 10.3 mg/dL   Total Protein 6.8 6.5 - 8.1 g/dL   Albumin 3.3 (L) 3.5 - 5.0 g/dL   AST 20 15 - 41 U/L   ALT 22 0 - 44 U/L   Alkaline Phosphatase 46 38 - 126 U/L   Total Bilirubin 0.4 0.3 - 1.2 mg/dL   GFR,  Estimated >60 >60 mL/min    Comment: (NOTE) Calculated using the CKD-EPI Creatinine Equation (2021)    Anion gap 9 5 - 15    Comment: Performed at [facility], [place] [address], [place], [place] [address]  Lipase, blood     Status: None   Collection Time: 02/14/22 10:27 AM  Result Value Ref Range   Lipase 35 11 - 51 U/L    Comment: Performed at [facility] [address] [place], [place] [address]  Troponin I (High Sensitivity)     Status: None   Collection Time: 02/14/22 10:27 AM  Result Value Ref Range   Troponin I (High Sensitivity) 3 <18 ng/L    Comment: (NOTE) Elevated high sensitivity troponin I (hsTnI) values and significant  changes across serial measurements may suggest ACS but many other  chronic and acute conditions are known to elevate hsTnI results.  Refer to the "Links" section for chest pain algorithms and additional  guidance. Performed at [facility], [place] [address], [place],  [address]   I-Stat beta hCG blood, ED     Status: None   Collection Time: 02/14/22 10:48 AM  Result Value Ref Range   I-stat hCG, quantitative <5.0 <5 mIU/mL   Comment 3            Comment:   GEST. AGE      CONC.  (mIU/mL)   <=1 WEEK        5 - 50     2 WEEKS       50 - 500     3 WEEKS       100 - 10,000     4 WEEKS     1,000 - 30,000        FEMALE AND NON-PREGNANT FEMALE:     LESS THAN 5 mIU/mL   POCT urinalysis dipstick     Status: Abnormal  Collection Time: 02/14/22  4:49 PM  Result Value Ref Range   Color, UA yellow yellow   Clarity, UA clear clear   Glucose, UA negative negative mg/dL   Bilirubin, UA negative negative   Ketones, POC UA negative negative mg/dL   Spec Grav, UA 1.025 1.010 - 1.025   Blood, UA trace-intact (A) negative   pH, UA 6.0 5.0 - 8.0   Protein Ur, POC negative negative mg/dL   Urobilinogen, UA 0.2 0.2 or 1.0 E.U./dL   Nitrite, UA Negative Negative   Leukocytes, UA Trace (A) Negative  POCT urine pregnancy     Status: None    Collection Time: 02/14/22  4:49 PM  Result Value Ref Range   Preg Test, Ur Negative Negative    Assessment and Plan :   PDMP not reviewed this encounter.  1. Syncope and collapse   2. Upper back pain on right side   3. Atypical chest pain   4. Left thigh pain   5. Type 2 diabetes mellitus treated without insulin ([place])     Performed complete lab review from her visit earlier at the emergency room as she left without being seen.  This includes review of the EKG that was performed.  EKG was without any acute findings and was very comparable, unchanged to the EKG from May 2023.  Troponins were negative.  Imaging including x-rays and ultrasound were all negative.  Labs were largely unremarkable.  Repeat urinalysis also unremarkable.  Deferred repeat testing of the EKG, chest x-ray.  Recommended conservative management, offered medications but patient plans on following up with her PCP as soon as possible.  Counseled patient on potential for adverse effects with medications prescribed/recommended today, ER and return-to-clinic precautions discussed, patient verbalized understanding.    Jaynee Eagles, PA-C 02/14/22 1900

## 2022-02-14 NOTE — ED Triage Notes (Addendum)
Pt c/o pain to upper back, chest, worse with deep breaths started last night-left inner thigh pain x 2-3 days-pt states she passed out this am-NAD-steady gait-states she LWBS [place] today

## 2022-02-14 NOTE — ED Triage Notes (Signed)
Pt. Stated, I started having SOB, chest , back and leg pain last night . Today I passed out . This is something new.

## 2022-02-14 NOTE — ED Provider Triage Note (Addendum)
Emergency Medicine Provider Triage Evaluation Note  Lynn [name] , a 36 y.o. female  was evaluated in triage.  Pt complains of states has been having intermittent pleuritic chest pain starting yesterday and preceding syncopal event today. Pain also radiates to her right shoulder and is in the right upper quadrant.  Today while she was resting she had a syncopal event, did not hit her head unsure how long she lost consciousness for no history of seizures.  No previous abdominal surgeries.  Denies any recent travel, history of blood clots or usage of oral birth control. Ozempic dose increased recently.   Review of Systems  Per HPI  Physical Exam  There were no vitals taken for this visit. Gen:   Awake, no distress   Resp:  Normal effort  MSK:   Moves extremities without difficulty  Other:  +murphy.  Upper and lower pulses symmetric bilaterally.  No focal deficits neurologically.  Medical Decision Making  Medically screening exam initiated at 10:18 AM.  Appropriate orders placed.  Lynn [name] was informed that the remainder of the evaluation will be completed by another provider, this initial triage assessment does not replace that evaluation, and the importance of remaining in the ED until their evaluation is complete.     Sherrill Raring, PA-C 02/14/22 [place], Abiola Behring, PA-C 02/14/22 1021

## 2022-02-19 ENCOUNTER — Ambulatory Visit (INDEPENDENT_AMBULATORY_CARE_PROVIDER_SITE_OTHER): Payer: Managed Care, Other (non HMO) | Admitting: Sports Medicine

## 2022-02-19 ENCOUNTER — Ambulatory Visit (INDEPENDENT_AMBULATORY_CARE_PROVIDER_SITE_OTHER): Payer: Managed Care, Other (non HMO)

## 2022-02-19 DIAGNOSIS — G8929 Other chronic pain: Secondary | ICD-10-CM | POA: Diagnosis not present

## 2022-02-19 DIAGNOSIS — M25511 Pain in right shoulder: Secondary | ICD-10-CM | POA: Diagnosis not present

## 2022-02-19 DIAGNOSIS — M25512 Pain in left shoulder: Secondary | ICD-10-CM

## 2022-02-19 DIAGNOSIS — M7582 Other shoulder lesions, left shoulder: Secondary | ICD-10-CM

## 2022-02-19 MED ORDER — TRIAMCINOLONE ACETONIDE 40 MG/ML IJ SUSP
40.0000 mg | Freq: Once | INTRAMUSCULAR | Status: AC
  Administered 2022-02-19: 40 mg via INTRAMUSCULAR

## 2022-02-19 NOTE — Progress Notes (Signed)
    Procedures performed today:    Procedure: Real-time Ultrasound Guided gadolinium contrast injection of left glenohumeral joint Device: Samsung HS60  Verbal informed consent obtained.  Time-out conducted.  Noted no overlying erythema, induration, or other signs of local infection.  Skin prepped in a sterile fashion.  Local anesthesia: Topical Ethyl chloride.  With sterile technique and under real time ultrasound guidance: Noted normal-appearing joint, 1 cc kenalog 40, 2 cc lidocaine, 2 cc bupivacaine injected easily, syringe switched and 0.1 cc Gilliam injected, syringe again switched and 10 cc sterile saline used to fully distend the joint. joint visualized and capsule seen distending confirming intra-articular placement of contrast material and medication. Completed without difficulty  Advised to call if fevers/chills, erythema, induration, drainage, or persistent bleeding.  Images permanently stored in PACS Impression: Technically successful ultrasound guided gadolinium contrast injection for MR arthrography.  Please see separate MR arthrogram report.  Independent interpretation of notes and tests performed by another provider:   None.  Brief History, Exam, Impression, and Recommendations:    Acute pain of both shoulders Injection for MR arthrography, further management per primary treating provider.    ____________________________________________ Gwen Her. Dianah Field, M.D., ABFM., CAQSM., AME. Primary Care and Sports Medicine [facility]  Adjunct Professor of [place] [employer] of Medicine  Risk manager

## 2022-02-19 NOTE — Assessment & Plan Note (Signed)
Injection for MR arthrography, further management per primary treating provider. 

## 2022-02-19 NOTE — Addendum Note (Signed)
Addended by: Tarri Glenn A on: 02/19/2022 04:44 PM   Modules accepted: Orders

## 2022-02-20 NOTE — Progress Notes (Unsigned)
Lynn [name] D.[place] Phone: [phone]   Assessment and Plan:     There are no diagnoses linked to this encounter.  ***   Pertinent previous records reviewed include ***   Follow Up: ***     Subjective:   I, Lynn [name], am serving as a Education administrator for Lynn [name]   Chief Complaint: bilateral shoulder pain    HPI:    01/02/2022 Patient is a 36 year old female complaining of bilateral shoulder pain. Patient states She states that there was no originating injury, been going on for a year now .  The pain is worse in her left shoulder, however it sometimes hurts on her right shoulder as well.  She was given a prednisone taper which helped while she was taking it. She states that sometimes her left arm will go numb. She has not gone to PT yet. When it gets really bad she is really TTP  , muscle relaxors are ineffective   shoulder pain - went to PT for a few weeks - then got Covid and didn't return to PT - now she is having pain on the right shoulder - is also having nerve pain similar to carpel tunnel - down the arm on the right and left side - severe enough that she has to take her ring off - sometimes when driving her arm goes to sleep - will also get pins and needles symptoms in head - went to the ER march for cp rule out - entire arm from elbow down will be numb    01/23/2022 Patient states that her left shoulder feels good but now right shoulder is really bad    02/13/2022 Patient states her shoulders are back to were they started   02/21/2022 Patient states    Relevant Historical Information: DM type II  Additional pertinent review of systems negative.   Current Outpatient Medications:    acetaminophen (TYLENOL) 500 MG tablet, Take 1,000 mg by mouth every 6 (six) hours as needed for moderate pain or headache., Disp: , Rfl:    aspirin EC 325 MG tablet, Take 325 mg by  mouth daily., Disp: , Rfl:    Blood Glucose Monitoring Suppl (ONE TOUCH ULTRA 2) w/Device KIT, 1 each by Does not apply route daily., Disp: 1 kit, Rfl: 0   busPIRone (BUSPAR) 5 MG tablet, Take 1 tablet (5 mg total) by mouth 2 (two) times daily as needed., Disp: 30 tablet, Rfl: 1   carvedilol (COREG) 25 MG tablet, Take 1 tablet (25 mg total) by mouth 2 (two) times daily with a meal., Disp: 180 tablet, Rfl: 0   DULoxetine (CYMBALTA) 30 MG capsule, Take 1 capsule (30 mg total) by mouth daily., Disp: 30 capsule, Rfl: 1   famotidine (PEPCID) 20 MG tablet, TAKE 1 TABLET(20 MG) BY MOUTH TWICE DAILY, Disp: 180 tablet, Rfl: 0   glucose blood (ONETOUCH ULTRA) test strip, Use as instructed, Disp: 100 each, Rfl: 12   ONE TOUCH CLUB LANCETS MISC, 1 each by Does not apply route daily., Disp: 100 each, Rfl: 11   Semaglutide, 1 MG/DOSE, 4 MG/3ML SOPN, Inject 1 mg as directed once a week., Disp: 3 mL, Rfl: 0   Vitamin D, Ergocalciferol, (DRISDOL) 1.25 MG (50000 UNIT) CAPS capsule, Take 1 capsule (50,000 Units total) by mouth every 7 (seven) days., Disp: 4 capsule, Rfl: 1   Objective:     There were no vitals  filed for this visit.    There is no height or weight on file to calculate BMI.    Physical Exam:    ***   Electronically signed by:  Lynn [name] D.[name] 11:56 AM 02/20/22

## 2022-02-21 ENCOUNTER — Ambulatory Visit (INDEPENDENT_AMBULATORY_CARE_PROVIDER_SITE_OTHER): Payer: Managed Care, Other (non HMO) | Admitting: Sports Medicine

## 2022-02-21 ENCOUNTER — Ambulatory Visit: Payer: Managed Care, Other (non HMO) | Admitting: Family Medicine

## 2022-02-21 VITALS — HR 94 | Ht 67.0 in | Wt 302.0 lb

## 2022-02-21 DIAGNOSIS — M25512 Pain in left shoulder: Secondary | ICD-10-CM

## 2022-02-21 DIAGNOSIS — M75112 Incomplete rotator cuff tear or rupture of left shoulder, not specified as traumatic: Secondary | ICD-10-CM | POA: Diagnosis not present

## 2022-02-21 DIAGNOSIS — G8929 Other chronic pain: Secondary | ICD-10-CM

## 2022-02-21 DIAGNOSIS — M25511 Pain in right shoulder: Secondary | ICD-10-CM

## 2022-02-21 NOTE — Patient Instructions (Addendum)
Good to see you  Ortho referral  As needed follow up  

## 2022-02-27 ENCOUNTER — Ambulatory Visit: Payer: Managed Care, Other (non HMO) | Admitting: Family Medicine

## 2022-02-27 ENCOUNTER — Encounter: Payer: Self-pay | Admitting: Family Medicine

## 2022-02-27 VITALS — BP 130/86 | HR 77 | Temp 97.9°F | Wt 306.2 lb

## 2022-02-27 DIAGNOSIS — R0789 Other chest pain: Secondary | ICD-10-CM

## 2022-02-27 DIAGNOSIS — R059 Cough, unspecified: Secondary | ICD-10-CM

## 2022-02-27 LAB — POC COVID19 BINAXNOW: SARS Coronavirus 2 Ag: NEGATIVE

## 2022-02-27 LAB — POCT INFLUENZA A/B
Influenza A, POC: NEGATIVE
Influenza B, POC: NEGATIVE

## 2022-02-27 MED ORDER — ACETAMINOPHEN 500 MG PO TABS: 1000.0000 mg | ORAL_TABLET | Freq: Four times a day (QID) | ORAL | 0 refills | Status: DC | PRN

## 2022-02-27 MED ORDER — BENZONATATE 100 MG PO CAPS: 100.0000 mg | ORAL_CAPSULE | Freq: Two times a day (BID) | ORAL | 0 refills | Status: DC | PRN

## 2022-02-27 NOTE — Patient Instructions (Signed)
Use tessalon perles for cough.  Use tylenol for chest wall pain.  Go to ED if severe chest pain or palpations, or shortness of breath

## 2022-02-27 NOTE — Progress Notes (Signed)
Assessment/Plan:   Problem List Items Addressed This Visit   None Visit Diagnoses     Cough, unspecified type    -  Primary   Relevant Medications   benzonatate (TESSALON) 100 MG capsule   Other Relevant Orders   POC COVID-19 (Completed)   POCT Influenza A/B (Completed)   Chest wall pain       Relevant Medications   acetaminophen (TYLENOL) 500 MG tablet      Patient admitted for likely residual cough from prior URI.  I do not believe patient is experiencing symptoms of COVID from recent exposure.  Patient chest pain is likely costochondritis versus chest wall pain.  ED and return precautions discussed Will treat symptomatically with Tessalon Perles and other OTC medications as needed    Subjective:  HPI:  Lynn [name] is a 36 y.o. female who has Essential hypertension; Chronic tachycardia; Type 2 diabetes mellitus with other specified complication ([place]); Generalized anxiety disorder with panic attacks; Depression, major, single episode, complete remission ([place]); Family history of heart attack; LVH (left ventricular hypertrophy) due to hypertensive disease, without heart failure; Morbid obesity ([place]); Acute pain of both shoulders; Nerve pain; Chest pain; Epigastric pain; Cervical radicular pain; Snoring; and Other fatigue on their problem list..   She  has a past medical history of Carpal tunnel syndrome, left, Diabetes ([place]), Hypertension, Iron deficiency, Panic attacks, PCOS (polycystic ovarian syndrome), and Tachycardia..   She presents with chief complaint of Cough (Low grade fever for 1 day, cough X 1.5 weeks. Coworker tested positive. ) .     Cough Duration: 1.5 weeks Did have Covid exposure 5 days ago Patient reports episodic chest pain.  It occurred two days ago.  It was  in mid sternum.  She is not feeling during this visit.  It occurs when moving (e.g. cleaning).  Pain is a 7/10.  Episodes last a few minutes.  The level of pain, frequency, episode duration are not  changing.  Patient has SOB when coughing, but no dyspnea on exertion.  She denies wheezing or fevers.  She is former smoker but does not smoke at this time.  Results for orders placed or performed in visit on 02/27/22  POC COVID-19  Result Value Ref Range   SARS Coronavirus 2 Ag Negative Negative  POCT Influenza A/B  Result Value Ref Range   Influenza A, POC Negative Negative   Influenza B, POC Negative Negative     Past Surgical History:  Procedure Laterality Date   ADENOIDECTOMY     CESAREAN SECTION     TONSILLECTOMY      Outpatient Medications Prior to Visit  Medication Sig Dispense Refill   aspirin EC 325 MG tablet Take 325 mg by mouth daily.     Blood Glucose Monitoring Suppl (ONE TOUCH ULTRA 2) w/Device KIT 1 each by Does not apply route daily. 1 kit 0   busPIRone (BUSPAR) 5 MG tablet Take 1 tablet (5 mg total) by mouth 2 (two) times daily as needed. 30 tablet 1   carvedilol (COREG) 25 MG tablet Take 1 tablet (25 mg total) by mouth 2 (two) times daily with a meal. 180 tablet 0   DULoxetine (CYMBALTA) 30 MG capsule Take 1 capsule (30 mg total) by mouth daily. 30 capsule 1   famotidine (PEPCID) 20 MG tablet TAKE 1 TABLET(20 MG) BY MOUTH TWICE DAILY 180 tablet 0   glucose blood (ONETOUCH ULTRA) test strip Use as instructed 100 each 12   ONE TOUCH CLUB LANCETS  MISC 1 each by Does not apply route daily. 100 each 11   Semaglutide, 1 MG/DOSE, 4 MG/3ML SOPN Inject 1 mg as directed once a week. 3 mL 0   Vitamin D, Ergocalciferol, (DRISDOL) 1.25 MG (50000 UNIT) CAPS capsule Take 1 capsule (50,000 Units total) by mouth every 7 (seven) days. 4 capsule 1   acetaminophen (TYLENOL) 500 MG tablet Take 1,000 mg by mouth every 6 (six) hours as needed for moderate pain or headache.     No facility-administered medications prior to visit.    Family History  Problem Relation Age of Onset   Heart disease Mother    Diabetes Mother    Hypertension Mother    Kidney disease Mother    Heart  failure Mother    Heart attack Mother 46   Arthritis Mother    Asthma Mother    COPD Mother    Depression Mother    Hyperlipidemia Mother    Mental illness Mother    Heart disease Father    Heart attack Father 75   COPD Father    Hyperlipidemia Father    Hypertension Father    Mental illness Father    Arthritis Sister    Depression Sister    Diabetes Sister    Hypertension Sister    Mental illness Sister    Miscarriages / Stillbirths Sister    Heart failure Maternal Grandmother    Arthritis Maternal Grandmother    Alcohol abuse Maternal Grandmother    COPD Maternal Grandmother    Diabetes Maternal Grandmother    Hearing loss Maternal Grandmother    Heart disease Maternal Grandmother    Hyperlipidemia Maternal Grandmother    Hypertension Maternal Grandmother    Stroke Maternal Grandmother    Miscarriages / Stillbirths Maternal Grandmother    Mental illness Maternal Grandmother    Heart attack Maternal Grandmother    Heart failure Maternal Grandfather    Alcohol abuse Maternal Grandfather    Arthritis Maternal Grandfather    Early death Maternal Grandfather    Heart disease Maternal Grandfather    Hyperlipidemia Maternal Grandfather    Hypertension Maternal Grandfather    Heart attack Maternal Grandfather 60   Mental illness Maternal Grandfather    Heart failure Paternal Grandmother    Arthritis Paternal Grandmother    Diabetes Paternal Grandmother    Heart attack Paternal 33 / Stillbirths Paternal Grandmother    Mental illness Paternal Grandmother    Kidney disease Paternal Grandmother    Intellectual disability Paternal Grandmother    Hypertension Paternal Grandmother    Hyperlipidemia Paternal Grandmother    Heart disease Paternal Grandmother    Heart failure Paternal Grandfather    Arthritis Paternal Grandfather    Heart attack Paternal Grandfather    Mental illness Paternal Grandfather    Hearing loss Paternal Grandfather    Heart  disease Paternal Grandfather    Hyperlipidemia Paternal Grandfather    Hypertension Paternal Grandfather    Alcohol abuse Sister    Depression Sister    Diabetes Sister    Heart disease Sister    Heart attack Sister 74   Arthritis Sister    Kidney disease Sister    Transient ischemic attack Sister     Social History   Socioeconomic History   Marital status: Single    Spouse name: Not on file   Number of children: 1   Years of education: some college   Highest education level: Not on file  Occupational History  Not on file  Tobacco Use   Smoking status: Former    Packs/day: 1.00    Years: 7.00    Total pack years: 7.00    Types: Cigarettes, E-cigarettes    Quit date: 10/29/2021    Years since quitting: 0.3   Smokeless tobacco: Never  Vaping Use   Vaping Use: Former  Substance and Sexual Activity   Alcohol use: Yes    Comment: rarely   Drug use: No   Sexual activity: Yes    Birth control/protection: Condom  Other Topics Concern   Not on file  Social History Narrative      Social Determinants of Health   Financial Resource Strain: Not on file  Food Insecurity: Not on file  Transportation Needs: Not on file  Physical Activity: Not on file  Stress: Not on file  Social Connections: Not on file  Intimate Partner Violence: Not on file                                                                                                 Objective:  Physical Exam: BP 130/86 (BP Location: Right Arm, Patient Position: Sitting, Cuff Size: Large)   Pulse 77   Temp 97.9 F (36.6 C) (Temporal)   Wt (!) 306 lb 3.2 oz (138.9 kg)   LMP 01/28/2022 (Approximate) Comment: hx PCOS  SpO2 98%   BMI 47.96 kg/m    General: No acute distress. Awake and conversant.  Eyes: Normal conjunctiva, anicteric. Round symmetric pupils.  ENT: Hearing grossly intact. No nasal discharge.  Neck: Neck is supple. No masses or thyromegaly.  Respiratory: Respirations are non-labored.  Clear to  bilaterally Skin: Warm. No rashes or ulcers.  Psych: Alert and oriented. Cooperative, Appropriate mood and affect, Normal judgment.  CV: No cyanosis or JVD MSK: Midsternal is tender to palpation, normal ambulation. No clubbing  Neuro: Sensation and CN II-XII grossly normal.        Alesia Banda, MD, MS

## 2022-03-01 ENCOUNTER — Ambulatory Visit: Payer: Managed Care, Other (non HMO) | Admitting: Nurse Practitioner

## 2022-03-01 ENCOUNTER — Telehealth: Payer: Self-pay | Admitting: Nurse Practitioner

## 2022-03-01 NOTE — Telephone Encounter (Signed)
Pt called to cancel 11/30 OV with [facility]. This is her second

## 2022-03-02 ENCOUNTER — Other Ambulatory Visit: Payer: Self-pay | Admitting: Family Medicine

## 2022-03-05 ENCOUNTER — Encounter: Payer: Self-pay | Admitting: Nurse Practitioner

## 2022-03-05 DIAGNOSIS — R000 Tachycardia, unspecified: Secondary | ICD-10-CM

## 2022-03-05 DIAGNOSIS — I10 Essential (primary) hypertension: Secondary | ICD-10-CM

## 2022-03-05 MED ORDER — CARVEDILOL 25 MG PO TABS: 25.0000 mg | ORAL_TABLET | Freq: Two times a day (BID) | ORAL | 1 refills | Status: DC

## 2022-03-06 ENCOUNTER — Encounter: Payer: Self-pay | Admitting: Nurse Practitioner

## 2022-03-06 NOTE — Telephone Encounter (Signed)
same day cancel/2nd no show, fee generated, final warning letter sent

## 2022-03-06 NOTE — Telephone Encounter (Signed)
Noted  

## 2022-03-12 ENCOUNTER — Other Ambulatory Visit (HOSPITAL_COMMUNITY): Payer: Self-pay

## 2022-03-12 ENCOUNTER — Encounter: Payer: Self-pay | Admitting: Nurse Practitioner

## 2022-03-12 ENCOUNTER — Ambulatory Visit: Payer: Managed Care, Other (non HMO) | Admitting: Nurse Practitioner

## 2022-03-12 ENCOUNTER — Telehealth: Payer: Self-pay

## 2022-03-12 VITALS — BP 126/82 | HR 74 | Ht 67.0 in | Wt 310.2 lb

## 2022-03-12 DIAGNOSIS — M25511 Pain in right shoulder: Secondary | ICD-10-CM

## 2022-03-12 DIAGNOSIS — M5412 Radiculopathy, cervical region: Secondary | ICD-10-CM

## 2022-03-12 DIAGNOSIS — F410 Panic disorder [episodic paroxysmal anxiety] without agoraphobia: Secondary | ICD-10-CM

## 2022-03-12 DIAGNOSIS — K429 Umbilical hernia without obstruction or gangrene: Secondary | ICD-10-CM

## 2022-03-12 DIAGNOSIS — R55 Syncope and collapse: Secondary | ICD-10-CM | POA: Diagnosis not present

## 2022-03-12 DIAGNOSIS — M25512 Pain in left shoulder: Secondary | ICD-10-CM

## 2022-03-12 DIAGNOSIS — F411 Generalized anxiety disorder: Secondary | ICD-10-CM | POA: Diagnosis not present

## 2022-03-12 DIAGNOSIS — R000 Tachycardia, unspecified: Secondary | ICD-10-CM

## 2022-03-12 DIAGNOSIS — E559 Vitamin D deficiency, unspecified: Secondary | ICD-10-CM

## 2022-03-12 DIAGNOSIS — R0683 Snoring: Secondary | ICD-10-CM

## 2022-03-12 MED ORDER — TIRZEPATIDE 2.5 MG/0.5ML ~~LOC~~ SOAJ: 2.5000 mg | SUBCUTANEOUS | 1 refills | Status: DC

## 2022-03-12 MED ORDER — CYCLOBENZAPRINE HCL 10 MG PO TABS: 10.0000 mg | ORAL_TABLET | Freq: Three times a day (TID) | ORAL | 1 refills | Status: DC | PRN

## 2022-03-12 MED ORDER — VITAMIN D (ERGOCALCIFEROL) 1.25 MG (50000 UNIT) PO CAPS: 50000.0000 [IU] | ORAL_CAPSULE | ORAL | 0 refills | Status: DC

## 2022-03-12 MED ORDER — DULOXETINE HCL 30 MG PO CPEP: 30.0000 mg | ORAL_CAPSULE | Freq: Every day | ORAL | 1 refills | Status: DC

## 2022-03-12 NOTE — Telephone Encounter (Signed)
Received notification from [facility] regarding a prior authorization for [facility] 2.'5mg'$ /0.29m.   Authorization has been APPROVED until 03/13/2023.   [facility] spoke with the pharmacist to process the claim. Per test claim, copay for 28 days supply is $1,109.89.    Authorization #  [id number]

## 2022-03-12 NOTE — Assessment & Plan Note (Signed)
She had a syncopal episode on 02/14/2022.  She went to the emergency room and then urgent care and was told that it was from working on third shift.  She has been taking carvedilol 25 mg twice a day which had helped with her syncopal episodes from the past.  She denies chest pain currently.  Recent labs and EKG reviewed which were unrevealing.  Will place a referral to cardiology.

## 2022-03-12 NOTE — Assessment & Plan Note (Signed)
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  She is awaiting orthopedic referral.  Will have her restart Flexeril 10 mg as needed for muscle spasms.

## 2022-03-12 NOTE — Patient Instructions (Signed)
It was great to see you!  I have placed a referral to cardiology for you.   I have refilled your cymbalta and muscle relaxer.  Start mounjaro once a week injection 2.'5mg'$  weekly.   Let's follow-up in 3 months, sooner if you have concerns.  If a referral was placed today, you will be contacted for an appointment. Please note that routine referrals can sometimes take up to 3-4 weeks to process. Please call our office if you haven't heard anything after this time frame.  Take care,  Vance Peper, NP

## 2022-03-12 NOTE — Assessment & Plan Note (Signed)
She is taking carvedilol 25 mg twice a day.  With recent syncopal episode, will place referral to cardiology.

## 2022-03-12 NOTE — Assessment & Plan Note (Signed)
Chronic, stable.  She has been taking duloxetine 30 mg daily which has really helped her symptoms.  Will continue this regimen.

## 2022-03-12 NOTE — Assessment & Plan Note (Signed)
She has seen a surgeon in the past and was told to lose 30% of her weight which she has completed.  She would like a referral back to surgery to see if they can help fix her umbilical hernia.  Referral placed today.

## 2022-03-12 NOTE — Assessment & Plan Note (Signed)
She endorses snoring, some fatigue.  She does have possible sleep apnea.  She states that she would never be able to wear a CPAP at night.  Will hold off on testing right now.

## 2022-03-12 NOTE — Assessment & Plan Note (Signed)
Vitamin D refill sent to the pharmacy today.  Will check vitamin D levels with next labs.

## 2022-03-12 NOTE — Telephone Encounter (Signed)
Pharmacy Patient Advocate Encounter   Received notification from [facility] that prior authorization for Mounjaro 2.'5mg'$ /0.35m is required/requested.   PA submitted on 03/12/22 to [employer] via CoverMyMeds  Key  [id number]  Status is pending

## 2022-03-12 NOTE — Assessment & Plan Note (Signed)
Chronic, improving.  She states that the numbness has stopped going down her arm since starting the duloxetine 30 mg.  Will have her continue this regimen daily.

## 2022-03-12 NOTE — Progress Notes (Signed)
Established Patient Office Visit  Subjective   Patient ID: Lynn [name], female    DOB: 1985/05/27  Age: 36 y.o. MRN: [id number]  Chief Complaint  Patient presents with   Hospitalization [facility] follow up and follow up  for diabetes , not able 1 mg made sick made her heat race. Discuss refill on medication for muscle relaxer for shoulder     HPI  Lynn [name] is here to follow-up on diabetes, shoulder pain, and cervical radiculopathy.   She recently went to the emergency room on 02/14/2022 for syncope.  She states that she was feeling off all morning, she checked her blood pressure and it was normal.  Then she had a syncopal episode and woke up on the floor.  She went to the emergency room and had labs done, along with an EKG.  The wait was over 46 hours and so she left without being seen and went to an urgent care.  All of her labs that were normal and was told that it is because she is working third shift that she had this episode.  She denies any current dizziness, chest pain, palpitations, further syncopal episodes.  She states that she increased to the Ozempic 1 mg dose and it made her feel really nauseous and made her heart race.  She ended up stopping taking this.  She has heard about [facility] and is wondering if she can try this medication instead.  She started to take the Cymbalta for her neck pain and states that the numbness and tingling going down her arm has stopped.  She is not having any side effects to this medication.  She has been following with orthopedics and found out that she tore her rotator cuff on her left shoulder about 50%.  She is going to be following with orthopedics soon.  She is wondering if she can get a muscle relaxer to help with the pain.    ROS See pertinent positives and negatives per HPI.    Objective:     BP 126/82   Pulse 74   Ht '5\' 7"'$  (1.702 m)   Wt (!) 310 lb 3.2 oz (140.7 kg)   LMP 01/28/2022 (Approximate) Comment: hx  PCOS  BMI 48.58 kg/m  BP Readings from Last 3 Encounters:  03/12/22 126/82  02/27/22 130/86  02/14/22 138/84   Wt Readings from Last 3 Encounters:  03/12/22 (!) 310 lb 3.2 oz (140.7 kg)  02/27/22 (!) 306 lb 3.2 oz (138.9 kg)  02/21/22 (!) 302 lb (137 kg)      Physical Exam Vitals and nursing note reviewed.  Constitutional:      General: She is not in acute distress.    Appearance: Normal appearance.  HENT:     Head: Normocephalic.  Eyes:     Conjunctiva/sclera: Conjunctivae normal.  Cardiovascular:     Rate and Rhythm: Normal rate and regular rhythm.     Pulses: Normal pulses.     Heart sounds: Normal heart sounds.  Pulmonary:     Effort: Pulmonary effort is normal.     Breath sounds: Normal breath sounds.  Musculoskeletal:     Cervical back: Normal range of motion.  Skin:    General: Skin is warm.  Neurological:     General: No focal deficit present.     Mental Status: She is alert and oriented to person, place, and time.  Psychiatric:        Mood and Affect: Mood normal.  Behavior: Behavior normal.        Thought Content: Thought content normal.        Judgment: Judgment normal.     Assessment & Plan:   Problem List Items Addressed This Visit       Other   Chronic tachycardia    She is taking carvedilol 25 mg twice a day.  With recent syncopal episode, will place referral to cardiology.      Relevant Orders   Ambulatory referral to Cardiology   Generalized anxiety disorder with panic attacks    Chronic, stable.  She has been taking duloxetine 30 mg daily which has really helped her symptoms.  Will continue this regimen.      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  She is awaiting orthopedic referral.  Will have her restart Flexeril 10 mg as needed for muscle spasms.      Cervical radicular pain    Chronic,  improving.  She states that the numbness has stopped going down her arm since starting the duloxetine 30 mg.  Will have her continue this regimen daily.      Snoring    She endorses snoring, some fatigue.  She does have possible sleep apnea.  She states that she would never be able to wear a CPAP at night.  Will hold off on testing right now.      Syncope and collapse - Primary    She had a syncopal episode on 02/14/2022.  She went to the emergency room and then urgent care and was told that it was from working on third shift.  She has been taking carvedilol 25 mg twice a day which had helped with her syncopal episodes from the past.  She denies chest pain currently.  Recent labs and EKG reviewed which were unrevealing.  Will place a referral to cardiology.      Relevant Orders   Ambulatory referral to Cardiology   Umbilical hernia without obstruction and without gangrene    She has seen a surgeon in the past and was told to lose 30% of her weight which she has completed.  She would like a referral back to surgery to see if they can help fix her umbilical hernia.  Referral placed today.      Relevant Orders   Ambulatory referral to General Surgery   Vitamin D deficiency    Vitamin D refill sent to the pharmacy today.  Will check vitamin D levels with next labs.      Relevant Medications   Vitamin D, Ergocalciferol, (DRISDOL) 1.25 MG (50000 UNIT) CAPS capsule    Return in about 3 months (around 06/11/2022) for Diabetes.    Charyl Dancer, NP

## 2022-04-02 DIAGNOSIS — C801 Malignant (primary) neoplasm, unspecified: Secondary | ICD-10-CM

## 2022-04-02 HISTORY — DX: Malignant (primary) neoplasm, unspecified: C80.1

## 2022-04-19 ENCOUNTER — Ambulatory Visit: Payer: Managed Care, Other (non HMO) | Admitting: Cardiology

## 2022-05-03 ENCOUNTER — Encounter: Payer: Self-pay | Admitting: Nurse Practitioner

## 2022-05-07 ENCOUNTER — Ambulatory Visit (INDEPENDENT_AMBULATORY_CARE_PROVIDER_SITE_OTHER): Payer: Managed Care, Other (non HMO) | Admitting: Nurse Practitioner

## 2022-05-07 ENCOUNTER — Encounter: Payer: Self-pay | Admitting: Nurse Practitioner

## 2022-05-07 VITALS — BP 126/76 | HR 102 | Temp 97.9°F | Ht 67.0 in | Wt 322.0 lb

## 2022-05-07 DIAGNOSIS — N644 Mastodynia: Secondary | ICD-10-CM | POA: Diagnosis not present

## 2022-05-07 MED ORDER — ACCU-CHEK AVIVA PLUS VI STRP: ORAL_STRIP | 12 refills | Status: AC

## 2022-05-07 MED ORDER — OZEMPIC (0.25 OR 0.5 MG/DOSE) 2 MG/1.5ML ~~LOC~~ SOPN: 0.2500 mg | PEN_INJECTOR | SUBCUTANEOUS | 3 refills | Status: DC

## 2022-05-07 MED ORDER — ACCU-CHEK AVIVA PLUS W/DEVICE KIT: 1.0000 | PACK | Freq: Every day | 0 refills | Status: DC

## 2022-05-07 NOTE — Patient Instructions (Signed)
You will be contacted to schedule appt for mammogram and [place]. Ok to alternate between warm and cold compress as needed. Ok to use ibuprofen or tylenol OTC if needed

## 2022-05-07 NOTE — Progress Notes (Signed)
Established Patient Visit  Patient: Lynn [name]   DOB: February 10, 1986   37 y.o. Female  MRN: [id number] Visit Date: 05/07/2022  Subjective:    Chief Complaint  Patient presents with   Acute Visit    Left breast pain with lump radiating to axilla   HPI [name] presents with  left breast tenderness and change in skin x1week, LMP ended 04/27/2022.  No fever or no nipple discharge or breast trauma. Denies any Fhx of breast cancer.  Reviewed medical, surgical, and social history today  Medications: Outpatient Medications Prior to Visit  Medication Sig   acetaminophen (TYLENOL) 500 MG tablet Take 2 tablets (1,000 mg total) by mouth every 6 (six) hours as needed for moderate pain or headache.   aspirin EC 325 MG tablet Take 325 mg by mouth daily.   busPIRone (BUSPAR) 5 MG tablet Take 1 tablet (5 mg total) by mouth 2 (two) times daily as needed.   carvedilol (COREG) 25 MG tablet Take 1 tablet (25 mg total) by mouth 2 (two) times daily with a meal.   cyclobenzaprine (FLEXERIL) 10 MG tablet Take 1 tablet (10 mg total) by mouth 3 (three) times daily as needed for muscle spasms.   famotidine (PEPCID) 20 MG tablet TAKE 1 TABLET(20 MG) BY MOUTH TWICE DAILY   Vitamin D, Ergocalciferol, (DRISDOL) 1.25 MG (50000 UNIT) CAPS capsule Take 1 capsule (50,000 Units total) by mouth every 7 (seven) days.   Blood Glucose Monitoring Suppl (ONE TOUCH ULTRA 2) w/Device KIT 1 each by Does not apply route daily. (Patient not taking: Reported on 05/07/2022)   DULoxetine (CYMBALTA) 30 MG capsule Take 1 capsule (30 mg total) by mouth daily. (Patient not taking: Reported on 05/07/2022)   glucose blood (ONETOUCH ULTRA) test strip Use as instructed (Patient not taking: Reported on 05/07/2022)   ONE TOUCH CLUB LANCETS MISC 1 each by Does not apply route daily. (Patient not taking: Reported on 05/07/2022)   tirzepatide [facility]) 2.5 MG/0.5ML Pen Inject 2.5 mg into the skin once a week. (Patient not taking: Reported  on 05/07/2022)   No facility-administered medications prior to visit.   Reviewed past medical and social history.   ROS per HPI above      Objective:  BP 126/76 (BP Location: Right Arm)   Pulse (!) 102   Temp 97.9 F (36.6 C) (Temporal)   Ht '5\' 7"'$  (1.702 m)   Wt (!) 322 lb (146.1 kg)   LMP 04/23/2022 (Approximate)   SpO2 97%   BMI 50.43 kg/m      Physical Exam Vitals reviewed.  Chest:  Breasts:    Breasts are symmetrical.     Right: No swelling, bleeding, inverted nipple, mass, nipple discharge, skin change or tenderness.     Left: Skin change present. No swelling, bleeding, inverted nipple, mass, nipple discharge or tenderness.    Lymphadenopathy:     Upper Body:     Right upper body: No supraclavicular, axillary or pectoral adenopathy.     Left upper body: No supraclavicular, axillary or pectoral adenopathy.  Neurological:     Mental Status: She is alert.     No results found for any visits on 05/07/22.    Assessment & Plan:    Problem List Items Addressed This Visit   None Visit Diagnoses     Breast tenderness in female    -  Primary   Relevant Orders   MM  Digital Diagnostic Bilat   US BREAST COMPLETE UNI LEFT INC AXILLA      Return if symptoms worsen or fail to improve.     Wilfred Lacy, NP

## 2022-05-21 ENCOUNTER — Ambulatory Visit
Admission: RE | Admit: 2022-05-21 | Discharge: 2022-05-21 | Disposition: A | Discharge status: Home / Self Care | Payer: Managed Care, Other (non HMO) | Source: Ambulatory Visit | Attending: Nurse Practitioner | Admitting: Nurse Practitioner

## 2022-05-21 ENCOUNTER — Other Ambulatory Visit: Payer: Self-pay | Admitting: Nurse Practitioner

## 2022-05-21 DIAGNOSIS — N6320 Unspecified lump in the left breast, unspecified quadrant: Secondary | ICD-10-CM

## 2022-05-21 DIAGNOSIS — N644 Mastodynia: Secondary | ICD-10-CM

## 2022-05-21 DIAGNOSIS — R599 Enlarged lymph nodes, unspecified: Secondary | ICD-10-CM

## 2022-05-22 ENCOUNTER — Telehealth: Payer: Self-pay | Admitting: Nurse Practitioner

## 2022-05-22 NOTE — Telephone Encounter (Signed)
LVM to CB Need to discuss breast US results

## 2022-05-29 ENCOUNTER — Ambulatory Visit
Admission: RE | Admit: 2022-05-29 | Discharge: 2022-05-29 | Disposition: A | Discharge status: Home / Self Care | Payer: Managed Care, Other (non HMO) | Source: Ambulatory Visit | Attending: Nurse Practitioner

## 2022-05-29 ENCOUNTER — Ambulatory Visit
Admission: RE | Admit: 2022-05-29 | Discharge: 2022-05-29 | Disposition: A | Discharge status: Home / Self Care | Payer: Managed Care, Other (non HMO) | Source: Ambulatory Visit | Attending: Nurse Practitioner | Admitting: Nurse Practitioner

## 2022-05-29 ENCOUNTER — Other Ambulatory Visit: Payer: Self-pay | Admitting: Nurse Practitioner

## 2022-05-29 DIAGNOSIS — N6320 Unspecified lump in the left breast, unspecified quadrant: Secondary | ICD-10-CM

## 2022-05-29 DIAGNOSIS — N644 Mastodynia: Secondary | ICD-10-CM

## 2022-05-29 DIAGNOSIS — R590 Localized enlarged lymph nodes: Secondary | ICD-10-CM | POA: Diagnosis not present

## 2022-05-29 DIAGNOSIS — R599 Enlarged lymph nodes, unspecified: Secondary | ICD-10-CM

## 2022-05-29 DIAGNOSIS — N6321 Unspecified lump in the left breast, upper outer quadrant: Secondary | ICD-10-CM | POA: Diagnosis not present

## 2022-05-29 HISTORY — PX: BREAST BIOPSY: SHX20

## 2022-05-30 ENCOUNTER — Telehealth: Payer: Self-pay | Admitting: Hematology and Oncology

## 2022-05-30 NOTE — Telephone Encounter (Signed)
Left message for patient to return my call, also sent and email with appointment instructions

## 2022-05-30 NOTE — Telephone Encounter (Signed)
Spoke to patient to confirm upcoming afternoon [facility] clinic appointment on 3/6, paperwork will be sent via e-mail.   Gave location and time, also informed patient that the surgeon's office would be calling as well to get information from them similar to the packet that they will be receiving so make sure to do both.  Reminded patient that all providers will be coming to the clinic to see them HERE and if they had any questions to not hesitate to reach back out to myself or their navigators.

## 2022-06-04 ENCOUNTER — Encounter: Payer: Self-pay | Admitting: *Deleted

## 2022-06-04 DIAGNOSIS — C50812 Malignant neoplasm of overlapping sites of left female breast: Secondary | ICD-10-CM | POA: Insufficient documentation

## 2022-06-04 NOTE — Progress Notes (Signed)
Radiation Oncology         [phone] ________________________________  Name: Lynn [name]        MRN: [id number]  Date of Service: 06/06/2022 DOB: May 07, 1985  CC:Charyl Dancer, NP  Rolm Bookbinder, MD     REFERRING PHYSICIAN: Rolm Bookbinder, MD   DIAGNOSIS: The encounter diagnosis was Malignant neoplasm of overlapping sites of left breast in female, estrogen receptor positive (Lynn [name]).   HISTORY OF PRESENT ILLNESS: Lynn [name] is a 37 y.o. female seen in the multidisciplinary breast clinic for a new diagnosis of left breast cancer. The patient was noted to have tenderness in the left breast and dimpling at that area for about a month and a half.  She underwent diagnostic mammogram that showed an indistinct mass in the upper outer quadrant of the left breast.  Partial examination of the left axilla showed numerous enlarged lymph nodes.  By ultrasound there was skin thickening of the areolar region spanning 5 mm.  In the 2 o'clock position of the left breast there was an irregular mass measuring 3 cm in greatest dimension with internal blood flow and at 1:00 a small irregular mass measuring 7 mm together the lesion in the 1 and 2 o'clock position spans approximately 5 cm.  The left axillary ultrasound showed numerous enlarged lymph nodes a total of 5 abnormal nodes were noted in real-time examination other nodes were present demonstrating normal morphology.  She underwent a biopsy on 05/29/2022 during this, a third mass was identified as well.  Her biopsy in the 2:00 positions showed a grade 3 invasive ductal carcinoma focally suspicious for LVI, the 1 o'clock position also showed grade 3 invasive ductal carcinoma and carcinoma was noted in her left axillary node that was biopsied.  She did undergo another biopsy this morning. Her biopsies from last week however showed ER/PR positive HER2 negative with a Ki-67 of 60% from the 2 o'clock position specimen.  She is seen today to discuss  treatment recommendations of her cancer.    PREVIOUS RADIATION THERAPY: No   PAST MEDICAL HISTORY:  Past Medical History:  Diagnosis Date   Carpal tunnel syndrome, left    Diabetes (HCC)    Hypertension    Iron deficiency    Panic attacks    PCOS (polycystic ovarian syndrome)    Tachycardia        PAST SURGICAL HISTORY: Past Surgical History:  Procedure Laterality Date   ADENOIDECTOMY     BREAST BIOPSY Left 05/29/2022   [place] LT BREAST BX W LOC DEV 1ST LESION IMG BX SPEC US GUIDE 05/29/2022 [facility]   BREAST BIOPSY Left 05/29/2022   [place] LT BREAST BX W LOC DEV EA ADD LESION IMG BX SPEC US GUIDE 05/29/2022 [facility]   CESAREAN SECTION     TONSILLECTOMY       FAMILY HISTORY:  Family History  Problem Relation Age of Onset   Heart disease Mother    Diabetes Mother    Hypertension Mother    Kidney disease Mother    Heart failure Mother    Heart attack Mother 79   Arthritis Mother    Asthma Mother    COPD Mother    Depression Mother    Hyperlipidemia Mother    Mental illness Mother    Bladder Cancer Mother    Heart disease Father    Heart attack Father 53   COPD Father    Hyperlipidemia Father    Hypertension Father    Mental illness  Father    Arthritis Sister    Depression Sister    Diabetes Sister    Hypertension Sister    Mental illness Sister    Miscarriages / [place] Sister    Alcohol abuse Sister    Depression Sister    Diabetes Sister    Heart disease Sister    Heart attack Sister 81   Arthritis Sister    Kidney disease Sister    Transient ischemic attack Sister    Bladder Cancer Maternal Aunt    Colon cancer Maternal Aunt    Brain cancer Maternal Aunt    Heart failure Maternal Grandmother    Arthritis Maternal Grandmother    Alcohol abuse Maternal Grandmother    COPD Maternal Grandmother    Diabetes Maternal Grandmother    Hearing loss Maternal Grandmother    Heart disease Maternal Grandmother    Hyperlipidemia Maternal  Grandmother    Hypertension Maternal Grandmother    Stroke Maternal Grandmother    Miscarriages / Stillbirths Maternal Grandmother    Mental illness Maternal Grandmother    Heart attack Maternal Grandmother    Heart failure Maternal Grandfather    Alcohol abuse Maternal Grandfather    Arthritis Maternal Grandfather    Early death Maternal Grandfather    Heart disease Maternal Grandfather    Hyperlipidemia Maternal Grandfather    Hypertension Maternal Grandfather    Heart attack Maternal Grandfather 60   Mental illness Maternal Grandfather    Heart failure Paternal Grandmother    Arthritis Paternal Grandmother    Diabetes Paternal Grandmother    Heart attack Paternal 61 / Stillbirths Paternal Grandmother    Mental illness Paternal Grandmother    Kidney disease Paternal Grandmother    Intellectual disability Paternal Grandmother    Hypertension Paternal Grandmother    Hyperlipidemia Paternal Grandmother    Heart disease Paternal Grandmother    Heart failure Paternal Grandfather    Arthritis Paternal Grandfather    Heart attack Paternal Grandfather    Mental illness Paternal Grandfather    Hearing loss Paternal Grandfather    Heart disease Paternal Grandfather    Hyperlipidemia Paternal Grandfather    Hypertension Paternal Grandfather      SOCIAL HISTORY:  reports that she quit smoking about 7 months ago. Her smoking use included cigarettes and e-cigarettes. She has a 7.00 pack-year smoking history. She has never used smokeless tobacco. She reports current alcohol use. She reports that she does not use drugs. The patient is engaged to be married and lives in [place]. She works for [employer] and works 3rd shift ad has an 49 year old daughter.    ALLERGIES: Macrobid [nitrofurantoin macrocrystal] and Ibuprofen   MEDICATIONS:  Current Outpatient Medications  Medication Sig Dispense Refill   acetaminophen (TYLENOL) 500 MG tablet Take 2 tablets (1,000 mg  total) by mouth every 6 (six) hours as needed for moderate pain or headache. 30 tablet 0   aspirin EC 325 MG tablet Take 325 mg by mouth daily.     Blood Glucose Monitoring Suppl (ACCU-CHEK AVIVA PLUS) w/Device KIT 1 each by Does not apply route daily at 6 (six) AM. 1 kit 0   busPIRone (BUSPAR) 5 MG tablet Take 1 tablet (5 mg total) by mouth 2 (two) times daily as needed. 30 tablet 1   carvedilol (COREG) 25 MG tablet Take 1 tablet (25 mg total) by mouth 2 (two) times daily with a meal. 180 tablet 1   cyclobenzaprine (FLEXERIL) 10 MG tablet Take  1 tablet (10 mg total) by mouth 3 (three) times daily as needed for muscle spasms. 30 tablet 1   DULoxetine (CYMBALTA) 30 MG capsule Take 1 capsule (30 mg total) by mouth daily. (Patient not taking: Reported on 05/07/2022) 90 capsule 1   famotidine (PEPCID) 20 MG tablet TAKE 1 TABLET(20 MG) BY MOUTH TWICE DAILY 180 tablet 0   glucose blood (ACCU-CHEK AVIVA PLUS) test strip Use as instructed 100 each 12   ONE TOUCH CLUB LANCETS MISC 1 each by Does not apply route daily. (Patient not taking: Reported on 05/07/2022) 100 each 11   Semaglutide,0.25 or 0.'5MG'$ /DOS, (OZEMPIC, 0.25 OR 0.5 MG/DOSE,) 2 MG/1.5ML SOPN Inject 0.25 mg into the skin once a week. Start with 0.'25MG'$  once a week x 4 weeks, then increase to 0.'5MG'$  weekly. 1.5 mL 3   Vitamin D, Ergocalciferol, (DRISDOL) 1.25 MG (50000 UNIT) CAPS capsule Take 1 capsule (50,000 Units total) by mouth every 7 (seven) days. 12 capsule 0   No current facility-administered medications for this visit.     REVIEW OF SYSTEMS: On review of systems, the patient reports that she is doing pretty well. She iis sore after this morning's left breast biopsy. No other complaints are verbalized.     PHYSICAL EXAM:  Wt Readings from Last 3 Encounters:  05/07/22 (!) 322 lb (146.1 kg)  03/12/22 (!) 310 lb 3.2 oz (140.7 kg)  02/27/22 (!) 306 lb 3.2 oz (138.9 kg)   Temp Readings from Last 3 Encounters:  05/07/22 97.9 F (36.6 C)  (Temporal)  02/27/22 97.9 F (36.6 C) (Temporal)  02/14/22 99 F (37.2 C) (Oral)   BP Readings from Last 3 Encounters:  05/07/22 126/76  03/12/22 126/82  02/27/22 130/86   Pulse Readings from Last 3 Encounters:  05/07/22 (!) 102  03/12/22 74  02/27/22 77    In general this is a well appearing Caucasian female in no acute distress. She's alert and oriented x4 and appropriate throughout the examination. Cardiopulmonary assessment is negative for acute distress and she exhibits normal effort. Bilateral breast exam is deferred.    ECOG = 1  0 - Asymptomatic (Fully active, able to carry on all predisease activities without restriction)  1 - Symptomatic but completely ambulatory (Restricted in physically strenuous activity but ambulatory and able to carry out work of a light or sedentary nature. For example, light housework, office work)  2 - Symptomatic, <50% in bed during the day (Ambulatory and capable of all self care but unable to carry out any work activities. Up and about more than 50% of waking hours)  3 - Symptomatic, >50% in bed, but not bedbound (Capable of only limited self-care, confined to bed or chair 50% or more of waking hours)  4 - Bedbound (Completely disabled. Cannot carry on any self-care. Totally confined to bed or chair)  5 - Death   Eustace Pen MM, Creech RH, Tormey DC, et al. [phone]). "Toxicity and response criteria of the [facility]". [place] Oncol. 5 (6): 649-55    LABORATORY DATA:  Lab Results  Component Value Date   WBC 8.0 02/14/2022   HGB 12.0 02/14/2022   HCT 35.7 (L) 02/14/2022   MCV 80.0 02/14/2022   PLT 239 02/14/2022   Lab Results  Component Value Date   NA 136 02/14/2022   K 4.1 02/14/2022   CL 104 02/14/2022   CO2 23 02/14/2022   Lab Results  Component Value Date   ALT 22 02/14/2022   AST  20 02/14/2022   ALKPHOS 46 02/14/2022   BILITOT 0.4 02/14/2022      RADIOGRAPHY: [place] LT BREAST BX W LOC DEV 1ST  LESION IMG BX SPEC US GUIDE  Addendum Date: 05/31/2022   ADDENDUM REPORT: 05/31/2022 09:34 ADDENDUM: Pathology revealed GRADE III INVASIVE DUCTAL CARCINOMA, DUCTAL CARCINOMA IN SITU, HIGH GRADE, LYMPHOVASCULAR INVASION: FOCALLY SUSPICIOUS FOR LYMPHOVASCULAR INVASION of the LEFT breast, 2 o'clock, 5 cmfn, (coil clip). This was found to be concordant by Dr. Ammie Ferrier. Pathology revealed GRADE III INVASIVE DUCTAL CARCINOMA, DUCTAL CARCINOMA IN SITU, HIGH GRADE of the LEFT breast, 1 o'clock, 4 cmfn, (heart clip) . This was found to be concordant by Dr. Ammie Ferrier. Pathology revealed METASTATIC CARCINOMA INVOLVING A LYMPH NODE of the LEFT axilla, (spiral hydromark clip). This was found to be concordant by Dr. Ammie Ferrier. Pathology results were discussed with the patient by telephone. The patient reported doing well after the biopsies with tenderness at the sites. Post biopsy instructions and care were reviewed and questions were answered. The patient was encouraged to call [facility] for any additional concerns. My direct phone number was provided. The patient was referred to [facility] on June 06, 2022, PM clinic, per request. The patient is scheduled for a LEFT breast stereotactic guided biopsy on June 06, 2022 @ 7:30 AM. Further recommendations will be guided by the results of this biopsy. Consideration for a bilateral breast MRI for further evaluation of extent of disease given the high grade histology and age. Pathology results reported by Terie Purser, RN on 05/30/2022. Electronically Signed   By: Ammie Ferrier M.D.   On: 05/31/2022 09:34   Result Date: 05/31/2022 CLINICAL DATA:  37 year old female presenting for ultrasound-guided biopsy of 2 sites in the left breast and a left axillary lymph node. EXAM: ULTRASOUND GUIDED LEFT BREAST CORE NEEDLE BIOPSY x2 AND ULTRASOUND-GUIDED CORE  NEEDLE BIOPSY OF THE LEFT AXILLA COMPARISON:  Previous exam(s). PROCEDURE: I met with the patient and we discussed the procedure of ultrasound-guided biopsy, including benefits and alternatives. We discussed the high likelihood of a successful procedure. We discussed the risks of the procedure, including infection, bleeding, tissue injury, clip migration, and inadequate sampling. Informed written consent was given. The usual time-out protocol was performed immediately prior to the procedure. Lesion quadrant: Upper outer quadrant Using ChloraPrep, sterile technique and 1% Lidocaine as deep local anesthetic, under direct ultrasound visualization, a 14 gauge spring-loaded device was used to perform biopsy of a left breast mass at 2 o'clock, 5 cm from the nipple using an inferior approach. At the conclusion of the procedure a coil shaped tissue marker clip was deployed into the biopsy cavity. ----- Lesion quadrant: Upper outer quadrant Using ChloraPrep, sterile technique and 1% Lidocaine as deep local anesthetic, under direct ultrasound visualization, a 14 gauge spring-loaded device was used to perform biopsy of a mass in the left breast at 1 o'clock, 4 cm from the nipple using an inferior approach. At the conclusion of the procedure a heart shaped tissue marker clip was deployed into the biopsy cavity. ----- Lesion quadrant: Left axilla Using ChloraPrep, sterile technique and 1% Lidocaine with epinephrine as deep local anesthetic, under direct ultrasound visualization, a 14 gauge spring-loaded device was used to perform biopsy of a left axillary lymph node using an inferomedial approach. At the conclusion of the procedure a spiral shaped HydroMARK shaped tissue marker clip was deployed into the biopsy cavity.  Follow up 2 view mammogram was performed and dictated separately. IMPRESSION: 1. Ultrasound guided biopsy of a left breast mass at 2 o'clock (coil clip). No apparent complications. 2. Ultrasound guided biopsy of a  left breast mass at 1 o'clock (heart clip). No apparent complications. 3. Ultrasound guided biopsy of a left axillary lymph node (spiral shaped HydroMARK clip). No apparent complications. Electronically Signed: By: Ammie Ferrier M.D. On: 05/29/2022 09:48  [place] LT BREAST BX W LOC DEV EA ADD LESION IMG BX SPEC US GUIDE  Addendum Date: 05/31/2022   ADDENDUM REPORT: 05/31/2022 09:34 ADDENDUM: Pathology revealed GRADE III INVASIVE DUCTAL CARCINOMA, DUCTAL CARCINOMA IN SITU, HIGH GRADE, LYMPHOVASCULAR INVASION: FOCALLY SUSPICIOUS FOR LYMPHOVASCULAR INVASION of the LEFT breast, 2 o'clock, 5 cmfn, (coil clip). This was found to be concordant by Dr. Ammie Ferrier. Pathology revealed GRADE III INVASIVE DUCTAL CARCINOMA, DUCTAL CARCINOMA IN SITU, HIGH GRADE of the LEFT breast, 1 o'clock, 4 cmfn, (heart clip) . This was found to be concordant by Dr. Ammie Ferrier. Pathology revealed METASTATIC CARCINOMA INVOLVING A LYMPH NODE of the LEFT axilla, (spiral hydromark clip). This was found to be concordant by Dr. Ammie Ferrier. Pathology results were discussed with the patient by telephone. The patient reported doing well after the biopsies with tenderness at the sites. Post biopsy instructions and care were reviewed and questions were answered. The patient was encouraged to call [facility] for any additional concerns. My direct phone number was provided. The patient was referred to [facility] at [facility] on June 06, 2022, PM clinic, per request. The patient is scheduled for a LEFT breast stereotactic guided biopsy on June 06, 2022 @ 7:30 AM. Further recommendations will be guided by the results of this biopsy. Consideration for a bilateral breast MRI for further evaluation of extent of disease given the high grade histology and age. Pathology results reported by Terie Purser, RN on 05/30/2022. Electronically Signed   By:  Ammie Ferrier M.D.   On: 05/31/2022 09:34   Result Date: 05/31/2022 CLINICAL DATA:  37 year old female presenting for ultrasound-guided biopsy of 2 sites in the left breast and a left axillary lymph node. EXAM: ULTRASOUND GUIDED LEFT BREAST CORE NEEDLE BIOPSY x2 AND ULTRASOUND-GUIDED CORE NEEDLE BIOPSY OF THE LEFT AXILLA COMPARISON:  Previous exam(s). PROCEDURE: I met with the patient and we discussed the procedure of ultrasound-guided biopsy, including benefits and alternatives. We discussed the high likelihood of a successful procedure. We discussed the risks of the procedure, including infection, bleeding, tissue injury, clip migration, and inadequate sampling. Informed written consent was given. The usual time-out protocol was performed immediately prior to the procedure. Lesion quadrant: Upper outer quadrant Using ChloraPrep, sterile technique and 1% Lidocaine as deep local anesthetic, under direct ultrasound visualization, a 14 gauge spring-loaded device was used to perform biopsy of a left breast mass at 2 o'clock, 5 cm from the nipple using an inferior approach. At the conclusion of the procedure a coil shaped tissue marker clip was deployed into the biopsy cavity. ----- Lesion quadrant: Upper outer quadrant Using ChloraPrep, sterile technique and 1% Lidocaine as deep local anesthetic, under direct ultrasound visualization, a 14 gauge spring-loaded device was used to perform biopsy of a mass in the left breast at 1 o'clock, 4 cm from the nipple using an inferior approach. At the conclusion of the procedure a heart shaped tissue marker clip was deployed into the biopsy cavity. ----- Lesion quadrant: Left axilla Using  ChloraPrep, sterile technique and 1% Lidocaine with epinephrine as deep local anesthetic, under direct ultrasound visualization, a 14 gauge spring-loaded device was used to perform biopsy of a left axillary lymph node using an inferomedial approach. At the conclusion of the procedure a  spiral shaped HydroMARK shaped tissue marker clip was deployed into the biopsy cavity. Follow up 2 view mammogram was performed and dictated separately. IMPRESSION: 1. Ultrasound guided biopsy of a left breast mass at 2 o'clock (coil clip). No apparent complications. 2. Ultrasound guided biopsy of a left breast mass at 1 o'clock (heart clip). No apparent complications. 3. Ultrasound guided biopsy of a left axillary lymph node (spiral shaped HydroMARK clip). No apparent complications. Electronically Signed: By: Ammie Ferrier M.D. On: 05/29/2022 09:48  [place] AXILLARY NODE CORE BIOPSY LEFT  Addendum Date: 05/31/2022   ADDENDUM REPORT: 05/31/2022 09:34 ADDENDUM: Pathology revealed GRADE III INVASIVE DUCTAL CARCINOMA, DUCTAL CARCINOMA IN SITU, HIGH GRADE, LYMPHOVASCULAR INVASION: FOCALLY SUSPICIOUS FOR LYMPHOVASCULAR INVASION of the LEFT breast, 2 o'clock, 5 cmfn, (coil clip). This was found to be concordant by Dr. Ammie Ferrier. Pathology revealed GRADE III INVASIVE DUCTAL CARCINOMA, DUCTAL CARCINOMA IN SITU, HIGH GRADE of the LEFT breast, 1 o'clock, 4 cmfn, (heart clip) . This was found to be concordant by Dr. Ammie Ferrier. Pathology revealed METASTATIC CARCINOMA INVOLVING A LYMPH NODE of the LEFT axilla, (spiral hydromark clip). This was found to be concordant by Dr. Ammie Ferrier. Pathology results were discussed with the patient by telephone. The patient reported doing well after the biopsies with tenderness at the sites. Post biopsy instructions and care were reviewed and questions were answered. The patient was encouraged to call [facility] for any additional concerns. My direct phone number was provided. The patient was referred to [facility] on June 06, 2022, PM clinic, per request. The patient is scheduled for a LEFT breast stereotactic guided biopsy on June 06, 2022 @ 7:30 AM. Further  recommendations will be guided by the results of this biopsy. Consideration for a bilateral breast MRI for further evaluation of extent of disease given the high grade histology and age. Pathology results reported by Terie Purser, RN on 05/30/2022. Electronically Signed   By: Ammie Ferrier M.D.   On: 05/31/2022 09:34   Result Date: 05/31/2022 CLINICAL DATA:  37 year old female presenting for ultrasound-guided biopsy of 2 sites in the left breast and a left axillary lymph node. EXAM: ULTRASOUND GUIDED LEFT BREAST CORE NEEDLE BIOPSY x2 AND ULTRASOUND-GUIDED CORE NEEDLE BIOPSY OF THE LEFT AXILLA COMPARISON:  Previous exam(s). PROCEDURE: I met with the patient and we discussed the procedure of ultrasound-guided biopsy, including benefits and alternatives. We discussed the high likelihood of a successful procedure. We discussed the risks of the procedure, including infection, bleeding, tissue injury, clip migration, and inadequate sampling. Informed written consent was given. The usual time-out protocol was performed immediately prior to the procedure. Lesion quadrant: Upper outer quadrant Using ChloraPrep, sterile technique and 1% Lidocaine as deep local anesthetic, under direct ultrasound visualization, a 14 gauge spring-loaded device was used to perform biopsy of a left breast mass at 2 o'clock, 5 cm from the nipple using an inferior approach. At the conclusion of the procedure a coil shaped tissue marker clip was deployed into the biopsy cavity. ----- Lesion quadrant: Upper outer quadrant Using ChloraPrep, sterile technique and 1% Lidocaine as deep local anesthetic, under direct ultrasound visualization, a 14 gauge spring-loaded device was  used to perform biopsy of a mass in the left breast at 1 o'clock, 4 cm from the nipple using an inferior approach. At the conclusion of the procedure a heart shaped tissue marker clip was deployed into the biopsy cavity. ----- Lesion quadrant: Left axilla Using ChloraPrep,  sterile technique and 1% Lidocaine with epinephrine as deep local anesthetic, under direct ultrasound visualization, a 14 gauge spring-loaded device was used to perform biopsy of a left axillary lymph node using an inferomedial approach. At the conclusion of the procedure a spiral shaped HydroMARK shaped tissue marker clip was deployed into the biopsy cavity. Follow up 2 view mammogram was performed and dictated separately. IMPRESSION: 1. Ultrasound guided biopsy of a left breast mass at 2 o'clock (coil clip). No apparent complications. 2. Ultrasound guided biopsy of a left breast mass at 1 o'clock (heart clip). No apparent complications. 3. Ultrasound guided biopsy of a left axillary lymph node (spiral shaped HydroMARK clip). No apparent complications. Electronically Signed: By: Ammie Ferrier M.D. On: 05/29/2022 09:48  MM CLIP PLACEMENT LEFT  Result Date: 05/29/2022 CLINICAL DATA:  Post biopsy mammogram of the left breast for clip placement. EXAM: 3D DIAGNOSTIC LEFT MAMMOGRAM POST ULTRASOUND BIOPSY COMPARISON:  Previous exam(s). FINDINGS: 3D Mammographic images were obtained following ultrasound guided biopsy of 2 masses in the left breast and a left axillary lymph node. The biopsy marking clips are in expected position at the sites of biopsy. The clips in the breast are positioned 1.7 cm apart. Medial to both of these clips, there is an additional mass measuring approximately 8 mm. IMPRESSION: 1. Appropriate positioning of the coil shaped biopsy marking clip at the site of biopsy in the upper-outer left breast. 2. Appropriate positioning of the heart shaped biopsy marking clip at the site of biopsy in the upper-outer left breast. 3. Appropriate positioning of the spiral shaped biopsy marking clip at the site of biopsy in the left axilla. 4. There is a 8 mm mass medial to the 2 biopsy sites which has not been biopsied. Stereotactic biopsy will be scheduled to sample this area. The patient is aware. Final  Assessment: Post Procedure Mammograms for Marker Placement Electronically Signed   By: Ammie Ferrier M.D.   On: 05/29/2022 10:06  MM DIAG BREAST TOMO BILATERAL  Result Date: 05/21/2022 CLINICAL DATA:  Tenderness and mass in the LEFT breast, areolar region. Patient has noted skin dimpling in the same location. Patient also has focal tenderness and mass in the LEFT axilla. Symptoms for 1.5 months. No family history of breast cancer. Recent LEFT shoulder pain and rotator cuff injury. EXAM: DIGITAL DIAGNOSTIC BILATERAL MAMMOGRAM WITH TOMOSYNTHESIS; ULTRASOUND LEFT BREAST LIMITED TECHNIQUE: Bilateral digital diagnostic mammography and breast tomosynthesis was performed.; Targeted ultrasound examination of the left breast was performed. COMPARISON:  Baseline mammogram; comparison to shoulder MRI in November 2023. ACR Breast Density Category a: The breasts are almost entirely fatty. FINDINGS: RIGHT BREAST: Mammogram: No suspicious mass, distortion, or microcalcifications are identified to suggest presence of malignancy. Mammographic images were processed with CAD. LEFT BREAST: Mammogram: BB marks area of patient's concern, corresponding to the central-UPPER-OUTER QUADRANT of the LEFT breast. In this region, there is skin thickening corresponding to the areola on physical exam. There is an indistinct mass in the anterior UPPER OUTER QUADRANT of the LEFT breast, deep to the palpable abnormality and further evaluated with ultrasound. Partially imaged LEFT axilla shows numerous enlarged axillary lymph nodes. Mammographic images were processed with CAD. Physical Exam: There is thickening of the  LEFT areola corresponding to the area of patient's concern. This is most prominent when the patient is sitting. There is no associated erythema or significant pain on exam. I palpate soft thickening in the 1-2 o'clock location of the LEFT breast. Ultrasound: Targeted ultrasound is performed, showing skin thickening of the areolar  region of the LEFT breast corresponding to the area of concern. In this region, skin measures up to 5 millimeters. In the 2 o'clock location of the LEFT breast 5 centimeters from the nipple, there is an irregular mass measuring 3.0 x 1.3 x 2.0 centimeters. There is internal blood flow on Doppler evaluation. In the 1 o'clock location 5 centimeters from the nipple, there is a small irregular mass measuring 0.6 x 0.5 x 0.7 centimeters, without internal blood flow. Measured together, the lesion in the 1 o'clock and 2 o'clock locations span approximately 5.0 centimeters. Evaluation of the LEFT axilla shows numerous enlarged lymph nodes, largest measuring 2.5 centimeters in diameter. Total of 5 abnormal lymph nodes are identified at real-time exam. Other lymph nodes are present which demonstrate normal morphology. IMPRESSION: 1. There is asymmetric LEFT areolar skin thickening not associated with erythema or pain. 2. Irregular masses in the 1 o'clock and 2 o'clock locations of the LEFT breast suspicious for malignancy. 3. A total of 5 abnormal LEFT axillary lymph nodes, suspicious for metastatic disease. RECOMMENDATION: 1. Recommend ultrasound-guided core biopsy of mass in the 1 o'clock location of the LEFT breast. 2. Recommend ultrasound-guided core biopsy of mass in the 2 o'clock location of the LEFT breast. 3. Recommend ultrasound-guided core biopsy of 1 of the abnormal LEFT axillary lymph nodes. I have discussed the findings and recommendations with the patient. If applicable, a reminder letter will be sent to the patient regarding the next appointment. BI-RADS CATEGORY  5: Highly suggestive of malignancy. Electronically Signed   By: Nolon Nations M.D.   On: 05/21/2022 17:17  US BREAST LTD UNI LEFT INC AXILLA  Result Date: 05/21/2022 CLINICAL DATA:  Tenderness and mass in the LEFT breast, areolar region. Patient has noted skin dimpling in the same location. Patient also has focal tenderness and mass in the LEFT  axilla. Symptoms for 1.5 months. No family history of breast cancer. Recent LEFT shoulder pain and rotator cuff injury. EXAM: DIGITAL DIAGNOSTIC BILATERAL MAMMOGRAM WITH TOMOSYNTHESIS; ULTRASOUND LEFT BREAST LIMITED TECHNIQUE: Bilateral digital diagnostic mammography and breast tomosynthesis was performed.; Targeted ultrasound examination of the left breast was performed. COMPARISON:  Baseline mammogram; comparison to shoulder MRI in November 2023. ACR Breast Density Category a: The breasts are almost entirely fatty. FINDINGS: RIGHT BREAST: Mammogram: No suspicious mass, distortion, or microcalcifications are identified to suggest presence of malignancy. Mammographic images were processed with CAD. LEFT BREAST: Mammogram: BB marks area of patient's concern, corresponding to the central-UPPER-OUTER QUADRANT of the LEFT breast. In this region, there is skin thickening corresponding to the areola on physical exam. There is an indistinct mass in the anterior UPPER OUTER QUADRANT of the LEFT breast, deep to the palpable abnormality and further evaluated with ultrasound. Partially imaged LEFT axilla shows numerous enlarged axillary lymph nodes. Mammographic images were processed with CAD. Physical Exam: There is thickening of the LEFT areola corresponding to the area of patient's concern. This is most prominent when the patient is sitting. There is no associated erythema or significant pain on exam. I palpate soft thickening in the 1-2 o'clock location of the LEFT breast. Ultrasound: Targeted ultrasound is performed, showing skin thickening of the areolar region of the  LEFT breast corresponding to the area of concern. In this region, skin measures up to 5 millimeters. In the 2 o'clock location of the LEFT breast 5 centimeters from the nipple, there is an irregular mass measuring 3.0 x 1.3 x 2.0 centimeters. There is internal blood flow on Doppler evaluation. In the 1 o'clock location 5 centimeters from the nipple, there  is a small irregular mass measuring 0.6 x 0.5 x 0.7 centimeters, without internal blood flow. Measured together, the lesion in the 1 o'clock and 2 o'clock locations span approximately 5.0 centimeters. Evaluation of the LEFT axilla shows numerous enlarged lymph nodes, largest measuring 2.5 centimeters in diameter. Total of 5 abnormal lymph nodes are identified at real-time exam. Other lymph nodes are present which demonstrate normal morphology. IMPRESSION: 1. There is asymmetric LEFT areolar skin thickening not associated with erythema or pain. 2. Irregular masses in the 1 o'clock and 2 o'clock locations of the LEFT breast suspicious for malignancy. 3. A total of 5 abnormal LEFT axillary lymph nodes, suspicious for metastatic disease. RECOMMENDATION: 1. Recommend ultrasound-guided core biopsy of mass in the 1 o'clock location of the LEFT breast. 2. Recommend ultrasound-guided core biopsy of mass in the 2 o'clock location of the LEFT breast. 3. Recommend ultrasound-guided core biopsy of 1 of the abnormal LEFT axillary lymph nodes. I have discussed the findings and recommendations with the patient. If applicable, a reminder letter will be sent to the patient regarding the next appointment. BI-RADS CATEGORY  5: Highly suggestive of malignancy. Electronically Signed   By: Nolon Nations M.D.   On: 05/21/2022 17:17      IMPRESSION/PLAN: 1. 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 Dr. Lisbeth Renshaw discusses the pathology findings and reviews the nature of node positive breast disease. The consensus from the breast conference includes neoadjuvant chemotherapy followed by breast conservation with lumpectomy with targeted node biopsy or possible ALND  pending response to treatment.  Dr. Lisbeth Renshaw recommends external radiotherapy to the breast and regional nodes to reduce risks of local recurrence followed by antiestrogen therapy. We discussed the risks, benefits, short, and long term  effects of radiotherapy, as well as the curative intent, and the patient is interested in proceeding. Dr. Lisbeth Renshaw discusses the delivery and logistics of radiotherapy and anticipates a course of 6 1/2 weeks of radiotherapy to the left breast and regional nodes.  We will see her back a few weeks after surgery to discuss the simulation process and anticipate we starting radiotherapy about 4-6 weeks after surgery.  2. Possible genetic predisposition to malignancy. The patient is a candidate for genetic testing given her personal history. She will meet with our geneticist today in clinic. 3. Contraceptive Counseling. She is aware of the need to reduce risks of pregnancy during treatment. We will discuss this further as we get closer to the time of treatment. She would need pregnancy testing prior to treatment.   In a visit lasting 60 minutes, greater than 50% of the time was spent face to face reviewing her case, as well as in preparation of, discussing, and coordinating the patient's care.  The above documentation reflects my direct findings during this shared patient visit. Please see the separate note by Dr. Lisbeth Renshaw on this date for the remainder of the patient's plan of care.    Lynn [name], [facility]    **Disclaimer: This note was dictated with voice recognition software. Similar sounding words can inadvertently be transcribed and this note may contain transcription  errors which may not have been corrected upon publication of note.**

## 2022-06-06 ENCOUNTER — Inpatient Hospital Stay (HOSPITAL_BASED_OUTPATIENT_CLINIC_OR_DEPARTMENT_OTHER): Payer: Managed Care, Other (non HMO) | Admitting: Hematology and Oncology

## 2022-06-06 ENCOUNTER — Ambulatory Visit
Admission: RE | Admit: 2022-06-06 | Discharge: 2022-06-06 | Disposition: A | Discharge status: Home / Self Care | Payer: Managed Care, Other (non HMO) | Source: Ambulatory Visit | Attending: Nurse Practitioner | Admitting: Nurse Practitioner

## 2022-06-06 ENCOUNTER — Encounter: Payer: Self-pay | Admitting: *Deleted

## 2022-06-06 ENCOUNTER — Inpatient Hospital Stay: Payer: Managed Care, Other (non HMO) | Attending: Hematology and Oncology

## 2022-06-06 ENCOUNTER — Ambulatory Visit: Payer: Managed Care, Other (non HMO) | Attending: General Surgery | Admitting: Physical Therapy

## 2022-06-06 ENCOUNTER — Inpatient Hospital Stay: Payer: Managed Care, Other (non HMO) | Admitting: Genetic Counselor

## 2022-06-06 ENCOUNTER — Encounter: Payer: Self-pay | Admitting: Genetic Counselor

## 2022-06-06 ENCOUNTER — Encounter: Payer: Self-pay | Admitting: General Practice

## 2022-06-06 ENCOUNTER — Other Ambulatory Visit: Payer: Self-pay | Admitting: General Surgery

## 2022-06-06 ENCOUNTER — Ambulatory Visit
Admission: RE | Admit: 2022-06-06 | Discharge: 2022-06-06 | Disposition: A | Discharge status: Home / Self Care | Payer: Managed Care, Other (non HMO) | Source: Ambulatory Visit | Attending: Radiation Oncology | Admitting: Radiation Oncology

## 2022-06-06 ENCOUNTER — Other Ambulatory Visit: Payer: Self-pay | Admitting: *Deleted

## 2022-06-06 ENCOUNTER — Other Ambulatory Visit: Payer: Self-pay

## 2022-06-06 VITALS — BP 155/84 | HR 87 | Temp 97.5°F | Resp 18 | Ht 67.0 in | Wt 317.6 lb

## 2022-06-06 DIAGNOSIS — E559 Vitamin D deficiency, unspecified: Secondary | ICD-10-CM | POA: Diagnosis not present

## 2022-06-06 DIAGNOSIS — N6320 Unspecified lump in the left breast, unspecified quadrant: Secondary | ICD-10-CM

## 2022-06-06 DIAGNOSIS — Z5189 Encounter for other specified aftercare: Secondary | ICD-10-CM | POA: Diagnosis not present

## 2022-06-06 DIAGNOSIS — I119 Hypertensive heart disease without heart failure: Secondary | ICD-10-CM | POA: Diagnosis not present

## 2022-06-06 DIAGNOSIS — C50812 Malignant neoplasm of overlapping sites of left female breast: Secondary | ICD-10-CM | POA: Diagnosis present

## 2022-06-06 DIAGNOSIS — C50412 Malignant neoplasm of upper-outer quadrant of left female breast: Secondary | ICD-10-CM | POA: Insufficient documentation

## 2022-06-06 DIAGNOSIS — R000 Tachycardia, unspecified: Secondary | ICD-10-CM | POA: Insufficient documentation

## 2022-06-06 DIAGNOSIS — R1013 Epigastric pain: Secondary | ICD-10-CM | POA: Diagnosis not present

## 2022-06-06 DIAGNOSIS — M25612 Stiffness of left shoulder, not elsewhere classified: Secondary | ICD-10-CM | POA: Diagnosis not present

## 2022-06-06 DIAGNOSIS — K429 Umbilical hernia without obstruction or gangrene: Secondary | ICD-10-CM | POA: Diagnosis not present

## 2022-06-06 DIAGNOSIS — Z5111 Encounter for antineoplastic chemotherapy: Secondary | ICD-10-CM | POA: Diagnosis not present

## 2022-06-06 DIAGNOSIS — R161 Splenomegaly, not elsewhere classified: Secondary | ICD-10-CM | POA: Insufficient documentation

## 2022-06-06 DIAGNOSIS — Z800 Family history of malignant neoplasm of digestive organs: Secondary | ICD-10-CM | POA: Insufficient documentation

## 2022-06-06 DIAGNOSIS — R293 Abnormal posture: Secondary | ICD-10-CM | POA: Diagnosis not present

## 2022-06-06 DIAGNOSIS — Z170 Estrogen receptor positive status [ER+]: Secondary | ICD-10-CM

## 2022-06-06 DIAGNOSIS — M25512 Pain in left shoulder: Secondary | ICD-10-CM | POA: Diagnosis not present

## 2022-06-06 DIAGNOSIS — Z8261 Family history of arthritis: Secondary | ICD-10-CM | POA: Insufficient documentation

## 2022-06-06 DIAGNOSIS — Z8349 Family history of other endocrine, nutritional and metabolic diseases: Secondary | ICD-10-CM | POA: Diagnosis not present

## 2022-06-06 DIAGNOSIS — Z801 Family history of malignant neoplasm of trachea, bronchus and lung: Secondary | ICD-10-CM | POA: Insufficient documentation

## 2022-06-06 DIAGNOSIS — R110 Nausea: Secondary | ICD-10-CM | POA: Insufficient documentation

## 2022-06-06 DIAGNOSIS — N644 Mastodynia: Secondary | ICD-10-CM

## 2022-06-06 DIAGNOSIS — R5383 Other fatigue: Secondary | ICD-10-CM | POA: Insufficient documentation

## 2022-06-06 DIAGNOSIS — G8929 Other chronic pain: Secondary | ICD-10-CM

## 2022-06-06 DIAGNOSIS — E119 Type 2 diabetes mellitus without complications: Secondary | ICD-10-CM | POA: Diagnosis not present

## 2022-06-06 HISTORY — PX: BREAST BIOPSY: SHX20

## 2022-06-06 LAB — CMP (CANCER CENTER ONLY)
ALT: 20 U/L (ref 0–44)
AST: 18 U/L (ref 15–41)
Albumin: 4.3 g/dL (ref 3.5–5.0)
Alkaline Phosphatase: 55 U/L (ref 38–126)
Anion gap: 6 (ref 5–15)
BUN: 14 mg/dL (ref 6–20)
CO2: 29 mmol/L (ref 22–32)
Calcium: 9.2 mg/dL (ref 8.9–10.3)
Chloride: 102 mmol/L (ref 98–111)
Creatinine: 0.82 mg/dL (ref 0.44–1.00)
GFR, Estimated: >60 mL/min (ref >60)
Glucose, Bld: 95 mg/dL (ref 70–99)
Potassium: 4.2 mmol/L (ref 3.5–5.1)
Sodium: 137 mmol/L (ref 135–145)
Total Bilirubin: 0.4 mg/dL (ref 0.3–1.2)
Total Protein: 7.7 g/dL (ref 6.5–8.1)

## 2022-06-06 LAB — CBC WITH DIFFERENTIAL (CANCER CENTER ONLY)
Abs Immature Granulocytes: 0.03 10*3/uL (ref 0.00–0.07)
Basophils Absolute: 0.1 10*3/uL (ref 0.0–0.1)
Basophils Relative: 1 %
Eosinophils Absolute: 0.1 10*3/uL (ref 0.0–0.5)
Eosinophils Relative: 1 %
HCT: 37.6 % (ref 36.0–46.0)
Hemoglobin: 12.8 g/dL (ref 12.0–15.0)
Immature Granulocytes: 0 %
Lymphocytes Relative: 26 %
Lymphs Abs: 2 10*3/uL (ref 0.7–4.0)
MCH: 26.7 pg (ref 26.0–34.0)
MCHC: 34 g/dL (ref 30.0–36.0)
MCV: 78.5 fL — ABNORMAL LOW (ref 80.0–100.0)
Monocytes Absolute: 0.8 10*3/uL (ref 0.1–1.0)
Monocytes Relative: 10 %
Neutro Abs: 4.8 10*3/uL (ref 1.7–7.7)
Neutrophils Relative %: 62 %
Platelet Count: 306 10*3/uL (ref 150–400)
RBC: 4.79 MIL/uL (ref 3.87–5.11)
RDW: 12.7 % (ref 11.5–15.5)
WBC Count: 7.7 10*3/uL (ref 4.0–10.5)
nRBC: 0 % (ref 0.0–0.2)

## 2022-06-06 LAB — GENETIC SCREENING ORDER

## 2022-06-06 NOTE — Progress Notes (Signed)
[place] NOTE  Patient Care Team: Charyl Dancer, NP as PCP - General (Internal Medicine) Waunita Schooner, MD as Consulting Physician (Family Medicine) Waunita Schooner, MD as Consulting Physician (Family Medicine) Nicholas Lose, MD as Consulting Physician (Hematology and Oncology) Rolm Bookbinder, MD as Consulting Physician (General Surgery) Kyung Rudd, MD as Consulting Physician (Radiation Oncology) Rockwell Germany, RN as Oncology Nurse Navigator Mauro Kaufmann, RN as Oncology Nurse Navigator  CHIEF COMPLAINTS/PURPOSE OF CONSULTATION:  Newly diagnosed breast cancer  HISTORY OF PRESENTING ILLNESS:  Lynn [name] 37 y.o. female is here because of recent diagnosis of left breast cancer.  Patient health palpable abnormality in the left breast adjacent to the nipple and the areola which led to mammograms and ultrasounds that revealed 3 areas of abnormality.  The primary breast tumor was 3 cm in size at 2 o'clock position and there was 0.7 mm mass at 1 o'clock position and a 8 mm mass as well detected.  Biopsy of the masses revealed grade 3 IDC with DCIS that was ER/PR positive HER2 negative with a Ki-67 60%.  There were 5 enlarged axillary lymph nodes 1 of which was biopsy-proven to be positive.  She was presented this morning to the multidisciplinary tumor board and she is here today accompanied by her fianc and her mother to discuss her treatment plan.  I reviewed her records extensively and collaborated the history with the patient.  SUMMARY OF ONCOLOGIC HISTORY: Oncology History  Malignant neoplasm of overlapping sites of left breast in female, estrogen receptor positive ([place])  05/29/2022 Initial Diagnosis   Palpable abnormalities in the left breast led to mammograms which revealed 3 masses 0.7 cm at 1 o'clock position, 3 cm at 2 o'clock position, additional nodule 8 mm total span 5 cm, 5 abnormal axillary lymph nodes biopsy of the masses and the lymph node  were positive for grade 3 IDC with high-grade DCIS with LVI, ER 95%, PR 40%, Ki-67 60%, HER2 2+ by IHC FISH negative   06/04/2022 Cancer Staging   Staging form: Breast, AJCC 8th Edition - Clinical stage from 06/04/2022: Stage IIB (cT2, cN1(f), cM0, G3, ER+, PR+, HER2-) - Signed by Hayden Pedro, PA-C on 06/04/2022 Stage prefix: Initial diagnosis Method of lymph node assessment: Core biopsy Histologic grading system: 3 grade system      MEDICAL HISTORY:  Past Medical History:  Diagnosis Date   Carpal tunnel syndrome, left    Diabetes (HCC)    Hypertension    Iron deficiency    Panic attacks    PCOS (polycystic ovarian syndrome)    Tachycardia     SURGICAL HISTORY: Past Surgical History:  Procedure Laterality Date   ADENOIDECTOMY     BREAST BIOPSY Left 05/29/2022   [place] LT BREAST BX W LOC DEV 1ST LESION IMG BX SPEC US GUIDE 05/29/2022 [facility]   BREAST BIOPSY Left 05/29/2022   [place] LT BREAST BX W LOC DEV EA ADD LESION IMG BX SPEC US GUIDE 05/29/2022 [facility]   BREAST BIOPSY Left 06/06/2022   MM LT BREAST BX W LOC DEV 1ST LESION IMAGE BX [place] STEREO GUIDE 06/06/2022 [facility]   CESAREAN SECTION     TONSILLECTOMY      SOCIAL HISTORY: Social History   Socioeconomic History   Marital status: Single    Spouse name: Not on file   Number of children: 1   Years of education: some college   Highest education level: Not on file  Occupational History  Not on file  Tobacco Use   Smoking status: Former    Packs/day: 1.00    Years: 7.00    Total pack years: 7.00    Types: Cigarettes, E-cigarettes    Quit date: 10/29/2021    Years since quitting: 0.6   Smokeless tobacco: Never  Vaping Use   Vaping Use: Former  Substance and Sexual Activity   Alcohol use: Yes    Comment: rarely   Drug use: No   Sexual activity: Yes    Birth control/protection: Condom  Other Topics Concern   Not on file  Social History Narrative      Social Determinants of  Health   Financial Resource Strain: Not on file  Food Insecurity: Not on file  Transportation Needs: Not on file  Physical Activity: Not on file  Stress: Not on file  Social Connections: Not on file  Intimate Partner Violence: Not on file    FAMILY HISTORY: Family History  Problem Relation Age of Onset   Heart disease Mother    Diabetes Mother    Hypertension Mother    Kidney disease Mother    Heart failure Mother    Heart attack Mother 86   Arthritis Mother    Asthma Mother    COPD Mother    Depression Mother    Hyperlipidemia Mother    Mental illness Mother    Bladder Cancer Mother    Heart disease Father    Heart attack Father 70   COPD Father    Hyperlipidemia Father    Hypertension Father    Mental illness Father    Arthritis Sister    Depression Sister    Diabetes Sister    Hypertension Sister    Mental illness Sister    Miscarriages / [place] Sister    Alcohol abuse Sister    Depression Sister    Diabetes Sister    Heart disease Sister    Heart attack Sister 48   Arthritis Sister    Kidney disease Sister    Transient ischemic attack Sister    Heart failure Maternal Grandmother    Arthritis Maternal Grandmother    Alcohol abuse Maternal Grandmother    COPD Maternal Grandmother    Diabetes Maternal Grandmother    Hearing loss Maternal Grandmother    Heart disease Maternal Grandmother    Hyperlipidemia Maternal Grandmother    Hypertension Maternal Grandmother    Stroke Maternal Grandmother    Miscarriages / Stillbirths Maternal Grandmother    Mental illness Maternal Grandmother    Heart attack Maternal Grandmother    Heart failure Maternal Grandfather    Alcohol abuse Maternal Grandfather    Arthritis Maternal Grandfather    Early death Maternal Grandfather    Heart disease Maternal Grandfather    Hyperlipidemia Maternal Grandfather    Hypertension Maternal Grandfather    Heart attack Maternal Grandfather 60   Mental illness Maternal  Grandfather    Heart failure Paternal Grandmother    Arthritis Paternal Grandmother    Diabetes Paternal Grandmother    Heart attack Paternal Grandmother    Miscarriages / Stillbirths Paternal Grandmother    Mental illness Paternal Grandmother    Kidney disease Paternal Grandmother    Intellectual disability Paternal Grandmother    Hypertension Paternal Grandmother    Hyperlipidemia Paternal Grandmother    Heart disease Paternal Grandmother    Heart failure Paternal Grandfather    Arthritis Paternal Grandfather    Heart attack Paternal Grandfather    Mental illness Paternal  Grandfather    Hearing loss Paternal Grandfather    Heart disease Paternal Grandfather    Hyperlipidemia Paternal Grandfather    Hypertension Paternal Grandfather    Bladder Cancer Maternal Aunt    Colon cancer Maternal Aunt    Brain cancer Maternal Aunt    Lung cancer Paternal Aunt     ALLERGIES:  is allergic to macrobid [nitrofurantoin macrocrystal] and ibuprofen.  MEDICATIONS:  Current Outpatient Medications  Medication Sig Dispense Refill   acetaminophen (TYLENOL) 500 MG tablet Take 2 tablets (1,000 mg total) by mouth every 6 (six) hours as needed for moderate pain or headache. 30 tablet 0   aspirin EC 325 MG tablet Take 325 mg by mouth daily.     Blood Glucose Monitoring Suppl (ACCU-CHEK AVIVA PLUS) w/Device KIT 1 each by Does not apply route daily at 6 (six) AM. 1 kit 0   busPIRone (BUSPAR) 5 MG tablet Take 1 tablet (5 mg total) by mouth 2 (two) times daily as needed. 30 tablet 1   carvedilol (COREG) 25 MG tablet Take 1 tablet (25 mg total) by mouth 2 (two) times daily with a meal. 180 tablet 1   cyclobenzaprine (FLEXERIL) 10 MG tablet Take 1 tablet (10 mg total) by mouth 3 (three) times daily as needed for muscle spasms. 30 tablet 1   DULoxetine (CYMBALTA) 30 MG capsule Take 1 capsule (30 mg total) by mouth daily. (Patient not taking: Reported on 05/07/2022) 90 capsule 1   famotidine (PEPCID) 20 MG  tablet TAKE 1 TABLET(20 MG) BY MOUTH TWICE DAILY 180 tablet 0   glucose blood (ACCU-CHEK AVIVA PLUS) test strip Use as instructed 100 each 12   ONE TOUCH CLUB LANCETS MISC 1 each by Does not apply route daily. (Patient not taking: Reported on 05/07/2022) 100 each 11   Semaglutide,0.25 or 0.'5MG'$ /DOS, (OZEMPIC, 0.25 OR 0.5 MG/DOSE,) 2 MG/1.5ML SOPN Inject 0.25 mg into the skin once a week. Start with 0.'25MG'$  once a week x 4 weeks, then increase to 0.'5MG'$  weekly. 1.5 mL 3   Vitamin D, Ergocalciferol, (DRISDOL) 1.25 MG (50000 UNIT) CAPS capsule Take 1 capsule (50,000 Units total) by mouth every 7 (seven) days. 12 capsule 0   No current facility-administered medications for this visit.    REVIEW OF SYSTEMS:   Constitutional: Denies fevers, chills or abnormal night sweats Breast: Palpable lump in the left breast All other systems were reviewed with the patient and are negative.  PHYSICAL EXAMINATION: ECOG PERFORMANCE STATUS: 1 - Symptomatic but completely ambulatory  Vitals:   06/06/22 1253  BP: (!) 155/84  Pulse: 87  Resp: 18  Temp: (!) 97.5 F (36.4 C)  SpO2: 97%   Filed Weights   06/06/22 1253  Weight: (!) 317 lb 9.6 oz (144.1 kg)    GENERAL:alert, no distress and comfortable BREAST: Clear palpable lump in the left breast. No palpable axillary or supraclavicular lymphadenopathy (exam performed in the presence of a chaperone)   LABORATORY DATA:  I have reviewed the data as listed Lab Results  Component Value Date   WBC 7.7 06/06/2022   HGB 12.8 06/06/2022   HCT 37.6 06/06/2022   MCV 78.5 (L) 06/06/2022   PLT 306 06/06/2022   Lab Results  Component Value Date   NA 137 06/06/2022   K 4.2 06/06/2022   CL 102 06/06/2022   CO2 29 06/06/2022    RADIOGRAPHIC STUDIES: I have personally reviewed the radiological reports and agreed with the findings in the report.  ASSESSMENT AND PLAN:  Malignant neoplasm of overlapping sites of left breast in female, estrogen receptor positive  ([place]) 05/29/2022:Palpable abnormalities in the left breast led to mammograms which revealed 3 masses 0.7 cm at 1 o'clock position, 3 cm at 2 o'clock position, additional nodule 8 mm total span 5 cm, 5 abnormal axillary lymph nodes biopsy of the masses and the lymph node were positive for grade 3 IDC with high-grade DCIS with LVI, ER 95%, PR 40%, Ki-67 60%, HER2 2+ by IHC FISH negative  Pathology and radiology counseling: Discussed with the patient, the details of pathology including the type of breast cancer,the clinical staging, the significance of ER, PR and HER-2/neu receptors and the implications for treatment. After reviewing the pathology in detail, we proceeded to discuss the different treatment options between surgery, radiation, chemotherapy, antiestrogen therapies.  Recommendation based on multidisciplinary tumor board: 1. Neoadjuvant chemotherapy with Adriamycin and Cytoxan dose dense 4 followed by Taxol weekly 12  2. Followed by breast conserving surgery with targeted axillary dissection 3. Followed by adjuvant radiation therapy 4.  Followed by antiestrogen therapy  Chemotherapy Counseling: I discussed the risks and benefits of chemotherapy including the risks of nausea/ vomiting, risk of infection from low WBC count, fatigue due to chemo or anemia, bruising or bleeding due to low platelets, mouth sores, loss/ change in taste and decreased appetite. Liver and kidney function will be monitored through out chemotherapy as abnormalities in liver and kidney function may be a side effect of treatment.  Peripheral neuropathy due to Taxol and cardiac dysfunction due to Adriamycin was discussed in detail. Risk of permanent bone marrow dysfunction due to chemo were also discussed.  Plan: 1. Port placement to be done next Monday 2. Echocardiogram 3. Chemotherapy class 4. Breast MRI 5. CT chest abdomen pelvis and bone scan for staging Genetic counseling will also be arranged  Patient is not  eligible for neuropathy study because she has a mild diabetic peripheral neuropathy.  Return to clinic in 2 weeks to start chemotherapy.   All questions were answered. The patient knows to call the clinic with any problems, questions or concerns.    Harriette Ohara, MD 06/06/22

## 2022-06-06 NOTE — Progress Notes (Signed)
REFERRING PROVIDER: Nicholas Lose, MD  PRIMARY PROVIDER:  Charyl Dancer, NP  PRIMARY REASON FOR VISIT:  Encounter Diagnosis  Name Primary?   Malignant neoplasm of overlapping sites of left breast in female, estrogen receptor positive ([place]) Yes   HISTORY OF PRESENT ILLNESS:   Lynn [name], a 37 y.o. female, was seen for a [place] cancer genetics consultation during the breast multidisciplinary clinic at the request of Dr. Lindi Adie due to a personal and family history of cancer.  Lynn [name] presents to clinic today to discuss the possibility of a hereditary predisposition to cancer, to discuss genetic testing, and to further clarify her future cancer risks, as well as potential cancer risks for family members.   In March 2024, at the age of 65, Lynn [name] was diagnosed with invasive ductal carcinoma of the left breast (ER/PR positive, HER2 negative). She is planning to have neoadjuvant chemotherapy followed by a lumpectomy, radiation, and anti-estrogen therapy.    CANCER HISTORY:  Oncology History  Malignant neoplasm of overlapping sites of left breast in female, estrogen receptor positive ([place])  06/04/2022 Initial Diagnosis   Malignant neoplasm of overlapping sites of left breast in female, estrogen receptor positive ([place])   06/04/2022 Cancer Staging   Staging form: Breast, AJCC 8th Edition - Clinical stage from 06/04/2022: Stage IIB (cT2, cN1(f), cM0, G3, ER+, PR+, HER2-) - Signed by Hayden Pedro, PA-C on 06/04/2022 Stage prefix: Initial diagnosis Method of lymph node assessment: Core biopsy Histologic grading system: 3 grade system     RISK FACTORS:  Menarche was at age 60.  First live birth at age 30.  Ovaries intact: yes.  Uterus intact: yes.  Menopausal status: premenopausal.  HRT use: 0 years. Colonoscopy: no Any excessive radiation exposure in the past: no  Past Medical History:  Diagnosis Date   Carpal tunnel syndrome, left    Diabetes (HCC)    Hypertension     Iron deficiency    Panic attacks    PCOS (polycystic ovarian syndrome)    Tachycardia     Past Surgical History:  Procedure Laterality Date   ADENOIDECTOMY     BREAST BIOPSY Left 05/29/2022   [place] LT BREAST BX W LOC DEV 1ST LESION IMG BX [place] US GUIDE 05/29/2022 [facility]   BREAST BIOPSY Left 05/29/2022   [place] LT BREAST BX W LOC DEV EA ADD LESION IMG BX [place] US GUIDE 05/29/2022 [facility]   BREAST BIOPSY Left 06/06/2022   MM LT BREAST BX W LOC DEV 1ST LESION IMAGE BX [place] STEREO GUIDE 06/06/2022 [facility]   CESAREAN SECTION     TONSILLECTOMY      Social History   Socioeconomic History   Marital status: Single    Spouse name: Not on file   Number of children: 1   Years of education: some college   Highest education level: Not on file  Occupational History   Not on file  Tobacco Use   Smoking status: Former    Packs/day: 1.00    Years: 7.00    Total pack years: 7.00    Types: Cigarettes, E-cigarettes    Quit date: 10/29/2021    Years since quitting: 0.6   Smokeless tobacco: Never  Vaping Use   Vaping Use: Former  Substance and Sexual Activity   Alcohol use: Yes    Comment: rarely   Drug use: No   Sexual activity: Yes    Birth control/protection: Condom  Other Topics Concern   Not on file  Social History Narrative      Social Determinants of Radio broadcast assistant Strain: Not on file  Food Insecurity: Not on file  Transportation Needs: Not on file  Physical Activity: Not on file  Stress: Not on file  Social Connections: Not on file     FAMILY HISTORY:  We obtained a detailed, 4-generation family history.  Significant diagnoses are noted below:    Lynn [name] maternal aunt has a history of colon cancer and a brain tumor (unknown if benign or malignant), both diagnosed in her 53s. Her maternal half-aunt was diagnosed with an unknown type of cancer. Lynn [name] has two paternal aunts. One was diagnosed with melanoma in her 72s and her  second aunt was diagnosed with lung cancer at age 61 (she smoked). Lynn [name] is unaware of previous family history of genetic testing for hereditary cancer risks. There is no reported Ashkenazi Jewish ancestry.   GENETIC COUNSELING ASSESSMENT: Lynn [name] is a 37 y.o. female with a personal and family history of cancer which is somewhat suggestive of a hereditary cancer syndrome and predisposition to cancer given her young age at diagnosis (<56 years old). We, therefore, discussed and recommended the following at today's visit.   DISCUSSION: We discussed that 5 - 10% of cancer is hereditary, with most cases of hereditary breast cancer associated with mutations in BRCA1/2.  There are other genes that can be associated with hereditary breast cancer syndromes. Type of cancer risk and level of risk are gene-specific. We discussed that testing is beneficial for several reasons including knowing how to follow individuals after completing their treatment, identifying whether potential treatment options would be beneficial, and understanding if other family members could be at risk for cancer and allowing them to undergo genetic testing.   We reviewed the characteristics, features and inheritance patterns of hereditary cancer syndromes. We also discussed genetic testing, including the appropriate family members to test, the process of testing, insurance coverage and turn-around-time for results. We discussed the implications of a negative, positive and/or variant of uncertain significant result.  Lynn [name]  was offered a common hereditary cancer panel (48 genes) and an expanded pan-cancer panel (70 genes). Lynn [name] was informed of the benefits and limitations of each panel, including that expanded pan-cancer panels contain genes that do not have clear management guidelines at this point in time.  We also discussed that as the number of genes included on a panel increases, the chances of variants of uncertain  significance increases.  After considering the benefits and limitations of each gene panel, Lynn [name] elected to have [employer].  The Multi-Cancer + RNA Panel offered by [facility] includes sequencing and/or deletion/duplication analysis of the following 70 genes:  AIP*, ALK, APC*, ATM*, AXIN2*, BAP1*, BARD1*, BLM*, BMPR1A*, BRCA1*, BRCA2*, BRIP1*, CDC73*, CDH1*, CDK4, CDKN1B*, CDKN2A, CHEK2*, CTNNA1*, DICER1*, EPCAM (del/dup only), EGFR, FH*, FLCN*, GREM1 (promoter dup only), HOXB13, KIT, LZTR1, MAX*, MBD4, MEN1*, MET, MITF, MLH1*, MSH2*, MSH3*, MSH6*, MUTYH*, NF1*, NF2*, NTHL1*, PALB2*, PDGFRA, PMS2*, POLD1*, POLE*, POT1*, PRKAR1A*, PTCH1*, PTEN*, RAD51C*, RAD51D*, RB1*, RET, SDHA* (sequencing only), SDHAF2*, SDHB*, SDHC*, SDHD*, SMAD4*, SMARCA4*, SMARCB1*, SMARCE1*, STK11*, SUFU*, TMEM127*, TP53*, TSC1*, TSC2*, VHL*. RNA analysis is performed for * genes.  Based on Lynn [name]'s personal and family history of cancer, she meets medical criteria for genetic testing. Despite that she meets criteria, she may still have an out of pocket cost. We discussed that if her out of pocket cost for testing is over $100, the  laboratory should contact them to discuss self-pay prices, patient pay assistance programs, if applicable, and other billing options.   PLAN: After considering the risks, benefits, and limitations, Lynn [name] provided informed consent to pursue genetic testing and the blood sample was sent to [facility] for analysis of the Multi-Cancer Panel. Results should be available within approximately 2-3 weeks' time, at which point they will be disclosed by telephone to Lynn [name], as will any additional recommendations warranted by these results. Lynn [name] will receive a summary of her genetic counseling visit and a copy of her results once available. This information will also be available in Epic.   Lynn [name] questions were answered to her satisfaction today. Our contact  information was provided should additional questions or concerns arise. Thank you for the referral and allowing [place] to share in the care of your patient.   Lucille Passy, MS, [facility] Genetic Counselor [email or web address] (P) [phone]  The patient was seen for a total of 20 minutes in face-to-face genetic counseling.  The patient brought her fiance and sister.  Drs. Lindi Adie and/or Burr Medico were available to discuss this case as needed.  _______________________________________________________________________ For Office Staff:   Number of people involved in session: 3 Was an Intern/ student involved with case: no

## 2022-06-06 NOTE — Progress Notes (Signed)
[place] Psychosocial Distress Screening Spiritual Care  Met with Lilianne, her fiance Lennette Bihari, and her sister in [facility] to introduce [facility] team/resources, reviewing distress screen per protocol.  The patient scored a 8 on the Psychosocial Distress Thermometer which indicates severe distress. Also assessed for distress and other psychosocial needs.      06/06/2022    5:18 PM  ONCBCN DISTRESS SCREENING  Screening Type Initial Screening  Distress experienced in past week (1-10) 8  Practical problem type Work/school  Family Problem type Children  Emotional problem type Depression;Nervousness/Anxiety;Adjusting to illness;Isolation/feeling alone;Feeling hopeless;Adjusting to appearance changes  Spiritual/Religous concerns type Facing my mortality  Information Concerns Type Lack of info about diagnosis;Lack of info about complementary therapy choices;Lack of info about treatment  Physical Problem type Pain;Tingling hands/feet  Referral to support programs Yes    Chaplain and patient discussed common feelings and emotions when being diagnosed with cancer, and the importance of support during treatment.  Chaplain informed patient of the support team and support services at [facility].  Chaplain provided contact information and encouraged patient to call with any questions or concerns.  Luul has been giving a lot of thought to how to support her daughter Darrick Grinder (almost 24) through learning about her diagnosis and treatment. Daionna reports very strong support from family and friends.  Follow up needed: Yes.  Plan to follow up by phone in ca two weeks, revisiting interest in an Pharmacist, hospital at that time.   [place], [place], [facility] Pager [phone] Voicemail [phone]

## 2022-06-06 NOTE — Assessment & Plan Note (Signed)
05/29/2022:Palpable abnormalities in the left breast led to mammograms which revealed 3 masses 0.7 cm at 1 o'clock position, 3 cm at 2 o'clock position, additional nodule 8 mm total span 5 cm, 5 abnormal axillary lymph nodes biopsy of the masses and the lymph node were positive for grade 3 IDC with high-grade DCIS with LVI, ER 95%, PR 40%, Ki-67 60%, HER2 2+ by IHC FISH negative  Pathology and radiology counseling: Discussed with the patient, the details of pathology including the type of breast cancer,the clinical staging, the significance of ER, PR and HER-2/neu receptors and the implications for treatment. After reviewing the pathology in detail, we proceeded to discuss the different treatment options between surgery, radiation, chemotherapy, antiestrogen therapies.  Recommendation based on multidisciplinary tumor board: 1. Neoadjuvant chemotherapy with Adriamycin and Cytoxan dose dense 4 followed by Taxol weekly 12  2. Followed by breast conserving surgery with targeted axillary dissection 3. Followed by adjuvant radiation therapy 4.  Followed by antiestrogen therapy  Chemotherapy Counseling: I discussed the risks and benefits of chemotherapy including the risks of nausea/ vomiting, risk of infection from low WBC count, fatigue due to chemo or anemia, bruising or bleeding due to low platelets, mouth sores, loss/ change in taste and decreased appetite. Liver and kidney function will be monitored through out chemotherapy as abnormalities in liver and kidney function may be a side effect of treatment.  Peripheral neuropathy due to Taxol and cardiac dysfunction due to Adriamycin was discussed in detail. Risk of permanent bone marrow dysfunction due to chemo were also discussed.  Plan: 1. Port placement to be done next Monday 2. Echocardiogram 3. Chemotherapy class 4. Breast MRI 5. CT chest abdomen pelvis and bone scan for staging Genetic counseling will also be arranged  Patient is not eligible  for neuropathy study because she has a mild diabetic peripheral neuropathy.  Return to clinic in 2 weeks to start chemotherapy.

## 2022-06-06 NOTE — Therapy (Signed)
OUTPATIENT PHYSICAL THERAPY BREAST CANCER BASELINE EVALUATION   Patient Name: Lynn [name] MRN: [id number] DOB:Jun 12, 1985, 37 y.o., female Today's Date: 06/06/2022  END OF SESSION:  PT End of Session - 06/06/22 1321     Visit Number 1    Number of Visits 2    Date for PT Re-Evaluation 12/07/22    PT Start Time [id number]    PT Stop Time [id number]    PT Time Calculation (min) 35 min    Activity Tolerance Patient tolerated treatment well    Behavior During Therapy WFL for tasks assessed/performed             Past Medical History:  Diagnosis Date   Carpal tunnel syndrome, left    Diabetes ([place])    Hypertension    Iron deficiency    Panic attacks    PCOS (polycystic ovarian syndrome)    Tachycardia    Past Surgical History:  Procedure Laterality Date   ADENOIDECTOMY     BREAST BIOPSY Left 05/29/2022   [place] LT BREAST BX W LOC DEV 1ST LESION IMG BX [place] US GUIDE 05/29/2022 [facility]   BREAST BIOPSY Left 05/29/2022   [place] LT BREAST BX W LOC DEV EA ADD LESION IMG BX [place] US GUIDE 05/29/2022 [facility]   BREAST BIOPSY Left 06/06/2022   MM LT BREAST BX W LOC DEV 1ST LESION IMAGE BX [place] STEREO GUIDE 06/06/2022 [facility]   CESAREAN SECTION     TONSILLECTOMY     Patient Active Problem List   Diagnosis Date Noted   Malignant neoplasm of overlapping sites of left breast in female, estrogen receptor positive ([place]) 06/04/2022   Syncope and collapse [id number]   Umbilical hernia without obstruction and without gangrene 03/12/2022   Vitamin D deficiency 03/12/2022   Cervical radicular pain 12/07/2021   Snoring 12/07/2021   Other fatigue 12/07/2021   Chest pain 08/15/2021   Epigastric pain 08/15/2021   Acute pain of both shoulders 04/21/2021   Nerve pain 04/21/2021   Morbid obesity ([place]) 10/13/2020   Essential hypertension 03/12/2019   Chronic tachycardia 03/12/2019   Type 2 diabetes mellitus with other specified complication ([place]) [id number]   Generalized anxiety  disorder with panic attacks 03/12/2019   Depression, major, single episode, complete remission ([place]) 03/12/2019   Family history of heart attack 03/12/2019   LVH (left ventricular hypertrophy) due to hypertensive disease, without heart failure 03/12/2019   Type 2 diabetes mellitus ([place]) 03/12/2019    REFERRING PROVIDER: Dr. Rolm Bookbinder  REFERRING DIAG: Left breast cancer   THERAPY DIAG:  Malignant neoplasm of upper-outer quadrant of left breast in female, estrogen receptor positive ([place])  Abnormal posture  Chronic left shoulder pain  Stiffness of left shoulder, not elsewhere classified  Rationale for Evaluation and Treatment: Rehabilitation  ONSET DATE: 05/21/2022  SUBJECTIVE:  SUBJECTIVE STATEMENT: Patient reports she is here today to be seen by her medical team for her newly diagnosed left breast cancer.   PERTINENT HISTORY:  Patient was diagnosed on 05/21/2022 with left grade 3 invasive ductal carcinoma breast cancer. It measures 3 cm and 7 mm (5 cm total) and is located in the upper outer quadrant. It is ER/PR positive and HER2 negative with a Ki67 of 60%. She has 5 abnormal appearing axillary lymph nodes and 1 was biopsied and is positive. She has a BMI > 50, hypertension, and diabetes. Left rotator cuff tear; needs surgery but is now postponed.  PATIENT GOALS:   reduce lymphedema risk and learn post op HEP.   PAIN:  Are you having pain? Yes: NPRS scale: 4-5/10 Pain location: Left shoulder Pain description: Sharp Aggravating factors: Overuse Relieving factors: Massage gun  PRECAUTIONS: Active CA Other: Left shoulder rotator cuff tear  HAND DOMINANCE: right  WEIGHT BEARING RESTRICTIONS: No  FALLS:  Has patient fallen in last 6 months? Yes. Number of falls 1 - Fainted in 01/2022  due to over-exertion at work  LIVING ENVIRONMENT: Patient lives with: her fiancee and 43 year old daughter Lives in: House/apartment Has following equipment at home: None  OCCUPATION: Works as Sales promotion account executive at [employer]: She does not exercise  PRIOR LEVEL OF FUNCTION: Independent   OBJECTIVE:  COGNITION: Overall cognitive status: Within functional limits for tasks assessed    POSTURE:  Forward head and rounded shoulders posture  UPPER EXTREMITY AROM/PROM:  A/PROM RIGHT   eval   Shoulder extension 50  Shoulder flexion 149  Shoulder abduction 135  Shoulder internal rotation 72  Shoulder external rotation 89    (Blank rows = not tested)  A/PROM LEFT   eval  Shoulder extension 48  Shoulder flexion 141 and painful/tight  Shoulder abduction 156  Shoulder internal rotation 59  Shoulder external rotation 78    (Blank rows = not tested)  CERVICAL AROM: All within normal limits  UPPER EXTREMITY STRENGTH: Not tested due to left rotator cuff tear  LYMPHEDEMA ASSESSMENTS:   LANDMARK RIGHT   eval  10 cm proximal to olecranon process 43.8  Olecranon process 33.5  10 cm proximal to ulnar styloid process 29.4  Just proximal to ulnar styloid process 20.3  Across hand at thumb web space 23.2  At base of 2nd digit 7.8  (Blank rows = not tested)  LANDMARK LEFT   eval  10 cm proximal to olecranon process 41.3  Olecranon process 32.4  10 cm proximal to ulnar styloid process 28  Just proximal to ulnar styloid process 20.8  Across hand at thumb web space 22.7  At base of 2nd digit 7.6  (Blank rows = not tested)  L-DEX LYMPHEDEMA SCREENING:  The patient was assessed using the L-Dex machine today to produce a lymphedema index baseline score. The patient will be reassessed on a regular basis (typically every 3 months) to obtain new L-Dex scores. If the score is > 6.5 points away from his/her baseline score indicating onset of subclinical lymphedema, it  will be recommended to wear a compression garment for 4 weeks, 12 hours per day and then be reassessed. If the score continues to be > 6.5 points from baseline at reassessment, we will initiate lymphedema treatment. Assessing in this manner has a 95% rate of preventing clinically significant lymphedema.   L-DEX FLOWSHEETS - 06/06/22 1300       L-DEX LYMPHEDEMA SCREENING   Measurement Type Unilateral  L-DEX MEASUREMENT EXTREMITY Upper Extremity    POSITION  Standing    DOMINANT SIDE Right    At Risk Side Left    BASELINE SCORE (UNILATERAL) 1.8             QUICK DASH SURVEY:  Katina Dung - 06/06/22 0001     Open a tight or new jar Mild difficulty    Do heavy household chores (wash walls, wash floors) Mild difficulty    Carry a shopping bag or briefcase Mild difficulty    Wash your back Mild difficulty    Use a knife to cut food No difficulty    Recreational activities in which you take some force or impact through your arm, shoulder, or hand (golf, hammering, tennis) Mild difficulty    During the past week, to what extent has your arm, shoulder or hand problem interfered with your normal social activities with family, friends, neighbors, or groups? Slightly    During the past week, to what extent has your arm, shoulder or hand problem limited your work or other regular daily activities Slightly    Arm, shoulder, or hand pain. Moderate    Tingling (pins and needles) in your arm, shoulder, or hand Moderate    Difficulty Sleeping No difficulty    DASH Score 25 %              PATIENT EDUCATION:  Education details: Lymphedema risk reduction and post op shoulder/posture HEP Person educated: Patient Education method: Explanation, Demonstration, Handout Education comprehension: Patient verbalized understanding and returned demonstration HOME EXERCISE PROGRAM: Patient was instructed today in a home exercise program today for post op shoulder range of motion. These included  active assist shoulder flexion in sitting, scapular retraction, wall walking with shoulder abduction, and hands behind head external rotation.  She was encouraged to do these twice a day, holding 3 seconds and repeating 5 times when permitted by her physician.   ASSESSMENT:  CLINICAL IMPRESSION: Patient was diagnosed on 05/21/2022 with left grade 3 invasive ductal carcinoma breast cancer. It measures 3 cm and 7 mm (5 cm total) and is located in the upper outer quadrant. It is ER/PR positive and HER2 negative with a Ki67 of 60%. She has 5 abnormal appearing axillary lymph nodes and 1 was biopsied and is positive. She has a BMI > 50, hypertension, and diabetes. Her multidisciplinary medical team met prior to her assessments to determine a recommended treatment plan. She is planning to have neoadjuvant chemotherapy followed by a left lumpectomy and either targeted node dissection or axillary node dissection, radiation, and anti-estrogen therapy. She will benefit from a post op PT reassessment to determine needs and from L-Dex screens every 3 months for 2 years to detect subclinical lymphedema.  Pt will benefit from skilled therapeutic intervention to improve on the following deficits: Decreased knowledge of precautions, impaired UE functional use, pain, decreased ROM, postural dysfunction.   PT treatment/interventions: ADL/self-care home management, pt/family education, therapeutic exercise  REHAB POTENTIAL: Good  CLINICAL DECISION MAKING: Stable/uncomplicated  EVALUATION COMPLEXITY: Low   GOALS: Goals reviewed with patient? YES  LONG TERM GOALS: (STG=LTG)    Name Target Date Goal status  1 Pt will be able to verbalize understanding of pertinent lymphedema risk reduction practices relevant to her dx specifically related to skin care.  Baseline:  No knowledge 06/06/2022 Achieved at eval  2 Pt will be able to return demo and/or verbalize understanding of the post op HEP related to regaining  shoulder ROM. Baseline:  No knowledge 06/06/2022 Achieved at eval  3 Pt will be able to verbalize understanding of the importance of attending the post op After Breast CA Class for further lymphedema risk reduction education and therapeutic exercise.  Baseline:  No knowledge 06/06/2022 Achieved at eval  4 Pt will demo she has regained full shoulder ROM and function post operatively compared to baselines.  Baseline: See objective measurements taken today. 12/07/2022     PLAN:  PT FREQUENCY/DURATION: EVAL and 1 follow up appointment.   PLAN FOR NEXT SESSION: will reassess 3-4 weeks post op to determine needs.   Patient will follow up at outpatient cancer rehab 3-4 weeks following surgery.  If the patient requires physical therapy at that time, a specific plan will be dictated and sent to the referring physician for approval. The patient was educated today on appropriate basic range of motion exercises to begin post operatively and the importance of attending the After Breast Cancer class following surgery.  Patient was educated today on lymphedema risk reduction practices as it pertains to recommendations that will benefit the patient immediately following surgery.  She verbalized good understanding.    Physical Therapy Information for After Breast Cancer Surgery/Treatment:  Lymphedema is a swelling condition that you may be at risk for in your arm if you have lymph nodes removed from the armpit area.  After a sentinel node biopsy, the risk is approximately 5-9% and is higher after an axillary node dissection.  There is treatment available for this condition and it is not life-threatening.  Contact your physician or physical therapist with concerns. You may begin the 4 shoulder/posture exercises (see additional sheet) when permitted by your physician (typically a week after surgery).  If you have drains, you may need to wait until those are removed before beginning range of motion exercises.  A general  recommendation is to not lift your arms above shoulder height until drains are removed.  These exercises should be done to your tolerance and gently.  This is not a "no pain/no gain" type of recovery so listen to your body and stretch into the range of motion that you can tolerate, stopping if you have pain.  If you are having immediate reconstruction, ask your plastic surgeon about doing exercises as he or she may want you to wait. We encourage you to attend the free one time ABC (After Breast Cancer) class offered by [facility].  You will learn information related to lymphedema risk, prevention and treatment and additional exercises to regain mobility following surgery.  You can call [phone] for more information.  This is offered the 1st and 3rd Monday of each month.  You only attend the class one time. While undergoing any medical procedure or treatment, try to avoid blood pressure being taken or needle sticks from occurring on the arm on the side of cancer.   This recommendation begins after surgery and continues for the rest of your life.  This may help reduce your risk of getting lymphedema (swelling in your arm). An excellent resource for those seeking information on lymphedema is the National Lymphedema Network's web site. It can be accessed at [place].org If you notice swelling in your hand, arm or breast at any time following surgery (even if it is many years from now), please contact your doctor or physical therapist to discuss this.  Lymphedema can be treated at any time but it is easier for you if it is treated early on.  If you feel  like your shoulder motion is not returning to normal in a reasonable amount of time, please contact your surgeon or physical therapist.  [facility] [phone]. [address], [address], [place] [address]  ABC CLASS After Breast Cancer Class  After Breast Cancer Class is a specially designed  exercise class to assist you in a safe recover after having breast cancer surgery.  In this class you will learn how to get back to full function whether your drains were just removed or if you had surgery a month ago.  This one-time class is held the 1st and 3rd Monday of every month from 11:00 a.m. until 12:00 noon virtually.  This class is FREE and space is limited. For more information or to register for the next available class, call [phone].  Class Goals  Understand specific stretches to improve the flexibility of you chest and shoulder. Learn ways to safely strengthen your upper body and improve your posture. Understand the warning signs of infection and why you may be at risk for an arm infection. Learn about Lymphedema and prevention.  ** You do not attend this class until after surgery.  Drains must be removed to participate  Patient was instructed today in a home exercise program today for post op shoulder range of motion. These included active assist shoulder flexion in sitting, scapular retraction, wall walking with shoulder abduction, and hands behind head external rotation.  She was encouraged to do these twice a day, holding 3 seconds and repeating 5 times when permitted by her physician.  Annia Friendly, [place] 06/06/22 2:21 PM

## 2022-06-07 NOTE — Research (Signed)
Trial:  [id number] Patient Lynn [name] was identified by Dr Lindi Adie as a potential candidate for the above listed study.  This Clinical Research Nurse met with Lynn [name], [id number], on 06/07/22 in a manner and location that ensures patient privacy to discuss participation in the above listed research study.  Patient is Accompanied by her sister and husband .  A copy of the informed consent document and separate HIPAA Authorization was provided to the patient.  Patient reads, speaks, and understands [place].   Patient was provided with the business card of this Nurse and encouraged to contact the research team with any questions.  Approximately 15 minutes were spent with the patient reviewing the informed consent documents.  Patient was provided the option of taking informed consent documents home to review and was encouraged to review at their convenience with their support network, including other care providers. Patient took the consent documents home to review. Plan made for follow up by phone on Monday 06/11/22. Lynn [name] Lynn Durrett, RN, BSN, [facility] She  Her  [facility]ers Clinical Research Nurse [facility] Direct Dial [phone]  Pager [phone] 06/07/2022 8:54 AM

## 2022-06-11 ENCOUNTER — Encounter: Payer: Self-pay | Admitting: Nurse Practitioner

## 2022-06-11 ENCOUNTER — Ambulatory Visit (INDEPENDENT_AMBULATORY_CARE_PROVIDER_SITE_OTHER): Payer: Managed Care, Other (non HMO) | Admitting: Nurse Practitioner

## 2022-06-11 ENCOUNTER — Telehealth: Payer: Self-pay

## 2022-06-11 VITALS — BP 138/84 | HR 88 | Temp 98.3°F | Ht 67.0 in | Wt 320.0 lb

## 2022-06-11 DIAGNOSIS — I10 Essential (primary) hypertension: Secondary | ICD-10-CM

## 2022-06-11 DIAGNOSIS — E119 Type 2 diabetes mellitus without complications: Secondary | ICD-10-CM

## 2022-06-11 DIAGNOSIS — C50812 Malignant neoplasm of overlapping sites of left female breast: Secondary | ICD-10-CM | POA: Diagnosis not present

## 2022-06-11 DIAGNOSIS — F419 Anxiety disorder, unspecified: Secondary | ICD-10-CM | POA: Diagnosis not present

## 2022-06-11 DIAGNOSIS — R000 Tachycardia, unspecified: Secondary | ICD-10-CM | POA: Diagnosis not present

## 2022-06-11 DIAGNOSIS — F32A Depression, unspecified: Secondary | ICD-10-CM | POA: Diagnosis not present

## 2022-06-11 DIAGNOSIS — Z170 Estrogen receptor positive status [ER+]: Secondary | ICD-10-CM

## 2022-06-11 LAB — POCT GLYCOSYLATED HEMOGLOBIN (HGB A1C)
HbA1c POC (<> result, manual entry): 5.7 % (ref 4.0–5.6)
HbA1c, POC (controlled diabetic range): 5.7 % (ref 0.0–7.0)
HbA1c, POC (prediabetic range): 5.7 % (ref 5.7–6.4)
Hemoglobin A1C: 5.7 % — AB (ref 4.0–5.6)

## 2022-06-11 MED ORDER — CARVEDILOL 25 MG PO TABS: 25.0000 mg | ORAL_TABLET | Freq: Two times a day (BID) | ORAL | 1 refills | Status: DC

## 2022-06-11 MED ORDER — SERTRALINE HCL 25 MG PO TABS: 25.0000 mg | ORAL_TABLET | Freq: Every day | ORAL | 1 refills | Status: DC

## 2022-06-11 NOTE — Assessment & Plan Note (Signed)
Chronic, stable.  BP today 138/84.  Continue carvedilol 25 mg twice daily.  Refill sent to the pharmacy.

## 2022-06-11 NOTE — Assessment & Plan Note (Signed)
Chronic, stable.  A1c today is 5.7%.  Continue Ozempic 0.25 mg injection for 1 more week then increase to 0.5 mg injection weekly.

## 2022-06-11 NOTE — Assessment & Plan Note (Signed)
Chronic, not controlled.  She has been having more anxiety since her recent breast cancer diagnosis.  She did not tolerate Cymbalta and fluoxetine in the past, it made her feel like she was in a fog.  Her PHQ-9 is an 8 and her GAD-7 is a 19.  She denies SI/HI.  Will have her start sertraline 25 mg daily.  Continue hydroxyzine 25 mg as needed.  Follow-up in 4 to 6 weeks.

## 2022-06-11 NOTE — Telephone Encounter (Addendum)
Title: [id number] Specimen collection Study:  Spoke with Mrs Risse. Plan made for consent and blood collection after her Chemo Ed class on 3/20. If her appointments are changed before then, we will adjust accordingly.  Marjie Skiff Cheikh Bramble, RN, BSN, [facility] She  Her  Hers Clinical Research Nurse [place] [phone]  Pager [phone] 06/11/2022 4:23 PM

## 2022-06-11 NOTE — Patient Instructions (Signed)
It was great to see you!  I have refilled your medications. Your sugars are well controlled!  Start sertraline (zoloft) 1 tablet daily for anxiety. You can keep taking hydroxyzine as needed.   You can alternate between heat and ice on your breast, if it gets larger, reach out to the cancer center/mammogram location.   Let's follow-up in 4-6 weeks, sooner if you have concerns.  If a referral was placed today, you will be contacted for an appointment. Please note that routine referrals can sometimes take up to 3-4 weeks to process. Please call our office if you haven't heard anything after this time frame.  Take care,  Vance Peper, NP

## 2022-06-11 NOTE — Progress Notes (Signed)
Established Patient Office Visit  Subjective   Patient ID: Lynn [name], female    DOB: [birth year]  Age: 37 y.o. MRN: [id number]  Chief Complaint  Patient presents with   Breast Problem    Left breast pain-recently had a biopsy, medication refills    HPI  Lynn [name] is here to follow-up on recent breast cancer diagnosis and diabetes.  She states that she has met with her oncologist and has a plan going forward.  She has plans for bone scan, CT, port insertion, lumpectomy, port insertion, and chemo for 6 months.  She states that the biopsy site from 5 days ago started hurting more after she moved a certain way yesterday.  She states that she started having a little bit of swelling underneath the skin.  She has been using ice throughout the day.  There is also bruising.  She states that her diabetes is doing well.  She is taking Ozempic 0.25 mg injection weekly and next week we will go up to the 0.5 mg injection.  There was a backorder of the Ozempic and she had to restart at the low-dose.  She is not having any side effects of this medication.  She is not having any chest pain, shortness of breath.  She states that her anxiety has increased recently.  She states the fluoxetine and Cymbalta made her feel like she was in a fog.  She is currently taking hydroxyzine as needed which works, but makes her sleepy afterwards.  She is interested in starting a different maintenance medication.  She denies SI/HI.     06/11/2022    8:33 AM 03/12/2022    8:25 AM 12/07/2021   12:15 PM 08/15/2021    2:33 PM 08/09/2021    8:55 AM  Depression screen PHQ 2/9  Decreased Interest 1 0 1 0 0  Down, Depressed, Hopeless 1 0 1 0 1  PHQ - 2 Score 2 0 2 0 1  Altered sleeping '1  3  1  '$ Tired, decreased energy '1  1  1  '$ Change in appetite 0  2  2  Feeling bad or failure about yourself  2  0  1  Trouble concentrating 2  0  0  Moving slowly or fidgety/restless 0  0  0  Suicidal thoughts 0  0  0  PHQ-9 Score '8   8  6  '$ Difficult doing work/chores Somewhat difficult  Somewhat difficult  Somewhat difficult      06/11/2022    8:33 AM 10/13/2020   11:38 AM  GAD 7 : Generalized Anxiety Score  Nervous, Anxious, on Edge 3 2  Control/stop worrying 3 1  Worry too much - different things 3 2  Trouble relaxing 3 2  Restless 2 1  Easily annoyed or irritable 2 2  Afraid - awful might happen 3 2  Total GAD 7 Score 19 12  Anxiety Difficulty Somewhat difficult Somewhat difficult      ROS See pertinent positives and negatives per HPI.    Objective:     BP 138/84 (BP Location: Right Wrist)   Pulse 88   Temp 98.3 F (36.8 C)   Ht '5\' 7"'$  (1.702 m)   Wt (!) 320 lb (145.2 kg)   SpO2 97%   BMI 50.12 kg/m    Physical Exam Vitals and nursing note reviewed.  Constitutional:      General: She is not in acute distress.    Appearance: Normal appearance.  HENT:  Head: Normocephalic.  Eyes:     Conjunctiva/sclera: Conjunctivae normal.  Cardiovascular:     Rate and Rhythm: Normal rate and regular rhythm.     Pulses: Normal pulses.     Heart sounds: Normal heart sounds.  Pulmonary:     Effort: Pulmonary effort is normal.     Breath sounds: Normal breath sounds.  Chest:  Breasts:    Left: Mass, skin change and tenderness present.     Comments: Bruising to biopsy site Musculoskeletal:     Cervical back: Normal range of motion.  Skin:    General: Skin is warm.  Neurological:     General: No focal deficit present.     Mental Status: She is alert and oriented to person, place, and time.  Psychiatric:        Mood and Affect: Mood normal.        Behavior: Behavior normal.        Thought Content: Thought content normal.        Judgment: Judgment normal.      Results for orders placed or performed in visit on 06/11/22  POCT glycosylated hemoglobin (Hb A1C)  Result Value Ref Range   Hemoglobin A1C 5.7 (A) 4.0 - 5.6 %   HbA1c POC (<> result, manual entry) 5.7 4.0 - 5.6 %   HbA1c, POC  (prediabetic range) 5.7 5.7 - 6.4 %   HbA1c, POC (controlled diabetic range) 5.7 0.0 - 7.0 %      The ASCVD Risk score (Arnett DK, et al., 2019) failed to calculate for the following reasons:   The 2019 ASCVD risk score is only valid for ages 72 to 48    Assessment & Plan:   Problem List Items Addressed This Visit       Cardiovascular and Mediastinum   Essential hypertension    Chronic, stable.  BP today 138/84.  Continue carvedilol 25 mg twice daily.  Refill sent to the pharmacy.      Relevant Medications   carvedilol (COREG) 25 MG tablet     Endocrine   Type 2 diabetes mellitus (HCC) - Primary    Chronic, stable.  A1c today is 5.7%.  Continue Ozempic 0.25 mg injection for 1 more week then increase to 0.5 mg injection weekly.      Relevant Orders   POCT glycosylated hemoglobin (Hb A1C) (Completed)     Other   Chronic tachycardia    Chronic, stable.  Continue carvedilol 25 mg twice daily.  Refill sent to the pharmacy.      Relevant Medications   carvedilol (COREG) 25 MG tablet   Morbid obesity (HCC)   Malignant neoplasm of overlapping sites of left breast in female, estrogen receptor positive ([place])    She is currently following with oncology and has plans for surgery, chemo, radiation.  There is a small lump under her biopsy site, may be a small hematoma.  She can alternate ice and heat.  Follow-up if this gets larger or more painful.      Anxiety and depression    Chronic, not controlled.  She has been having more anxiety since her recent breast cancer diagnosis.  She did not tolerate Cymbalta and fluoxetine in the past, it made her feel like she was in a fog.  Her PHQ-9 is an 8 and her GAD-7 is a 19.  She denies SI/HI.  Will have her start sertraline 25 mg daily.  Continue hydroxyzine 25 mg as needed.  Follow-up in 4 to 6  weeks.      Relevant Medications   hydrOXYzine (ATARAX) 25 MG tablet   sertraline (ZOLOFT) 25 MG tablet    Return in about 4 weeks (around  07/09/2022) for Anxiety, can be virtual.    Charyl Dancer, NP

## 2022-06-11 NOTE — Progress Notes (Signed)
Received fax regarding FMLA forms to be completed. Placed forms in new FMLA folder and notified [place] and [place].

## 2022-06-11 NOTE — Assessment & Plan Note (Signed)
She is currently following with oncology and has plans for surgery, chemo, radiation.  There is a small lump under her biopsy site, may be a small hematoma.  She can alternate ice and heat.  Follow-up if this gets larger or more painful.

## 2022-06-11 NOTE — Assessment & Plan Note (Signed)
Chronic, stable.  Continue carvedilol 25 mg twice daily.  Refill sent to the pharmacy.

## 2022-06-12 ENCOUNTER — Other Ambulatory Visit: Payer: Self-pay | Admitting: Hematology and Oncology

## 2022-06-12 ENCOUNTER — Telehealth: Payer: Self-pay | Admitting: Nurse Practitioner

## 2022-06-12 DIAGNOSIS — Z170 Estrogen receptor positive status [ER+]: Secondary | ICD-10-CM

## 2022-06-12 MED ORDER — DEXAMETHASONE 4 MG PO TABS: ORAL_TABLET | ORAL | 0 refills | Status: DC

## 2022-06-12 MED ORDER — ONDANSETRON HCL 8 MG PO TABS: ORAL_TABLET | ORAL | 1 refills | Status: DC

## 2022-06-12 MED ORDER — LIDOCAINE-PRILOCAINE 2.5-2.5 % EX CREA: TOPICAL_CREAM | CUTANEOUS | 3 refills | Status: DC

## 2022-06-12 MED ORDER — PROCHLORPERAZINE MALEATE 10 MG PO TABS: 10.0000 mg | ORAL_TABLET | Freq: Four times a day (QID) | ORAL | 1 refills | Status: DC | PRN

## 2022-06-12 NOTE — Telephone Encounter (Signed)
Per 3/12 IB reached out to schedule patient, left voicemail for patient to call back.

## 2022-06-12 NOTE — Progress Notes (Signed)
START ON PATHWAY REGIMEN - Breast     Cycles 1 through 4: A cycle is every 14 days:     Doxorubicin      Cyclophosphamide      Pegfilgrastim-xxxx    Cycles 5 through 16: A cycle is every 7 days:     Paclitaxel   **Always confirm dose/schedule in your pharmacy ordering system**  Patient Characteristics: Preoperative or Nonsurgical Candidate (Clinical Staging), Neoadjuvant Therapy followed by Surgery, Invasive Disease, Chemotherapy, HER2 Negative, ER Positive Therapeutic Status: Preoperative or Nonsurgical Candidate (Clinical Staging) AJCC M Category: cM0 AJCC Grade: G3 Breast Surgical Plan: Neoadjuvant Therapy followed by Surgery ER Status: Positive (+) AJCC 8 Stage Grouping: IIB HER2 Status: Negative (-) AJCC T Category: cT2 AJCC N Category: cN1 PR Status: Positive (+) Intent of Therapy: Curative Intent, Discussed with Patient 

## 2022-06-13 ENCOUNTER — Telehealth: Payer: Self-pay | Admitting: Nurse Practitioner

## 2022-06-13 ENCOUNTER — Telehealth: Payer: Self-pay | Admitting: Hematology and Oncology

## 2022-06-13 MED ORDER — ALPRAZOLAM 0.25 MG PO TABS: ORAL_TABLET | ORAL | 0 refills | Status: DC

## 2022-06-13 NOTE — Telephone Encounter (Signed)
Scheduled appointments per WQ. Patient is aware of all made appointments. 

## 2022-06-13 NOTE — Telephone Encounter (Signed)
Patient notified that Rx sent in and the instructions.

## 2022-06-13 NOTE — Telephone Encounter (Signed)
Pt would like something for anxiety before her MRI on Friday. [phone]

## 2022-06-13 NOTE — Telephone Encounter (Signed)
I spoke with patient and notified her of message. She said that she has reached out to oncologist and care manage and they refer her back to PCP to get Rx.

## 2022-06-14 ENCOUNTER — Encounter (HOSPITAL_COMMUNITY)
Admission: RE | Admit: 2022-06-14 | Discharge: 2022-06-14 | Disposition: A | Discharge status: Home / Self Care | Payer: Managed Care, Other (non HMO) | Source: Ambulatory Visit | Attending: Hematology and Oncology | Admitting: Hematology and Oncology

## 2022-06-14 ENCOUNTER — Inpatient Hospital Stay: Payer: Managed Care, Other (non HMO)

## 2022-06-14 DIAGNOSIS — C50812 Malignant neoplasm of overlapping sites of left female breast: Secondary | ICD-10-CM | POA: Insufficient documentation

## 2022-06-14 DIAGNOSIS — Z170 Estrogen receptor positive status [ER+]: Secondary | ICD-10-CM | POA: Insufficient documentation

## 2022-06-14 MED ORDER — TECHNETIUM TC 99M MEDRONATE IV KIT
20.0000 | PACK | Freq: Once | INTRAVENOUS | Status: AC | PRN
  Administered 2022-06-14: 20 via INTRAVENOUS

## 2022-06-14 NOTE — Pre-Procedure Instructions (Signed)
Surgical Instructions    Your procedure is scheduled on Tuesday, March 19.  Report to [facility] Main Entrance "A" at 12:45 A.M., then check in with the Admitting office.  Call this number if you have problems the morning of surgery:  [phone]   If you have any questions prior to your surgery date call [phone]: Open Monday-Friday 8am-4pm If you experience any cold or flu symptoms such as cough, fever, chills, shortness of breath, etc. between now and your scheduled surgery, please notify us at the above number     Remember:  Do not eat after midnight the night before your surgery  You may drink clear liquids until 11:45 AM the morning of your surgery.   Clear liquids allowed are: Water, Non-Citrus Juices (without pulp), Carbonated Beverages, Clear Tea, Black Coffee ONLY (NO MILK, CREAM OR POWDERED CREAMER of any kind), and Gatorade    Take these medicines the morning of surgery with A SIP OF WATER:  carvedilol (COREG)  famotidine (PEPCID)  sertraline (ZOLOFT)   IF Needed: acetaminophen (TYLENOL)  cyclobenzaprine (FLEXERIL)  hydrOXYzine (ATARAX)  ondansetron (ZOFRAN)  prochlorperazine (COMPAZINE)   Follow your surgeon's instructions on when to stop Aspirin.  If no instructions were given by your surgeon then you will need to call the office to get those instructions.    As of today, STOP taking any aspirin-acetaminophen-caffeine (EXCEDRIN MIGRAINE), Aleve, Naproxen, Ibuprofen, Motrin, Advil, Goody's, BC's, all herbal medications, fish oil, and all vitamins.  WHAT DO I DO ABOUT MY DIABETES MEDICATION?   Do not take oral diabetes medicines (pills) the morning of surgery.   The day of surgery, do not take other diabetes injectables, including Byetta (exenatide), Bydureon (exenatide ER), Victoza (liraglutide), or Trulicity (dulaglutide).   DO NOT TAKE Semaglutide (Ozempic) for 7 days prior to surgery. Do not take a dose after 06/11/22.  If your CBG is greater than 220  mg/dL, you may take  of your sliding scale (correction) dose of insulin.   HOW TO MANAGE YOUR DIABETES BEFORE AND AFTER SURGERY  Why is it important to control my blood sugar before and after surgery? Improving blood sugar levels before and after surgery helps healing and can limit problems. A way of improving blood sugar control is eating a healthy diet by:  Eating less sugar and carbohydrates  Increasing activity/exercise  Talking with your doctor about reaching your blood sugar goals High blood sugars (greater than 180 mg/dL) can raise your risk of infections and slow your recovery, so you will need to focus on controlling your diabetes during the weeks before surgery. Make sure that the doctor who takes care of your diabetes knows about your planned surgery including the date and location.  How do I manage my blood sugar before surgery? Check your blood sugar at least 4 times a day, starting 2 days before surgery, to make sure that the level is not too high or low.  Check your blood sugar the morning of your surgery when you wake up and every 2 hours until you get to the Short Stay unit.  If your blood sugar is less than 70 mg/dL, you will need to treat for low blood sugar: Do not take insulin. Treat a low blood sugar (less than 70 mg/dL) with  cup of clear juice (cranberry or apple), 4 glucose tablets, OR glucose gel. Recheck blood sugar in 15 minutes after treatment (to make sure it is greater than 70 mg/dL). If your blood sugar is not greater than 70  mg/dL on recheck, call [phone] for further instructions. Report your blood sugar to the short stay nurse when you get to Short Stay.  If you are admitted to the hospital after surgery: Your blood sugar will be checked by the staff and you will probably be given insulin after surgery (instead of oral diabetes medicines) to make sure you have good blood sugar levels. The goal for blood sugar control after surgery is 80-180  mg/dL.            [facility] is not responsible for any belongings or valuables.    Do NOT Smoke (Tobacco/Vaping)  24 hours prior to your procedure  If you use a CPAP at night, you may bring your mask for your overnight stay.   Contacts, glasses, hearing aids, dentures or partials may not be worn into surgery, please bring cases for these belongings   For patients admitted to the hospital, discharge time will be determined by your treatment team.   Patients discharged the day of surgery will not be allowed to drive home, and someone needs to stay with them for 24 hours.   SURGICAL WAITING ROOM VISITATION Patients having surgery or a procedure may have no more than 2 support people in the waiting area - these visitors may rotate.   Children under the age of 14 must have an adult with them who is not the patient. If the patient needs to stay at the hospital during part of their recovery, the visitor guidelines for inpatient rooms apply. Pre-op nurse will coordinate an appropriate time for 1 support person to accompany patient in pre-op.  This support person may not rotate.   Please refer to [email or web address] for the visitor guidelines for Inpatients (after your surgery is over and you are in a regular room).    Special instructions:    Oral Hygiene is also important to reduce your risk of infection.  Remember - BRUSH YOUR TEETH THE MORNING OF SURGERY WITH YOUR REGULAR TOOTHPASTE   [facility]- Preparing For Surgery  Before surgery, you can play an important role. Because skin is not sterile, your skin needs to be as free of germs as possible. You can reduce the number of germs on your skin by washing with CHG (chlorahexidine gluconate) Soap before surgery.  CHG is an antiseptic cleaner which kills germs and bonds with the skin to continue killing germs even after washing.     Please do not use if you have an allergy to CHG or  antibacterial soaps. If your skin becomes reddened/irritated stop using the CHG.  Do not shave (including legs and underarms) for at least 48 hours prior to first CHG shower. It is OK to shave your face.  Please follow these instructions carefully.     Shower the NIGHT BEFORE SURGERY and the MORNING OF SURGERY with CHG Soap.   If you chose to wash your hair, wash your hair first as usual with your normal shampoo. After you shampoo, rinse your hair and body thoroughly to remove the shampoo.  Then [employer] and genitals (private parts) with your normal soap and rinse thoroughly to remove soap.  After that Use CHG Soap as you would any other liquid soap. You can apply CHG directly to the skin and wash gently with a scrungie or a clean washcloth.   Apply the CHG Soap to your body ONLY FROM THE NECK DOWN.  Do not use on open wounds or open sores. Avoid contact with your eyes, ears,  mouth and genitals (private parts). Wash Face and genitals (private parts)  with your normal soap.   Wash thoroughly, paying special attention to the area where your surgery will be performed.  Thoroughly rinse your body with warm water from the neck down.  DO NOT shower/wash with your normal soap after using and rinsing off the CHG Soap.  Pat yourself dry with a CLEAN TOWEL.  Wear CLEAN PAJAMAS to bed the night before surgery  Place CLEAN SHEETS on your bed the night before your surgery  DO NOT SLEEP WITH PETS.   Day of Surgery:  Take a shower with CHG soap. Wear Clean/Comfortable clothing the morning of surgery Do not wear jewelry or makeup. Do not wear lotions, powders, perfumes/cologne or deodorant. Do not shave 48 hours prior to surgery.  Men may shave face and neck. Do not bring valuables to the hospital. Do not wear nail polish, gel polish, artificial nails, or any other type of covering on natural nails (fingers and toes) If you have artificial nails or gel coating that need to be removed by a nail  salon, please have this removed prior to surgery. Artificial nails or gel coating may interfere with anesthesia's ability to adequately monitor your vital signs. Remember to brush your teeth WITH YOUR REGULAR TOOTHPASTE.    If you received a COVID test during your pre-op visit, it is requested that you wear a mask when out in public, stay away from anyone that may not be feeling well, and notify your surgeon if you develop symptoms. If you have been in contact with anyone that has tested positive in the last 10 days, please notify your surgeon.    Please read over the following fact sheets that you were given.

## 2022-06-14 NOTE — Progress Notes (Signed)
Pharmacist Chemotherapy Monitoring - Initial Assessment    Anticipated start date: 06/20/22   The following has been reviewed per standard work regarding the patient's treatment regimen: The patient's diagnosis, treatment plan and drug doses, and organ/hematologic function Lab orders and baseline tests specific to treatment regimen  The treatment plan start date, drug sequencing, and pre-medications Prior authorization status  Patient's documented medication list, including drug-drug interaction screen and prescriptions for anti-emetics and supportive care specific to the treatment regimen The drug concentrations, fluid compatibility, administration routes, and timing of the medications to be used The patient's access for treatment and lifetime cumulative dose history, if applicable  The patient's medication allergies and previous infusion related reactions, if applicable   Changes made to treatment plan:  N/A  Follow up needed:  FU ECHO results scheduled on 3/20 and PORT placement scheduled for 3/19   Acquanetta Belling, [facility], BCPS, BCOP 06/14/2022  2:41 PM

## 2022-06-15 ENCOUNTER — Telehealth: Payer: Self-pay | Admitting: Genetic Counselor

## 2022-06-15 ENCOUNTER — Encounter: Payer: Self-pay | Admitting: *Deleted

## 2022-06-15 ENCOUNTER — Other Ambulatory Visit: Payer: Self-pay

## 2022-06-15 ENCOUNTER — Telehealth: Payer: Self-pay | Admitting: *Deleted

## 2022-06-15 ENCOUNTER — Encounter (HOSPITAL_COMMUNITY)
Admission: RE | Admit: 2022-06-15 | Discharge: 2022-06-15 | Disposition: A | Discharge status: Home / Self Care | Payer: Managed Care, Other (non HMO) | Source: Ambulatory Visit | Attending: General Surgery | Admitting: General Surgery

## 2022-06-15 ENCOUNTER — Ambulatory Visit
Admission: RE | Admit: 2022-06-15 | Discharge: 2022-06-15 | Disposition: A | Discharge status: Home / Self Care | Payer: Managed Care, Other (non HMO) | Source: Ambulatory Visit | Attending: Hematology and Oncology

## 2022-06-15 ENCOUNTER — Telehealth: Payer: Self-pay | Admitting: Hematology and Oncology

## 2022-06-15 ENCOUNTER — Encounter (HOSPITAL_COMMUNITY): Payer: Self-pay

## 2022-06-15 ENCOUNTER — Encounter: Payer: Self-pay | Admitting: Genetic Counselor

## 2022-06-15 DIAGNOSIS — Z01812 Encounter for preprocedural laboratory examination: Secondary | ICD-10-CM | POA: Diagnosis present

## 2022-06-15 DIAGNOSIS — E118 Type 2 diabetes mellitus with unspecified complications: Secondary | ICD-10-CM | POA: Insufficient documentation

## 2022-06-15 DIAGNOSIS — C50812 Malignant neoplasm of overlapping sites of left female breast: Secondary | ICD-10-CM

## 2022-06-15 DIAGNOSIS — Z794 Long term (current) use of insulin: Secondary | ICD-10-CM | POA: Diagnosis not present

## 2022-06-15 DIAGNOSIS — Z1379 Encounter for other screening for genetic and chromosomal anomalies: Secondary | ICD-10-CM | POA: Insufficient documentation

## 2022-06-15 LAB — GLUCOSE, CAPILLARY: Glucose-Capillary: 185 mg/dL — ABNORMAL HIGH (ref 70–99)

## 2022-06-15 MED ORDER — GADOPICLENOL 0.5 MMOL/ML IV SOLN
10.0000 mL | Freq: Once | INTRAVENOUS | Status: AC | PRN
  Administered 2022-06-15: 10 mL via INTRAVENOUS

## 2022-06-15 NOTE — Telephone Encounter (Signed)
I contacted Ms. Ribas to discuss her genetic testing results. No pathogenic variants were identified in the 70 genes analyzed. Detailed clinic note to follow.  The test report has been scanned into EPIC and is located under the Molecular Pathology section of the Results Review tab.  A portion of the result report is included below for reference.   Lucille Passy, MS, [facility] Genetic Counselor [email or web address] (P) [phone]

## 2022-06-15 NOTE — Progress Notes (Signed)
PCP - Vance Peper, NP Cardiologist - Had seen one in the [place] group Kathlyn [place]  PPM/ICD - Denies  Chest x-ray - NI EKG - 02/19/22 Stress Test - Denies ECHO - Will get one on the 20th of March. Prior echo on 11/28/16 Cardiac Cath - Denies  Sleep Study - Denies  DM - II CBG 185 @ PAT appt  Coffee this am with real sugar Fasting Blood Sugar - 90's - 100's Checks Blood Sugar ___2__ times a day. Needs new meter.  Last dose of GLP1 agonist-  Ozempic weekly last taken 06/08/22 GLP1 instructions: Asked to hold today's dose.  Blood Thinner Instructions: n/a Aspirin Instructions: Requested patient to call Dr. Melissa Montane office for instruction on when to stop.   ERAS Protcol - Yes  COVID TEST- N/I   Anesthesia review: Yes had seen cardiology in the past. Patient has had two appt cancelled from cardiology group d/t emergent situation on the doctor's end. Will go to [facility], requested patient to call and reschedule.   Patient denies shortness of breath, fever, cough and chest pain at PAT appointment.  Patient denied having any kind of respiratory illness, PNA, flu or cold in the last two months.    All instructions explained to the patient, with a verbal understanding of the material. Patient agrees to go over the instructions while at home for a better understanding.  The opportunity to ask questions was provided.

## 2022-06-15 NOTE — Telephone Encounter (Signed)
Scheduled appointment per 3/15 secure chat. Patient is aware of the made appointment.

## 2022-06-15 NOTE — Telephone Encounter (Signed)
Spoke with patient to follow up from [facility] 3/6 and assess navigation needs and answer any questions. Patient denies any questions at this time. Encouraged her to call should anything arise. Patient verbalized understanding.

## 2022-06-18 ENCOUNTER — Inpatient Hospital Stay: Payer: Managed Care, Other (non HMO) | Admitting: Pharmacist

## 2022-06-18 ENCOUNTER — Other Ambulatory Visit: Payer: Self-pay | Admitting: *Deleted

## 2022-06-18 ENCOUNTER — Encounter: Payer: Self-pay | Admitting: *Deleted

## 2022-06-18 ENCOUNTER — Ambulatory Visit: Payer: Self-pay | Admitting: Genetic Counselor

## 2022-06-18 ENCOUNTER — Inpatient Hospital Stay: Payer: Managed Care, Other (non HMO)

## 2022-06-18 ENCOUNTER — Encounter: Payer: Self-pay | Admitting: Genetic Counselor

## 2022-06-18 ENCOUNTER — Other Ambulatory Visit: Payer: Self-pay

## 2022-06-18 ENCOUNTER — Ambulatory Visit (HOSPITAL_BASED_OUTPATIENT_CLINIC_OR_DEPARTMENT_OTHER): Payer: Managed Care, Other (non HMO)

## 2022-06-18 ENCOUNTER — Encounter (HOSPITAL_BASED_OUTPATIENT_CLINIC_OR_DEPARTMENT_OTHER): Payer: Self-pay

## 2022-06-18 DIAGNOSIS — C773 Secondary and unspecified malignant neoplasm of axilla and upper limb lymph nodes: Secondary | ICD-10-CM

## 2022-06-18 DIAGNOSIS — C50812 Malignant neoplasm of overlapping sites of left female breast: Secondary | ICD-10-CM

## 2022-06-18 DIAGNOSIS — Z1379 Encounter for other screening for genetic and chromosomal anomalies: Secondary | ICD-10-CM

## 2022-06-18 NOTE — Progress Notes (Signed)
Patient here today for chemo education. Learned that her CT scans have not been approved. Scans had to be cancelled for today while waiting on PA.  Spoke with patient and she is aware.  While waiting on chemo education was able to get scans approved for [facility] [place] and r/s for 3/26. Patient aware of appt time and date.

## 2022-06-18 NOTE — Anesthesia Preprocedure Evaluation (Signed)
Anesthesia Evaluation  Patient identified by MRN, date of birth, ID band Patient awake    Reviewed: Allergy & Precautions, NPO status , Patient's Chart, lab work & pertinent test results  Airway Mallampati: III  TM Distance: >3 FB Neck ROM: Full    Dental  (+) Dental Advisory Given, Teeth Intact   Pulmonary former smoker   Pulmonary exam normal breath sounds clear to auscultation       Cardiovascular hypertension, Normal cardiovascular exam Rhythm:Regular Rate:Normal     Neuro/Psych  PSYCHIATRIC DISORDERS Anxiety Depression    negative neurological ROS     GI/Hepatic negative GI ROS, Neg liver ROS,,,  Endo/Other  diabetes, Well Controlled, Type 2  Morbid obesity  Renal/GU negative Renal ROS     Musculoskeletal negative musculoskeletal ROS (+)    Abdominal  (+) + obese  Peds  Hematology negative hematology ROS (+)   Anesthesia Other Findings   Reproductive/Obstetrics                             Anesthesia Physical Anesthesia Plan  ASA: 3  Anesthesia Plan: General   Post-op Pain Management: Tylenol PO (pre-op)*   Induction: Intravenous  PONV Risk Score and Plan: 3 and Ondansetron, Dexamethasone, Treatment may vary due to age or medical condition and Midazolam  Airway Management Planned: LMA  Additional Equipment:   Intra-op Plan:   Post-operative Plan: Extubation in OR  Informed Consent: I have reviewed the patients History and Physical, chart, labs and discussed the procedure including the risks, benefits and alternatives for the proposed anesthesia with the patient or authorized representative who has indicated his/her understanding and acceptance.     Dental advisory given  Plan Discussed with: CRNA  Anesthesia Plan Comments: (PAT note by Karoline Caldwell, PA-C: Prior cardiology evaluation in 2018 for palpitations.  Echo August 2018 showed normal LV systolic function, EF 65  to 70%, no significant valvular abnormalities.  She was started on carvedilol for symptomatic palpitations.  Last seen by Dr. Fletcher Anon on 03/27/2017 and noted to have almost complete resolution of symptoms.  No further testing was recommended.  She was advised to follow-up with cardiology on an as-needed basis.  Non-insulin-dependent DM2, well-controlled, A1c 5.7 on 06/11/2022.  She is on once weekly GLP-1 agonist Ozempic, reported last dose 06/08/2022.  CMP and CBC from 06/06/2022 reviewed, unremarkable.  EKG 02/14/2022: NSR.  Rate 84.  Low-voltage QRS.  Septal infarct, age undetermined.  TTE 11/28/2016: - Left ventricle: The cavity size was normal. Wall thickness was  increased in a pattern of mild LVH. Systolic function was  vigorous. The estimated ejection fraction was in the range of 65%  to 70%. Wall motion was normal; there were no regional wall  motion abnormalities.  - Right ventricle: The cavity size was normal. Wall thickness was  normal.   )        Anesthesia Quick Evaluation

## 2022-06-18 NOTE — Progress Notes (Signed)
HPI:   Lynn [name] was previously seen in the [place] clinic due to a personal and family history of cancer and concerns regarding a hereditary predisposition to cancer. Please refer to our prior cancer genetics clinic note for more information regarding our discussion, assessment and recommendations, at the time. Lynn [name] recent genetic test results were disclosed to her, as were recommendations warranted by these results. These results and recommendations are discussed in more detail below.  CANCER HISTORY:  Oncology History  Malignant neoplasm of overlapping sites of left breast in female, estrogen receptor positive ([place])  05/29/2022 Initial Diagnosis   Palpable abnormalities in the left breast led to mammograms which revealed 3 masses 0.7 cm at 1 o'clock position, 3 cm at 2 o'clock position, additional nodule 8 mm total span 5 cm, 5 abnormal axillary lymph nodes biopsy of the masses and the lymph node were positive for grade 3 IDC with high-grade DCIS with LVI, ER 95%, PR 40%, Ki-67 60%, HER2 2+ by IHC FISH negative   06/15/2022 Cancer Staging   Staging form: Breast, AJCC 8th Edition - Clinical: Stage IIIB (cT3, cN3, cM0, G3, ER+, PR+, HER2-) - Signed by Gardenia Phlegm, NP on 06/15/2022 Stage prefix: Initial diagnosis Histologic grading system: 3 grade system   06/15/2022 Breast MRI   IMPRESSION: 1. Multiple sites of biopsy proven malignancy with in the lateral left breast. The overall conglomerate of suspicious findings spans approximately 4.4 x 3.5 x 3.5 cm, and contains all 3 post biopsy marking clips. 2. 10+ morphologically abnormal level I, II, III and Rotter's lymph nodes within the left axilla. 3. Diffuse skin and trabecular thickening on the left, consistent with lymphovascular spread of disease. 4. No MRI evidence of malignancy on the right.   06/20/2022 -  Chemotherapy   Patient is on Treatment Plan : BREAST ADJUVANT DOSE DENSE AC q14d /  PACLitaxel q7d      Genetic Testing   [facility] Multi-Cancer Panel+RNA was Negative. Report date is 06/13/2022.  The Multi-Cancer + RNA Panel offered by [facility] includes sequencing and/or deletion/duplication analysis of the following 70 genes:  AIP*, ALK, APC*, ATM*, AXIN2*, BAP1*, BARD1*, BLM*, BMPR1A*, BRCA1*, BRCA2*, BRIP1*, CDC73*, CDH1*, CDK4, CDKN1B*, CDKN2A, CHEK2*, CTNNA1*, DICER1*, EPCAM (del/dup only), EGFR, FH*, FLCN*, GREM1 (promoter dup only), HOXB13, KIT, LZTR1, MAX*, MBD4, MEN1*, MET, MITF, MLH1*, MSH2*, MSH3*, MSH6*, MUTYH*, NF1*, NF2*, NTHL1*, PALB2*, PDGFRA, PMS2*, POLD1*, POLE*, POT1*, PRKAR1A*, PTCH1*, PTEN*, RAD51C*, RAD51D*, RB1*, RET, SDHA* (sequencing only), SDHAF2*, SDHB*, SDHC*, SDHD*, SMAD4*, SMARCA4*, SMARCB1*, SMARCE1*, STK11*, SUFU*, TMEM127*, TP53*, TSC1*, TSC2*, VHL*. RNA analysis is performed for * genes.     FAMILY HISTORY:  We obtained a detailed, 4-generation family history.  Significant diagnoses are noted below:      Lynn [name] maternal aunt has a history of colon cancer and a brain tumor (unknown if benign or malignant), both diagnosed in her 29s. Her maternal half-aunt was diagnosed with an unknown type of cancer. Lynn [name] has two paternal aunts. One was diagnosed with melanoma in her 65s and her second aunt was diagnosed with lung cancer at age 87 (she smoked). Lynn [name] is unaware of previous family history of genetic testing for hereditary cancer risks. There is no reported Ashkenazi Jewish ancestry.   GENETIC TEST RESULTS:  The [facility] Multi-Cancer Panel found no pathogenic mutations.   The Multi-Cancer + RNA Panel offered by [facility] includes sequencing and/or deletion/duplication analysis of the following 70 genes:  AIP*, ALK, APC*, ATM*, AXIN2*, BAP1*, BARD1*,  BLM*, BMPR1A*, BRCA1*, BRCA2*, BRIP1*, CDC73*, CDH1*, CDK4, CDKN1B*, CDKN2A, CHEK2*, CTNNA1*, DICER1*, EPCAM (del/dup only), EGFR, FH*, FLCN*, GREM1 (promoter dup only), HOXB13, KIT, LZTR1,  MAX*, MBD4, MEN1*, MET, MITF, MLH1*, MSH2*, MSH3*, MSH6*, MUTYH*, NF1*, NF2*, NTHL1*, PALB2*, PDGFRA, PMS2*, POLD1*, POLE*, POT1*, PRKAR1A*, PTCH1*, PTEN*, RAD51C*, RAD51D*, RB1*, RET, SDHA* (sequencing only), SDHAF2*, SDHB*, SDHC*, SDHD*, SMAD4*, SMARCA4*, SMARCB1*, SMARCE1*, STK11*, SUFU*, TMEM127*, TP53*, TSC1*, TSC2*, VHL*. RNA analysis is performed for * genes.  The test report has been scanned into EPIC and is located under the Molecular Pathology section of the Results Review tab.  A portion of the result report is included below for reference. Genetic testing reported out on 06/13/2022.       Even though a pathogenic variant was not identified, possible explanations for her personal history of cancer may include: There may be no hereditary risk for cancer in the family. The cancers in Lynn [name] and/or her family may be due to other genetic or environmental factors. There may be a gene mutation in one of these genes that current testing methods cannot detect, but that chance is small. There could be another gene that has not yet been discovered, or that we have not yet tested, that is responsible for the cancer diagnoses in the family.   Therefore, it is important to remain in touch with cancer genetics in the future so that we can continue to offer Lynn [name] the most up to date genetic testing.   ADDITIONAL GENETIC TESTING:  We discussed with Lynn [name] that her genetic testing was fairly extensive.  If there are genes identified to increase cancer risk that can be analyzed in the future, we would be happy to discuss and coordinate this testing at that time.    CANCER SCREENING RECOMMENDATIONS:  Lynn [name] test result is considered negative (normal).  This means that we have not identified a hereditary cause for her personal and family history of cancer at this time.   An individual's cancer risk and medical management are not determined by genetic test results alone. Overall cancer  risk assessment incorporates additional factors, including personal medical history, family history, and any available genetic information that may result in a personalized plan for cancer prevention and surveillance. Therefore, it is recommended she continue to follow the cancer management and screening guidelines provided by her oncology and primary healthcare provider.  RECOMMENDATIONS FOR FAMILY MEMBERS:   Since she did not inherit a mutation in a cancer predisposition gene included on this panel, her daughter could not have inherited a mutation from her in one of these genes. Individuals in this family might be at some increased risk of developing cancer, over the general population risk, due to the family history of cancer. We recommend women in this family have a yearly mammogram beginning at age 40, or 56 years younger than the earliest onset of cancer, an annual clinical breast exam, and perform monthly breast self-exams. Her daughter is recommended to begin annual breast MRIs at age 66 and switch to annual mammograms at age 29.  FOLLOW-UP:  Cancer genetics is a rapidly advancing field and it is possible that new genetic tests will be appropriate for her and/or her family members in the future. We encouraged her to remain in contact with cancer genetics on an annual basis so we can update her personal and family histories and let her know of advances in cancer genetics that may benefit this family.   Our contact number was provided. Lynn [name]  questions were answered to her satisfaction, and she knows she is welcome to call us at anytime with additional questions or concerns.   Lucille Passy, MS, [facility] Genetic Counselor [email or web address] (P) [phone]

## 2022-06-18 NOTE — Progress Notes (Signed)
[name]       Telephone: [phone]?Fax: [phone]   Oncology Clinical Pharmacist Practitioner Initial Assessment  Lynn [name] is a 37 y.o. female with a diagnosis of breast cancer. They were contacted today via in-person visit.  Indication/Regimen Doxorubicin (Adriamycin) and cyclophosphamide (Cytoxan) followed by paclitaxel (Taxol) are being used appropriately for treatment of breast cancer by Dr. Nicholas Lose.      Wt Readings from Last 1 Encounters:  06/15/22 (!) 317 lb 12.8 oz (144.2 kg)    Estimated body surface area is 2.61 meters squared as calculated from the following:   Height as of 06/15/22: 5\' 7"  (1.702 m).   Weight as of 06/15/22: 317 lb 12.8 oz (144.2 kg).  The dosing regimen is every 14 days for 4 cycles  Doxorubicin (60 mg/m2) on Day 1 Cyclophosphamide (600 mg/m2) on Day 1 Pegfilgrastim (6 mg) on Day 3  Followed by a dosing regimen that is every 7 days for 12 cycles  Paclitaxel (80 mg/m2) on Days 1, 8, 15, 22  It is planned to continue until treatment plan completion or unacceptable toxicity. The tentative start date is: 06/20/22  Dose Modifications None   Allergies Allergies  Allergen Reactions   Macrobid [Nitrofurantoin Macrocrystal] Shortness Of Breath and Nausea And Vomiting   Ibuprofen Hives    Vitals = No vitals or labs were done today for this chemotherapy education visit    06/15/2022    7:58 AM 06/11/2022    8:24 AM 06/06/2022   12:53 PM  Oncology Vitals  Height 170 cm 170 cm 170 cm  Weight 144.153 kg 145.151 kg 144.062 kg  Weight (lbs) 317 lbs 13 oz 320 lbs 317 lbs 10 oz  BMI 49.77 kg/m2   49.77 kg/m2 50.12 kg/m2   50.12 kg/m2 49.74 kg/m2   49.74 kg/m2  Temp 98.2 F (36.8 C) 98.3 F (36.8 C) 97.5 F (36.4 C)  Pulse Rate 94 88 87  BP 135/81 138/84 155/84  Resp 20  18  SpO2 97 % 97 % 97 %  BSA (m2) 2.61 m2   2.61 m2 2.62 m2   2.62 m2 2.61 m2   2.61 m2     Laboratory Data    Latest Ref Rng &  Units 06/06/2022   12:22 PM 02/14/2022   10:27 AM 12/07/2021    1:03 PM  CBC EXTENDED  WBC 4.0 - 10.5 K/uL 7.7  8.0  7.5   RBC 3.87 - 5.11 MIL/uL 4.79  4.46  4.66   Hemoglobin 12.0 - 15.0 g/dL 12.8  12.0  12.0   HCT 36.0 - 46.0 % 37.6  35.7  36.6   Platelets 150 - 400 K/uL 306  239  266.0   NEUT# 1.7 - 7.7 K/uL 4.8  5.4    Lymph# 0.7 - 4.0 K/uL 2.0  1.9         Latest Ref Rng & Units 06/06/2022   12:22 PM 02/14/2022   10:27 AM 12/07/2021    1:03 PM  CMP  Glucose 70 - 99 mg/dL 95  108  114   BUN 6 - 20 mg/dL 14  12  14    Creatinine 0.44 - 1.00 mg/dL 0.82  0.83  0.77   Sodium 135 - 145 mmol/L 137  136  135   Potassium 3.5 - 5.1 mmol/L 4.2  4.1  4.0   Chloride 98 - 111 mmol/L 102  104  98   CO2 22 - 32 mmol/L 29  23  29   Calcium 8.9 - 10.3 mg/dL 9.2  8.7  9.0   Total Protein 6.5 - 8.1 g/dL 7.7  6.8  7.2   Total Bilirubin 0.3 - 1.2 mg/dL 0.4  0.4  0.4   Alkaline Phos 38 - 126 U/L 55  46  60   AST 15 - 41 U/L 18  20  18    ALT 0 - 44 U/L 20  22  19     No results found for: "MG" No results found for: "CA2729"   Contraindications Contraindications were reviewed? Yes Contraindications to therapy were identified? No   Safety Precautions The following safety precautions were reviewed:  Fever: reviewed the importance of having a thermometer and the Centers for Disease Control and Prevention (CDC) definition of fever which is 100.62F (38C) or higher. Patient should call 24/7 triage at [phone] if experiencing a fever or any other symptoms Decreased white blood cells (WBCs) and increased risk for infection Decreased platelet count and increased risk of bleeding Decreased hemoglobin, part of the red blood cells that carry iron and oxygen Change in the color of urine Fatigue Hair loss Nausea or vomiting Mouth irritation or sores Doxorubicin (vesicant) Cardiotoxicity Hemorrhagic cystitis Secondary cancers Muscle or joint pain or weakness Peripheral neuropathy Hypersensitivity  reactions Avoid grapefruit products Intimacy, sexual activity, contraception, fertility Handling body fluids and waste  Medication Reconciliation Current Outpatient Medications  Medication Sig Dispense Refill   aspirin EC 325 MG tablet Take 325 mg by mouth in the morning.     Blood Glucose Monitoring Suppl (ACCU-CHEK AVIVA PLUS) w/Device KIT 1 each by Does not apply route daily at 6 (six) AM. 1 kit 0   carvedilol (COREG) 25 MG tablet Take 1 tablet (25 mg total) by mouth 2 (two) times daily with a meal. 180 tablet 1   cyclobenzaprine (FLEXERIL) 10 MG tablet Take 1 tablet (10 mg total) by mouth 3 (three) times daily as needed for muscle spasms. 30 tablet 1   famotidine (PEPCID) 20 MG tablet TAKE 1 TABLET(20 MG) BY MOUTH TWICE DAILY 180 tablet 0   glucose blood (ACCU-CHEK AVIVA PLUS) test strip Use as instructed 100 each 12   ONE TOUCH CLUB LANCETS MISC 1 each by Does not apply route daily. 100 each 11   acetaminophen (TYLENOL) 500 MG tablet Take 2 tablets (1,000 mg total) by mouth every 6 (six) hours as needed for moderate pain or headache. (Patient not taking: Reported on 06/18/2022) 30 tablet 0   ALPRAZolam (XANAX) 0.25 MG tablet Take 1 tablet 45 minutes before MRI, then 1 tablet once there if needed. Do not drive (Patient not taking: Reported on 06/18/2022) 2 tablet 0   aspirin-acetaminophen-caffeine (EXCEDRIN MIGRAINE) 250-250-65 MG tablet Take 1-2 tablets by mouth 2 (two) times daily as needed for headache or migraine. (Patient not taking: Reported on 06/18/2022)     dexamethasone (DECADRON) 4 MG tablet Take 1 tab on day 2 and 1 tab on day 3 Take with food. (Patient not taking: Reported on 06/18/2022) 12 tablet 0   lidocaine-prilocaine (EMLA) cream Apply to affected area once (Patient not taking: Reported on 06/18/2022) 30 g 3   ondansetron (ZOFRAN) 8 MG tablet Take 1 tab (8 mg) by mouth every 8 hrs as needed for nausea/vomiting. Start third day after doxorubicin/cyclophosphamide chemotherapy.  (Patient not taking: Reported on 06/18/2022) 30 tablet 1   prochlorperazine (COMPAZINE) 10 MG tablet Take 1 tablet (10 mg total) by mouth every 6 (six) hours as needed for nausea or  vomiting. (Patient not taking: Reported on 06/18/2022) 30 tablet 1   Semaglutide,0.25 or 0.5MG /DOS, (OZEMPIC, 0.25 OR 0.5 MG/DOSE,) 2 MG/1.5ML SOPN Inject 0.25 mg into the skin once a week. Start with 0.25MG  once a week x 4 weeks, then increase to 0.5MG  weekly. (Patient not taking: Reported on 06/18/2022) 1.5 mL 3   sertraline (ZOLOFT) 25 MG tablet Take 1 tablet (25 mg total) by mouth daily. (Patient not taking: Reported on 06/18/2022) 30 tablet 1   No current facility-administered medications for this visit.   Medication reconciliation is based on the patient's most recent medication list in the electronic medical record (EMR) including herbal products and OTC medications.   The patient's medication list was reviewed today with the patient? Yes   Drug-drug interactions (DDIs) DDIs were evaluated? Yes Significant DDIs identified? No   Drug-Food Interactions Drug-food interactions were evaluated? Yes Drug-food interactions identified?  Avoid grapefruit products  Follow-up Plan  Treatment start date: 06/20/22 Port placement date: 06/19/22 ECHO date: 06/20/22 P2P being done for CT CAP which was scheduled for today. It will now be rescheduled We reviewed prescriptions, premedications, and the treatment regimen with patient. Possible side effects of the treatment regimen were reviewed as well as management strategies.  She will use OTC loratadine as needed for G-CSF bone pain. She will use loperamide as needed for diarrhea. She will use Senna-S as needed for constipation Likely adjuvant abemaciclib candidate Clinical pharmacy will assist Dr. Nicholas Lose and York Ram on an as needed basis going forward  Lynn [name] participated in the discussion, expressed understanding, and voiced agreement with the above plan.  All questions were answered to her satisfaction. The patient was advised to contact the clinic at [phone] with any questions or concerns prior to her return visit.   I spent 60 minutes assessing the patient.  Raina Mina, RPH-CPP, 06/18/2022 10:49 AM  **Disclaimer: This note was dictated with voice recognition software. Similar sounding words can inadvertently be transcribed and this note may contain transcription errors which may not have been corrected upon publication of note.**

## 2022-06-18 NOTE — Research (Addendum)
Trial Name:  [place]  Patient Lynn [name] was identified by Dr Lindi Adie as a potential candidate for the above listed study.  This Clinical Research Nurse met with Lynn [name], Lynn [name] on 06/18/22 in a manner and location that ensures patient privacy to discuss participation in the above listed research study.  Patient is Unaccompanied.  Patient was previously provided with informed consent documents.  Patient confirmed they have read the informed consent documents.  As outlined in the informed consent form, this Nurse and [name] discussed the purpose of the research study, the investigational nature of the study, study procedures and requirements for study participation, potential risks and benefits of study participation, as well as alternatives to participation.  This study is not blinded or double-blinded. The patient understands participation is voluntary and they may withdraw from study participation at any time.  This study does not involve randomization.  This study does not involve an investigational drug or device. This study does not involve a placebo. Patient understands enrollment is pending full eligibility review.   Confidentiality and how the patient's information will be used as part of study participation were discussed.  Patient was informed there is reimbursement provided for their time and effort spent on trial participation.  The patient is encouraged to discuss research study participation with their insurance provider to determine what costs they may incur as part of study participation, including research related injury.    All questions were answered to patient's satisfaction.  The informed consent and separate HIPAA Authorization was reviewed page by page.  The patient's mental and emotional status is appropriate to provide informed consent, and the patient verbalizes an understanding of study participation.  Patient has agreed to participate in the above  listed research study and has voluntarily signed the informed consent version 09 Aug 2021 and separate HIPAA Authorization, version 5  on 06/18/22 at 1:03 PM.  The patient was provided with a copy of the signed informed consent form and separate HIPAA Authorization for their reference.  No study specific procedures were obtained prior to the signing of the informed consent document.  Approximately 15 minutes were spent with the patient reviewing the informed consent documents.  Patient was not requested to complete a Release of Information form.  Plan for patient to have blood drawn Wednesday 06/20/22 at [facility] appt, and gift card/reimbursement will be distributed at that time.  Marjie Skiff Erma Raiche, RN, BSN, [facility] She  Her  Hers Clinical Research Nurse [facility] Direct Dial [phone]  Pager [phone] 06/18/2022 3:54 PM    ADDENDUM: Incorrect consents used; will re-consent patient the morning of her blood draw.  Marjie Skiff Kandice Schmelter, RN, BSN, [facility] She  Her  Hers Clinical Research Nurse [facility] Direct Dial [phone]  Pager [phone] 06/18/2022 4:00 PM

## 2022-06-18 NOTE — Progress Notes (Signed)
Anesthesia Chart Review:  Prior cardiology evaluation in 2018 for palpitations.  Echo August 2018 showed normal LV systolic function, EF 65 to 70%, no significant valvular abnormalities.  She was started on carvedilol for symptomatic palpitations.  Last seen by Dr. Fletcher Anon on 03/27/2017 and noted to have almost complete resolution of symptoms.  No further testing was recommended.  She was advised to follow-up with cardiology on an as-needed basis.  Non-insulin-dependent DM2, well-controlled, A1c 5.7 on 06/11/2022.  She is on once weekly GLP-1 agonist Ozempic, reported last dose 06/08/2022.  CMP and CBC from 06/06/2022 reviewed, unremarkable.  EKG 02/14/2022: NSR.  Rate 84.  Low-voltage QRS.  Septal infarct, age undetermined.  TTE 11/28/2016: - Left ventricle: The cavity size was normal. Wall thickness was    increased in a pattern of mild LVH. Systolic function was    vigorous. The estimated ejection fraction was in the range of 65%    to 70%. Wall motion was normal; there were no regional wall    motion abnormalities.  - Right ventricle: The cavity size was normal. Wall thickness was    normal.      Wynonia Musty [facility] Short Stay Center/Anesthesiology Phone [phone] 06/18/2022 9:44 AM

## 2022-06-19 ENCOUNTER — Other Ambulatory Visit: Payer: Self-pay

## 2022-06-19 ENCOUNTER — Encounter (HOSPITAL_COMMUNITY)
Admission: RE | Disposition: A | Discharge status: Home / Self Care | Payer: Self-pay | Source: Home / Self Care | Attending: General Surgery

## 2022-06-19 ENCOUNTER — Encounter: Payer: Self-pay | Admitting: Hematology and Oncology

## 2022-06-19 ENCOUNTER — Ambulatory Visit (HOSPITAL_BASED_OUTPATIENT_CLINIC_OR_DEPARTMENT_OTHER): Payer: Managed Care, Other (non HMO) | Admitting: Certified Registered"

## 2022-06-19 ENCOUNTER — Ambulatory Visit (HOSPITAL_COMMUNITY)
Admission: RE | Admit: 2022-06-19 | Discharge: 2022-06-19 | Disposition: A | Discharge status: Home / Self Care | Payer: Managed Care, Other (non HMO) | Attending: General Surgery | Admitting: General Surgery

## 2022-06-19 ENCOUNTER — Ambulatory Visit (HOSPITAL_COMMUNITY): Payer: Managed Care, Other (non HMO) | Admitting: Physician Assistant

## 2022-06-19 ENCOUNTER — Ambulatory Visit (HOSPITAL_COMMUNITY): Payer: Managed Care, Other (non HMO)

## 2022-06-19 ENCOUNTER — Encounter (HOSPITAL_COMMUNITY): Payer: Self-pay | Admitting: General Surgery

## 2022-06-19 DIAGNOSIS — E119 Type 2 diabetes mellitus without complications: Secondary | ICD-10-CM

## 2022-06-19 DIAGNOSIS — C773 Secondary and unspecified malignant neoplasm of axilla and upper limb lymph nodes: Secondary | ICD-10-CM | POA: Insufficient documentation

## 2022-06-19 DIAGNOSIS — I10 Essential (primary) hypertension: Secondary | ICD-10-CM | POA: Insufficient documentation

## 2022-06-19 DIAGNOSIS — C50812 Malignant neoplasm of overlapping sites of left female breast: Secondary | ICD-10-CM | POA: Diagnosis present

## 2022-06-19 DIAGNOSIS — Z7985 Long-term (current) use of injectable non-insulin antidiabetic drugs: Secondary | ICD-10-CM | POA: Insufficient documentation

## 2022-06-19 DIAGNOSIS — Z170 Estrogen receptor positive status [ER+]: Secondary | ICD-10-CM | POA: Diagnosis not present

## 2022-06-19 DIAGNOSIS — Z6841 Body Mass Index (BMI) 40.0 and over, adult: Secondary | ICD-10-CM | POA: Insufficient documentation

## 2022-06-19 DIAGNOSIS — C50912 Malignant neoplasm of unspecified site of left female breast: Secondary | ICD-10-CM

## 2022-06-19 DIAGNOSIS — Z87891 Personal history of nicotine dependence: Secondary | ICD-10-CM | POA: Insufficient documentation

## 2022-06-19 HISTORY — PX: PORTACATH PLACEMENT: SHX2246

## 2022-06-19 LAB — GLUCOSE, CAPILLARY
Glucose-Capillary: 128 mg/dL — ABNORMAL HIGH (ref 70–99)
Glucose-Capillary: 117 mg/dL — ABNORMAL HIGH (ref 70–99)

## 2022-06-19 LAB — POCT PREGNANCY, URINE: Preg Test, Ur: NEGATIVE

## 2022-06-19 SURGERY — INSERTION, TUNNELED CENTRAL VENOUS DEVICE, WITH PORT
Anesthesia: General | Site: Chest

## 2022-06-19 MED ORDER — LIDOCAINE 2% (20 MG/ML) 5 ML SYRINGE
INTRAMUSCULAR | Status: DC | PRN
  Administered 2022-06-19: 100 mg via INTRAVENOUS

## 2022-06-19 MED ORDER — PROPOFOL 10 MG/ML IV BOLUS
INTRAVENOUS | Status: AC
  Filled 2022-06-19: qty 20

## 2022-06-19 MED ORDER — HEPARIN SOD (PORK) LOCK FLUSH 100 UNIT/ML IV SOLN
INTRAVENOUS | Status: DC | PRN
  Administered 2022-06-19: 500 [IU] via INTRAVENOUS

## 2022-06-19 MED ORDER — BUPIVACAINE HCL (PF) 0.25 % IJ SOLN
INTRAMUSCULAR | Status: AC
  Filled 2022-06-19: qty 30

## 2022-06-19 MED ORDER — ACETAMINOPHEN 500 MG PO TABS: 1000.0000 mg | ORAL_TABLET | Freq: Once | ORAL | Status: DC

## 2022-06-19 MED ORDER — EPHEDRINE SULFATE-NACL 50-0.9 MG/10ML-% IV SOSY
PREFILLED_SYRINGE | INTRAVENOUS | Status: DC | PRN
  Administered 2022-06-19: 10 mg via INTRAVENOUS

## 2022-06-19 MED ORDER — CHLORHEXIDINE GLUCONATE 0.12 % MT SOLN
15.0000 mL | OROMUCOSAL | Status: AC
  Administered 2022-06-19: 15 mL via OROMUCOSAL
  Filled 2022-06-19: qty 15

## 2022-06-19 MED ORDER — PROPOFOL 10 MG/ML IV BOLUS
INTRAVENOUS | Status: DC | PRN
  Administered 2022-06-19: 200 mg via INTRAVENOUS

## 2022-06-19 MED ORDER — HEPARIN SOD (PORK) LOCK FLUSH 100 UNIT/ML IV SOLN
INTRAVENOUS | Status: AC
  Filled 2022-06-19: qty 5

## 2022-06-19 MED ORDER — CHLORHEXIDINE GLUCONATE CLOTH 2 % EX PADS: 6.0000 | MEDICATED_PAD | Freq: Once | CUTANEOUS | Status: DC

## 2022-06-19 MED ORDER — ONDANSETRON HCL 4 MG/2ML IJ SOLN
INTRAMUSCULAR | Status: DC | PRN
  Administered 2022-06-19: 4 mg via INTRAVENOUS

## 2022-06-19 MED ORDER — FENTANYL CITRATE (PF) 250 MCG/5ML IJ SOLN
INTRAMUSCULAR | Status: AC
  Filled 2022-06-19: qty 5

## 2022-06-19 MED ORDER — FENTANYL CITRATE (PF) 250 MCG/5ML IJ SOLN
INTRAMUSCULAR | Status: DC | PRN
  Administered 2022-06-19 (×2): 50 ug via INTRAVENOUS

## 2022-06-19 MED ORDER — BUPIVACAINE HCL 0.25 % IJ SOLN
INTRAMUSCULAR | Status: DC | PRN
  Administered 2022-06-19: 7 mL

## 2022-06-19 MED ORDER — MIDAZOLAM HCL 2 MG/2ML IJ SOLN
INTRAMUSCULAR | Status: AC
  Filled 2022-06-19: qty 2

## 2022-06-19 MED ORDER — MIDAZOLAM HCL 2 MG/2ML IJ SOLN
INTRAMUSCULAR | Status: DC | PRN
  Administered 2022-06-19: 2 mg via INTRAVENOUS

## 2022-06-19 MED ORDER — CEFAZOLIN SODIUM-DEXTROSE 2-4 GM/100ML-% IV SOLN
2.0000 g | INTRAVENOUS | Status: AC
  Administered 2022-06-19: 2 g via INTRAVENOUS
  Filled 2022-06-19: qty 100

## 2022-06-19 MED ORDER — HEPARIN 6000 UNIT IRRIGATION SOLUTION
Status: AC
  Filled 2022-06-19: qty 500

## 2022-06-19 MED ORDER — PHENYLEPHRINE 80 MCG/ML (10ML) SYRINGE FOR IV PUSH (FOR BLOOD PRESSURE SUPPORT)
PREFILLED_SYRINGE | INTRAVENOUS | Status: DC | PRN
  Administered 2022-06-19: 200 ug via INTRAVENOUS
  Administered 2022-06-19: 240 ug via INTRAVENOUS
  Administered 2022-06-19: 200 ug via INTRAVENOUS
  Administered 2022-06-19: 160 ug via INTRAVENOUS
  Administered 2022-06-19: 200 ug via INTRAVENOUS
  Administered 2022-06-19: 160 ug via INTRAVENOUS

## 2022-06-19 MED ORDER — ROCURONIUM BROMIDE 10 MG/ML (PF) SYRINGE
PREFILLED_SYRINGE | INTRAVENOUS | Status: AC
  Filled 2022-06-19: qty 10

## 2022-06-19 MED ORDER — 0.9 % SODIUM CHLORIDE (POUR BTL) OPTIME
TOPICAL | Status: DC | PRN
  Administered 2022-06-19: 1000 mL

## 2022-06-19 MED ORDER — LIDOCAINE 2% (20 MG/ML) 5 ML SYRINGE
INTRAMUSCULAR | Status: AC
  Filled 2022-06-19: qty 5

## 2022-06-19 MED ORDER — HEPARIN 6000 UNIT IRRIGATION SOLUTION
Status: DC | PRN
  Administered 2022-06-19: 1

## 2022-06-19 MED ORDER — ACETAMINOPHEN 500 MG PO TABS
1000.0000 mg | ORAL_TABLET | ORAL | Status: AC
  Administered 2022-06-19: 1000 mg via ORAL
  Filled 2022-06-19: qty 2

## 2022-06-19 MED ORDER — DEXAMETHASONE SODIUM PHOSPHATE 10 MG/ML IJ SOLN
INTRAMUSCULAR | Status: DC | PRN
  Administered 2022-06-19: 10 mg via INTRAVENOUS

## 2022-06-19 MED ORDER — LACTATED RINGERS IV SOLN: INTRAVENOUS | Status: DC

## 2022-06-19 MED FILL — Dexamethasone Sodium Phosphate Inj 100 MG/10ML: INTRAMUSCULAR | Qty: 1 | Status: AC

## 2022-06-19 MED FILL — Fosaprepitant Dimeglumine For IV Infusion 150 MG (Base Eq): INTRAVENOUS | Qty: 5 | Status: AC

## 2022-06-19 SURGICAL SUPPLY — 45 items
ADH SKN CLS APL DERMABOND .7 (GAUZE/BANDAGES/DRESSINGS) ×1
APL PRP STRL LF DISP 70% ISPRP (MISCELLANEOUS) ×1
BAG COUNTER SPONGE SURGICOUNT (BAG) ×1 IMPLANT
BAG DECANTER FOR FLEXI CONT (MISCELLANEOUS) ×1 IMPLANT
BAG SPNG CNTER NS LX DISP (BAG) ×1
CHLORAPREP W/TINT 26 (MISCELLANEOUS) ×1 IMPLANT
COVER PROBE W GEL 5X96 (DRAPES) ×1 IMPLANT
COVER SURGICAL LIGHT HANDLE (MISCELLANEOUS) ×1 IMPLANT
DERMABOND ADVANCED .7 DNX12 (GAUZE/BANDAGES/DRESSINGS) ×1 IMPLANT
DRAPE C-ARM 42X120 X-RAY (DRAPES) ×1 IMPLANT
DRAPE CHEST BREAST 15X10 FENES (DRAPES) ×1 IMPLANT
DRSG TEGADERM 4X4.75 (GAUZE/BANDAGES/DRESSINGS) IMPLANT
ELECT CAUTERY BLADE 6.4 (BLADE) ×1 IMPLANT
ELECT REM PT RETURN 9FT ADLT (ELECTROSURGICAL) ×1
ELECTRODE REM PT RTRN 9FT ADLT (ELECTROSURGICAL) ×1 IMPLANT
GAUZE 4X4 16PLY ~~LOC~~+RFID DBL (SPONGE) ×1 IMPLANT
GAUZE SPONGE 4X4 12PLY STRL (GAUZE/BANDAGES/DRESSINGS) IMPLANT
GEL ULTRASOUND 20GR AQUASONIC (MISCELLANEOUS) ×1 IMPLANT
GLOVE BIO SURGEON STRL SZ7 (GLOVE) ×1 IMPLANT
GLOVE BIOGEL PI IND STRL 7.5 (GLOVE) ×1 IMPLANT
GOWN STRL REUS W/ TWL LRG LVL3 (GOWN DISPOSABLE) ×2 IMPLANT
GOWN STRL REUS W/TWL LRG LVL3 (GOWN DISPOSABLE) ×2
INTRODUCER COOK 11FR (CATHETERS) IMPLANT
KIT BASIN OR (CUSTOM PROCEDURE TRAY) ×1 IMPLANT
KIT PORT POWER 8FR ISP CVUE (Port) ×1 IMPLANT
KIT TURNOVER KIT B (KITS) ×1 IMPLANT
NS IRRIG 1000ML POUR BTL (IV SOLUTION) ×1 IMPLANT
PAD ARMBOARD 7.5X6 YLW CONV (MISCELLANEOUS) ×2 IMPLANT
PENCIL BUTTON HOLSTER BLD 10FT (ELECTRODE) ×1 IMPLANT
POSITIONER HEAD DONUT 9IN (MISCELLANEOUS) ×1 IMPLANT
SET INTRODUCER 12FR PACEMAKER (INTRODUCER) IMPLANT
SET SHEATH INTRODUCER 10FR (MISCELLANEOUS) IMPLANT
SHEATH COOK PEEL AWAY SET 9F (SHEATH) IMPLANT
SPIKE FLUID TRANSFER (MISCELLANEOUS) ×1 IMPLANT
STRIP CLOSURE SKIN 1/2X4 (GAUZE/BANDAGES/DRESSINGS) ×1 IMPLANT
SUT MNCRL AB 4-0 PS2 18 (SUTURE) ×1 IMPLANT
SUT PROLENE 2 0 SH DA (SUTURE) ×1 IMPLANT
SUT SILK 2 0 (SUTURE)
SUT SILK 2-0 18XBRD TIE 12 (SUTURE) IMPLANT
SUT VIC AB 3-0 SH 27 (SUTURE) ×1
SUT VIC AB 3-0 SH 27XBRD (SUTURE) ×1 IMPLANT
SYR 5ML LUER SLIP (SYRINGE) ×1 IMPLANT
TOWEL GREEN STERILE (TOWEL DISPOSABLE) ×1 IMPLANT
TOWEL GREEN STERILE FF (TOWEL DISPOSABLE) ×1 IMPLANT
TRAY LAPAROSCOPIC MC (CUSTOM PROCEDURE TRAY) ×1 IMPLANT

## 2022-06-19 NOTE — Interval H&P Note (Signed)
History and Physical Interval Note:  06/19/2022 12:08 PM  Lynn [name]  has presented today for surgery, with the diagnosis of BREAST CANCER.  The various methods of treatment have been discussed with the patient and family. After consideration of risks, benefits and other options for treatment, the patient has consented to  Procedure(s): INSERTION PORT-A-CATH (N/A) as a surgical intervention.  The patient's history has been reviewed, patient examined, no change in status, stable for surgery.  I have reviewed the patient's chart and labs.  Questions were answered to the patient's satisfaction.     Rolm Bookbinder

## 2022-06-19 NOTE — Op Note (Signed)
Preoperative diagnosis: Left breast cancer ,clinical stage III need for venous access Postoperative diagnosis: Same as above Procedure: Right internal jugular port placement Surgeon: Dr. Serita Grammes Anesthesia: General Estimated blood loss: Minimal Complications: None Specimens: None Drains: None Sponge and count was correct at completion Disposition to recovery stable addition   Indications: 52 yof with no prior history presents with skin changes and left breast mass. She has noted for 1.5 months. She had mm and [place] that shows in uoq a 7x6x5 mm mass and a 3x2x1.3 cm mass (total this area is about 5 cm). There are 5 abnormal nodes present as well. Biopsy of node is positive. Biopsy of breast masses is hg IDC with DCIS with LVI that is er pos at 95, pr pos at 30, her 2 negative and Ki is 60%. She is here to discuss options. She has no dc. She has an mri that shows a larger area as well as numerous nodes. She is planned to do primary systemic therapy and we discussed a port.    Procedure: After informed consent was obtained she was taken to the operating room.  She was given antibiotics.  SCDs were placed.  She was placed under general anesthesia without complication.  She had a roll placed between her shoulder blades.  She was then prepped and draped in the standard sterile surgical fashion.  Surgical timeout was then performed.   I used the ultrasound to identify the internal jugular vein on the right side.  I accessed this easily with the needle.  The wire was passed.  I confirmed that the wire was in the correct position both by ultrasound as well as by fluoroscopy.  I then infiltrated Marcaine and made an incision in her right chest.   I developed a pocket.  I then attempted to tunneled the line between the 2 sites.   I then was able to pull the line between the 2 sites.  I then placed the dilator under fluoroscopy over the wire.  The wire assembly was then removed.  The line was placed through  the sheath.  I pulled this back to be just proximal to the cavoatrial junction in the vena cava. This was confirmed by fluoroscopy and the line is ready for use at this point.  I then attached the port.  I sutured this in the pocket with a 2-0 Prolene suture.  I then confirmed it aspirated blood and flushed easily.  I then closed the incisions with 3-0 Vicryl  and 4-0 Monocryl.  I put glue and Steri-Strips on them.  I then accessed the port with the access needle.  I confirmed that this was in the correct position by aspirating blood.  I then packed this with heparin.  A dressing was placed that she will begin chemotherapy tomorrow.  She tolerated this well and was transferred recovery stable.

## 2022-06-19 NOTE — H&P (Signed)
42 yof with no prior history presents with skin changes and left breast mass. She has noted for 1.5 months. She had mm and [place] that shows in uoq a 7x6x5 mm mass and a 3x2x1.3 cm mass (total this area is about 5 cm). There are 5 abnormal nodes present as well. Biopsy of node is positive. Biopsy of breast masses is hg IDC with DCIS with LVI that is er pos at 95, pr pos at 30, her 2 negative and Ki is 60%. She is here to discuss options. She has no dc. She has history of resting tachycardia that she has seen cards for and is on coreg. She has left rc tear but not much limit on rom now  Review of Systems: A complete review of systems was obtained from the patient. I have reviewed this information and discussed as appropriate with the patient. See HPI as well for other ROS.  Review of Systems All other systems reviewed and are negative.   Medical History: Past Medical History: Diagnosis Date Anemia Chronic tachycardia 03/12/2019 Last Assessment & Plan: Restart coreg. Cont to monitor Essential hypertension 03/12/2019 Last Assessment & Plan: BP elevated in setting of being off medication. Will reassess in 4-6 weeks after restarting coreg. If elevated or can tolerate will consider lisinopril for prevention. Generalized anxiety disorder with panic attacks 03/12/2019 Last Assessment & Plan: Discussed starting fluoxetine as sister responding well to this and was having side effects to escitalopram. Start with low dose and titrate up. Return in 6 weeks - will do gad/phq-9 at next office visit. GERD (gastroesophageal reflux disease) History of cancer LVH (left ventricular hypertrophy) due to hypertensive disease, without heart failure 03/12/2019 Last Assessment & Plan: Monitor for worsening symptoms. On ECHO in 2018. 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 Encouraged taking medication and following diet. 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  Allergies Allergen Reactions Ibuprofen Hives Macrobid [Nitrofurantoin Monohyd/M-Cryst] Other (See Comments) Nausea, Vomiting and SOB Nitrofurantoin Macrocrystal Nausea And Vomiting and Shortness Of Breath  Current Outpatient Medications on File Prior to Visit Medication Sig Dispense Refill acetaminophen (TYLENOL) 500 MG tablet Take 1,000 mg by mouth every 6 (six) hours as needed for Pain aspirin 325 MG EC tablet Take 325 mg by mouth once daily busPIRone (BUSPAR) 5 MG tablet Take 5 mg by mouth 2 (two) times daily as needed carvediloL (COREG) 25 MG tablet Take 25 mg by mouth 2 (two) times daily with meals cyclobenzaprine (FLEXERIL) 10 MG tablet Take 10 mg by mouth 3 (three) times daily as needed for Muscle spasms ergocalciferol, vitamin D2, 1,250 mcg (50,000 unit) capsule Take 50,000 Units by mouth as directed Take 1 capsule (50,000) by mouth every seven (7) days famotidine (PEPCID) 20 MG tablet Take 20 mg by mouth 2 (two) times daily OZEMPIC 0.25 mg or 0.5 mg (2 mg/3 mL) pen  injector Inject 0.25-0.5 mg subcutaneously once a week INJECT 0.25 MG INTO SKIN FOR 4 WEEKS THEN INCREASE TO 0.5 MG WEEKL DULoxetine (CYMBALTA) 30 MG DR capsule Take 30 mg  by mouth once daily  No current facility-administered medications on file prior to visit.  No family history on file.  Social History  Tobacco Use Smoking Status Never Smokeless Tobacco Never   Social History  Socioeconomic History Marital status: Single Tobacco Use Smoking status: Never Smokeless tobacco: Never  Objective: There were no vitals filed for this visit. There is no height or weight on file to calculate BMI.  Physical Exam Vitals reviewed. Constitutional: Appearance: Normal appearance. Pulmonary: Effort: Pulmonary effort is normal. Chest: Breasts: Right: No inverted nipple, mass or nipple discharge. Left: Mass present. No inverted nipple or nipple discharge.  Comments: Uoq breast mass with hematoma Lymphadenopathy: Upper Body: Right upper body: No supraclavicular or axillary adenopathy. Left upper body: Axillary adenopathy present. No supraclavicular adenopathy. Neurological: Mental Status: She is alert.    Assessment and Plan:  Locally advanced left breast cancer  We discussed the staging and pathophysiology of breast cancer. We discussed all of the different options for treatment for breast cancer including surgery, chemotherapy, radiation therapy, Herceptin, and antiestrogen therapy.  She is going to receive chemotherapy and we discussed doing this primarily with benefits of that. Have discussed with oncology. Discussed a port placement as well. Will do MRI and genetics also. Will discuss surgery once chemo done, mri completed and genetics back  We discussed the risks of operation including bleeding, infection, possible reoperation.

## 2022-06-19 NOTE — Progress Notes (Signed)
Pt returned my call and is interested in applying for the [employer].  She's currently not receiving income so she will provide a letter of support on 06/20/22.  I emailed her an expense sheet and once I receive the letter of support I will approve her for the [employer].

## 2022-06-19 NOTE — Discharge Instructions (Signed)
PORT-A-CATH: POST OP INSTRUCTIONS  Always review your discharge instruction sheet given to you by the facility where your surgery was performed.   A prescription for pain medication may be given to you upon discharge. Take your pain medication as prescribed, if needed. If narcotic pain medicine is not needed, then you make take acetaminophen (Tylenol) or ibuprofen (Advil) as needed.  Take your usually prescribed medications unless otherwise directed. If you need a refill on your pain medication, please contact our office. All narcotic pain medicine now requires a paper prescription.  Phoned in and fax refills are no longer allowed by law.  Prescriptions will not be filled after 5 pm or on weekends.  You should follow a light diet for the remainder of the day after your procedure. Most patients will experience some mild swelling and/or bruising in the area of the incision. It may take several days to resolve. It is common to experience some constipation if taking pain medication after surgery. Increasing fluid intake and taking a stool softener (such as Colace) will usually help or prevent this problem from occurring. A mild laxative (Milk of Magnesia or Miralax) should be taken according to package directions if there are no bowel movements after 48 hours.  Unless discharge instructions indicate otherwise, you may remove your bandages 48 hours after surgery, and you may shower at that time. You may have steri-strips (small white skin tapes) in place directly over the incision.  These strips should be left on the skin for 7-10 days.  If your surgeon used Dermabond (skin glue) on the incision, you may shower in 24 hours.  The glue will flake off over the next 2-3 weeks.  If your port is left accessed at the end of surgery (needle left in port), the dressing cannot get wet and should only by changed by a healthcare professional. When the port is no longer accessed (when the needle has been removed), follow  step 7.   ACTIVITIES:  Limit activity involving your arms for the next 72 hours. Do no strenuous exercise or activity for 1 week. You may drive when you are no longer taking prescription pain medication, you can comfortably wear a seatbelt, and you can maneuver your car. 10.You may need to see your doctor in the office for a follow-up appointment.  Please       check with your doctor.  11.When you receive a new Port-a-Cath, you will get a product guide and        ID card.  Please keep them in case you need them.  WHEN TO CALL YOUR DOCTOR ([phone]): Fever over 101.0 Chills Continued bleeding from incision Increased redness and tenderness at the site Shortness of breath, difficulty breathing   The clinic staff is available to answer your questions during regular business hours. Please don't hesitate to call and ask to speak to one of the nurses or medical assistants for clinical concerns. If you have a medical emergency, go to the nearest emergency room or call 911.  A surgeon from [facility] is always on call at the hospital.     For further information, please visit [email or web address]      

## 2022-06-19 NOTE — Progress Notes (Signed)
Called pt to introduce myself as her Financial Resource Specialist and to discuss the [employer] grant.  I left a msg requesting she return my call if she's interested in applying for the grant.  

## 2022-06-19 NOTE — Anesthesia Postprocedure Evaluation (Signed)
Anesthesia Post Note  Patient: Lynn [name]  Procedure(s) Performed: INSERTION PORT-A-CATH (Chest)     Patient location during evaluation: PACU Anesthesia Type: General Level of consciousness: sedated and patient cooperative Pain management: pain level controlled Vital Signs Assessment: post-procedure vital signs reviewed and stable Respiratory status: spontaneous breathing Cardiovascular status: stable Anesthetic complications: no   No notable events documented.  Last Vitals:  Vitals:   06/19/22 1348 06/19/22 1400  BP:  122/82  Pulse:    Resp:    Temp: 36.4 C   SpO2:      Last Pain:  Vitals:   06/19/22 1348  TempSrc:   PainSc: 2                  Nolon Nations

## 2022-06-19 NOTE — Anesthesia Procedure Notes (Signed)
Procedure Name: LMA Insertion Date/Time: 06/19/2022 1:01 PM  Performed by: Lance Coon, CRNAPre-anesthesia Checklist: Patient identified, Emergency Drugs available, Suction available, Patient being monitored and Timeout performed Patient Re-evaluated:Patient Re-evaluated prior to induction Oxygen Delivery Method: Circle system utilized Preoxygenation: Pre-oxygenation with 100% oxygen Induction Type: IV induction LMA: LMA with gastric port inserted LMA Size: 4.0 Number of attempts: 1 Placement Confirmation: positive ETCO2 and breath sounds checked- equal and bilateral Tube secured with: Tape Dental Injury: Teeth and Oropharynx as per pre-operative assessment

## 2022-06-19 NOTE — Transfer of Care (Signed)
Immediate Anesthesia Transfer of Care Note  Patient: Lynn [name]  Procedure(s) Performed: INSERTION PORT-A-CATH (Chest)  Patient Location: PACU  Anesthesia Type:General  Level of Consciousness: drowsy and patient cooperative  Airway & Oxygen Therapy: Patient Spontanous Breathing  Post-op Assessment: Report given to RN and Post -op Vital signs reviewed and stable  Post vital signs: Reviewed and stable  Last Vitals:  Vitals Value Taken Time  BP    Temp    Pulse 94 06/19/22 1349  Resp 17 06/19/22 1349  SpO2 97 % 06/19/22 1349  Vitals shown include unvalidated device data.  Last Pain:  Vitals:   06/19/22 1100  TempSrc:   PainSc: 0-No pain      Patients Stated Pain Goal: 0 ([id number])  Complications: No notable events documented.

## 2022-06-20 ENCOUNTER — Encounter: Payer: Self-pay | Admitting: Hematology and Oncology

## 2022-06-20 ENCOUNTER — Inpatient Hospital Stay (HOSPITAL_COMMUNITY)
Admit: 2022-06-20 | Discharge: 2022-06-20 | Disposition: A | Discharge status: Home / Self Care | Payer: Managed Care, Other (non HMO)

## 2022-06-20 ENCOUNTER — Encounter (HOSPITAL_COMMUNITY): Payer: Self-pay

## 2022-06-20 ENCOUNTER — Inpatient Hospital Stay: Payer: Managed Care, Other (non HMO)

## 2022-06-20 ENCOUNTER — Telehealth: Payer: Self-pay

## 2022-06-20 ENCOUNTER — Inpatient Hospital Stay: Payer: Managed Care, Other (non HMO) | Admitting: Adult Health

## 2022-06-20 ENCOUNTER — Ambulatory Visit (HOSPITAL_COMMUNITY)
Admission: RE | Admit: 2022-06-20 | Discharge: 2022-06-20 | Disposition: A | Discharge status: Home / Self Care | Payer: Managed Care, Other (non HMO) | Source: Ambulatory Visit | Attending: Hematology and Oncology | Admitting: General Surgery

## 2022-06-20 ENCOUNTER — Other Ambulatory Visit: Payer: Self-pay

## 2022-06-20 ENCOUNTER — Encounter: Payer: Self-pay | Admitting: General Practice

## 2022-06-20 ENCOUNTER — Ambulatory Visit (HOSPITAL_COMMUNITY)
Admission: RE | Admit: 2022-06-20 | Discharge: 2022-06-20 | Disposition: A | Discharge status: Home / Self Care | Payer: Managed Care, Other (non HMO) | Source: Home / Self Care | Attending: Adult Health | Admitting: Adult Health

## 2022-06-20 ENCOUNTER — Encounter (HOSPITAL_COMMUNITY): Payer: Self-pay | Admitting: General Surgery

## 2022-06-20 ENCOUNTER — Other Ambulatory Visit: Payer: Managed Care, Other (non HMO)

## 2022-06-20 ENCOUNTER — Other Ambulatory Visit: Payer: Self-pay | Admitting: *Deleted

## 2022-06-20 VITALS — BP 136/66 | HR 97 | Resp 19

## 2022-06-20 DIAGNOSIS — Z170 Estrogen receptor positive status [ER+]: Secondary | ICD-10-CM

## 2022-06-20 DIAGNOSIS — C50812 Malignant neoplasm of overlapping sites of left female breast: Secondary | ICD-10-CM

## 2022-06-20 DIAGNOSIS — C773 Secondary and unspecified malignant neoplasm of axilla and upper limb lymph nodes: Secondary | ICD-10-CM

## 2022-06-20 DIAGNOSIS — R52 Pain, unspecified: Secondary | ICD-10-CM

## 2022-06-20 DIAGNOSIS — Z0189 Encounter for other specified special examinations: Secondary | ICD-10-CM

## 2022-06-20 LAB — ECHOCARDIOGRAM COMPLETE
AR max vel: 3.56 cm2
AV Area VTI: 3.6 cm2
AV Area mean vel: 3.64 cm2
AV Mean grad: 6 mmHg
AV Peak grad: 13.2 mmHg
Ao pk vel: 1.82 m/s
Area-P 1/2: 4.29 cm2
Height: 67 in
Weight: 4992 oz

## 2022-06-20 LAB — CBC WITH DIFFERENTIAL (CANCER CENTER ONLY)
Abs Immature Granulocytes: 0.08 10*3/uL — ABNORMAL HIGH (ref 0.00–0.07)
Basophils Absolute: 0 10*3/uL (ref 0.0–0.1)
Basophils Relative: 0 %
Eosinophils Absolute: 0 10*3/uL (ref 0.0–0.5)
Eosinophils Relative: 0 %
HCT: 36 % (ref 36.0–46.0)
Hemoglobin: 12.2 g/dL (ref 12.0–15.0)
Immature Granulocytes: 0 %
Lymphocytes Relative: 6 %
Lymphs Abs: 1.1 10*3/uL (ref 0.7–4.0)
MCH: 26.8 pg (ref 26.0–34.0)
MCHC: 33.9 g/dL (ref 30.0–36.0)
MCV: 78.9 fL — ABNORMAL LOW (ref 80.0–100.0)
Monocytes Absolute: 1.4 10*3/uL — ABNORMAL HIGH (ref 0.1–1.0)
Monocytes Relative: 8 %
Neutro Abs: 15.5 10*3/uL — ABNORMAL HIGH (ref 1.7–7.7)
Neutrophils Relative %: 86 %
Platelet Count: 299 10*3/uL (ref 150–400)
RBC: 4.56 MIL/uL (ref 3.87–5.11)
RDW: 13.1 % (ref 11.5–15.5)
WBC Count: 18.2 10*3/uL — ABNORMAL HIGH (ref 4.0–10.5)
nRBC: 0 % (ref 0.0–0.2)

## 2022-06-20 LAB — CMP (CANCER CENTER ONLY)
ALT: 16 U/L (ref 0–44)
AST: 12 U/L — ABNORMAL LOW (ref 15–41)
Albumin: 3.9 g/dL (ref 3.5–5.0)
Alkaline Phosphatase: 54 U/L (ref 38–126)
Anion gap: 7 (ref 5–15)
BUN: 16 mg/dL (ref 6–20)
CO2: 23 mmol/L (ref 22–32)
Calcium: 8.9 mg/dL (ref 8.9–10.3)
Chloride: 105 mmol/L (ref 98–111)
Creatinine: 0.64 mg/dL (ref 0.44–1.00)
GFR, Estimated: >60 mL/min (ref >60)
Glucose, Bld: 178 mg/dL — ABNORMAL HIGH (ref 70–99)
Potassium: 4.2 mmol/L (ref 3.5–5.1)
Sodium: 135 mmol/L (ref 135–145)
Total Bilirubin: 0.3 mg/dL (ref 0.3–1.2)
Total Protein: 7 g/dL (ref 6.5–8.1)

## 2022-06-20 MED ORDER — PALONOSETRON HCL INJECTION 0.25 MG/5ML
0.2500 mg | Freq: Once | INTRAVENOUS | Status: AC
  Administered 2022-06-20: 0.25 mg via INTRAVENOUS
  Filled 2022-06-20: qty 5

## 2022-06-20 MED ORDER — SODIUM CHLORIDE 0.9 % IV SOLN
600.0000 mg/m2 | Freq: Once | INTRAVENOUS | Status: AC
  Administered 2022-06-20: 1500 mg via INTRAVENOUS
  Filled 2022-06-20: qty 75

## 2022-06-20 MED ORDER — PERFLUTREN LIPID MICROSPHERE
1.0000 mL | INTRAVENOUS | Status: AC | PRN
  Administered 2022-06-20: 2 mL via INTRAVENOUS

## 2022-06-20 MED ORDER — HEPARIN SOD (PORK) LOCK FLUSH 100 UNIT/ML IV SOLN
500.0000 [IU] | Freq: Once | INTRAVENOUS | Status: AC | PRN
  Administered 2022-06-20: 500 [IU]

## 2022-06-20 MED ORDER — SODIUM CHLORIDE 0.9% FLUSH
10.0000 mL | INTRAVENOUS | Status: DC | PRN
  Administered 2022-06-20: 10 mL

## 2022-06-20 MED ORDER — DOXORUBICIN HCL CHEMO IV INJECTION 2 MG/ML
60.0000 mg/m2 | Freq: Once | INTRAVENOUS | Status: AC
  Administered 2022-06-20: 158 mg via INTRAVENOUS
  Filled 2022-06-20: qty 79

## 2022-06-20 MED ORDER — SODIUM CHLORIDE 0.9 % IV SOLN: Freq: Once | INTRAVENOUS | Status: AC

## 2022-06-20 MED ORDER — ACETAMINOPHEN 325 MG PO TABS
650.0000 mg | ORAL_TABLET | Freq: Once | ORAL | Status: AC
  Administered 2022-06-20: 650 mg via ORAL
  Filled 2022-06-20: qty 2

## 2022-06-20 MED ORDER — SODIUM CHLORIDE 0.9 % IV SOLN
10.0000 mg | Freq: Once | INTRAVENOUS | Status: AC
  Administered 2022-06-20: 10 mg via INTRAVENOUS
  Filled 2022-06-20: qty 10

## 2022-06-20 MED ORDER — DIPHENHYDRAMINE HCL 50 MG/ML IJ SOLN
25.0000 mg | Freq: Once | INTRAMUSCULAR | Status: AC
  Administered 2022-06-20: 25 mg via INTRAVENOUS
  Filled 2022-06-20: qty 1

## 2022-06-20 MED ORDER — SODIUM CHLORIDE 0.9 % IV SOLN
150.0000 mg | Freq: Once | INTRAVENOUS | Status: AC
  Administered 2022-06-20: 150 mg via INTRAVENOUS
  Filled 2022-06-20: qty 150

## 2022-06-20 NOTE — Progress Notes (Signed)
[place] Spiritual Care Note  Followed up with Lynn [name] in infusion. She reports continued good support from her social circles and is also interested in an Pharmacist, hospital so that she can talk to someone who has been through similar treatment.   Placed [employer] referral per her request. Amelea plans to contact chaplain as needed/desired for follow-up conversation.   [place], [place], [facility] Pager [phone] Voicemail [phone]

## 2022-06-20 NOTE — Research (Signed)
[employer]6: Procurement of Librarian, academic for the Discovery and Validation of Biomarkers for the Prediction, Diagnosis and Management of Disease: [place]  This Nurse has reviewed this patient's inclusion and exclusion criteria as a second review and confirms Lynn [name] is eligible for study participation.  Patient may continue with enrollment.  Wells Guiles 'Learta Codding' Neysa Bonito, RN, BSN, [facility] Clinical Research Nurse I 06/20/22 12:40 PM

## 2022-06-20 NOTE — Progress Notes (Signed)
  Echocardiogram 2D Echocardiogram has been performed.  Quita Mcgrory Renold Don 06/20/2022, 11:10 AM

## 2022-06-20 NOTE — Patient Instructions (Signed)
[facility]  Discharge Instructions: Thank you for choosing [facility] to provide your oncology and hematology care.   If you have a lab appointment with the [facility], please go directly to the [facility] and check in at the registration area.   Wear comfortable clothing and clothing appropriate for easy access to any Portacath or PICC line.   We strive to give you quality time with your provider. You may need to reschedule your appointment if you arrive late (15 or more minutes).  Arriving late affects you and other patients whose appointments are after yours.  Also, if you miss three or more appointments without notifying the office, you may be dismissed from the clinic at the provider's discretion.      For prescription refill requests, have your pharmacy contact our office and allow 72 hours for refills to be completed.    Today you received the following chemotherapy and/or immunotherapy agents: Doxorubicin, Cytoxan.       To help prevent nausea and vomiting after your treatment, we encourage you to take your nausea medication as directed.  BELOW ARE SYMPTOMS THAT SHOULD BE REPORTED IMMEDIATELY: *FEVER GREATER THAN 100.4 F (38 C) OR HIGHER *CHILLS OR SWEATING *NAUSEA AND VOMITING THAT IS NOT CONTROLLED WITH YOUR NAUSEA MEDICATION *UNUSUAL SHORTNESS OF BREATH *UNUSUAL BRUISING OR BLEEDING *URINARY PROBLEMS (pain or burning when urinating, or frequent urination) *BOWEL PROBLEMS (unusual diarrhea, constipation, pain near the anus) TENDERNESS IN MOUTH AND THROAT WITH OR WITHOUT PRESENCE OF ULCERS (sore throat, sores in mouth, or a toothache) UNUSUAL RASH, SWELLING OR PAIN  UNUSUAL VAGINAL DISCHARGE OR ITCHING   Items with * indicate a potential emergency and should be followed up as soon as possible or go to the Emergency Department if any problems should occur.  Please show the CHEMOTHERAPY ALERT CARD or IMMUNOTHERAPY ALERT  CARD at check-in to the Emergency Department and triage nurse.  Should you have questions after your visit or need to cancel or reschedule your appointment, please contact [facility]  Dept: [phone]  and follow the prompts.  Office hours are 8:00 a.m. to 4:30 p.m. Monday - Friday. Please note that voicemails left after 4:00 p.m. may not be returned until the following business day.  We are closed weekends and major holidays. You have access to a nurse at all times for urgent questions. Please call the main number to the clinic Dept: [phone] and follow the prompts.   For any non-urgent questions, you may also contact your provider using MyChart. We now offer e-Visits for anyone 64 and older to request care online for non-urgent symptoms. For details visit [email or web address].   Also download the MyChart app! Go to the app store, search "MyChart", open the app, select [facility], and log in with your MyChart username and password.  Doxorubicin Injection What is this medication? DOXORUBICIN (dox oh ROO bi sin) treats some types of cancer. It works by slowing down the growth of cancer cells. This medicine may be used for other purposes; ask your health care provider or pharmacist if you have questions. COMMON BRAND NAME(S): Adriamycin, Adriamycin PFS, Adriamycin RDF, Rubex What should I tell my care team before I take this medication? They need to know if you have any of these conditions: Heart disease History of low blood cell levels caused by a medication Liver disease Recent or ongoing radiation An unusual or allergic reaction to doxorubicin, other  medications, foods, dyes, or preservatives If you or your partner are pregnant or trying to get pregnant Breast-feeding How should I use this medication? This medication is injected into a vein. It is given by your care team in a hospital or clinic setting. Talk to your care team about the use of  this medication in children. Special care may be needed. Overdosage: If you think you have taken too much of this medicine contact a poison control center or emergency room at once. NOTE: This medicine is only for you. Do not share this medicine with others. What if I miss a dose? Keep appointments for follow-up doses. It is important not to miss your dose. Call your care team if you are unable to keep an appointment. What may interact with this medication? 6-mercaptopurine Paclitaxel Phenytoin St. John's wort Trastuzumab Verapamil This list may not describe all possible interactions. Give your health care provider a list of all the medicines, herbs, non-prescription drugs, or dietary supplements you use. Also tell them if you smoke, drink alcohol, or use illegal drugs. Some items may interact with your medicine. What should I watch for while using this medication? Your condition will be monitored carefully while you are receiving this medication. You may need blood work while taking this medication. This medication may make you feel generally unwell. This is not uncommon as chemotherapy can affect healthy cells as well as cancer cells. Report any side effects. Continue your course of treatment even though you feel ill unless your care team tells you to stop. There is a maximum amount of this medication you should receive throughout your life. The amount depends on the medical condition being treated and your overall health. Your care team will watch how much of this medication you receive. Tell your care team if you have taken this medication before. Your urine may turn red for a few days after your dose. This is not blood. If your urine is dark or brown, call your care team. In some cases, you may be given additional medications to help with side effects. Follow all directions for their use. This medication may increase your risk of getting an infection. Call your care team for advice if you get  a fever, chills, sore throat, or other symptoms of a cold or flu. Do not treat yourself. Try to avoid being around people who are sick. This medication may increase your risk to bruise or bleed. Call your care team if you notice any unusual bleeding. Talk to your care team about your risk of cancer. You may be more at risk for certain types of cancers if you take this medication. You should make sure that you get enough Coenzyme Q10 while you are taking this medication. Discuss the foods you eat and the vitamins you take with your care team. Talk to your care team if you or your partner may be pregnant. Serious birth defects can occur if you take this medication during pregnancy and for 6 months after the last dose. Contraception is recommended while taking this medication and for 6 months after the last dose. Your care team can help you find the option that works for you. If your partner can get pregnant, use a condom while taking this medication and for 6 months after the last dose. Do not breastfeed while taking this medication. This medication may cause infertility. Talk to your care team if you are concerned about your fertility. What side effects may I notice from receiving this medication?  Side effects that you should report to your care team as soon as possible: Allergic reactions--skin rash, itching, hives, swelling of the face, lips, tongue, or throat Heart failure--shortness of breath, swelling of the ankles, feet, or hands, sudden weight gain, unusual weakness or fatigue Heart rhythm changes--fast or irregular heartbeat, dizziness, feeling faint or lightheaded, chest pain, trouble breathing Infection--fever, chills, cough, sore throat, wounds that don't heal, pain or trouble when passing urine, general feeling of discomfort or being unwell Low red blood cell level--unusual weakness or fatigue, dizziness, headache, trouble breathing Painful swelling, warmth, or redness of the skin, blisters  or sores at the infusion site Unusual bruising or bleeding Side effects that usually do not require medical attention (report to your care team if they continue or are bothersome): Diarrhea Hair loss Nausea Pain, redness, or swelling with sores inside the mouth or throat Red urine This list may not describe all possible side effects. Call your doctor for medical advice about side effects. You may report side effects to FDA at 1-800-FDA-1088. Where should I keep my medication? This medication is given in a hospital or clinic. It will not be stored at home. NOTE: This sheet is a summary. It may not cover all possible information. If you have questions about this medicine, talk to your doctor, pharmacist, or health care provider.  2023 Elsevier/Gold Standard (2021-07-26 00:00:00) Cyclophosphamide Injection What is this medication? CYCLOPHOSPHAMIDE (sye kloe FOSS fa mide) treats some types of cancer. It works by slowing down the growth of cancer cells. This medicine may be used for other purposes; ask your health care provider or pharmacist if you have questions. COMMON BRAND NAME(S): Cyclophosphamide, Cytoxan, Neosar What should I tell my care team before I take this medication? They need to know if you have any of these conditions: Heart disease Irregular heartbeat or rhythm Infection Kidney problems Liver disease Low blood cell levels (white cells, platelets, or red blood cells) Lung disease Previous radiation Trouble passing urine An unusual or allergic reaction to cyclophosphamide, other medications, foods, dyes, or preservatives Pregnant or trying to get pregnant Breast-feeding How should I use this medication? This medication is injected into a vein. It is given by your care team in a hospital or clinic setting. Talk to your care team about the use of this medication in children. Special care may be needed. Overdosage: If you think you have taken too much of this medicine  contact a poison control center or emergency room at once. NOTE: This medicine is only for you. Do not share this medicine with others. What if I miss a dose? Keep appointments for follow-up doses. It is important not to miss your dose. Call your care team if you are unable to keep an appointment. What may interact with this medication? Amphotericin B Amiodarone Azathioprine Certain antivirals for HIV or hepatitis Certain medications for blood pressure, such as enalapril, lisinopril, quinapril Cyclosporine Diuretics Etanercept Indomethacin Medications that relax muscles Metronidazole Natalizumab Tamoxifen Warfarin This list may not describe all possible interactions. Give your health care provider a list of all the medicines, herbs, non-prescription drugs, or dietary supplements you use. Also tell them if you smoke, drink alcohol, or use illegal drugs. Some items may interact with your medicine. What should I watch for while using this medication? This medication may make you feel generally unwell. This is not uncommon as chemotherapy can affect healthy cells as well as cancer cells. Report any side effects. Continue your course of treatment even though you feel  ill unless your care team tells you to stop. You may need blood work while you are taking this medication. This medication may increase your risk of getting an infection. Call your care team for advice if you get a fever, chills, sore throat, or other symptoms of a cold or flu. Do not treat yourself. Try to avoid being around people who are sick. Avoid taking medications that contain aspirin, acetaminophen, ibuprofen, naproxen, or ketoprofen unless instructed by your care team. These medications may hide a fever. Be careful brushing or flossing your teeth or using a toothpick because you may get an infection or bleed more easily. If you have any dental work done, tell your dentist you are receiving this medication. Drink water or  other fluids as directed. Urinate often, even at night. Some products may contain alcohol. Ask your care team if this medication contains alcohol. Be sure to tell all care teams you are taking this medicine. Certain medicines, like metronidazole and disulfiram, can cause an unpleasant reaction when taken with alcohol. The reaction includes flushing, headache, nausea, vomiting, sweating, and increased thirst. The reaction can last from 30 minutes to several hours. Talk to your care team if you wish to become pregnant or think you might be pregnant. This medication can cause serious birth defects if taken during pregnancy and for 1 year after the last dose. A negative pregnancy test is required before starting this medication. A reliable form of contraception is recommended while taking this medication and for 1 year after the last dose. Talk to your care team about reliable forms of contraception. Do not father a child while taking this medication and for 4 months after the last dose. Use a condom during this time period. Do not breast-feed while taking this medication or for 1 week after the last dose. This medication may cause infertility. Talk to your care team if you are concerned about your fertility. Talk to your care team about your risk of cancer. You may be more at risk for certain types of cancer if you take this medication. What side effects may I notice from receiving this medication? Side effects that you should report to your care team as soon as possible: Allergic reactions--skin rash, itching, hives, swelling of the face, lips, tongue, or throat Dry cough, shortness of breath or trouble breathing Heart failure--shortness of breath, swelling of the ankles, feet, or hands, sudden weight gain, unusual weakness or fatigue Heart muscle inflammation--unusual weakness or fatigue, shortness of breath, chest pain, fast or irregular heartbeat, dizziness, swelling of the ankles, feet, or hands Heart  rhythm changes--fast or irregular heartbeat, dizziness, feeling faint or lightheaded, chest pain, trouble breathing Infection--fever, chills, cough, sore throat, wounds that don't heal, pain or trouble when passing urine, general feeling of discomfort or being unwell Kidney injury--decrease in the amount of urine, swelling of the ankles, hands, or feet Liver injury--right upper belly pain, loss of appetite, nausea, light-colored stool, dark yellow or brown urine, yellowing skin or eyes, unusual weakness or fatigue Low red blood cell level--unusual weakness or fatigue, dizziness, headache, trouble breathing Low sodium level--muscle weakness, fatigue, dizziness, headache, confusion Red or dark brown urine Unusual bruising or bleeding Side effects that usually do not require medical attention (report to your care team if they continue or are bothersome): Hair loss Irregular menstrual cycles or spotting Loss of appetite Nausea Pain, redness, or swelling with sores inside the mouth or throat Vomiting This list may not describe all possible side effects. Call  your doctor for medical advice about side effects. You may report side effects to FDA at 1-800-FDA-1088. Where should I keep my medication? This medication is given in a hospital or clinic. It will not be stored at home. NOTE: This sheet is a summary. It may not cover all possible information. If you have questions about this medicine, talk to your doctor, pharmacist, or health care provider.  2023 Elsevier/Gold Standard (2021-05-09 00:00:00)

## 2022-06-20 NOTE — Research (Signed)
Trial Name:  [facility]  Patient Lynn [name] was identified by Dr Lindi Adie as a potential candidate for the above listed study.  This Clinical Research Nurse met with Lynn [name], [id number] on 06/20/22 in a manner and location that ensures patient privacy to discuss participation in the above listed research study.  Patient is Accompanied by her fiance .  Patient was previously provided with informed consent documents.  Patient confirmed they have read the informed consent documents.  As outlined in the informed consent form, this Nurse and Lynn [name] discussed the purpose of the research study, the investigational nature of the study, study procedures and requirements for study participation, potential risks and benefits of study participation, as well as alternatives to participation.  This study is not blinded or double-blinded. The patient understands participation is voluntary and they may withdraw from study participation at any time.  This study does not involve randomization.  This study does not involve an investigational drug or device. This study does not involve a placebo. Patient understands enrollment is pending full eligibility review.   Confidentiality and how the patient's information will be used as part of study participation were discussed.  Patient was informed there is reimbursement provided for their time and effort spent on trial participation.  The patient is encouraged to discuss research study participation with their insurance provider to determine what costs they may incur as part of study participation, including research related injury.    All questions were answered to patient's satisfaction.  The informed consent and separate HIPAA Authorization was reviewed page by page.  The patient's mental and emotional status is appropriate to provide informed consent, and the patient verbalizes an understanding of study participation.  Patient has agreed to participate in  the above listed research study and has voluntarily signed the informed consent version date 11/10/21 and separate HIPAA Authorization, version 5  on 06/20/22 at 1150AM.  The patient was provided with a copy of the signed informed consent form and separate HIPAA Authorization for their reference.  No study specific procedures were obtained prior to the signing of the informed consent document.  Approximately 10 minutes were spent with the patient reviewing the informed consent documents.  Patient was not requested to complete a Release of Information form.  Blood was drawn after consent by Mickel Baas, LPN, who was re-dressing port.  Lynn Skiff Veretta Sabourin, RN, BSN, [facility]  [facility]  [facility] Clinical Research Nurse [place] [phone]  Pager [phone] 06/20/2022 12:11 PM

## 2022-06-20 NOTE — Telephone Encounter (Signed)
Notified Patient by voicemail of completion of FMLA and Disability Forms. Fax transmission confirmations received. Copies of forms placed for pick-up as requested by Patient.

## 2022-06-20 NOTE — Progress Notes (Signed)
[facility] Follow up:    Lynn Dancer, NP [place] [address]   DIAGNOSIS:  Cancer Staging  Malignant neoplasm of overlapping sites of left breast in female, estrogen receptor positive ([place]) Staging form: Breast, AJCC 8th Edition - Clinical stage from 06/04/2022: Stage IIB (cT2, cN1(f), cM0, G3, ER+, PR+, HER2-) - Signed by Hayden Pedro, PA-C on 06/04/2022 Stage prefix: Initial diagnosis Method of lymph node assessment: Core biopsy Histologic grading system: 3 grade system - Clinical: Stage IIIB (cT3, cN3, cM0, G3, ER+, PR+, HER2-) - Signed by Gardenia Phlegm, NP on 06/15/2022 Stage prefix: Initial diagnosis Histologic grading system: 3 grade system   SUMMARY OF ONCOLOGIC HISTORY: Oncology History  Malignant neoplasm of overlapping sites of left breast in female, estrogen receptor positive ([place])  05/29/2022 Initial Diagnosis   Palpable abnormalities in the left breast led to mammograms which revealed 3 masses 0.7 cm at 1 o'clock position, 3 cm at 2 o'clock position, additional nodule 8 mm total span 5 cm, 5 abnormal axillary lymph nodes biopsy of the masses and the lymph node were positive for grade 3 IDC with high-grade DCIS with LVI, ER 95%, PR 40%, Ki-67 60%, HER2 2+ by IHC FISH negative   06/15/2022 Cancer Staging   Staging form: Breast, AJCC 8th Edition - Clinical: Stage IIIB (cT3, cN3, cM0, G3, ER+, PR+, HER2-) - Signed by Gardenia Phlegm, NP on 06/15/2022 Stage prefix: Initial diagnosis Histologic grading system: 3 grade system   06/15/2022 Breast MRI   IMPRESSION: 1. Multiple sites of biopsy proven malignancy with in the lateral left breast. The overall conglomerate of suspicious findings spans approximately 4.4 x 3.5 x 3.5 cm, and contains all 3 post biopsy marking clips. 2. 10+ morphologically abnormal level I, II, III and Rotter's lymph nodes within the left axilla. 3. Diffuse skin and trabecular  thickening on the left, consistent with lymphovascular spread of disease. 4. No MRI evidence of malignancy on the right.   06/20/2022 -  Chemotherapy   Patient is on Treatment Plan : BREAST ADJUVANT DOSE DENSE AC q14d / PACLitaxel q7d      Genetic Testing   [facility] Multi-Cancer Panel+RNA was Negative. Report date is 06/13/2022.  The Multi-Cancer + RNA Panel offered by [facility] includes sequencing and/or deletion/duplication analysis of the following 70 genes:  AIP*, ALK, APC*, ATM*, AXIN2*, BAP1*, BARD1*, BLM*, BMPR1A*, BRCA1*, BRCA2*, BRIP1*, CDC73*, CDH1*, CDK4, CDKN1B*, CDKN2A, CHEK2*, CTNNA1*, DICER1*, EPCAM (del/dup only), EGFR, FH*, FLCN*, GREM1 (promoter dup only), HOXB13, KIT, LZTR1, MAX*, MBD4, MEN1*, MET, MITF, MLH1*, MSH2*, MSH3*, MSH6*, MUTYH*, NF1*, NF2*, NTHL1*, PALB2*, PDGFRA, PMS2*, POLD1*, POLE*, POT1*, PRKAR1A*, PTCH1*, PTEN*, RAD51C*, RAD51D*, RB1*, RET, SDHA* (sequencing only), SDHAF2*, SDHB*, SDHC*, SDHD*, SMAD4*, SMARCA4*, SMARCB1*, SMARCE1*, STK11*, SUFU*, TMEM127*, TP53*, TSC1*, TSC2*, VHL*. RNA analysis is performed for * genes.     CURRENT THERAPY: Adriamycin and Cytoxan   INTERVAL HISTORY: Lynn [name] 37 y.o. female returns for f/u and evaluation prior to receiving neoadjuvant chemotherapy with Adriamycin and Cytoxan.  She underwent port placement yesterday.  She has some soreness at the site.  The nurses had a question around the DG C-ARM imaging of the port placement that demonstrated the port tip was located at the SVC brachiocephalic junction region. Per their central line standard work, they asked for advice on this due it not noting location in the distal portion of the SVC.  Chest xray was obtained and demonstrated correct placement in the SVC.  She  also underwent echo today and it demonstrated LVEF of 60-65%.  She understands potential side effects, has picked up her anti nausea medication and knows how to take it.     Patient Active Problem List   Diagnosis  Date Noted   Genetic testing 06/15/2022   Anxiety and depression 06/11/2022   Malignant neoplasm of overlapping sites of left breast in female, estrogen receptor positive ([place]) 06/04/2022   Syncope and collapse [id number]   Umbilical hernia without obstruction and without gangrene 03/12/2022   Vitamin D deficiency 03/12/2022   Cervical radicular pain 12/07/2021   Snoring 12/07/2021   Other fatigue 12/07/2021   Chest pain 08/15/2021   Epigastric pain 08/15/2021   Acute pain of both shoulders 04/21/2021   Nerve pain 04/21/2021   Morbid obesity ([place]) 10/13/2020   Essential hypertension 03/12/2019   Chronic tachycardia 03/12/2019   Generalized anxiety disorder with panic attacks 03/12/2019   Family history of heart attack 03/12/2019   LVH (left ventricular hypertrophy) due to hypertensive disease, without heart failure 03/12/2019   Type 2 diabetes mellitus ([facility]) 03/12/2019    is allergic to macrobid [nitrofurantoin macrocrystal] and ibuprofen.  MEDICAL HISTORY: Past Medical History:  Diagnosis Date   Cancer ([facility]) 2024   L breast   Carpal tunnel syndrome, left    Diabetes ([facility])    Hypertension    Iron deficiency    Panic attacks    PCOS (polycystic ovarian syndrome)    Tachycardia     SURGICAL HISTORY: Past Surgical History:  Procedure Laterality Date   ADENOIDECTOMY     BREAST BIOPSY Left 05/29/2022   [place] LT BREAST BX W LOC DEV 1ST LESION IMG BX SPEC US GUIDE 05/29/2022 [facility]   BREAST BIOPSY Left 05/29/2022   [place] LT BREAST BX W LOC DEV EA ADD LESION IMG BX SPEC US GUIDE 05/29/2022 [facility]   BREAST BIOPSY Left 06/06/2022   MM LT BREAST BX W LOC DEV 1ST LESION IMAGE BX SPEC STEREO GUIDE 06/06/2022 [facility]   CESAREAN SECTION     PORTACATH PLACEMENT N/A 06/19/2022   Procedure: INSERTION PORT-A-CATH;  Surgeon: Rolm Bookbinder, MD;  Location: [facility];  Service: General;  Laterality: N/A;   TONSILLECTOMY      SOCIAL HISTORY: Social History    Socioeconomic History   Marital status: Significant Other    Spouse name: Not on file   Number of children: 1   Years of education: some college   Highest education level: Not on file  Occupational History   Not on file  Tobacco Use   Smoking status: Former    Packs/day: 1.00    Years: 7.00    Additional pack years: 0.00    Total pack years: 7.00    Types: Cigarettes, E-cigarettes    Quit date: 10/29/2021    Years since quitting: 0.6   Smokeless tobacco: Never  Vaping Use   Vaping Use: Former  Substance and Sexual Activity   Alcohol use: Yes    Comment: rarely   Drug use: No   Sexual activity: Yes    Birth control/protection: Condom  Other Topics Concern   Not on file  Social History Narrative      Social Determinants of Health   Financial Resource Strain: Not on file  Food Insecurity: Not on file  Transportation Needs: Not on file  Physical Activity: Not on file  Stress: Not on file  Social Connections: Not on file  Intimate Partner Violence: Not on file  FAMILY HISTORY: Family History  Problem Relation Age of Onset   Heart disease Mother    Diabetes Mother    Hypertension Mother    Kidney disease Mother    Heart failure Mother    Heart attack Mother 68   Arthritis Mother    Asthma Mother    COPD Mother    Depression Mother    Hyperlipidemia Mother    Mental illness Mother    Bladder Cancer Mother    Heart disease Father    Heart attack Father 1   COPD Father    Hyperlipidemia Father    Hypertension Father    Mental illness Father    Arthritis Sister    Depression Sister    Diabetes Sister    Hypertension Sister    Mental illness Sister    66 / Stillbirths Sister    Alcohol abuse Sister    Depression Sister    Diabetes Sister    Heart disease Sister    Heart attack Sister 67   Arthritis Sister    Kidney disease Sister    Transient ischemic attack Sister    Bladder Cancer Maternal Aunt    Colon cancer Maternal Aunt 51 - 102    Other Maternal Aunt 50 - 16       brain tumor (unknown if benign or malignant)   Lung cancer Paternal Aunt 65   Melanoma Paternal Aunt 3 - 49   Heart failure Maternal Grandmother    Arthritis Maternal Grandmother    Alcohol abuse Maternal Grandmother    COPD Maternal Grandmother    Diabetes Maternal Grandmother    Hearing loss Maternal Grandmother    Heart disease Maternal Grandmother    Hyperlipidemia Maternal Grandmother    Hypertension Maternal Grandmother    Stroke Maternal Grandmother    Miscarriages / Stillbirths Maternal Grandmother    Mental illness Maternal Grandmother    Heart attack Maternal Grandmother    Heart failure Maternal Grandfather    Alcohol abuse Maternal Grandfather    Arthritis Maternal Grandfather    Early death Maternal Grandfather    Heart disease Maternal Grandfather    Hyperlipidemia Maternal Grandfather    Hypertension Maternal Grandfather    Heart attack Maternal Grandfather 60   Mental illness Maternal Grandfather    Heart failure Paternal Grandmother    Arthritis Paternal Grandmother    Diabetes Paternal Grandmother    Heart attack Paternal 67 / Stillbirths Paternal Grandmother    Mental illness Paternal Grandmother    Kidney disease Paternal Grandmother    Intellectual disability Paternal Grandmother    Hypertension Paternal Grandmother    Hyperlipidemia Paternal Grandmother    Heart disease Paternal Grandmother    Heart failure Paternal Grandfather    Arthritis Paternal Grandfather    Heart attack Paternal Grandfather    Mental illness Paternal Grandfather    Hearing loss Paternal Grandfather    Heart disease Paternal Grandfather    Hyperlipidemia Paternal Grandfather    Hypertension Paternal Grandfather     Review of Systems  Constitutional:  Negative for appetite change, chills, fatigue, fever and unexpected weight change.  HENT:   Negative for hearing loss, lump/mass and trouble swallowing.   Eyes:   Negative for eye problems and icterus.  Respiratory:  Negative for chest tightness, cough and shortness of breath.   Cardiovascular:  Negative for chest pain, leg swelling and palpitations.  Gastrointestinal:  Negative for abdominal distention, abdominal pain, constipation, diarrhea, nausea and vomiting.  Endocrine: Negative for hot flashes.  Genitourinary:  Negative for difficulty urinating.   Musculoskeletal:  Negative for arthralgias.  Skin:  Negative for itching and rash.  Neurological:  Negative for dizziness, extremity weakness, headaches and numbness.  Hematological:  Negative for adenopathy. Does not bruise/bleed easily.  Psychiatric/Behavioral:  Negative for depression. The patient is not nervous/anxious.       PHYSICAL EXAMINATION  ECOG PERFORMANCE STATUS: 1 - Symptomatic but completely ambulatory  Vitals:   06/20/22 1117  BP: (!) 144/74  Pulse: 97  Resp: 18  Temp: 97.7 F (36.5 C)  SpO2: 99%    Physical Exam Constitutional:      General: She is not in acute distress.    Appearance: Normal appearance. She is not toxic-appearing.  HENT:     Head: Normocephalic and atraumatic.  Eyes:     General: No scleral icterus. Cardiovascular:     Rate and Rhythm: Normal rate and regular rhythm.     Pulses: Normal pulses.     Heart sounds: Normal heart sounds.  Pulmonary:     Effort: Pulmonary effort is normal.     Breath sounds: Normal breath sounds.  Abdominal:     General: Abdomen is flat. Bowel sounds are normal. There is no distension.     Palpations: Abdomen is soft.     Tenderness: There is no abdominal tenderness.  Musculoskeletal:        General: No swelling.     Cervical back: Neck supple.  Lymphadenopathy:     Cervical: No cervical adenopathy.  Skin:    General: Skin is warm and dry.     Findings: No rash.  Neurological:     General: No focal deficit present.     Mental Status: She is alert.  Psychiatric:        Mood and Affect: Mood normal.         Behavior: Behavior normal.     LABORATORY DATA:  CBC    Component Value Date/Time   WBC 18.2 (H) 06/20/2022 1130   WBC 8.0 02/14/2022 1027   RBC 4.56 06/20/2022 1130   HGB 12.2 06/20/2022 1130   HCT 36.0 06/20/2022 1130   PLT 299 06/20/2022 1130   MCV 78.9 (L) 06/20/2022 1130   MCH 26.8 06/20/2022 1130   MCHC 33.9 06/20/2022 1130   RDW 13.1 06/20/2022 1130   LYMPHSABS 1.1 06/20/2022 1130   MONOABS 1.4 (H) 06/20/2022 1130   EOSABS 0.0 06/20/2022 1130   BASOSABS 0.0 06/20/2022 1130    CMP     Component Value Date/Time   NA 135 06/20/2022 1130   K 4.2 06/20/2022 1130   CL 105 06/20/2022 1130   CO2 23 06/20/2022 1130   GLUCOSE 178 (H) 06/20/2022 1130   BUN 16 06/20/2022 1130   CREATININE 0.64 06/20/2022 1130   CALCIUM 8.9 06/20/2022 1130   PROT 7.0 06/20/2022 1130   ALBUMIN 3.9 06/20/2022 1130   AST 12 (L) 06/20/2022 1130   ALT 16 06/20/2022 1130   ALKPHOS 54 06/20/2022 1130   BILITOT 0.3 06/20/2022 1130   GFRNONAA >60 06/20/2022 1130   GFRAA >60 01/02/2020 2048          ASSESSMENT and THERAPY PLAN:   Malignant neoplasm of overlapping sites of left breast in female, estrogen receptor positive ([place]) 05/29/2022:Palpable abnormalities in the left breast led to mammograms which revealed 3 masses 0.7 cm at 1 o'clock position, 3 cm at 2 o'clock position, additional nodule 8 mm total span 5 cm,  5 abnormal axillary lymph nodes biopsy of the masses and the lymph node were positive for grade 3 IDC with high-grade DCIS with LVI, ER 95%, PR 40%, Ki-67 60%, HER2 2+ by IHC FISH negative  Pathology and radiology counseling: Discussed with the patient, the details of pathology including the type of breast cancer,the clinical staging, the significance of ER, PR and HER-2/neu receptors and the implications for treatment. After reviewing the pathology in detail, we proceeded to discuss the different treatment options between surgery, radiation, chemotherapy, antiestrogen  therapies.  Recommendation based on multidisciplinary tumor board: 1. Neoadjuvant chemotherapy with Adriamycin and Cytoxan dose dense 4 followed by Taxol weekly 12  2. Staging Scans with CT c/a/p and bone scan 3. Followed by breast conserving surgery with targeted axillary dissection 4. Followed by adjuvant radiation therapy 5.  Followed by antiestrogen therapy   Lynn [name] will proceed with treatment today.  We discussed the risks of potential side effects and she knows to call if she develops any concerns that she has questions about.  WQe reviewed her treatment, her anti nausea medication, and next steps.  She will return in 1 week for labs and f/u to see how she tolerated her treatment.      All questions were answered. The patient knows to call the clinic with any problems, questions or concerns. We can certainly see the patient much sooner if necessary.  Total encounter time:20 minutes*in face-to-face visit time, chart review, lab review, care coordination, order entry, and documentation of the encounter time.    Wilber Bihari, NP 06/22/22 1:24 PM Medical Oncology and Hematology [facility] [place], [place] [address] Tel. [phone]    Fax. [phone]  *Total Encounter Time as defined by the Centers for Medicare and Medicaid Services includes, in addition to the face-to-face time of a patient visit (documented in the note above) non-face-to-face time: obtaining and reviewing outside history, ordering and reviewing medications, tests or procedures, care coordination (communications with other health care professionals or caregivers) and documentation in the medical record.

## 2022-06-20 NOTE — Progress Notes (Signed)
Pt is approved for the $1000 [employer] grant.  

## 2022-06-20 NOTE — Research (Signed)
Title: [id number]  This Nurse has reviewed this patient's inclusion and exclusion criteria and confirmed Lynn [name] is eligible for study participation.  Patient will continue with enrollment.  Eligibility confirmed by treating investigator, who also agrees that patient should proceed with enrollment.  Marjie Skiff Annelise Mccoy, RN, BSN, [facility]  [facility]  [facility] Clinical Research Nurse [facility] Direct Dial [phone]  Pager [phone] 06/20/2022 12:13 PM

## 2022-06-20 NOTE — Progress Notes (Signed)
ty

## 2022-06-21 ENCOUNTER — Telehealth: Payer: Self-pay

## 2022-06-21 ENCOUNTER — Ambulatory Visit: Payer: Managed Care, Other (non HMO)

## 2022-06-21 ENCOUNTER — Ambulatory Visit: Payer: Managed Care, Other (non HMO) | Admitting: Hematology and Oncology

## 2022-06-21 ENCOUNTER — Other Ambulatory Visit: Payer: Managed Care, Other (non HMO)

## 2022-06-21 NOTE — Telephone Encounter (Signed)
-----   Message from Rafael Bihari, RN sent at 06/20/2022  3:32 PM EDT ----- Regarding: [name], first time Doxorubicin and Cytoxan [name] came in 3/20 for first time Doxorubicin and Cytoxan. Tolerated infusions well. Needs call back.

## 2022-06-21 NOTE — Telephone Encounter (Signed)
LM for patient that this nurse was calling to see how they were doing after their treatment. Please call back to Dr. Gudena's nurse at [phone] if they have any questions or concerns regarding the treatment. 

## 2022-06-22 ENCOUNTER — Encounter: Payer: Self-pay | Admitting: Hematology and Oncology

## 2022-06-22 ENCOUNTER — Ambulatory Visit (HOSPITAL_COMMUNITY): Payer: Managed Care, Other (non HMO)

## 2022-06-22 ENCOUNTER — Other Ambulatory Visit: Payer: Self-pay | Admitting: *Deleted

## 2022-06-22 ENCOUNTER — Inpatient Hospital Stay: Payer: Managed Care, Other (non HMO)

## 2022-06-22 VITALS — BP 138/79 | HR 83 | Temp 98.2°F | Resp 18

## 2022-06-22 DIAGNOSIS — C50812 Malignant neoplasm of overlapping sites of left female breast: Secondary | ICD-10-CM | POA: Diagnosis not present

## 2022-06-22 MED ORDER — PEGFILGRASTIM-CBQV 6 MG/0.6ML ~~LOC~~ SOSY
6.0000 mg | PREFILLED_SYRINGE | Freq: Once | SUBCUTANEOUS | Status: AC
  Administered 2022-06-22: 6 mg via SUBCUTANEOUS
  Filled 2022-06-22: qty 0.6

## 2022-06-22 NOTE — Assessment & Plan Note (Signed)
05/29/2022:Palpable abnormalities in the left breast led to mammograms which revealed 3 masses 0.7 cm at 1 o'clock position, 3 cm at 2 o'clock position, additional nodule 8 mm total span 5 cm, 5 abnormal axillary lymph nodes biopsy of the masses and the lymph node were positive for grade 3 IDC with high-grade DCIS with LVI, ER 95%, PR 40%, Ki-67 60%, HER2 2+ by IHC FISH negative  Pathology and radiology counseling: Discussed with the patient, the details of pathology including the type of breast cancer,the clinical staging, the significance of ER, PR and HER-2/neu receptors and the implications for treatment. After reviewing the pathology in detail, we proceeded to discuss the different treatment options between surgery, radiation, chemotherapy, antiestrogen therapies.  Recommendation based on multidisciplinary tumor board: 1. Neoadjuvant chemotherapy with Adriamycin and Cytoxan dose dense 4 followed by Taxol weekly 12  2. Staging Scans with CT c/a/p and bone scan 3. Followed by breast conserving surgery with targeted axillary dissection 4. Followed by adjuvant radiation therapy 5.  Followed by antiestrogen therapy   Lynn [name] will proceed with treatment today.  We discussed the risks of potential side effects and she knows to call if she develops any concerns that she has questions about.  WQe reviewed her treatment, her anti nausea medication, and next steps.  She will return in 1 week for labs and f/u to see how she tolerated her treatment.

## 2022-06-22 NOTE — Progress Notes (Signed)
Pt had concerns of redness across chest that had come after treatment. Pt did state that staff was aware and advised to take Benadryl. NO raised bumps at the site. Advised to continue to watch for raised and pustule bumps. If itching arises continue to Benadryl as directed. Pt verbalized understanding

## 2022-06-23 ENCOUNTER — Ambulatory Visit: Payer: Managed Care, Other (non HMO)

## 2022-06-26 ENCOUNTER — Ambulatory Visit
Admission: RE | Admit: 2022-06-26 | Discharge: 2022-06-26 | Disposition: A | Discharge status: Home / Self Care | Payer: Managed Care, Other (non HMO) | Source: Ambulatory Visit | Attending: Hematology and Oncology | Admitting: Hematology and Oncology

## 2022-06-26 ENCOUNTER — Ambulatory Visit
Admission: RE | Admit: 2022-06-26 | Discharge: 2022-06-26 | Disposition: A | Discharge status: Home / Self Care | Payer: Managed Care, Other (non HMO) | Source: Ambulatory Visit | Attending: Hematology and Oncology

## 2022-06-26 DIAGNOSIS — C773 Secondary and unspecified malignant neoplasm of axilla and upper limb lymph nodes: Secondary | ICD-10-CM

## 2022-06-26 DIAGNOSIS — C50812 Malignant neoplasm of overlapping sites of left female breast: Secondary | ICD-10-CM

## 2022-06-26 MED ORDER — IOPAMIDOL (ISOVUE-300) INJECTION 61%
100.0000 mL | Freq: Once | INTRAVENOUS | Status: AC | PRN
  Administered 2022-06-26: 100 mL via INTRAVENOUS

## 2022-06-27 ENCOUNTER — Encounter: Payer: Self-pay | Admitting: Adult Health

## 2022-06-27 ENCOUNTER — Encounter: Payer: Self-pay | Admitting: *Deleted

## 2022-06-27 ENCOUNTER — Inpatient Hospital Stay: Payer: Managed Care, Other (non HMO)

## 2022-06-27 ENCOUNTER — Inpatient Hospital Stay (HOSPITAL_BASED_OUTPATIENT_CLINIC_OR_DEPARTMENT_OTHER): Payer: Managed Care, Other (non HMO) | Admitting: Adult Health

## 2022-06-27 VITALS — BP 133/81 | HR 88 | Temp 97.7°F | Resp 18 | Ht 67.0 in | Wt 315.1 lb

## 2022-06-27 DIAGNOSIS — N949 Unspecified condition associated with female genital organs and menstrual cycle: Secondary | ICD-10-CM

## 2022-06-27 DIAGNOSIS — Z170 Estrogen receptor positive status [ER+]: Secondary | ICD-10-CM

## 2022-06-27 DIAGNOSIS — C50812 Malignant neoplasm of overlapping sites of left female breast: Secondary | ICD-10-CM | POA: Diagnosis not present

## 2022-06-27 DIAGNOSIS — Z95828 Presence of other vascular implants and grafts: Secondary | ICD-10-CM

## 2022-06-27 LAB — CBC WITH DIFFERENTIAL (CANCER CENTER ONLY)
Abs Immature Granulocytes: 0.04 10*3/uL (ref 0.00–0.07)
Basophils Absolute: 0.1 10*3/uL (ref 0.0–0.1)
Basophils Relative: 3 %
Eosinophils Absolute: 0.2 10*3/uL (ref 0.0–0.5)
Eosinophils Relative: 7 %
HCT: 34.8 % — ABNORMAL LOW (ref 36.0–46.0)
Hemoglobin: 11.9 g/dL — ABNORMAL LOW (ref 12.0–15.0)
Immature Granulocytes: 2 %
Lymphocytes Relative: 29 %
Lymphs Abs: 0.8 10*3/uL (ref 0.7–4.0)
MCH: 26.5 pg (ref 26.0–34.0)
MCHC: 34.2 g/dL (ref 30.0–36.0)
MCV: 77.5 fL — ABNORMAL LOW (ref 80.0–100.0)
Monocytes Absolute: 0.2 10*3/uL (ref 0.1–1.0)
Monocytes Relative: 7 %
Neutro Abs: 1.4 10*3/uL — ABNORMAL LOW (ref 1.7–7.7)
Neutrophils Relative %: 52 %
Platelet Count: 205 10*3/uL (ref 150–400)
RBC: 4.49 MIL/uL (ref 3.87–5.11)
RDW: 12.6 % (ref 11.5–15.5)
Smear Review: NORMAL
WBC Count: 2.7 10*3/uL — ABNORMAL LOW (ref 4.0–10.5)
nRBC: 0 % (ref 0.0–0.2)

## 2022-06-27 LAB — CMP (CANCER CENTER ONLY)
ALT: 12 U/L (ref 0–44)
AST: 10 U/L — ABNORMAL LOW (ref 15–41)
Albumin: 3.9 g/dL (ref 3.5–5.0)
Alkaline Phosphatase: 70 U/L (ref 38–126)
Anion gap: 6 (ref 5–15)
BUN: 12 mg/dL (ref 6–20)
CO2: 28 mmol/L (ref 22–32)
Calcium: 8.7 mg/dL — ABNORMAL LOW (ref 8.9–10.3)
Chloride: 100 mmol/L (ref 98–111)
Creatinine: 0.67 mg/dL (ref 0.44–1.00)
GFR, Estimated: >60 mL/min (ref >60)
Glucose, Bld: 149 mg/dL — ABNORMAL HIGH (ref 70–99)
Potassium: 4.4 mmol/L (ref 3.5–5.1)
Sodium: 134 mmol/L — ABNORMAL LOW (ref 135–145)
Total Bilirubin: 0.5 mg/dL (ref 0.3–1.2)
Total Protein: 7.1 g/dL (ref 6.5–8.1)

## 2022-06-27 MED ORDER — HEPARIN SOD (PORK) LOCK FLUSH 100 UNIT/ML IV SOLN
500.0000 [IU] | INTRAVENOUS | Status: AC | PRN
  Administered 2022-06-27: 500 [IU]

## 2022-06-27 MED ORDER — SODIUM CHLORIDE 0.9% FLUSH
10.0000 mL | INTRAVENOUS | Status: AC | PRN
  Administered 2022-06-27: 10 mL

## 2022-06-27 NOTE — Progress Notes (Signed)
Patient Care Team: Charyl Dancer, NP as PCP - General (Internal Medicine) Waunita Schooner, MD as Consulting Physician (Family Medicine) Waunita Schooner, MD as Consulting Physician (Family Medicine) Nicholas Lose, MD as Consulting Physician (Hematology and Oncology) Rolm Bookbinder, MD as Consulting Physician (General Surgery) Kyung Rudd, MD as Consulting Physician (Radiation Oncology) Rockwell Germany, RN as Oncology Nurse Navigator Mauro Kaufmann, RN as Oncology Nurse Navigator  DIAGNOSIS: No diagnosis found.  SUMMARY OF ONCOLOGIC HISTORY: Oncology History  Malignant neoplasm of overlapping sites of left breast in female, estrogen receptor positive ([place])  05/29/2022 Initial Diagnosis   Palpable abnormalities in the left breast led to mammograms which revealed 3 masses 0.7 cm at 1 o'clock position, 3 cm at 2 o'clock position, additional nodule 8 mm total span 5 cm, 5 abnormal axillary lymph nodes biopsy of the masses and the lymph node were positive for grade 3 IDC with high-grade DCIS with LVI, ER 95%, PR 40%, Ki-67 60%, HER2 2+ by IHC FISH negative   06/15/2022 Cancer Staging   Staging form: Breast, AJCC 8th Edition - Clinical: Stage IIIB (cT3, cN3, cM0, G3, ER+, PR+, HER2-) - Signed by Gardenia Phlegm, NP on 06/15/2022 Stage prefix: Initial diagnosis Histologic grading system: 3 grade system   06/15/2022 Breast MRI   IMPRESSION: 1. Multiple sites of biopsy proven malignancy with in the lateral left breast. The overall conglomerate of suspicious findings spans approximately 4.4 x 3.5 x 3.5 cm, and contains all 3 post biopsy marking clips. 2. 10+ morphologically abnormal level I, II, III and Rotter's lymph nodes within the left axilla. 3. Diffuse skin and trabecular thickening on the left, consistent with lymphovascular spread of disease. 4. No MRI evidence of malignancy on the right.   06/20/2022 -  Chemotherapy   Patient is on Treatment Plan : BREAST ADJUVANT DOSE  DENSE AC q14d / PACLitaxel q7d      Genetic Testing   [facility] Multi-Cancer Panel+RNA was Negative. Report date is 06/13/2022.  The Multi-Cancer + RNA Panel offered by [facility] includes sequencing and/or deletion/duplication analysis of the following 70 genes:  AIP*, ALK, APC*, ATM*, AXIN2*, BAP1*, BARD1*, BLM*, BMPR1A*, BRCA1*, BRCA2*, BRIP1*, CDC73*, CDH1*, CDK4, CDKN1B*, CDKN2A, CHEK2*, CTNNA1*, DICER1*, EPCAM (del/dup only), EGFR, FH*, FLCN*, GREM1 (promoter dup only), HOXB13, KIT, LZTR1, MAX*, MBD4, MEN1*, MET, MITF, MLH1*, MSH2*, MSH3*, MSH6*, MUTYH*, NF1*, NF2*, NTHL1*, PALB2*, PDGFRA, PMS2*, POLD1*, POLE*, POT1*, PRKAR1A*, PTCH1*, PTEN*, RAD51C*, RAD51D*, RB1*, RET, SDHA* (sequencing only), SDHAF2*, SDHB*, SDHC*, SDHD*, SMAD4*, SMARCA4*, SMARCB1*, SMARCE1*, STK11*, SUFU*, TMEM127*, TP53*, TSC1*, TSC2*, VHL*. RNA analysis is performed for * genes.     CHIEF COMPLIANT: Left breast cancer  INTERVAL HISTORY: Lynn [name] is a 37 y.o. female is here because of recent diagnosis of left breast cancer. She presents to the clinic for a follow-up.    ALLERGIES:  is allergic to macrobid [nitrofurantoin macrocrystal] and ibuprofen.  MEDICATIONS:  Current Outpatient Medications  Medication Sig Dispense Refill   acetaminophen (TYLENOL) 500 MG tablet Take 2 tablets (1,000 mg total) by mouth every 6 (six) hours as needed for moderate pain or headache. 30 tablet 0   ALPRAZolam (XANAX) 0.25 MG tablet Take 1 tablet 45 minutes before MRI, then 1 tablet once there if needed. Do not drive (Patient not taking: Reported on 06/18/2022) 2 tablet 0   aspirin EC 325 MG tablet Take 325 mg by mouth in the morning. (Patient not taking: Reported on 06/27/2022)     aspirin-acetaminophen-caffeine (EXCEDRIN MIGRAINE) 250-250-65 MG tablet Take  1-2 tablets by mouth 2 (two) times daily as needed for headache or migraine. (Patient not taking: Reported on 06/18/2022)     Blood Glucose Monitoring Suppl (ACCU-CHEK AVIVA PLUS)  w/Device KIT 1 each by Does not apply route daily at 6 (six) AM. 1 kit 0   carvedilol (COREG) 25 MG tablet Take 1 tablet (25 mg total) by mouth 2 (two) times daily with a meal. 180 tablet 1   cyclobenzaprine (FLEXERIL) 10 MG tablet Take 1 tablet (10 mg total) by mouth 3 (three) times daily as needed for muscle spasms. (Patient not taking: Reported on 06/27/2022) 30 tablet 1   dexamethasone (DECADRON) 4 MG tablet Take 1 tab on day 2 and 1 tab on day 3 Take with food. 12 tablet 0   famotidine (PEPCID) 20 MG tablet TAKE 1 TABLET(20 MG) BY MOUTH TWICE DAILY 180 tablet 0   glucose blood (ACCU-CHEK AVIVA PLUS) test strip Use as instructed 100 each 12   lidocaine-prilocaine (EMLA) cream Apply to affected area once 30 g 3   ondansetron (ZOFRAN) 8 MG tablet Take 1 tab (8 mg) by mouth every 8 hrs as needed for nausea/vomiting. Start third day after doxorubicin/cyclophosphamide chemotherapy. 30 tablet 1   ONE TOUCH CLUB LANCETS MISC 1 each by Does not apply route daily. 100 each 11   prochlorperazine (COMPAZINE) 10 MG tablet Take 1 tablet (10 mg total) by mouth every 6 (six) hours as needed for nausea or vomiting. 30 tablet 1   Semaglutide,0.25 or 0.5MG /DOS, (OZEMPIC, 0.25 OR 0.5 MG/DOSE,) 2 MG/1.5ML SOPN Inject 0.25 mg into the skin once a week. Start with 0.25MG  once a week x 4 weeks, then increase to 0.5MG  weekly. 1.5 mL 3   sertraline (ZOLOFT) 25 MG tablet Take 1 tablet (25 mg total) by mouth daily. (Patient not taking: Reported on 06/18/2022) 30 tablet 1   No current facility-administered medications for this visit.    PHYSICAL EXAMINATION: ECOG PERFORMANCE STATUS: {CHL ONC ECOG [phone]}  There were no vitals filed for this visit. There were no vitals filed for this visit.  BREAST:*** No palpable masses or nodules in either right or left breasts. No palpable axillary supraclavicular or infraclavicular adenopathy no breast tenderness or nipple discharge. (exam performed in the presence of a  chaperone)  LABORATORY DATA:  I have reviewed the data as listed    Latest Ref Rng & Units 06/27/2022   10:00 AM 06/20/2022   11:30 AM 06/06/2022   12:22 PM  CMP  Glucose 70 - 99 mg/dL 149  178  95   BUN 6 - 20 mg/dL 12  16  14    Creatinine 0.44 - 1.00 mg/dL 0.67  0.64  0.82   Sodium 135 - 145 mmol/L 134  135  137   Potassium 3.5 - 5.1 mmol/L 4.4  4.2  4.2   Chloride 98 - 111 mmol/L 100  105  102   CO2 22 - 32 mmol/L 28  23  29    Calcium 8.9 - 10.3 mg/dL 8.7  8.9  9.2   Total Protein 6.5 - 8.1 g/dL 7.1  7.0  7.7   Total Bilirubin 0.3 - 1.2 mg/dL 0.5  0.3  0.4   Alkaline Phos 38 - 126 U/L 70  54  55   AST 15 - 41 U/L 10  12  18    ALT 0 - 44 U/L 12  16  20      Lab Results  Component Value Date   WBC 2.7 (L)  06/27/2022   HGB 11.9 (L) 06/27/2022   HCT 34.8 (L) 06/27/2022   MCV 77.5 (L) 06/27/2022   PLT 205 06/27/2022   NEUTROABS 1.4 (L) 06/27/2022    ASSESSMENT & PLAN:  No problem-specific Assessment & Plan notes found for this encounter.    No orders of the defined types were placed in this encounter.  The patient has a good understanding of the overall plan. she agrees with it. she will call with any problems that may develop before the next visit here. Total time spent: 30 mins including face to face time and time spent for planning, charting and co-ordination of care   Suzzette Righter, Bud 06/27/22    I Gardiner Coins am acting as a Education administrator for [employer]  ***

## 2022-06-27 NOTE — Assessment & Plan Note (Signed)
Lynn [name] is a 37 year old woman with stage IIIB ER/PR positive breast cancer diagnosed in 06/2022 who just began neoadjuvant chemotherapy last week.    Treatment Plan:  1. Neoadjuvant chemotherapy with Adriamycin and Cytoxan dose dense 4 followed by Taxol weekly 12  2. Staging Scans with CT c/a/p and bone scan 3. Followed by breast conserving surgery with targeted axillary dissection 4. Followed by adjuvant radiation therapy 5.  Followed by antiestrogen therapy  Lynn [name] tolerated her first cycle of adriamcyin and cytoxan without difficulty. We reviewed her scans which show no evidence of cancer outside the breast and lymph nodes. I ordered a transvaginal ultrasound to be completed in the next few weeks.    Lynn [name] will return in 1 week for labs, f/u, and cycle 2 of treatment.

## 2022-06-27 NOTE — Progress Notes (Signed)
[facility] Follow up:    Lynn Dancer, NP [place] [address]   DIAGNOSIS:  Cancer Staging  Malignant neoplasm of overlapping sites of left breast in female, estrogen receptor positive ([place]) Staging form: Breast, AJCC 8th Edition - Clinical stage from 06/04/2022: Stage IIB (cT2, cN1(f), cM0, G3, ER+, PR+, HER2-) - Signed by Hayden Pedro, PA-C on 06/04/2022 Stage prefix: Initial diagnosis Method of lymph node assessment: Core biopsy Histologic grading system: 3 grade system - Clinical: Stage IIIB (cT3, cN3, cM0, G3, ER+, PR+, HER2-) - Signed by Gardenia Phlegm, NP on 06/15/2022 Stage prefix: Initial diagnosis Histologic grading system: 3 grade system   SUMMARY OF ONCOLOGIC HISTORY: Oncology History  Malignant neoplasm of overlapping sites of left breast in female, estrogen receptor positive ([place])  05/29/2022 Initial Diagnosis   Palpable abnormalities in the left breast led to mammograms which revealed 3 masses 0.7 cm at 1 o'clock position, 3 cm at 2 o'clock position, additional nodule 8 mm total span 5 cm, 5 abnormal axillary lymph nodes biopsy of the masses and the lymph node were positive for grade 3 IDC with high-grade DCIS with LVI, ER 95%, PR 40%, Ki-67 60%, HER2 2+ by IHC FISH negative   06/15/2022 Cancer Staging   Staging form: Breast, AJCC 8th Edition - Clinical: Stage IIIB (cT3, cN3, cM0, G3, ER+, PR+, HER2-) - Signed by Gardenia Phlegm, NP on 06/15/2022 Stage prefix: Initial diagnosis Histologic grading system: 3 grade system   06/15/2022 Breast MRI   IMPRESSION: 1. Multiple sites of biopsy proven malignancy with in the lateral left breast. The overall conglomerate of suspicious findings spans approximately 4.4 x 3.5 x 3.5 cm, and contains all 3 post biopsy marking clips. 2. 10+ morphologically abnormal level I, II, III and Rotter's lymph nodes within the left axilla. 3. Diffuse skin and trabecular  thickening on the left, consistent with lymphovascular spread of disease. 4. No MRI evidence of malignancy on the right.   06/20/2022 -  Chemotherapy   Patient is on Treatment Plan : BREAST ADJUVANT DOSE DENSE AC q14d / PACLitaxel q7d      Genetic Testing   [facility] Multi-Cancer Panel+RNA was Negative. Report date is 06/13/2022.  The Multi-Cancer + RNA Panel offered by [facility] includes sequencing and/or deletion/duplication analysis of the following 70 genes:  AIP*, ALK, APC*, ATM*, AXIN2*, BAP1*, BARD1*, BLM*, BMPR1A*, BRCA1*, BRCA2*, BRIP1*, CDC73*, CDH1*, CDK4, CDKN1B*, CDKN2A, CHEK2*, CTNNA1*, DICER1*, EPCAM (del/dup only), EGFR, FH*, FLCN*, GREM1 (promoter dup only), HOXB13, KIT, LZTR1, MAX*, MBD4, MEN1*, MET, MITF, MLH1*, MSH2*, MSH3*, MSH6*, MUTYH*, NF1*, NF2*, NTHL1*, PALB2*, PDGFRA, PMS2*, POLD1*, POLE*, POT1*, PRKAR1A*, PTCH1*, PTEN*, RAD51C*, RAD51D*, RB1*, RET, SDHA* (sequencing only), SDHAF2*, SDHB*, SDHC*, SDHD*, SMAD4*, SMARCA4*, SMARCB1*, SMARCE1*, STK11*, SUFU*, TMEM127*, TP53*, TSC1*, TSC2*, VHL*. RNA analysis is performed for * genes.     CURRENT THERAPY: Adriamycin/Cytoxan  INTERVAL HISTORY: Lynn [name] 37 y.o. female returns for f/u after receiving her first cycle of neoadjuvant chemotherapy one week ago. She tells me that she tolerated it moderately well. She had two episodes of feeling nauseated, and some mild fatigue, but otherwise tolerated it well.    She underwent CT C/A/P yesterday that demonstrated no evidence of cancer outside of her left breast and lymph nodes.  It did note an adnexal lesion questionably a cyst and transvaginal ultrasound was recommended.    Patient Active Problem List   Diagnosis Date Noted   Genetic testing 06/15/2022   Anxiety and  depression 06/11/2022   Malignant neoplasm of overlapping sites of left breast in female, estrogen receptor positive ([place]) 06/04/2022   Syncope and collapse [id number]   Umbilical hernia without obstruction and  without gangrene 03/12/2022   Vitamin D deficiency 03/12/2022   Cervical radicular pain 12/07/2021   Snoring 12/07/2021   Other fatigue 12/07/2021   Chest pain 08/15/2021   Epigastric pain 08/15/2021   Acute pain of both shoulders 04/21/2021   Nerve pain 04/21/2021   Morbid obesity ([place]) 10/13/2020   Essential hypertension 03/12/2019   Chronic tachycardia 03/12/2019   Generalized anxiety disorder with panic attacks 03/12/2019   Family history of heart attack 03/12/2019   LVH (left ventricular hypertrophy) due to hypertensive disease, without heart failure 03/12/2019   Type 2 diabetes mellitus ([place]) 03/12/2019    is allergic to macrobid [nitrofurantoin macrocrystal] and ibuprofen.  MEDICAL HISTORY: Past Medical History:  Diagnosis Date   Cancer ([place] [facility]) 2024   L breast   Carpal tunnel syndrome, left    Diabetes ([facility])    Hypertension    Iron deficiency    Panic attacks    PCOS (polycystic ovarian syndrome)    Tachycardia     SURGICAL HISTORY: Past Surgical History:  Procedure Laterality Date   ADENOIDECTOMY     BREAST BIOPSY Left 05/29/2022   [place] LT BREAST BX W LOC DEV 1ST LESION IMG BX SPEC US GUIDE 05/29/2022 [facility]   BREAST BIOPSY Left 05/29/2022   [place] LT BREAST BX W LOC DEV EA ADD LESION IMG BX SPEC US GUIDE 05/29/2022 [facility]   BREAST BIOPSY Left 06/06/2022   MM LT BREAST BX W LOC DEV 1ST LESION IMAGE BX SPEC STEREO GUIDE 06/06/2022 [facility]   CESAREAN SECTION     PORTACATH PLACEMENT N/A 06/19/2022   Procedure: INSERTION PORT-A-CATH;  Surgeon: Rolm Bookbinder, MD;  Location: [facility];  Service: General;  Laterality: N/A;   TONSILLECTOMY      SOCIAL HISTORY: Social History   Socioeconomic History   Marital status: Significant Other    Spouse name: Not on file   Number of children: 1   Years of education: some college   Highest education level: Not on file  Occupational History   Not on file  Tobacco Use   Smoking status: Former     Packs/day: 1.00    Years: 7.00    Additional pack years: 0.00    Total pack years: 7.00    Types: Cigarettes, E-cigarettes    Quit date: 10/29/2021    Years since quitting: 0.6   Smokeless tobacco: Never  Vaping Use   Vaping Use: Former  Substance and Sexual Activity   Alcohol use: Yes    Comment: rarely   Drug use: No   Sexual activity: Yes    Birth control/protection: Condom  Other Topics Concern   Not on file  Social History Narrative      Social Determinants of Health   Financial Resource Strain: Not on file  Food Insecurity: Not on file  Transportation Needs: Not on file  Physical Activity: Not on file  Stress: Not on file  Social Connections: Not on file  Intimate Partner Violence: Not on file    FAMILY HISTORY: Family History  Problem Relation Age of Onset   Heart disease Mother    Diabetes Mother    Hypertension Mother    Kidney disease Mother    Heart failure Mother    Heart attack Mother 45   Arthritis Mother  Asthma Mother    COPD Mother    Depression Mother    Hyperlipidemia Mother    Mental illness Mother    Bladder Cancer Mother    Heart disease Father    Heart attack Father 51   COPD Father    Hyperlipidemia Father    Hypertension Father    Mental illness Father    Arthritis Sister    Depression Sister    Diabetes Sister    Hypertension Sister    Mental illness Sister    69 / Stillbirths Sister    Alcohol abuse Sister    Depression Sister    Diabetes Sister    Heart disease Sister    Heart attack Sister 33   Arthritis Sister    Kidney disease Sister    Transient ischemic attack Sister    Bladder Cancer Maternal Aunt    Colon cancer Maternal Aunt 63 - 46   Other Maternal Aunt 50 - 1       brain tumor (unknown if benign or malignant)   Lung cancer Paternal Aunt 47   Melanoma Paternal Aunt 76 - 49   Heart failure Maternal Grandmother    Arthritis Maternal Grandmother    Alcohol abuse Maternal Grandmother    COPD  Maternal Grandmother    Diabetes Maternal Grandmother    Hearing loss Maternal Grandmother    Heart disease Maternal Grandmother    Hyperlipidemia Maternal Grandmother    Hypertension Maternal Grandmother    Stroke Maternal Grandmother    Miscarriages / Stillbirths Maternal Grandmother    Mental illness Maternal Grandmother    Heart attack Maternal Grandmother    Heart failure Maternal Grandfather    Alcohol abuse Maternal Grandfather    Arthritis Maternal Grandfather    Early death Maternal Grandfather    Heart disease Maternal Grandfather    Hyperlipidemia Maternal Grandfather    Hypertension Maternal Grandfather    Heart attack Maternal Grandfather 60   Mental illness Maternal Grandfather    Heart failure Paternal Grandmother    Arthritis Paternal Grandmother    Diabetes Paternal Grandmother    Heart attack Paternal 58 / Stillbirths Paternal Grandmother    Mental illness Paternal Grandmother    Kidney disease Paternal Grandmother    Intellectual disability Paternal Grandmother    Hypertension Paternal Grandmother    Hyperlipidemia Paternal Grandmother    Heart disease Paternal Grandmother    Heart failure Paternal Grandfather    Arthritis Paternal Grandfather    Heart attack Paternal Grandfather    Mental illness Paternal Grandfather    Hearing loss Paternal Grandfather    Heart disease Paternal Grandfather    Hyperlipidemia Paternal Grandfather    Hypertension Paternal Grandfather     Review of Systems  Constitutional:  Positive for fatigue. Negative for appetite change, chills, fever and unexpected weight change.  HENT:   Negative for hearing loss, lump/mass and trouble swallowing.   Eyes:  Negative for eye problems and icterus.  Respiratory:  Negative for chest tightness, cough and shortness of breath.   Cardiovascular:  Negative for chest pain, leg swelling and palpitations.  Gastrointestinal:  Positive for nausea. Negative for abdominal  distention, abdominal pain, constipation, diarrhea and vomiting.  Endocrine: Negative for hot flashes.  Genitourinary:  Negative for difficulty urinating.   Musculoskeletal:  Negative for arthralgias.  Skin:  Negative for itching and rash.  Neurological:  Negative for dizziness, extremity weakness, headaches and numbness.  Hematological:  Negative for adenopathy. Does not  bruise/bleed easily.  Psychiatric/Behavioral:  Negative for depression. The patient is not nervous/anxious.       PHYSICAL EXAMINATION  ECOG PERFORMANCE STATUS: 1 - Symptomatic but completely ambulatory  Vitals:   06/27/22 1030  BP: 133/81  Pulse: 88  Resp: 18  Temp: 97.7 F (36.5 C)  SpO2: 97%    Physical Exam Constitutional:      General: She is not in acute distress.    Appearance: Normal appearance. She is not toxic-appearing.  HENT:     Head: Normocephalic and atraumatic.     Mouth/Throat:     Mouth: Mucous membranes are moist.     Pharynx: Oropharynx is clear. No oropharyngeal exudate or posterior oropharyngeal erythema.  Eyes:     General: No scleral icterus. Cardiovascular:     Rate and Rhythm: Normal rate and regular rhythm.     Pulses: Normal pulses.     Heart sounds: Normal heart sounds.  Pulmonary:     Effort: Pulmonary effort is normal.     Breath sounds: Normal breath sounds.  Abdominal:     General: Abdomen is flat. Bowel sounds are normal. There is no distension.     Palpations: Abdomen is soft.     Tenderness: There is no abdominal tenderness.  Musculoskeletal:        General: No swelling.     Cervical back: Neck supple.  Lymphadenopathy:     Cervical: No cervical adenopathy.  Skin:    General: Skin is warm and dry.     Findings: No rash.  Neurological:     General: No focal deficit present.     Mental Status: She is alert.  Psychiatric:        Mood and Affect: Mood normal.        Behavior: Behavior normal.     LABORATORY DATA:  CBC    Component Value Date/Time    WBC 2.7 (L) 06/27/2022 1000   WBC 8.0 02/14/2022 1027   RBC 4.49 06/27/2022 1000   HGB 11.9 (L) 06/27/2022 1000   HCT 34.8 (L) 06/27/2022 1000   PLT 205 06/27/2022 1000   MCV 77.5 (L) 06/27/2022 1000   MCH 26.5 06/27/2022 1000   MCHC 34.2 06/27/2022 1000   RDW 12.6 06/27/2022 1000   LYMPHSABS 0.8 06/27/2022 1000   MONOABS 0.2 06/27/2022 1000   EOSABS 0.2 06/27/2022 1000   BASOSABS 0.1 06/27/2022 1000    CMP     Component Value Date/Time   NA 134 (L) 06/27/2022 1000   K 4.4 06/27/2022 1000   CL 100 06/27/2022 1000   CO2 28 06/27/2022 1000   GLUCOSE 149 (H) 06/27/2022 1000   BUN 12 06/27/2022 1000   CREATININE 0.67 06/27/2022 1000   CALCIUM 8.7 (L) 06/27/2022 1000   PROT 7.1 06/27/2022 1000   ALBUMIN 3.9 06/27/2022 1000   AST 10 (L) 06/27/2022 1000   ALT 12 06/27/2022 1000   ALKPHOS 70 06/27/2022 1000   BILITOT 0.5 06/27/2022 1000   GFRNONAA >60 06/27/2022 1000   GFRAA >60 01/02/2020 2048    RADIOGRAPHIC STUDIES:  CT Chest W Contrast  Result Date: 06/27/2022 CLINICAL DATA:  Breast cancer staging. Left-sided. One round of chemotherapy. * Tracking Code: BO * EXAM: CT CHEST, ABDOMEN, AND PELVIS WITH CONTRAST TECHNIQUE: Multidetector CT imaging of the chest, abdomen and pelvis was performed following the standard protocol during bolus administration of intravenous contrast. RADIATION DOSE REDUCTION: This exam was performed according to the departmental dose-optimization program which includes automated exposure  control, adjustment of the mA and/or kV according to patient size and/or use of iterative reconstruction technique. CONTRAST:  136mL ISOVUE-300 IOPAMIDOL (ISOVUE-300) INJECTION 61% COMPARISON:  Old CT scan 12/14/2013 of the abdomen and pelvis. CT angiogram 11/25/2016. Bone scan 06/14/2022 FINDINGS: CT CHEST FINDINGS Cardiovascular: Right upper chest port. The heart is nonenlarged. Trace pericardial effusion. The thoracic aorta has a normal course and caliber.  Mediastinum/Nodes: Preserved thyroid gland. Slightly patulous thoracic esophagus. There are 2 clearly abnormal left axillary nodes. Largest is seen on series 2, image 19 measuring 2.8 by 2.0 cm. Second node just deep to the lateral margin of the pectoralis minor muscle on series 2, image 16 measures 2.4 by 1.7 cm. Few other suspicious and small nodes identified along the left chest wall and axillary region. No specific abnormal lymph node enlargement identified in the right axillary region, mediastinum, hilum, internal mammary chain or supraclavicular. Lungs/Pleura: No consolidation, pneumothorax or effusion. No dominant nodule. Slight breathing motion. Musculoskeletal: Mild degenerative changes of the thoracic spine. There is multilevel Schmorl's node change. 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 There is some skin thickening along the left breast. There is a spiculated nodule in the left breast on series 2, image 31 measuring 2.5 by 1.9 cm. Please correlate with location of the patient's neoplasm. CT ABDOMEN PELVIS FINDINGS Hepatobiliary: No space-occupying liver lesion. Fatty liver infiltration. Patent portal vein. Gallbladder is nondilated. Pancreas: Unremarkable. No pancreatic ductal dilatation or surrounding inflammatory changes. Spleen: Preserved enhancement but the spleen is enlarged measuring up to 16.1 cm on sagittal series 6, image 183. Adrenals/Urinary Tract: Adrenal glands are unremarkable. Kidneys are normal, without renal calculi, focal lesion, or hydronephrosis. Bladder is unremarkable. Stomach/Bowel: The large bowel has a normal course and caliber. Moderate colonic stool. Normal retrocecal appendix. Stomach and small bowel are nondilated. Proximal third portion duodenal diverticula identified. Vascular/Lymphatic: No significant vascular findings are present. No enlarged abdominal or pelvic lymph nodes. Reproductive: On coronal image 105 of series 5 is a left-sided 4.0 x 2.0 cm cystic lesion  along the margin of the left ovary. This could be related to the ovary, paraovarian or fallopian. Uterus is preserved. Other: Moderate midline anterior abdominal wall fat containing hernia at the umbilicus. No free air or free fluid. Musculoskeletal: Slight degenerative changes of the spine and pelvis. Please correlate with separate bone scan. IMPRESSION: Left-sided breast skin thickening with the small spiculated nodule. Please correlate for location of primary neoplasm. There are at least 2 pathologic left axillary nodes with some smaller but suspicious left-sided chest wall nodes nodal metastases. No other areas of abnormal lymph node enlargement in the chest, abdomen and pelvis. Mild fatty liver infiltration.  Mild splenomegaly. Left adnexal cystic lesion measuring 4.0 x 2.0 cm. This could be ovarian or paraovarian. Please correlate for any known history or dedicated pelvic ultrasound when appropriate. Reference: JACR 2020 Feb; 123XX123 Moderate umbilical fat containing hernia. Electronically Signed   By: Jill Side M.D.   On: 06/27/2022 10:57   CT Abdomen Pelvis W Contrast  Result Date: 06/27/2022 CLINICAL DATA:  Breast cancer staging. Left-sided. One round of chemotherapy. * Tracking Code: BO * EXAM: CT CHEST, ABDOMEN, AND PELVIS WITH CONTRAST TECHNIQUE: Multidetector CT imaging of the chest, abdomen and pelvis was performed following the standard protocol during bolus administration of intravenous contrast. RADIATION DOSE REDUCTION: This exam was performed according to the departmental dose-optimization program which includes automated exposure control, adjustment of the mA and/or kV according to patient size and/or use of iterative  reconstruction technique. CONTRAST:  155mL ISOVUE-300 IOPAMIDOL (ISOVUE-300) INJECTION 61% COMPARISON:  Old CT scan 12/14/2013 of the abdomen and pelvis. CT angiogram 11/25/2016. Bone scan 06/14/2022 FINDINGS: CT CHEST FINDINGS Cardiovascular: Right upper chest port.  The heart is nonenlarged. Trace pericardial effusion. The thoracic aorta has a normal course and caliber. Mediastinum/Nodes: Preserved thyroid gland. Slightly patulous thoracic esophagus. There are 2 clearly abnormal left axillary nodes. Largest is seen on series 2, image 19 measuring 2.8 by 2.0 cm. Second node just deep to the lateral margin of the pectoralis minor muscle on series 2, image 16 measures 2.4 by 1.7 cm. Few other suspicious and small nodes identified along the left chest wall and axillary region. No specific abnormal lymph node enlargement identified in the right axillary region, mediastinum, hilum, internal mammary chain or supraclavicular. Lungs/Pleura: No consolidation, pneumothorax or effusion. No dominant nodule. Slight breathing motion. Musculoskeletal: Mild degenerative changes of the thoracic spine. There is multilevel Schmorl's node change. 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 There is some skin thickening along the left breast. There is a spiculated nodule in the left breast on series 2, image 31 measuring 2.5 by 1.9 cm. Please correlate with location of the patient's neoplasm. CT ABDOMEN PELVIS FINDINGS Hepatobiliary: No space-occupying liver lesion. Fatty liver infiltration. Patent portal vein. Gallbladder is nondilated. Pancreas: Unremarkable. No pancreatic ductal dilatation or surrounding inflammatory changes. Spleen: Preserved enhancement but the spleen is enlarged measuring up to 16.1 cm on sagittal series 6, image 183. Adrenals/Urinary Tract: Adrenal glands are unremarkable. Kidneys are normal, without renal calculi, focal lesion, or hydronephrosis. Bladder is unremarkable. Stomach/Bowel: The large bowel has a normal course and caliber. Moderate colonic stool. Normal retrocecal appendix. Stomach and small bowel are nondilated. Proximal third portion duodenal diverticula identified. Vascular/Lymphatic: No significant vascular findings are present. No enlarged abdominal or pelvic  lymph nodes. Reproductive: On coronal image 105 of series 5 is a left-sided 4.0 x 2.0 cm cystic lesion along the margin of the left ovary. This could be related to the ovary, paraovarian or fallopian. Uterus is preserved. Other: Moderate midline anterior abdominal wall fat containing hernia at the umbilicus. No free air or free fluid. Musculoskeletal: Slight degenerative changes of the spine and pelvis. Please correlate with separate bone scan. IMPRESSION: Left-sided breast skin thickening with the small spiculated nodule. Please correlate for location of primary neoplasm. There are at least 2 pathologic left axillary nodes with some smaller but suspicious left-sided chest wall nodes nodal metastases. No other areas of abnormal lymph node enlargement in the chest, abdomen and pelvis. Mild fatty liver infiltration.  Mild splenomegaly. Left adnexal cystic lesion measuring 4.0 x 2.0 cm. This could be ovarian or paraovarian. Please correlate for any known history or dedicated pelvic ultrasound when appropriate. Reference: JACR 2020 Feb; 123XX123 Moderate umbilical fat containing hernia. Electronically Signed   By: Jill Side M.D.   On: 06/27/2022 10:57      ASSESSMENT and THERAPY PLAN:   Malignant neoplasm of overlapping sites of left breast in female, estrogen receptor positive ([place]) Lynn [name] is a 37 year old woman with stage IIIB ER/PR positive breast cancer diagnosed in 06/2022 who just began neoadjuvant chemotherapy last week.    Treatment Plan:  1. Neoadjuvant chemotherapy with Adriamycin and Cytoxan dose dense 4 followed by Taxol weekly 12  2. Staging Scans with CT c/a/p and bone scan 3. Followed by breast conserving surgery with targeted axillary dissection 4. Followed by adjuvant radiation therapy 5.  Followed by antiestrogen therapy  Lynn [name]  tolerated her first cycle of adriamcyin and cytoxan without difficulty. We reviewed her scans which show no evidence of cancer outside the breast and  lymph nodes. I ordered a transvaginal ultrasound to be completed in the next few weeks.    Lynn [name] will return in 1 week for labs, f/u, and cycle 2 of treatment.     All questions were answered. The patient knows to call the clinic with any problems, questions or concerns. We can certainly see the patient much sooner if necessary.  Total encounter time:20 minutes*in face-to-face visit time, chart review, lab review, care coordination, order entry, and documentation of the encounter time.  Wilber Bihari, NP 06/27/22 7:17 PM Medical Oncology and Hematology [facility] [place], [place] [address] Tel. [phone]    Fax. [phone]  *Total Encounter Time as defined by the Centers for Medicare and Medicaid Services includes, in addition to the face-to-face time of a patient visit (documented in the note above) non-face-to-face time: obtaining and reviewing outside history, ordering and reviewing medications, tests or procedures, care coordination (communications with other health care professionals or caregivers) and documentation in the medical record.

## 2022-07-02 MED FILL — Dexamethasone Sodium Phosphate Inj 100 MG/10ML: INTRAMUSCULAR | Qty: 1 | Status: AC

## 2022-07-02 MED FILL — Fosaprepitant Dimeglumine For IV Infusion 150 MG (Base Eq): INTRAVENOUS | Qty: 5 | Status: AC

## 2022-07-03 ENCOUNTER — Inpatient Hospital Stay: Payer: Managed Care, Other (non HMO) | Attending: Hematology and Oncology

## 2022-07-03 ENCOUNTER — Inpatient Hospital Stay: Payer: Managed Care, Other (non HMO)

## 2022-07-03 ENCOUNTER — Inpatient Hospital Stay (HOSPITAL_BASED_OUTPATIENT_CLINIC_OR_DEPARTMENT_OTHER): Payer: Managed Care, Other (non HMO) | Admitting: Hematology and Oncology

## 2022-07-03 ENCOUNTER — Other Ambulatory Visit: Payer: Self-pay

## 2022-07-03 VITALS — BP 133/81 | HR 95 | Temp 98.3°F | Resp 14

## 2022-07-03 VITALS — BP 158/82 | HR 101 | Temp 97.2°F | Resp 18 | Ht 67.0 in | Wt 319.0 lb

## 2022-07-03 DIAGNOSIS — R5383 Other fatigue: Secondary | ICD-10-CM | POA: Diagnosis not present

## 2022-07-03 DIAGNOSIS — Z5111 Encounter for antineoplastic chemotherapy: Secondary | ICD-10-CM | POA: Insufficient documentation

## 2022-07-03 DIAGNOSIS — I10 Essential (primary) hypertension: Secondary | ICD-10-CM | POA: Diagnosis not present

## 2022-07-03 DIAGNOSIS — R110 Nausea: Secondary | ICD-10-CM | POA: Insufficient documentation

## 2022-07-03 DIAGNOSIS — T451X5A Adverse effect of antineoplastic and immunosuppressive drugs, initial encounter: Secondary | ICD-10-CM | POA: Insufficient documentation

## 2022-07-03 DIAGNOSIS — D6481 Anemia due to antineoplastic chemotherapy: Secondary | ICD-10-CM | POA: Insufficient documentation

## 2022-07-03 DIAGNOSIS — E119 Type 2 diabetes mellitus without complications: Secondary | ICD-10-CM | POA: Diagnosis not present

## 2022-07-03 DIAGNOSIS — Z5189 Encounter for other specified aftercare: Secondary | ICD-10-CM | POA: Diagnosis not present

## 2022-07-03 DIAGNOSIS — L659 Nonscarring hair loss, unspecified: Secondary | ICD-10-CM | POA: Insufficient documentation

## 2022-07-03 DIAGNOSIS — R21 Rash and other nonspecific skin eruption: Secondary | ICD-10-CM | POA: Diagnosis not present

## 2022-07-03 DIAGNOSIS — Z95828 Presence of other vascular implants and grafts: Secondary | ICD-10-CM

## 2022-07-03 DIAGNOSIS — C50812 Malignant neoplasm of overlapping sites of left female breast: Secondary | ICD-10-CM

## 2022-07-03 DIAGNOSIS — K1230 Oral mucositis (ulcerative), unspecified: Secondary | ICD-10-CM | POA: Diagnosis not present

## 2022-07-03 DIAGNOSIS — Z79899 Other long term (current) drug therapy: Secondary | ICD-10-CM | POA: Insufficient documentation

## 2022-07-03 DIAGNOSIS — Z170 Estrogen receptor positive status [ER+]: Secondary | ICD-10-CM | POA: Insufficient documentation

## 2022-07-03 DIAGNOSIS — K5900 Constipation, unspecified: Secondary | ICD-10-CM | POA: Diagnosis not present

## 2022-07-03 LAB — CBC WITH DIFFERENTIAL (CANCER CENTER ONLY)
Abs Immature Granulocytes: 0.35 10*3/uL — ABNORMAL HIGH (ref 0.00–0.07)
Basophils Absolute: 0.1 10*3/uL (ref 0.0–0.1)
Basophils Relative: 1 %
Eosinophils Absolute: 0 10*3/uL (ref 0.0–0.5)
Eosinophils Relative: 1 %
HCT: 37.3 % (ref 36.0–46.0)
Hemoglobin: 12.3 g/dL (ref 12.0–15.0)
Immature Granulocytes: 5 %
Lymphocytes Relative: 19 %
Lymphs Abs: 1.3 10*3/uL (ref 0.7–4.0)
MCH: 26.1 pg (ref 26.0–34.0)
MCHC: 33 g/dL (ref 30.0–36.0)
MCV: 79 fL — ABNORMAL LOW (ref 80.0–100.0)
Monocytes Absolute: 1 10*3/uL (ref 0.1–1.0)
Monocytes Relative: 15 %
Neutro Abs: 4.1 10*3/uL (ref 1.7–7.7)
Neutrophils Relative %: 59 %
Platelet Count: 206 10*3/uL (ref 150–400)
RBC: 4.72 MIL/uL (ref 3.87–5.11)
RDW: 13 % (ref 11.5–15.5)
WBC Count: 6.8 10*3/uL (ref 4.0–10.5)
nRBC: 0 % (ref 0.0–0.2)

## 2022-07-03 LAB — CMP (CANCER CENTER ONLY)
ALT: 19 U/L (ref 0–44)
AST: 14 U/L — ABNORMAL LOW (ref 15–41)
Albumin: 3.9 g/dL (ref 3.5–5.0)
Alkaline Phosphatase: 68 U/L (ref 38–126)
Anion gap: 7 (ref 5–15)
BUN: 17 mg/dL (ref 6–20)
CO2: 27 mmol/L (ref 22–32)
Calcium: 9.2 mg/dL (ref 8.9–10.3)
Chloride: 101 mmol/L (ref 98–111)
Creatinine: 0.72 mg/dL (ref 0.44–1.00)
GFR, Estimated: >60 mL/min (ref >60)
Glucose, Bld: 176 mg/dL — ABNORMAL HIGH (ref 70–99)
Potassium: 4.1 mmol/L (ref 3.5–5.1)
Sodium: 135 mmol/L (ref 135–145)
Total Bilirubin: 0.3 mg/dL (ref 0.3–1.2)
Total Protein: 7.1 g/dL (ref 6.5–8.1)

## 2022-07-03 LAB — PREGNANCY, URINE: Preg Test, Ur: NEGATIVE

## 2022-07-03 MED ORDER — SODIUM CHLORIDE 0.9% FLUSH
10.0000 mL | INTRAVENOUS | Status: AC | PRN
  Administered 2022-07-03: 10 mL

## 2022-07-03 MED ORDER — DOXORUBICIN HCL CHEMO IV INJECTION 2 MG/ML
60.0000 mg/m2 | Freq: Once | INTRAVENOUS | Status: AC
  Administered 2022-07-03: 158 mg via INTRAVENOUS
  Filled 2022-07-03: qty 79

## 2022-07-03 MED ORDER — PALONOSETRON HCL INJECTION 0.25 MG/5ML
0.2500 mg | Freq: Once | INTRAVENOUS | Status: AC
  Administered 2022-07-03: 0.25 mg via INTRAVENOUS
  Filled 2022-07-03: qty 5

## 2022-07-03 MED ORDER — HEPARIN SOD (PORK) LOCK FLUSH 100 UNIT/ML IV SOLN
500.0000 [IU] | Freq: Once | INTRAVENOUS | Status: AC | PRN
  Administered 2022-07-03: 500 [IU]

## 2022-07-03 MED ORDER — SODIUM CHLORIDE 0.9 % IV SOLN
10.0000 mg | Freq: Once | INTRAVENOUS | Status: AC
  Administered 2022-07-03: 10 mg via INTRAVENOUS
  Filled 2022-07-03: qty 10

## 2022-07-03 MED ORDER — SODIUM CHLORIDE 0.9 % IV SOLN
600.0000 mg/m2 | Freq: Once | INTRAVENOUS | Status: AC
  Administered 2022-07-03: 1500 mg via INTRAVENOUS
  Filled 2022-07-03: qty 75

## 2022-07-03 MED ORDER — SODIUM CHLORIDE 0.9 % IV SOLN: Freq: Once | INTRAVENOUS | Status: AC

## 2022-07-03 MED ORDER — SODIUM CHLORIDE 0.9 % IV SOLN
150.0000 mg | Freq: Once | INTRAVENOUS | Status: AC
  Administered 2022-07-03: 150 mg via INTRAVENOUS
  Filled 2022-07-03: qty 150

## 2022-07-03 MED ORDER — SODIUM CHLORIDE 0.9% FLUSH
10.0000 mL | INTRAVENOUS | Status: DC | PRN
  Administered 2022-07-03: 10 mL

## 2022-07-03 NOTE — Patient Instructions (Signed)
[facility]  Discharge Instructions: Thank you for choosing [facility] to provide your oncology and hematology care.   If you have a lab appointment with the [facility], please go directly to the [facility] and check in at the registration area.   Wear comfortable clothing and clothing appropriate for easy access to any Portacath or PICC line.   We strive to give you quality time with your provider. You may need to reschedule your appointment if you arrive late (15 or more minutes).  Arriving late affects you and other patients whose appointments are after yours.  Also, if you miss three or more appointments without notifying the office, you may be dismissed from the clinic at the provider's discretion.      For prescription refill requests, have your pharmacy contact our office and allow 72 hours for refills to be completed.    Today you received the following chemotherapy and/or immunotherapy agents: Doxorubicin, Cyclophosphamide      To help prevent nausea and vomiting after your treatment, we encourage you to take your nausea medication as directed.  BELOW ARE SYMPTOMS THAT SHOULD BE REPORTED IMMEDIATELY: *FEVER GREATER THAN 100.4 F (38 C) OR HIGHER *CHILLS OR SWEATING *NAUSEA AND VOMITING THAT IS NOT CONTROLLED WITH YOUR NAUSEA MEDICATION *UNUSUAL SHORTNESS OF BREATH *UNUSUAL BRUISING OR BLEEDING *URINARY PROBLEMS (pain or burning when urinating, or frequent urination) *BOWEL PROBLEMS (unusual diarrhea, constipation, pain near the anus) TENDERNESS IN MOUTH AND THROAT WITH OR WITHOUT PRESENCE OF ULCERS (sore throat, sores in mouth, or a toothache) UNUSUAL RASH, SWELLING OR PAIN  UNUSUAL VAGINAL DISCHARGE OR ITCHING   Items with * indicate a potential emergency and should be followed up as soon as possible or go to the Emergency Department if any problems should occur.  Please show the CHEMOTHERAPY ALERT CARD or IMMUNOTHERAPY  ALERT CARD at check-in to the Emergency Department and triage nurse.  Should you have questions after your visit or need to cancel or reschedule your appointment, please contact [facility]  Dept: [phone]  and follow the prompts.  Office hours are 8:00 a.m. to 4:30 p.m. Monday - Friday. Please note that voicemails left after 4:00 p.m. may not be returned until the following business day.  We are closed weekends and major holidays. You have access to a nurse at all times for urgent questions. Please call the main number to the clinic Dept: [phone] and follow the prompts.   For any non-urgent questions, you may also contact your provider using MyChart. We now offer e-Visits for anyone 21 and older to request care online for non-urgent symptoms. For details visit [email or web address].   Also download the MyChart app! Go to the app store, search "MyChart", open the app, select [facility], and log in with your MyChart username and password.

## 2022-07-03 NOTE — Assessment & Plan Note (Signed)
05/29/2022:Palpable abnormalities in the left breast led to mammograms which revealed 3 masses 0.7 cm at 1 o'clock position, 3 cm at 2 o'clock position, additional nodule 8 mm total span 5 cm, 5 abnormal axillary lymph nodes biopsy of the masses and the lymph node were positive for grade 3 IDC with high-grade DCIS with LVI, ER 95%, PR 40%, Ki-67 60%, HER2 2+ by IHC FISH negative  06/27/2022: CT CAP: No distant metastatic disease, left adnexal cystic lesion 4 cm (pelvic ultrasound ordered)  Treatment Plan: 1. Neoadjuvant chemotherapy with Adriamycin and Cytoxan dose dense 4 followed by Taxol weekly 12  2. Followed by breast conserving surgery with targeted axillary dissection 3. Followed by adjuvant radiation therapy 4.  Followed by antiestrogen therapy ----------------------------------------------------------------------------------------------------------------------------------------------- Current treatment: Cycle 2 Adriamycin and Cytoxan Chemo toxicities:   Return to clinic in 2 weeks for cycle 3

## 2022-07-04 ENCOUNTER — Ambulatory Visit (HOSPITAL_COMMUNITY)
Admission: RE | Admit: 2022-07-04 | Discharge: 2022-07-04 | Disposition: A | Discharge status: Home / Self Care | Payer: Managed Care, Other (non HMO) | Source: Ambulatory Visit | Attending: Adult Health | Admitting: Adult Health

## 2022-07-04 DIAGNOSIS — C50812 Malignant neoplasm of overlapping sites of left female breast: Secondary | ICD-10-CM | POA: Insufficient documentation

## 2022-07-04 DIAGNOSIS — Z170 Estrogen receptor positive status [ER+]: Secondary | ICD-10-CM

## 2022-07-04 DIAGNOSIS — N949 Unspecified condition associated with female genital organs and menstrual cycle: Secondary | ICD-10-CM | POA: Diagnosis present

## 2022-07-05 ENCOUNTER — Ambulatory Visit: Payer: Managed Care, Other (non HMO)

## 2022-07-05 ENCOUNTER — Inpatient Hospital Stay: Payer: Managed Care, Other (non HMO)

## 2022-07-05 VITALS — BP 147/82 | HR 89 | Temp 98.3°F | Resp 20

## 2022-07-05 DIAGNOSIS — C50812 Malignant neoplasm of overlapping sites of left female breast: Secondary | ICD-10-CM | POA: Diagnosis not present

## 2022-07-05 DIAGNOSIS — Z170 Estrogen receptor positive status [ER+]: Secondary | ICD-10-CM

## 2022-07-05 MED ORDER — PEGFILGRASTIM-CBQV 6 MG/0.6ML ~~LOC~~ SOSY
6.0000 mg | PREFILLED_SYRINGE | Freq: Once | SUBCUTANEOUS | Status: AC
  Administered 2022-07-05: 6 mg via SUBCUTANEOUS
  Filled 2022-07-05: qty 0.6

## 2022-07-09 ENCOUNTER — Ambulatory Visit (HOSPITAL_BASED_OUTPATIENT_CLINIC_OR_DEPARTMENT_OTHER): Payer: Managed Care, Other (non HMO) | Admitting: Adult Health

## 2022-07-09 ENCOUNTER — Telehealth: Payer: Self-pay

## 2022-07-09 DIAGNOSIS — N9489 Other specified conditions associated with female genital organs and menstrual cycle: Secondary | ICD-10-CM

## 2022-07-09 DIAGNOSIS — C50812 Malignant neoplasm of overlapping sites of left female breast: Secondary | ICD-10-CM

## 2022-07-09 DIAGNOSIS — Z170 Estrogen receptor positive status [ER+]: Secondary | ICD-10-CM | POA: Diagnosis not present

## 2022-07-09 NOTE — Progress Notes (Signed)
[name] Follow up:    Lynn Scull, NP [address] [place] [address]   DIAGNOSIS:  Cancer Staging  Malignant neoplasm of overlapping sites of left breast in female, estrogen receptor positive Staging form: Breast, AJCC 8th Edition - Clinical stage from 06/04/2022: Stage IIB (cT2, cN1(f), cM0, G3, ER+, PR+, HER2-) - Signed by Lynn Bacon, PA-C on 06/04/2022 Stage prefix: Initial diagnosis Method of lymph node assessment: Core biopsy Histologic grading system: 3 grade system - Clinical: Stage IIIB (cT3, cN3, cM0, G3, ER+, PR+, HER2-) - Signed by Lynn Socks, NP on 06/15/2022 Stage prefix: Initial diagnosis Histologic grading system: 3 grade system  I connected with Lynn [name] on 07/09/22 at 11:45 AM EDT by telephone and verified that I am speaking with the correct person using two identifiers.  I discussed the limitations, risks, security and privacy concerns of performing an evaluation and management service by telephone and the availability of in person appointments.  I also discussed with the patient that there may be a patient responsible charge related to this service. The patient expressed understanding and agreed to proceed.  Patient location: home in private location Provider location: office at [facility] [place]  SUMMARY OF ONCOLOGIC HISTORY: Oncology History  Malignant neoplasm of overlapping sites of left breast in female, estrogen receptor positive  05/29/2022 Initial Diagnosis   Palpable abnormalities in the left breast led to mammograms which revealed 3 masses 0.7 cm at 1 o'clock position, 3 cm at 2 o'clock position, additional nodule 8 mm total span 5 cm, 5 abnormal axillary lymph nodes biopsy of the masses and the lymph node were positive for grade 3 IDC with high-grade DCIS with LVI, ER 95%, PR 40%, Ki-67 60%, HER2 2+ by IHC FISH negative   06/15/2022 Cancer Staging   Staging form: Breast, AJCC 8th Edition -  Clinical: Stage IIIB (cT3, cN3, cM0, G3, ER+, PR+, HER2-) - Signed by Lynn Socks, NP on 06/15/2022 Stage prefix: Initial diagnosis Histologic grading system: 3 grade system   06/15/2022 Breast MRI   IMPRESSION: 1. Multiple sites of biopsy proven malignancy with in the lateral left breast. The overall conglomerate of suspicious findings spans approximately 4.4 x 3.5 x 3.5 cm, and contains all 3 post biopsy marking clips. 2. 10+ morphologically abnormal level I, II, III and Rotter's lymph nodes within the left axilla. 3. Diffuse skin and trabecular thickening on the left, consistent with lymphovascular spread of disease. 4. No MRI evidence of malignancy on the right.   06/20/2022 -  Chemotherapy   Patient is on Treatment Plan : BREAST ADJUVANT DOSE DENSE AC q14d / PACLitaxel q7d      Genetic Testing   [facility] Multi-Cancer Panel+RNA was Negative. Report date is 06/13/2022.  The Multi-Cancer + RNA Panel offered by [facility] includes sequencing and/or deletion/duplication analysis of the following 70 genes:  AIP*, ALK, APC*, ATM*, AXIN2*, BAP1*, BARD1*, BLM*, BMPR1A*, BRCA1*, BRCA2*, BRIP1*, CDC73*, CDH1*, CDK4, CDKN1B*, CDKN2A, CHEK2*, CTNNA1*, DICER1*, EPCAM (del/dup only), EGFR, FH*, FLCN*, GREM1 (promoter dup only), HOXB13, KIT, LZTR1, MAX*, MBD4, MEN1*, MET, MITF, MLH1*, MSH2*, MSH3*, MSH6*, MUTYH*, NF1*, NF2*, NTHL1*, PALB2*, PDGFRA, PMS2*, POLD1*, POLE*, POT1*, PRKAR1A*, PTCH1*, PTEN*, RAD51C*, RAD51D*, RB1*, RET, SDHA* (sequencing only), SDHAF2*, SDHB*, SDHC*, SDHD*, SMAD4*, SMARCA4*, SMARCB1*, SMARCE1*, STK11*, SUFU*, TMEM127*, TP53*, TSC1*, TSC2*, VHL*. RNA analysis is performed for * genes.     CURRENT THERAPY: Neoadjuvant chemotherapy  INTERVAL HISTORY: Lynn [name] 37 y.o. female returns for f/u of her recent transvaginal  ultrasound results.  She underwent this on 07/04/2022 and it demonstrated an adnexal mass that is indeterminant and MRI was recommended.  Lynn [name]  tells me that she has had several ovarian cysts in the past 1 of which she almost had to undergo drainage for.  She denies any abdominal discomfort and is otherwise feeling well today.  Patient Active Problem List   Diagnosis Date Noted   Genetic testing 06/15/2022   Anxiety and depression 06/11/2022   Malignant neoplasm of overlapping sites of left breast in female, estrogen receptor positive 06/04/2022   Syncope and collapse 03/12/2022   Umbilical hernia without obstruction and without gangrene 03/12/2022   Vitamin D deficiency 03/12/2022   Cervical radicular pain 12/07/2021   Snoring 12/07/2021   Other fatigue 12/07/2021   Chest pain 08/15/2021   Epigastric pain 08/15/2021   Acute pain of both shoulders 04/21/2021   Nerve pain 04/21/2021   Morbid obesity 10/13/2020   Essential hypertension 03/12/2019   Chronic tachycardia 03/12/2019   Generalized anxiety disorder with panic attacks 03/12/2019   Family history of heart attack 03/12/2019   LVH (left ventricular hypertrophy) due to hypertensive disease, without heart failure 03/12/2019   Type 2 diabetes mellitus 03/12/2019    is allergic to macrobid [nitrofurantoin macrocrystal] and ibuprofen.  MEDICAL HISTORY: Past Medical History:  Diagnosis Date   Cancer (HCC) 2024   L breast   Carpal tunnel syndrome, left    Diabetes (HCC)    Hypertension    Iron deficiency    Panic attacks    PCOS (polycystic ovarian syndrome)    Tachycardia     SURGICAL HISTORY: Past Surgical History:  Procedure Laterality Date   ADENOIDECTOMY     BREAST BIOPSY Left 05/29/2022   US LT BREAST BX W LOC DEV 1ST LESION IMG BX SPEC US GUIDE 05/29/2022 [facility]   BREAST BIOPSY Left 05/29/2022   US LT BREAST BX W LOC DEV EA ADD LESION IMG BX SPEC US GUIDE 05/29/2022 [facility]   BREAST BIOPSY Left 06/06/2022   MM LT BREAST BX W LOC DEV 1ST LESION IMAGE BX SPEC STEREO GUIDE 06/06/2022 [facility]   CESAREAN SECTION     PORTACATH  PLACEMENT N/A 06/19/2022   Procedure: INSERTION PORT-A-CATH;  Surgeon: Emelia LoronWakefield, Matthew, MD;  Location: [facility] OR;  Service: General;  Laterality: N/A;   TONSILLECTOMY      SOCIAL HISTORY: Social History   Socioeconomic History   Marital status: Significant Other    Spouse name: Not on file   Number of children: 1   Years of education: some college   Highest education level: Not on file  Occupational History   Not on file  Tobacco Use   Smoking status: Former    Packs/day: 1.00    Years: 7.00    Additional pack years: 0.00    Total pack years: 7.00    Types: Cigarettes, E-cigarettes    Quit date: 10/29/2021    Years since quitting: 0.6   Smokeless tobacco: Never  Vaping Use   Vaping Use: Former  Substance and Sexual Activity   Alcohol use: Yes    Comment: rarely   Drug use: No   Sexual activity: Yes    Birth control/protection: Condom  Other Topics Concern   Not on file  Social History Narrative      Social Determinants of Health   Financial Resource Strain: Not on file  Food Insecurity: Not on file  Transportation Needs: Not on file  Physical Activity: Not  on file  Stress: Not on file  Social Connections: Not on file  Intimate Partner Violence: Not on file    FAMILY HISTORY: Family History  Problem Relation Age of Onset   Heart disease Mother    Diabetes Mother    Hypertension Mother    Kidney disease Mother    Heart failure Mother    Heart attack Mother 56   Arthritis Mother    Asthma Mother    COPD Mother    Depression Mother    Hyperlipidemia Mother    Mental illness Mother    Bladder Cancer Mother    Heart disease Father    Heart attack Father 70   COPD Father    Hyperlipidemia Father    Hypertension Father    Mental illness Father    Arthritis Sister    Depression Sister    Diabetes Sister    Hypertension Sister    Mental illness Sister    Miscarriages / Stillbirths Sister    Alcohol abuse Sister    Depression Sister    Diabetes Sister     Heart disease Sister    Heart attack Sister 91   Arthritis Sister    Kidney disease Sister    Transient ischemic attack Sister    Bladder Cancer Maternal Aunt    Colon cancer Maternal Aunt 26 - 30   Other Maternal Aunt 21 - 59       brain tumor (unknown if benign or malignant)   Lung cancer Paternal Aunt 21   Melanoma Paternal Aunt 37 - 49   Heart failure Maternal Grandmother    Arthritis Maternal Grandmother    Alcohol abuse Maternal Grandmother    COPD Maternal Grandmother    Diabetes Maternal Grandmother    Hearing loss Maternal Grandmother    Heart disease Maternal Grandmother    Hyperlipidemia Maternal Grandmother    Hypertension Maternal Grandmother    Stroke Maternal Grandmother    Miscarriages / Stillbirths Maternal Grandmother    Mental illness Maternal Grandmother    Heart attack Maternal Grandmother    Heart failure Maternal Grandfather    Alcohol abuse Maternal Grandfather    Arthritis Maternal Grandfather    Early death Maternal Grandfather    Heart disease Maternal Grandfather    Hyperlipidemia Maternal Grandfather    Hypertension Maternal Grandfather    Heart attack Maternal Grandfather 60   Mental illness Maternal Grandfather    Heart failure Paternal Grandmother    Arthritis Paternal Grandmother    Diabetes Paternal Grandmother    Heart attack Paternal Grandmother    Miscarriages / Stillbirths Paternal Grandmother    Mental illness Paternal Grandmother    Kidney disease Paternal Grandmother    Intellectual disability Paternal Grandmother    Hypertension Paternal Grandmother    Hyperlipidemia Paternal Grandmother    Heart disease Paternal Grandmother    Heart failure Paternal Grandfather    Arthritis Paternal Grandfather    Heart attack Paternal Grandfather    Mental illness Paternal Grandfather    Hearing loss Paternal Grandfather    Heart disease Paternal Grandfather    Hyperlipidemia Paternal Grandfather    Hypertension Paternal Grandfather      Review of Systems  Constitutional:  Negative for appetite change, chills, fatigue, fever and unexpected weight change.  HENT:   Negative for hearing loss, lump/mass and trouble swallowing.   Eyes:  Negative for eye problems and icterus.  Respiratory:  Negative for chest tightness, cough and shortness of breath.   Cardiovascular:  Negative for chest pain, leg swelling and palpitations.  Gastrointestinal:  Negative for abdominal distention, abdominal pain, constipation, diarrhea, nausea and vomiting.  Endocrine: Negative for hot flashes.  Genitourinary:  Negative for difficulty urinating.   Musculoskeletal:  Negative for arthralgias.  Skin:  Negative for itching and rash.  Neurological:  Negative for dizziness, extremity weakness, headaches and numbness.  Hematological:  Negative for adenopathy. Does not bruise/bleed easily.  Psychiatric/Behavioral:  Negative for depression. The patient is not nervous/anxious.       PHYSICAL EXAMINATION Patient sounds well.  She is in no apparent distress.  Breathing is nonlabored.  Speech is normal.  Mood and behavior are normal.  LABORATORY DATA: None for this visit    ASSESSMENT and THERAPY PLAN:   Malignant neoplasm of overlapping sites of left breast in female, estrogen receptor positive (HCC) Lynn [name] is a 37 year old woman with stage IIIB ER/PR positive breast cancer diagnosed in 06/2022 who just began neoadjuvant chemotherapy last week.    Treatment Plan:  1. Neoadjuvant chemotherapy with Adriamycin and Cytoxan dose dense 4 followed by Taxol weekly 12  2. Staging Scans with CT c/a/p and bone scan 3. Followed by breast conserving surgery with targeted axillary dissection 4. Followed by adjuvant radiation therapy 5.  Followed by antiestrogen therapy  Pelvic ultrasound results reviewed MRI pelvis ordered Discussed with patient.   She will return on 07/17/2022 for labs, f/u, and her next treatment.   All questions were answered. The  patient knows to call the clinic with any problems, questions or concerns. We can certainly see the patient much sooner if necessary.  Total encounter time:8 minutes*in face-to-face visit time, chart review, lab review, care coordination, order entry, and documentation of the encounter time.    Lillard Anes, NP 07/09/22 12:38 PM Medical Oncology and Hematology [facility] [address] [place], [place] [address] Tel. [phone]    Fax. [phone]  *Total Encounter Time as defined by the Centers for Medicare and Medicaid Services includes, in addition to the face-to-face time of a patient visit (documented in the note above) non-face-to-face time: obtaining and reviewing outside history, ordering and reviewing medications, tests or procedures, care coordination (communications with other health care professionals or caregivers) and documentation in the medical record.

## 2022-07-09 NOTE — Telephone Encounter (Signed)
-----   Message from Loa Socks, NP sent at 07/09/2022  8:32 AM EDT ----- Please put patient on my schedule today as phone visit to discuss ultrasound results ----- Message ----- From: Interface, Rad Results In Sent: 07/04/2022   2:48 PM EDT To: Loa Socks, NP

## 2022-07-09 NOTE — Telephone Encounter (Signed)
Pt in agreement for 1145 phone visit with NP. Appt added.

## 2022-07-09 NOTE — Assessment & Plan Note (Signed)
Lynn [name] is a 37 year old woman with stage IIIB ER/PR positive breast cancer diagnosed in 06/2022 who just began neoadjuvant chemotherapy last week.    Treatment Plan:  1. Neoadjuvant chemotherapy with Adriamycin and Cytoxan dose dense 4 followed by Taxol weekly 12  2. Staging Scans with CT c/a/p and bone scan 3. Followed by breast conserving surgery with targeted axillary dissection 4. Followed by adjuvant radiation therapy 5.  Followed by antiestrogen therapy  Pelvic ultrasound results reviewed MRI pelvis ordered Discussed with patient.   She will return on 07/17/2022 for labs, f/u, and her next treatment.

## 2022-07-10 ENCOUNTER — Other Ambulatory Visit: Payer: Self-pay

## 2022-07-11 ENCOUNTER — Other Ambulatory Visit: Payer: Self-pay

## 2022-07-12 ENCOUNTER — Inpatient Hospital Stay: Payer: Managed Care, Other (non HMO)

## 2022-07-12 ENCOUNTER — Other Ambulatory Visit: Payer: Self-pay

## 2022-07-12 ENCOUNTER — Other Ambulatory Visit: Payer: Managed Care, Other (non HMO)

## 2022-07-12 ENCOUNTER — Inpatient Hospital Stay: Payer: Managed Care, Other (non HMO) | Admitting: Physician Assistant

## 2022-07-12 ENCOUNTER — Telehealth: Payer: Self-pay

## 2022-07-12 VITALS — BP 137/87 | HR 92 | Temp 97.9°F | Resp 18 | Wt 329.9 lb

## 2022-07-12 DIAGNOSIS — R300 Dysuria: Secondary | ICD-10-CM | POA: Diagnosis not present

## 2022-07-12 DIAGNOSIS — C50812 Malignant neoplasm of overlapping sites of left female breast: Secondary | ICD-10-CM

## 2022-07-12 DIAGNOSIS — Z170 Estrogen receptor positive status [ER+]: Secondary | ICD-10-CM | POA: Diagnosis not present

## 2022-07-12 DIAGNOSIS — Z95828 Presence of other vascular implants and grafts: Secondary | ICD-10-CM | POA: Diagnosis not present

## 2022-07-12 LAB — CBC WITH DIFFERENTIAL (CANCER CENTER ONLY)
Abs Immature Granulocytes: 0.02 10*3/uL (ref 0.00–0.07)
Basophils Absolute: 0.1 10*3/uL (ref 0.0–0.1)
Basophils Relative: 3 %
Eosinophils Absolute: 0 10*3/uL (ref 0.0–0.5)
Eosinophils Relative: 1 %
HCT: 33.9 % — ABNORMAL LOW (ref 36.0–46.0)
Hemoglobin: 11.2 g/dL — ABNORMAL LOW (ref 12.0–15.0)
Immature Granulocytes: 1 %
Lymphocytes Relative: 37 %
Lymphs Abs: 1 10*3/uL (ref 0.7–4.0)
MCH: 25.8 pg — ABNORMAL LOW (ref 26.0–34.0)
MCHC: 33 g/dL (ref 30.0–36.0)
MCV: 78.1 fL — ABNORMAL LOW (ref 80.0–100.0)
Monocytes Absolute: 0.7 10*3/uL (ref 0.1–1.0)
Monocytes Relative: 26 %
Neutro Abs: 0.9 10*3/uL — ABNORMAL LOW (ref 1.7–7.7)
Neutrophils Relative %: 32 %
Platelet Count: 257 10*3/uL (ref 150–400)
RBC: 4.34 MIL/uL (ref 3.87–5.11)
RDW: 13.1 % (ref 11.5–15.5)
Smear Review: NORMAL
WBC Count: 2.6 10*3/uL — ABNORMAL LOW (ref 4.0–10.5)
nRBC: 0 % (ref 0.0–0.2)

## 2022-07-12 LAB — URINALYSIS, COMPLETE (UACMP) WITH MICROSCOPIC
Bilirubin Urine: NEGATIVE
Glucose, UA: NEGATIVE mg/dL
Hgb urine dipstick: NEGATIVE
Ketones, ur: NEGATIVE mg/dL
Nitrite: NEGATIVE
Protein, ur: NEGATIVE mg/dL
Specific Gravity, Urine: 1.014 (ref 1.005–1.030)
pH: 6 (ref 5.0–8.0)

## 2022-07-12 LAB — CMP (CANCER CENTER ONLY)
ALT: 18 U/L (ref 0–44)
AST: 14 U/L — ABNORMAL LOW (ref 15–41)
Albumin: 3.8 g/dL (ref 3.5–5.0)
Alkaline Phosphatase: 72 U/L (ref 38–126)
Anion gap: 6 (ref 5–15)
BUN: 12 mg/dL (ref 6–20)
CO2: 27 mmol/L (ref 22–32)
Calcium: 8.7 mg/dL — ABNORMAL LOW (ref 8.9–10.3)
Chloride: 102 mmol/L (ref 98–111)
Creatinine: 0.65 mg/dL (ref 0.44–1.00)
GFR, Estimated: >60 mL/min (ref >60)
Glucose, Bld: 136 mg/dL — ABNORMAL HIGH (ref 70–99)
Potassium: 4.2 mmol/L (ref 3.5–5.1)
Sodium: 135 mmol/L (ref 135–145)
Total Bilirubin: 0.3 mg/dL (ref 0.3–1.2)
Total Protein: 6.7 g/dL (ref 6.5–8.1)

## 2022-07-12 MED ORDER — SODIUM CHLORIDE 0.9% FLUSH
10.0000 mL | Freq: Once | INTRAVENOUS | Status: AC
  Administered 2022-07-12: 10 mL via INTRAVENOUS

## 2022-07-12 MED ORDER — SODIUM CHLORIDE 0.9 % IV SOLN: Freq: Once | INTRAVENOUS | Status: AC

## 2022-07-12 MED ORDER — CIPROFLOXACIN HCL 500 MG PO TABS: 500.0000 mg | ORAL_TABLET | Freq: Two times a day (BID) | ORAL | 0 refills | Status: AC

## 2022-07-12 MED ORDER — HEPARIN SOD (PORK) LOCK FLUSH 100 UNIT/ML IV SOLN
500.0000 [IU] | Freq: Once | INTRAVENOUS | Status: AC
  Administered 2022-07-12: 500 [IU] via INTRAVENOUS

## 2022-07-12 NOTE — Telephone Encounter (Signed)
Returned Pt's call regarding painful urination and hematuria. Pt reports dysuria for 4 days, but worsened pain and hematuria starting this AM. Appt made for lab and [facility] today per PA. Pt verbalized understanding.

## 2022-07-12 NOTE — Progress Notes (Signed)
Symptom Management Consult Note [facility]    Patient Care Team: Gerre ScullMcElwee, Lauren A, NP as PCP - General (Internal Medicine) Gweneth Dimitriody, Anisten, MD as Consulting Physician (Family Medicine) Gweneth Dimitriody, Demiyah, MD as Consulting Physician (Family Medicine) Serena CroissantGudena, Vinay, MD as Consulting Physician (Hematology and Oncology) Emelia LoronWakefield, Matthew, MD as Consulting Physician (General Surgery) Dorothy PufferMoody, John, MD as Consulting Physician (Radiation Oncology) Donnelly AngelicaMartini, Keisha N, RN as Oncology Nurse Navigator Pershing ProudStuart, Dawn C, RN as Oncology Nurse Navigator    Name / MRN / DOB: Lynn [name]  [id number]  [birth year]   Date of visit: 07/12/2022   Chief Complaint/Reason for visit: dysuria   Current Therapy: Adriamycin and cytoxan with Udenyca  Last treatment:  Day 1   Cycle 2 on 07/03/22 with G-CSF on 07/05/22   ASSESSMENT & PLAN: Patient is a 37 y.o. 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 Pamelia HoitGudena.  I have viewed most recent oncology note and lab work.    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 Benign abdominal exam, no CVA tenderness. - Patient given 1L NS in clinic for hydration support. - CBC with neutropenia WBC 2.6, ANC 0.9, HgB 11.2. Discussed neutropenic precautions. - UA suggestive of infection with trace leukocytes, WBC 11-20 and rare bacteria in the setting of neutropenic will treat for UTI. Urine culture sent. Cipro sent to pharmacy.   #Constipation - Resolved after enema. Patient educated on PO constipation management with Miralax and stool softener and should avoid enemas while undergoing treatment.   Strict ED precautions discussed should symptoms worsen.   Heme/Onc History: Oncology History  Malignant neoplasm of overlapping sites of left  breast in female, estrogen receptor positive  05/29/2022 Initial Diagnosis   Palpable abnormalities in the left breast led to mammograms which revealed 3 masses 0.7 cm at 1 o'clock position, 3 cm at 2 o'clock position, additional nodule 8 mm total span 5 cm, 5 abnormal axillary lymph nodes biopsy of the masses and the lymph node were positive for grade 3 IDC with high-grade DCIS with LVI, ER 95%, PR 40%, Ki-67 60%, HER2 2+ by IHC FISH negative   06/15/2022 Cancer Staging   Staging form: Breast, AJCC 8th Edition - Clinical: Stage IIIB (cT3, cN3, cM0, G3, ER+, PR+, HER2-) - Signed by Loa Socksausey, Lindsey Cornetto, NP on 06/15/2022 Stage prefix: Initial diagnosis Histologic grading system: 3 grade system   06/15/2022 Breast MRI   IMPRESSION: 1. Multiple sites of biopsy proven malignancy with in the lateral left breast. The overall conglomerate of suspicious findings spans approximately 4.4 x 3.5 x 3.5 cm, and contains all 3 post biopsy marking clips. 2. 10+ morphologically abnormal level I, II, III and Rotter's lymph nodes within the left axilla. 3. Diffuse skin and trabecular thickening on the left, consistent with lymphovascular spread of disease. 4. No MRI evidence of malignancy on the right.   06/20/2022 -  Chemotherapy   Patient is on Treatment Plan : BREAST ADJUVANT DOSE DENSE AC q14d / PACLitaxel q7d      Genetic Testing   [facility] Multi-Cancer Panel+RNA was Negative. Report date is 06/13/2022.  The Multi-Cancer + RNA Panel offered by [facility] includes sequencing and/or deletion/duplication analysis of the following 70 genes:  AIP*, ALK, APC*, ATM*, AXIN2*, BAP1*, BARD1*, BLM*, BMPR1A*, BRCA1*, BRCA2*, BRIP1*, CDC73*, CDH1*, CDK4, CDKN1B*, CDKN2A, CHEK2*,  CTNNA1*, DICER1*, EPCAM (del/dup only), EGFR, FH*, FLCN*, GREM1 (promoter dup only), HOXB13, KIT, LZTR1, MAX*, MBD4, MEN1*, MET, MITF, MLH1*, MSH2*, MSH3*, MSH6*, MUTYH*, NF1*, NF2*, NTHL1*, PALB2*, PDGFRA, PMS2*, POLD1*, POLE*, POT1*, PRKAR1A*,  PTCH1*, PTEN*, RAD51C*, RAD51D*, RB1*, RET, SDHA* (sequencing only), SDHAF2*, SDHB*, SDHC*, SDHD*, SMAD4*, SMARCA4*, SMARCB1*, SMARCE1*, STK11*, SUFU*, TMEM127*, TP53*, TSC1*, TSC2*, VHL*. RNA analysis is performed for * genes.       Interval history-: Lynn [name] is a 37 y.o. female with oncologic history as above presenting to [facility] today with chief complaint of dysuria and constipation. She present unaccompanied to clinic today.  Patient first noticed dysuria x 4 days ago.  It lasted approximately 24 hours and resolved after she increased her fluid intake that day.  Dysuria started again today.  She has no back or pelvic pain.  She denies history of frequent UTIs.  Denies any vaginal discharge.  She has been checking her temperature daily at home and denies any fever. Patient also reports constipation during chemotherapy.  She states it is worse with the Zofran. She used an enema last night with relief. She denies abdominal pain, nausea, vomiting, back pain.      ROS  All other systems are reviewed and are negative for acute change except as noted in the HPI.    Allergies  Allergen Reactions   Macrobid [Nitrofurantoin Macrocrystal] Shortness Of Breath and Nausea And Vomiting   Ibuprofen Hives     Past Medical History:  Diagnosis Date   Cancer (HCC) 2024   L breast   Carpal tunnel syndrome, left    Diabetes (HCC)    Hypertension    Iron deficiency    Panic attacks    PCOS (polycystic ovarian syndrome)    Tachycardia      Past Surgical History:  Procedure Laterality Date   ADENOIDECTOMY     BREAST BIOPSY Left 05/29/2022   [place] LT BREAST BX W LOC DEV 1ST LESION IMG BX SPEC US GUIDE 05/29/2022 [facility]   BREAST BIOPSY Left 05/29/2022   [place] LT BREAST BX W LOC DEV EA ADD LESION IMG BX SPEC US GUIDE 05/29/2022 [facility]   BREAST BIOPSY Left 06/06/2022   MM LT BREAST BX W LOC DEV 1ST LESION IMAGE BX SPEC STEREO GUIDE 06/06/2022 [facility]   CESAREAN  SECTION     PORTACATH PLACEMENT N/A 06/19/2022   Procedure: INSERTION PORT-A-CATH;  Surgeon: Emelia Loron, MD;  Location: [facility] OR;  Service: General;  Laterality: N/A;   TONSILLECTOMY      Social History   Socioeconomic History   Marital status: Significant Other    Spouse name: Not on file   Number of children: 1   Years of education: some college   Highest education level: Not on file  Occupational History   Not on file  Tobacco Use   Smoking status: Former    Packs/day: 1.00    Years: 7.00    Additional pack years: 0.00    Total pack years: 7.00    Types: Cigarettes, E-cigarettes    Quit date: 10/29/2021    Years since quitting: 0.7   Smokeless tobacco: Never  Vaping Use   Vaping Use: Former  Substance and Sexual Activity   Alcohol use: Yes    Comment: rarely   Drug use: No   Sexual activity: Yes    Birth control/protection: Condom  Other Topics Concern   Not on file  Social History Narrative      Social Determinants of  Health   Financial Resource Strain: Not on file  Food Insecurity: Not on file  Transportation Needs: Not on file  Physical Activity: Not on file  Stress: Not on file  Social Connections: Not on file  Intimate Partner Violence: Not on file    Family History  Problem Relation Age of Onset   Heart disease Mother    Diabetes Mother    Hypertension Mother    Kidney disease Mother    Heart failure Mother    Heart attack Mother 64   Arthritis Mother    Asthma Mother    COPD Mother    Depression Mother    Hyperlipidemia Mother    Mental illness Mother    Bladder Cancer Mother    Heart disease Father    Heart attack Father 105   COPD Father    Hyperlipidemia Father    Hypertension Father    Mental illness Father    Arthritis Sister    Depression Sister    Diabetes Sister    Hypertension Sister    Mental illness Sister    Miscarriages / Stillbirths Sister    Alcohol abuse Sister    Depression Sister    Diabetes Sister    Heart  disease Sister    Heart attack Sister 2   Arthritis Sister    Kidney disease Sister    Transient ischemic attack Sister    Bladder Cancer Maternal Aunt    Colon cancer Maternal Aunt 69 - 27   Other Maternal Aunt 67 - 59       brain tumor (unknown if benign or malignant)   Lung cancer Paternal Aunt 65   Melanoma Paternal Aunt 22 - 49   Heart failure Maternal Grandmother    Arthritis Maternal Grandmother    Alcohol abuse Maternal Grandmother    COPD Maternal Grandmother    Diabetes Maternal Grandmother    Hearing loss Maternal Grandmother    Heart disease Maternal Grandmother    Hyperlipidemia Maternal Grandmother    Hypertension Maternal Grandmother    Stroke Maternal Grandmother    Miscarriages / Stillbirths Maternal Grandmother    Mental illness Maternal Grandmother    Heart attack Maternal Grandmother    Heart failure Maternal Grandfather    Alcohol abuse Maternal Grandfather    Arthritis Maternal Grandfather    Early death Maternal Grandfather    Heart disease Maternal Grandfather    Hyperlipidemia Maternal Grandfather    Hypertension Maternal Grandfather    Heart attack Maternal Grandfather 60   Mental illness Maternal Grandfather    Heart failure Paternal Grandmother    Arthritis Paternal Grandmother    Diabetes Paternal Grandmother    Heart attack Paternal Grandmother    Miscarriages / Stillbirths Paternal Grandmother    Mental illness Paternal Grandmother    Kidney disease Paternal Grandmother    Intellectual disability Paternal Grandmother    Hypertension Paternal Grandmother    Hyperlipidemia Paternal Grandmother    Heart disease Paternal Grandmother    Heart failure Paternal Grandfather    Arthritis Paternal Grandfather    Heart attack Paternal Grandfather    Mental illness Paternal Grandfather    Hearing loss Paternal Grandfather    Heart disease Paternal Grandfather    Hyperlipidemia Paternal Grandfather    Hypertension Paternal Grandfather       Current Outpatient Medications:    ciprofloxacin (CIPRO) 500 MG tablet, Take 1 tablet (500 mg total) by mouth 2 (two) times daily for 7 days., Disp: 14 tablet, Rfl:  0   acetaminophen (TYLENOL) 500 MG tablet, Take 2 tablets (1,000 mg total) by mouth every 6 (six) hours as needed for moderate pain or headache., Disp: 30 tablet, Rfl: 0   ALPRAZolam (XANAX) 0.25 MG tablet, Take 1 tablet 45 minutes before MRI, then 1 tablet once there if needed. Do not drive (Patient not taking: Reported on 06/18/2022), Disp: 2 tablet, Rfl: 0   aspirin EC 325 MG tablet, Take 325 mg by mouth in the morning. (Patient not taking: Reported on 06/27/2022), Disp: , Rfl:    aspirin-acetaminophen-caffeine (EXCEDRIN MIGRAINE) 250-250-65 MG tablet, Take 1-2 tablets by mouth 2 (two) times daily as needed for headache or migraine. (Patient not taking: Reported on 06/18/2022), Disp: , Rfl:    Blood Glucose Monitoring Suppl (ACCU-CHEK AVIVA PLUS) w/Device KIT, 1 each by Does not apply route daily at 6 (six) AM. (Patient not taking: Reported on 07/03/2022), Disp: 1 kit, Rfl: 0   carvedilol (COREG) 25 MG tablet, Take 1 tablet (25 mg total) by mouth 2 (two) times daily with a meal., Disp: 180 tablet, Rfl: 1   cyclobenzaprine (FLEXERIL) 10 MG tablet, Take 1 tablet (10 mg total) by mouth 3 (three) times daily as needed for muscle spasms. (Patient not taking: Reported on 06/27/2022), Disp: 30 tablet, Rfl: 1   dexamethasone (DECADRON) 4 MG tablet, Take 1 tab on day 2 and 1 tab on day 3 Take with food., Disp: 12 tablet, Rfl: 0   famotidine (PEPCID) 20 MG tablet, TAKE 1 TABLET(20 MG) BY MOUTH TWICE DAILY, Disp: 180 tablet, Rfl: 0   glucose blood (ACCU-CHEK AVIVA PLUS) test strip, Use as instructed, Disp: 100 each, Rfl: 12   lidocaine-prilocaine (EMLA) cream, Apply to affected area once, Disp: 30 g, Rfl: 3   ondansetron (ZOFRAN) 8 MG tablet, Take 1 tab (8 mg) by mouth every 8 hrs as needed for nausea/vomiting. Start third day after  doxorubicin/cyclophosphamide chemotherapy., Disp: 30 tablet, Rfl: 1   ONE TOUCH CLUB LANCETS MISC, 1 each by Does not apply route daily., Disp: 100 each, Rfl: 11   prochlorperazine (COMPAZINE) 10 MG tablet, Take 1 tablet (10 mg total) by mouth every 6 (six) hours as needed for nausea or vomiting., Disp: 30 tablet, Rfl: 1   Semaglutide,0.25 or 0.5MG /DOS, (OZEMPIC, 0.25 OR 0.5 MG/DOSE,) 2 MG/1.5ML SOPN, Inject 0.25 mg into the skin once a week. Start with 0.25MG  once a week x 4 weeks, then increase to 0.5MG  weekly., Disp: 1.5 mL, Rfl: 3   sertraline (ZOLOFT) 25 MG tablet, Take 1 tablet (25 mg total) by mouth daily. (Patient not taking: Reported on 06/18/2022), Disp: 30 tablet, Rfl: 1  PHYSICAL EXAM: ECOG FS:1 - Symptomatic but completely ambulatory    Vitals:   07/12/22 1111 07/12/22 1229  BP: 137/86 137/87  Pulse: 93 92  Resp: 20 18  Temp: 97.9 F (36.6 C)   TempSrc: Oral   SpO2: 98% 100%  Weight: (!) 329 lb 14.4 oz (149.6 kg)    Physical Exam Vitals and nursing note reviewed.  Constitutional:      Appearance: She is not ill-appearing or toxic-appearing.  HENT:     Head: Normocephalic.  Eyes:     Conjunctiva/sclera: Conjunctivae normal.  Cardiovascular:     Rate and Rhythm: Normal rate and regular rhythm.     Pulses: Normal pulses.     Heart sounds: Normal heart sounds.  Pulmonary:     Effort: Pulmonary effort is normal.     Breath sounds: Normal breath sounds.  Abdominal:  General: There is no distension.     Palpations: Abdomen is soft.     Tenderness: There is no abdominal tenderness. There is no right CVA tenderness or left CVA tenderness.  Musculoskeletal:     Cervical back: Normal range of motion.  Skin:    General: Skin is warm and dry.  Neurological:     Mental Status: She is alert.        LABORATORY DATA: I have reviewed the data as listed    Latest Ref Rng & Units 07/12/2022   10:49 AM 07/03/2022    8:13 AM 06/27/2022   10:00 AM  CBC  WBC 4.0 - 10.5  K/uL 2.6  6.8  2.7   Hemoglobin 12.0 - 15.0 g/dL 95.6  21.3  08.6   Hematocrit 36.0 - 46.0 % 33.9  37.3  34.8   Platelets 150 - 400 K/uL 257  206  205         Latest Ref Rng & Units 07/12/2022   10:49 AM 07/03/2022    8:13 AM 06/27/2022   10:00 AM  CMP  Glucose 70 - 99 mg/dL 578  469  629   BUN 6 - 20 mg/dL Creatinine 0.44 - 1.00 mg/dL 5.28  4.13  2.44   Sodium 135 - 145 mmol/L 135  135  134   Potassium 3.5 - 5.1 mmol/L 4.2  4.1  4.4   Chloride 98 - 111 mmol/L 102  101  100   CO2 22 - 32 mmol/L Calcium 8.9 - 10.3 mg/dL 8.7  9.2  8.7   Total Protein 6.5 - 8.1 g/dL 6.7  7.1  7.1   Total Bilirubin 0.3 - 1.2 mg/dL 0.3  0.3  0.5   Alkaline Phos 38 - 126 U/L 72  68  70   AST 15 - 41 U/L ALT 0 - 44 U/L RADIOGRAPHIC STUDIES (from last 24 hours if applicable) I have personally reviewed the radiological images as listed and agreed with the findings in the report. No results found.      Visit Diagnosis: 1. Malignant neoplasm of overlapping sites of left breast in female, estrogen receptor positive   2. Port-A-Cath in place   3. Dysuria      No orders of the defined types were placed in this encounter.   All questions were answered. The patient knows to call the clinic with any problems, questions or concerns. No barriers to learning was detected.  I have spent a total of 30 minutes minutes of face-to-face and non-face-to-face time, preparing to see the patient, obtaining and/or reviewing separately obtained history, performing a medically appropriate examination, counseling and educating the patient, ordering tests, documenting clinical information in the electronic health record, and care coordination (communications with other health care professionals or caregivers).    Thank you for allowing me to participate in the care of this patient.    Shanon Ace, PA-C Department of Hematology/Oncology [facility] Phone: [phone]  [phone]    07/12/2022 1:26 PM

## 2022-07-14 LAB — URINE CULTURE: Culture: 10000 — AB

## 2022-07-16 MED FILL — Dexamethasone Sodium Phosphate Inj 100 MG/10ML: INTRAMUSCULAR | Qty: 1 | Status: AC

## 2022-07-16 MED FILL — Fosaprepitant Dimeglumine For IV Infusion 150 MG (Base Eq): INTRAVENOUS | Qty: 5 | Status: AC

## 2022-07-16 NOTE — Progress Notes (Incomplete)
Patient Care Team: Gerre Scull, NP as PCP - General (Internal Medicine) Gweneth Dimitri, MD as Consulting Physician (Family Medicine) Gweneth Dimitri, MD as Consulting Physician (Family Medicine) Serena Croissant, MD as Consulting Physician (Hematology and Oncology) Emelia Loron, MD as Consulting Physician (General Surgery) Dorothy Puffer, MD as Consulting Physician (Radiation Oncology) Donnelly Angelica, RN as Oncology Nurse Navigator Pershing Proud, RN as Oncology Nurse Navigator  DIAGNOSIS: No diagnosis found.  SUMMARY OF ONCOLOGIC HISTORY: Oncology History  Malignant neoplasm of overlapping sites of left breast in female, estrogen receptor positive  05/29/2022 Initial Diagnosis   Palpable abnormalities in the left breast led to mammograms which revealed 3 masses 0.7 cm at 1 o'clock position, 3 cm at 2 o'clock position, additional nodule 8 mm total span 5 cm, 5 abnormal axillary lymph nodes biopsy of the masses and the lymph node were positive for grade 3 IDC with high-grade DCIS with LVI, ER 95%, PR 40%, Ki-67 60%, HER2 2+ by IHC FISH negative   06/15/2022 Cancer Staging   Staging form: Breast, AJCC 8th Edition - Clinical: Stage IIIB (cT3, cN3, cM0, G3, ER+, PR+, HER2-) - Signed by Loa Socks, NP on 06/15/2022 Stage prefix: Initial diagnosis Histologic grading system: 3 grade system   06/15/2022 Breast MRI   IMPRESSION: 1. Multiple sites of biopsy proven malignancy with in the lateral left breast. The overall conglomerate of suspicious findings spans approximately 4.4 x 3.5 x 3.5 cm, and contains all 3 post biopsy marking clips. 2. 10+ morphologically abnormal level I, II, III and Rotter's lymph nodes within the left axilla. 3. Diffuse skin and trabecular thickening on the left, consistent with lymphovascular spread of disease. 4. No MRI evidence of malignancy on the right.   06/20/2022 -  Chemotherapy   Patient is on Treatment Plan : BREAST ADJUVANT DOSE DENSE  AC q14d / PACLitaxel q7d      Genetic Testing   [employer] Multi-Cancer Panel+RNA was Negative. Report date is 06/13/2022.  The Multi-Cancer + RNA Panel offered by [facility] includes sequencing and/or deletion/duplication analysis of the following 70 genes:  AIP*, ALK, APC*, ATM*, AXIN2*, BAP1*, BARD1*, BLM*, BMPR1A*, BRCA1*, BRCA2*, BRIP1*, CDC73*, CDH1*, CDK4, CDKN1B*, CDKN2A, CHEK2*, CTNNA1*, DICER1*, EPCAM (del/dup only), EGFR, FH*, FLCN*, GREM1 (promoter dup only), HOXB13, KIT, LZTR1, MAX*, MBD4, MEN1*, MET, MITF, MLH1*, MSH2*, MSH3*, MSH6*, MUTYH*, NF1*, NF2*, NTHL1*, PALB2*, PDGFRA, PMS2*, POLD1*, POLE*, POT1*, PRKAR1A*, PTCH1*, PTEN*, RAD51C*, RAD51D*, RB1*, RET, SDHA* (sequencing only), SDHAF2*, SDHB*, SDHC*, SDHD*, SMAD4*, SMARCA4*, SMARCB1*, SMARCE1*, STK11*, SUFU*, TMEM127*, TP53*, TSC1*, TSC2*, VHL*. RNA analysis is performed for * genes.     CHIEF COMPLIANT:   INTERVAL HISTORY: Lynn [name] is a   ALLERGIES:  is allergic to macrobid [nitrofurantoin macrocrystal] and ibuprofen.  MEDICATIONS:  Current Outpatient Medications  Medication Sig Dispense Refill   acetaminophen (TYLENOL) 500 MG tablet Take 2 tablets (1,000 mg total) by mouth every 6 (six) hours as needed for moderate pain or headache. 30 tablet 0   ALPRAZolam (XANAX) 0.25 MG tablet Take 1 tablet 45 minutes before MRI, then 1 tablet once there if needed. Do not drive (Patient not taking: Reported on 06/18/2022) 2 tablet 0   aspirin EC 325 MG tablet Take 325 mg by mouth in the morning. (Patient not taking: Reported on 06/27/2022)     aspirin-acetaminophen-caffeine (EXCEDRIN MIGRAINE) 250-250-65 MG tablet Take 1-2 tablets by mouth 2 (two) times daily as needed for headache or migraine. (Patient not taking: Reported on 06/18/2022)     Blood  Glucose Monitoring Suppl (ACCU-CHEK AVIVA PLUS) w/Device KIT 1 each by Does not apply route daily at 6 (six) AM. (Patient not taking: Reported on 07/03/2022) 1 kit 0   carvedilol (COREG) 25 MG  tablet Take 1 tablet (25 mg total) by mouth 2 (two) times daily with a meal. 180 tablet 1   ciprofloxacin (CIPRO) 500 MG tablet Take 1 tablet (500 mg total) by mouth 2 (two) times daily for 7 days. 14 tablet 0   cyclobenzaprine (FLEXERIL) 10 MG tablet Take 1 tablet (10 mg total) by mouth 3 (three) times daily as needed for muscle spasms. (Patient not taking: Reported on 06/27/2022) 30 tablet 1   dexamethasone (DECADRON) 4 MG tablet Take 1 tab on day 2 and 1 tab on day 3 Take with food. 12 tablet 0   famotidine (PEPCID) 20 MG tablet TAKE 1 TABLET(20 MG) BY MOUTH TWICE DAILY 180 tablet 0   glucose blood (ACCU-CHEK AVIVA PLUS) test strip Use as instructed 100 each 12   lidocaine-prilocaine (EMLA) cream Apply to affected area once 30 g 3   ondansetron (ZOFRAN) 8 MG tablet Take 1 tab (8 mg) by mouth every 8 hrs as needed for nausea/vomiting. Start third day after doxorubicin/cyclophosphamide chemotherapy. 30 tablet 1   ONE TOUCH CLUB LANCETS MISC 1 each by Does not apply route daily. 100 each 11   prochlorperazine (COMPAZINE) 10 MG tablet Take 1 tablet (10 mg total) by mouth every 6 (six) hours as needed for nausea or vomiting. 30 tablet 1   Semaglutide,0.25 or 0.5MG /DOS, (OZEMPIC, 0.25 OR 0.5 MG/DOSE,) 2 MG/1.5ML SOPN Inject 0.25 mg into the skin once a week. Start with 0.25MG  once a week x 4 weeks, then increase to 0.5MG  weekly. 1.5 mL 3   sertraline (ZOLOFT) 25 MG tablet Take 1 tablet (25 mg total) by mouth daily. (Patient not taking: Reported on 06/18/2022) 30 tablet 1   No current facility-administered medications for this visit.    PHYSICAL EXAMINATION: ECOG PERFORMANCE STATUS: {CHL ONC ECOG [phone]}  There were no vitals filed for this visit. There were no vitals filed for this visit.  BREAST:*** No palpable masses or nodules in either right or left breasts. No palpable axillary supraclavicular or infraclavicular adenopathy no breast tenderness or nipple discharge. (exam performed in the  presence of a chaperone)  LABORATORY DATA:  I have reviewed the data as listed    Latest Ref Rng & Units 07/12/2022   10:49 AM 07/03/2022    8:13 AM 06/27/2022   10:00 AM  CMP  Glucose 70 - 99 mg/dL 295  621  308   BUN 6 - 20 mg/dL Creatinine 0.44 - 1.00 mg/dL 6.57  8.46  9.62   Sodium 135 - 145 mmol/L 135  135  134   Potassium 3.5 - 5.1 mmol/L 4.2  4.1  4.4   Chloride 98 - 111 mmol/L 102  101  100   CO2 22 - 32 mmol/L Calcium 8.9 - 10.3 mg/dL 8.7  9.2  8.7   Total Protein 6.5 - 8.1 g/dL 6.7  7.1  7.1   Total Bilirubin 0.3 - 1.2 mg/dL 0.3  0.3  0.5   Alkaline Phos 38 - 126 U/L 72  68  70   AST 15 - 41 U/L ALT 0 - 44 U/L Lab Results  Component  Value Date   WBC 2.6 (L) 07/12/2022   HGB 11.2 (L) 07/12/2022   HCT 33.9 (L) 07/12/2022   MCV 78.1 (L) 07/12/2022   PLT 257 07/12/2022   NEUTROABS 0.9 (L) 07/12/2022    ASSESSMENT & PLAN:  No problem-specific Assessment & Plan notes found for this encounter.    No orders of the defined types were placed in this encounter.  The patient has a good understanding of the overall plan. she agrees with it. she will call with any problems that may develop before the next visit here. Total time spent: 30 mins including face to face time and time spent for planning, charting and co-ordination of care   Sherlyn Lick, CMA 07/16/22    I Janan Ridge am acting as a Neurosurgeon for [employer]  ***

## 2022-07-17 ENCOUNTER — Inpatient Hospital Stay (HOSPITAL_BASED_OUTPATIENT_CLINIC_OR_DEPARTMENT_OTHER): Payer: Managed Care, Other (non HMO) | Admitting: Hematology and Oncology

## 2022-07-17 ENCOUNTER — Inpatient Hospital Stay: Payer: Managed Care, Other (non HMO)

## 2022-07-17 VITALS — BP 134/90 | HR 100 | Resp 18

## 2022-07-17 VITALS — BP 154/86 | HR 100 | Temp 97.3°F | Resp 18 | Ht 67.0 in | Wt 321.5 lb

## 2022-07-17 DIAGNOSIS — Z170 Estrogen receptor positive status [ER+]: Secondary | ICD-10-CM | POA: Diagnosis not present

## 2022-07-17 DIAGNOSIS — C50812 Malignant neoplasm of overlapping sites of left female breast: Secondary | ICD-10-CM

## 2022-07-17 LAB — CBC WITH DIFFERENTIAL (CANCER CENTER ONLY)
Abs Immature Granulocytes: 0.21 10*3/uL — ABNORMAL HIGH (ref 0.00–0.07)
Basophils Absolute: 0.1 10*3/uL (ref 0.0–0.1)
Basophils Relative: 2 %
Eosinophils Absolute: 0 10*3/uL (ref 0.0–0.5)
Eosinophils Relative: 0 %
HCT: 34.4 % — ABNORMAL LOW (ref 36.0–46.0)
Hemoglobin: 11.7 g/dL — ABNORMAL LOW (ref 12.0–15.0)
Immature Granulocytes: 4 %
Lymphocytes Relative: 17 %
Lymphs Abs: 0.9 10*3/uL (ref 0.7–4.0)
MCH: 26.4 pg (ref 26.0–34.0)
MCHC: 34 g/dL (ref 30.0–36.0)
MCV: 77.7 fL — ABNORMAL LOW (ref 80.0–100.0)
Monocytes Absolute: 0.8 10*3/uL (ref 0.1–1.0)
Monocytes Relative: 17 %
Neutro Abs: 2.9 10*3/uL (ref 1.7–7.7)
Neutrophils Relative %: 60 %
Platelet Count: 177 10*3/uL (ref 150–400)
RBC: 4.43 MIL/uL (ref 3.87–5.11)
RDW: 14 % (ref 11.5–15.5)
WBC Count: 4.9 10*3/uL (ref 4.0–10.5)
nRBC: 0 % (ref 0.0–0.2)

## 2022-07-17 LAB — CMP (CANCER CENTER ONLY)
ALT: 22 U/L (ref 0–44)
AST: 19 U/L (ref 15–41)
Albumin: 3.8 g/dL (ref 3.5–5.0)
Alkaline Phosphatase: 63 U/L (ref 38–126)
Anion gap: 6 (ref 5–15)
BUN: 10 mg/dL (ref 6–20)
CO2: 27 mmol/L (ref 22–32)
Calcium: 8.8 mg/dL — ABNORMAL LOW (ref 8.9–10.3)
Chloride: 103 mmol/L (ref 98–111)
Creatinine: 0.74 mg/dL (ref 0.44–1.00)
GFR, Estimated: >60 mL/min (ref >60)
Glucose, Bld: 179 mg/dL — ABNORMAL HIGH (ref 70–99)
Potassium: 4 mmol/L (ref 3.5–5.1)
Sodium: 136 mmol/L (ref 135–145)
Total Bilirubin: 0.4 mg/dL (ref 0.3–1.2)
Total Protein: 6.6 g/dL (ref 6.5–8.1)

## 2022-07-17 MED ORDER — SODIUM CHLORIDE 0.9% FLUSH
10.0000 mL | INTRAVENOUS | Status: DC | PRN
  Administered 2022-07-17: 10 mL

## 2022-07-17 MED ORDER — SODIUM CHLORIDE 0.9 % IV SOLN
10.0000 mg | Freq: Once | INTRAVENOUS | Status: AC
  Administered 2022-07-17: 10 mg via INTRAVENOUS
  Filled 2022-07-17: qty 10

## 2022-07-17 MED ORDER — ALPRAZOLAM 0.5 MG PO TABS: 0.5000 mg | ORAL_TABLET | Freq: Every evening | ORAL | 1 refills | Status: DC | PRN

## 2022-07-17 MED ORDER — SODIUM CHLORIDE 0.9 % IV SOLN: Freq: Once | INTRAVENOUS | Status: AC

## 2022-07-17 MED ORDER — SODIUM CHLORIDE 0.9 % IV SOLN
600.0000 mg/m2 | Freq: Once | INTRAVENOUS | Status: AC
  Administered 2022-07-17: 1500 mg via INTRAVENOUS
  Filled 2022-07-17: qty 75

## 2022-07-17 MED ORDER — DOXORUBICIN HCL CHEMO IV INJECTION 2 MG/ML
60.0000 mg/m2 | Freq: Once | INTRAVENOUS | Status: AC
  Administered 2022-07-17: 158 mg via INTRAVENOUS
  Filled 2022-07-17: qty 79

## 2022-07-17 MED ORDER — HEPARIN SOD (PORK) LOCK FLUSH 100 UNIT/ML IV SOLN
500.0000 [IU] | Freq: Once | INTRAVENOUS | Status: AC | PRN
  Administered 2022-07-17: 500 [IU]

## 2022-07-17 MED ORDER — SODIUM CHLORIDE 0.9 % IV SOLN
150.0000 mg | Freq: Once | INTRAVENOUS | Status: AC
  Administered 2022-07-17: 150 mg via INTRAVENOUS
  Filled 2022-07-17: qty 150

## 2022-07-17 MED ORDER — PALONOSETRON HCL INJECTION 0.25 MG/5ML
0.2500 mg | Freq: Once | INTRAVENOUS | Status: AC
  Administered 2022-07-17: 0.25 mg via INTRAVENOUS
  Filled 2022-07-17: qty 5

## 2022-07-17 NOTE — Patient Instructions (Signed)

## 2022-07-17 NOTE — Patient Instructions (Signed)
[facility]  Discharge Instructions: Thank you for choosing [facility] to provide your oncology and hematology care.   If you have a lab appointment with the Cancer Center, please go directly to the Cancer Center and check in at the registration area.   Wear comfortable clothing and clothing appropriate for easy access to any Portacath or PICC line.   We strive to give you quality time with your provider. You may need to reschedule your appointment if you arrive late (15 or more minutes).  Arriving late affects you and other patients whose appointments are after yours.  Also, if you miss three or more appointments without notifying the office, you may be dismissed from the clinic at the provider's discretion.      For prescription refill requests, have your pharmacy contact our office and allow 72 hours for refills to be completed.    Today you received the following chemotherapy and/or immunotherapy agents: Adriamycin/Cytoxan      To help prevent nausea and vomiting after your treatment, we encourage you to take your nausea medication as directed.  BELOW ARE SYMPTOMS THAT SHOULD BE REPORTED IMMEDIATELY: *FEVER GREATER THAN 100.4 F (38 C) OR HIGHER *CHILLS OR SWEATING *NAUSEA AND VOMITING THAT IS NOT CONTROLLED WITH YOUR NAUSEA MEDICATION *UNUSUAL SHORTNESS OF BREATH *UNUSUAL BRUISING OR BLEEDING *URINARY PROBLEMS (pain or burning when urinating, or frequent urination) *BOWEL PROBLEMS (unusual diarrhea, constipation, pain near the anus) TENDERNESS IN MOUTH AND THROAT WITH OR WITHOUT PRESENCE OF ULCERS (sore throat, sores in mouth, or a toothache) UNUSUAL RASH, SWELLING OR PAIN  UNUSUAL VAGINAL DISCHARGE OR ITCHING   Items with * indicate a potential emergency and should be followed up as soon as possible or go to the Emergency Department if any problems should occur.  Please show the CHEMOTHERAPY ALERT CARD or IMMUNOTHERAPY ALERT CARD  at check-in to the Emergency Department and triage nurse.  Should you have questions after your visit or need to cancel or reschedule your appointment, please contact [facility]  Dept: [phone]  and follow the prompts.  Office hours are 8:00 a.m. to 4:30 p.m. Monday - Friday. Please note that voicemails left after 4:00 p.m. may not be returned until the following business day.  We are closed weekends and major holidays. You have access to a nurse at all times for urgent questions. Please call the main number to the clinic Dept: [phone] and follow the prompts.   For any non-urgent questions, you may also contact your provider using MyChart. We now offer e-Visits for anyone 18 and older to request care online for non-urgent symptoms. For details visit [email or web address].   Also download the MyChart app! Go to the app store, search "MyChart", open the app, select [facility], and log in with your MyChart username and password.   

## 2022-07-17 NOTE — Assessment & Plan Note (Signed)
05/29/2022:Palpable abnormalities in the left breast led to mammograms which revealed 3 masses 0.7 cm at 1 o'clock position, 3 cm at 2 o'clock position, additional nodule 8 mm total span 5 cm, 5 abnormal axillary lymph nodes biopsy of the masses and the lymph node were positive for grade 3 IDC with high-grade DCIS with LVI, ER 95%, PR 40%, Ki-67 60%, HER2 2+ by IHC FISH negative  06/27/2022: CT CAP: No distant metastatic disease, left adnexal cystic lesion 4 cm (pelvic ultrasound ordered)   Treatment Plan: 1. Neoadjuvant chemotherapy with Adriamycin and Cytoxan dose dense 4 followed by Taxol weekly 12  2. Followed by breast conserving surgery with targeted axillary dissection 3. Followed by adjuvant radiation therapy 4.  Followed by antiestrogen therapy ----------------------------------------------------------------------------------------------------------------------------------------------- Current treatment: Cycle 3 Adriamycin and Cytoxan Chemo toxicities: Decrease in taste Alopecia Denied any nausea vomiting diarrhea or constipation Overall she tolerated her chemo extremely well. Return to clinic in 2 weeks for cycle 4

## 2022-07-18 ENCOUNTER — Telehealth: Payer: Self-pay

## 2022-07-18 NOTE — Telephone Encounter (Signed)
Received message from PA team stating pt would need to have MRI performed at [facility] d/t insurance. Contacted [facility] to get pt scheduled and they were able to accommodate an appt for this Friday 4/19. When they called the pt to schedule she declined stating she wanted to keep her appt as scheduled.  Attempted to call pt X2 to explain why she would need MRI at [facility]. She did not answer. LVM for call back.

## 2022-07-19 ENCOUNTER — Ambulatory Visit (HOSPITAL_COMMUNITY): Payer: Managed Care, Other (non HMO)

## 2022-07-19 ENCOUNTER — Inpatient Hospital Stay: Payer: Managed Care, Other (non HMO)

## 2022-07-19 VITALS — BP 139/84 | HR 96 | Temp 97.9°F | Resp 20

## 2022-07-19 DIAGNOSIS — C50812 Malignant neoplasm of overlapping sites of left female breast: Secondary | ICD-10-CM | POA: Diagnosis not present

## 2022-07-19 MED ORDER — PEGFILGRASTIM-CBQV 6 MG/0.6ML ~~LOC~~ SOSY
6.0000 mg | PREFILLED_SYRINGE | Freq: Once | SUBCUTANEOUS | Status: AC
  Administered 2022-07-19: 6 mg via SUBCUTANEOUS
  Filled 2022-07-19: qty 0.6

## 2022-07-20 ENCOUNTER — Ambulatory Visit
Admission: RE | Admit: 2022-07-20 | Discharge: 2022-07-20 | Disposition: A | Discharge status: Home / Self Care | Payer: Managed Care, Other (non HMO) | Source: Ambulatory Visit | Attending: Adult Health | Admitting: Adult Health

## 2022-07-20 DIAGNOSIS — C50812 Malignant neoplasm of overlapping sites of left female breast: Secondary | ICD-10-CM

## 2022-07-20 DIAGNOSIS — N9489 Other specified conditions associated with female genital organs and menstrual cycle: Secondary | ICD-10-CM

## 2022-07-20 MED ORDER — GADOPICLENOL 0.5 MMOL/ML IV SOLN
10.0000 mL | Freq: Once | INTRAVENOUS | Status: AC | PRN
  Administered 2022-07-20: 10 mL via INTRAVENOUS

## 2022-07-24 ENCOUNTER — Telehealth: Payer: Self-pay | Admitting: Hematology and Oncology

## 2022-07-24 ENCOUNTER — Inpatient Hospital Stay (HOSPITAL_BASED_OUTPATIENT_CLINIC_OR_DEPARTMENT_OTHER): Payer: Managed Care, Other (non HMO) | Admitting: Hematology and Oncology

## 2022-07-24 DIAGNOSIS — Z170 Estrogen receptor positive status [ER+]: Secondary | ICD-10-CM | POA: Diagnosis not present

## 2022-07-24 DIAGNOSIS — C50812 Malignant neoplasm of overlapping sites of left female breast: Secondary | ICD-10-CM | POA: Diagnosis not present

## 2022-07-24 NOTE — Telephone Encounter (Signed)
Scheduled appointments per WQ. Left voicemail. 

## 2022-07-24 NOTE — Progress Notes (Signed)
HEMATOLOGY-ONCOLOGY TELEPHONE VISIT PROGRESS NOTE  I connected with our patient on 07/24/22 at  8:00 AM EDT by telephone and verified that I am speaking with the correct person using two identifiers.  I discussed the limitations, risks, security and privacy concerns of performing an evaluation and management service by telephone and the availability of in person appointments.  I also discussed with the patient that there may be a patient responsible charge related to this service. The patient expressed understanding and agreed to proceed.   History of Present Illness: Follow-up by telephone to discuss the results of pelvic MRI  Oncology History  Malignant neoplasm of overlapping sites of left breast in female, estrogen receptor positive  05/29/2022 Initial Diagnosis   Palpable abnormalities in the left breast led to mammograms which revealed 3 masses 0.7 cm at 1 o'clock position, 3 cm at 2 o'clock position, additional nodule 8 mm total span 5 cm, 5 abnormal axillary lymph nodes biopsy of the masses and the lymph node were positive for grade 3 IDC with high-grade DCIS with LVI, ER 95%, PR 40%, Ki-67 60%, HER2 2+ by IHC FISH negative   06/15/2022 Cancer Staging   Staging form: Breast, AJCC 8th Edition - Clinical: Stage IIIB (cT3, cN3, cM0, G3, ER+, PR+, HER2-) - Signed by Loa Socks, NP on 06/15/2022 Stage prefix: Initial diagnosis Histologic grading system: 3 grade system   06/15/2022 Breast MRI   IMPRESSION: 1. Multiple sites of biopsy proven malignancy with in the lateral left breast. The overall conglomerate of suspicious findings spans approximately 4.4 x 3.5 x 3.5 cm, and contains all 3 post biopsy marking clips. 2. 10+ morphologically abnormal level I, II, III and Rotter's lymph nodes within the left axilla. 3. Diffuse skin and trabecular thickening on the left, consistent with lymphovascular spread of disease. 4. No MRI evidence of malignancy on the right.   06/20/2022 -   Chemotherapy   Patient is on Treatment Plan : BREAST ADJUVANT DOSE DENSE AC q14d / PACLitaxel q7d      Genetic Testing   [facility] Multi-Cancer Panel+RNA was Negative. Report date is 06/13/2022.  The Multi-Cancer + RNA Panel offered by [facility] includes sequencing and/or deletion/duplication analysis of the following 70 genes:  AIP*, ALK, APC*, ATM*, AXIN2*, BAP1*, BARD1*, BLM*, BMPR1A*, BRCA1*, BRCA2*, BRIP1*, CDC73*, CDH1*, CDK4, CDKN1B*, CDKN2A, CHEK2*, CTNNA1*, DICER1*, EPCAM (del/dup only), EGFR, FH*, FLCN*, GREM1 (promoter dup only), HOXB13, KIT, LZTR1, MAX*, MBD4, MEN1*, MET, MITF, MLH1*, MSH2*, MSH3*, MSH6*, MUTYH*, NF1*, NF2*, NTHL1*, PALB2*, PDGFRA, PMS2*, POLD1*, POLE*, POT1*, PRKAR1A*, PTCH1*, PTEN*, RAD51C*, RAD51D*, RB1*, RET, SDHA* (sequencing only), SDHAF2*, SDHB*, SDHC*, SDHD*, SMAD4*, SMARCA4*, SMARCB1*, SMARCE1*, STK11*, SUFU*, TMEM127*, TP53*, TSC1*, TSC2*, VHL*. RNA analysis is performed for * genes.     REVIEW OF SYSTEMS:   Constitutional: Denies fevers, chills or abnormal weight loss All other systems were reviewed with the patient and are negative. Observations/Objective:     Assessment Plan:  Malignant neoplasm of overlapping sites of left breast in female, estrogen receptor positive (HCC) 05/29/2022:Palpable abnormalities in the left breast led to mammograms which revealed 3 masses 0.7 cm at 1 o'clock position, 3 cm at 2 o'clock position, additional nodule 8 mm total span 5 cm, 5 abnormal axillary lymph nodes biopsy of the masses and the lymph node were positive for grade 3 IDC with high-grade DCIS with LVI, ER 95%, PR 40%, Ki-67 60%, HER2 2+ by IHC FISH negative  06/27/2022: CT CAP: No distant metastatic disease, left adnexal cystic lesion 4 cm (pelvic  ultrasound ordered)   Treatment Plan: 1. Neoadjuvant chemotherapy with Adriamycin and Cytoxan dose dense 4 followed by Taxol weekly 12  2. Followed by breast conserving surgery with targeted axillary dissection 3.  Followed by adjuvant radiation therapy 4.  Followed by antiestrogen therapy ----------------------------------------------------------------------------------------------------------------------------------------------- Current treatment: Completed 3 cycles of Adriamycin and Cytoxan Chemo toxicities: Decrease in taste Alopecia Nausea for the first 1 to 2 days but no vomiting Pelvic ultrasound revealed solid and cystic mass within the left ovary measuring 6.4 cm, pelvic MRI has been ordered.  I sent a prescription for Xanax because she has quite a bit of anxiety. Constipation: Improved with stool softeners   07/20/2022: MRI pelvis: Left ovary: Appears normal.  A tubular cystic lesion is seen in the left adnexa adjacent to the ovary 5 x 2.8 cm consistent with mild hydrosalpinx  Plan: I recommended that the patient should discuss this with her gynecologist about her options. Fortunately there is no concern for cancer.  Return to clinic for cycle 4   I discussed the assessment and treatment plan with the patient. The patient was provided an opportunity to ask questions and all were answered. The patient agreed with the plan and demonstrated an understanding of the instructions. The patient was advised to call back or seek an in-person evaluation if the symptoms worsen or if the condition fails to improve as anticipated.   I provided 12 minutes of non-face-to-face time during this encounter.  This includes time for charting and coordination of care   Tamsen Meek, MD

## 2022-07-24 NOTE — Assessment & Plan Note (Addendum)
05/29/2022:Palpable abnormalities in the left breast led to mammograms which revealed 3 masses 0.7 cm at 1 o'clock position, 3 cm at 2 o'clock position, additional nodule 8 mm total span 5 cm, 5 abnormal axillary lymph nodes biopsy of the masses and the lymph node were positive for grade 3 IDC with high-grade DCIS with LVI, ER 95%, PR 40%, Ki-67 60%, HER2 2+ by IHC FISH negative  06/27/2022: CT CAP: No distant metastatic disease, left adnexal cystic lesion 4 cm (pelvic ultrasound ordered)   Treatment Plan: 1. Neoadjuvant chemotherapy with Adriamycin and Cytoxan dose dense 4 followed by Taxol weekly 12  2. Followed by breast conserving surgery with targeted axillary dissection 3. Followed by adjuvant radiation therapy 4.  Followed by antiestrogen therapy ----------------------------------------------------------------------------------------------------------------------------------------------- Current treatment: Completed 3 cycles of Adriamycin and Cytoxan Chemo toxicities: Decrease in taste Alopecia Nausea for the first 1 to 2 days but no vomiting Pelvic ultrasound revealed solid and cystic mass within the left ovary measuring 6.4 cm, pelvic MRI has been ordered.  I sent a prescription for Xanax because she has quite a bit of anxiety. Constipation: Improved with stool softeners   07/20/2022: MRI pelvis: Left ovary: Appears normal.  A tubular cystic lesion is seen in the left adnexa adjacent to the ovary 5 x 2.8 cm consistent with mild hydrosalpinx  Plan: I recommended that the patient should discuss this with her gynecologist about her options. Fortunately there is no concern for cancer.  Return to clinic for cycle 4

## 2022-07-25 ENCOUNTER — Other Ambulatory Visit: Payer: Self-pay

## 2022-07-27 ENCOUNTER — Telehealth: Payer: Self-pay | Admitting: Hematology and Oncology

## 2022-07-27 NOTE — Telephone Encounter (Signed)
Scheduled appointments per WQ. Left voicemail. 

## 2022-07-28 ENCOUNTER — Other Ambulatory Visit: Payer: Self-pay

## 2022-07-30 MED FILL — Fosaprepitant Dimeglumine For IV Infusion 150 MG (Base Eq): INTRAVENOUS | Qty: 5 | Status: AC

## 2022-07-30 MED FILL — Dexamethasone Sodium Phosphate Inj 100 MG/10ML: INTRAMUSCULAR | Qty: 1 | Status: AC

## 2022-07-31 ENCOUNTER — Encounter: Payer: Self-pay | Admitting: Emergency Medicine

## 2022-07-31 ENCOUNTER — Inpatient Hospital Stay: Payer: Managed Care, Other (non HMO)

## 2022-07-31 ENCOUNTER — Other Ambulatory Visit: Payer: Self-pay

## 2022-07-31 ENCOUNTER — Inpatient Hospital Stay (HOSPITAL_BASED_OUTPATIENT_CLINIC_OR_DEPARTMENT_OTHER): Payer: Managed Care, Other (non HMO) | Admitting: Adult Health

## 2022-07-31 ENCOUNTER — Other Ambulatory Visit: Payer: Self-pay | Admitting: *Deleted

## 2022-07-31 ENCOUNTER — Encounter: Payer: Self-pay | Admitting: Adult Health

## 2022-07-31 ENCOUNTER — Other Ambulatory Visit (HOSPITAL_COMMUNITY): Payer: Self-pay

## 2022-07-31 VITALS — BP 123/70 | HR 94 | Temp 97.7°F | Resp 18

## 2022-07-31 DIAGNOSIS — C50812 Malignant neoplasm of overlapping sites of left female breast: Secondary | ICD-10-CM

## 2022-07-31 DIAGNOSIS — Z170 Estrogen receptor positive status [ER+]: Secondary | ICD-10-CM | POA: Diagnosis not present

## 2022-07-31 DIAGNOSIS — Z95828 Presence of other vascular implants and grafts: Secondary | ICD-10-CM

## 2022-07-31 HISTORY — DX: Presence of other vascular implants and grafts: Z95.828

## 2022-07-31 LAB — CMP (CANCER CENTER ONLY)
ALT: 17 U/L (ref 0–44)
AST: 14 U/L — ABNORMAL LOW (ref 15–41)
Albumin: 3.8 g/dL (ref 3.5–5.0)
Alkaline Phosphatase: 66 U/L (ref 38–126)
Anion gap: 7 (ref 5–15)
BUN: 11 mg/dL (ref 6–20)
CO2: 25 mmol/L (ref 22–32)
Calcium: 8.8 mg/dL — ABNORMAL LOW (ref 8.9–10.3)
Chloride: 103 mmol/L (ref 98–111)
Creatinine: 0.69 mg/dL (ref 0.44–1.00)
GFR, Estimated: >60 mL/min (ref >60)
Glucose, Bld: 216 mg/dL — ABNORMAL HIGH (ref 70–99)
Potassium: 3.9 mmol/L (ref 3.5–5.1)
Sodium: 135 mmol/L (ref 135–145)
Total Bilirubin: 0.3 mg/dL (ref 0.3–1.2)
Total Protein: 6.4 g/dL — ABNORMAL LOW (ref 6.5–8.1)

## 2022-07-31 LAB — CBC WITH DIFFERENTIAL (CANCER CENTER ONLY)
Abs Immature Granulocytes: 0.38 10*3/uL — ABNORMAL HIGH (ref 0.00–0.07)
Basophils Absolute: 0.1 10*3/uL (ref 0.0–0.1)
Basophils Relative: 1 %
Eosinophils Absolute: 0 10*3/uL (ref 0.0–0.5)
Eosinophils Relative: 0 %
HCT: 31.7 % — ABNORMAL LOW (ref 36.0–46.0)
Hemoglobin: 10.8 g/dL — ABNORMAL LOW (ref 12.0–15.0)
Immature Granulocytes: 6 %
Lymphocytes Relative: 14 %
Lymphs Abs: 0.9 10*3/uL (ref 0.7–4.0)
MCH: 27.1 pg (ref 26.0–34.0)
MCHC: 34.1 g/dL (ref 30.0–36.0)
MCV: 79.6 fL — ABNORMAL LOW (ref 80.0–100.0)
Monocytes Absolute: 1.1 10*3/uL — ABNORMAL HIGH (ref 0.1–1.0)
Monocytes Relative: 17 %
Neutro Abs: 3.9 10*3/uL (ref 1.7–7.7)
Neutrophils Relative %: 62 %
Platelet Count: 140 10*3/uL — ABNORMAL LOW (ref 150–400)
RBC: 3.98 MIL/uL (ref 3.87–5.11)
RDW: 15.5 % (ref 11.5–15.5)
WBC Count: 6.4 10*3/uL (ref 4.0–10.5)
nRBC: 0 % (ref 0.0–0.2)

## 2022-07-31 LAB — PREGNANCY, URINE: Preg Test, Ur: NEGATIVE

## 2022-07-31 MED ORDER — PALONOSETRON HCL INJECTION 0.25 MG/5ML
0.2500 mg | Freq: Once | INTRAVENOUS | Status: AC
  Administered 2022-07-31: 0.25 mg via INTRAVENOUS
  Filled 2022-07-31: qty 5

## 2022-07-31 MED ORDER — DOXORUBICIN HCL CHEMO IV INJECTION 2 MG/ML
60.0000 mg/m2 | Freq: Once | INTRAVENOUS | Status: AC
  Administered 2022-07-31: 158 mg via INTRAVENOUS
  Filled 2022-07-31: qty 79

## 2022-07-31 MED ORDER — SODIUM CHLORIDE 0.9 % IV SOLN
150.0000 mg | Freq: Once | INTRAVENOUS | Status: AC
  Administered 2022-07-31: 150 mg via INTRAVENOUS
  Filled 2022-07-31: qty 150

## 2022-07-31 MED ORDER — SODIUM CHLORIDE 0.9 % IV SOLN
600.0000 mg/m2 | Freq: Once | INTRAVENOUS | Status: AC
  Administered 2022-07-31: 1500 mg via INTRAVENOUS
  Filled 2022-07-31: qty 75

## 2022-07-31 MED ORDER — SODIUM CHLORIDE 0.9% FLUSH
10.0000 mL | INTRAVENOUS | Status: DC | PRN
  Administered 2022-07-31: 10 mL

## 2022-07-31 MED ORDER — SODIUM CHLORIDE 0.9 % IV SOLN: Freq: Once | INTRAVENOUS | Status: AC

## 2022-07-31 MED ORDER — SODIUM CHLORIDE 0.9 % IV SOLN
10.0000 mg | Freq: Once | INTRAVENOUS | Status: AC
  Administered 2022-07-31: 10 mg via INTRAVENOUS
  Filled 2022-07-31: qty 10

## 2022-07-31 MED ORDER — LIDOCAINE VISCOUS HCL 2 % MT SOLN
5.0000 mL | Freq: Three times a day (TID) | OROMUCOSAL | 0 refills | Status: DC | PRN
  Filled 2022-07-31: qty 120, 8d supply, fill #0

## 2022-07-31 MED ORDER — HEPARIN SOD (PORK) LOCK FLUSH 100 UNIT/ML IV SOLN
500.0000 [IU] | Freq: Once | INTRAVENOUS | Status: AC | PRN
  Administered 2022-07-31: 500 [IU]

## 2022-07-31 NOTE — Assessment & Plan Note (Addendum)
Delylah is a 37 year old woman with stage IIIB ER/PR positive breast cancer diagnosed in 06/2022 who just began neoadjuvant chemotherapy last week.    Treatment Plan:  1. Neoadjuvant chemotherapy with Adriamycin and Cytoxan dose dense 4 followed by Taxol weekly 12  2. Staging Scans with CT c/a/p and bone scan 3. Followed by breast conserving surgery with targeted axillary dissection 4. Followed by adjuvant radiation therapy 5.  Followed by antiestrogen therapy  ____________________________________________________  Tawanna Cooler toxicities:  Fatigue: managing with energy conservation Treatment related anemia: mild, hemoglobin 10.8, monitoring closely Nausea: managed with compazine Mucositis: Magic Mouthwash script to be sent to [facility] on [address] by [employer] my nurse  Trinetta is overall tolerating treatment moderately well.  She will return to clinic in 2 days for her injection and in 2 weeks for labs, follow-up, and to begin adjuvant Taxol.

## 2022-07-31 NOTE — Research (Signed)
[id number], ICE COMPRESS: RANDOMIZED TRIAL OF LIMB CRYOCOMPRESSION VERSUS CONTINUOUS COMPRESSION VERSUS LOW CYCLIC COMPRESSION FOR THE PREVENTION OF TAXANE-INDUCED PERIPHERAL NEUROPATHY  INTRO TO STUDY/CONSENT FORMS  Patient Lynn [name] was identified by Clinical Research as a potential candidate for the above listed study.  This Clinical Research Nurse met with Lynn [name], [id number], on 07/31/22 in a manner and location that ensures patient privacy to discuss participation in the above listed research study.  Patient is Unaccompanied.  A copy of the informed consent document and separate HIPAA Authorization was provided to the patient.  Patient reads, speaks, and understands [place].   Patient was provided with the business card of this Nurse and encouraged to contact the research team with any questions.  Approximately 15 minutes were spent with the patient reviewing the informed consent documents.  Patient was provided the option of taking informed consent documents home to review and was encouraged to review at their convenience with their support network, including other care providers. Patient took the consent documents home to review.  Will f/u with patient tomorrow on eligibility and interest.  Lurena Joiner 'Warden FillersDairl Ponder, RN, BSN, [facility] Clinical Research Nurse I 07/31/22 10:00 AM

## 2022-07-31 NOTE — Progress Notes (Signed)
[facility] Cancer Follow up:    Lynn Scull, NP [address] [place] [address]   DIAGNOSIS:  Cancer Staging  Malignant neoplasm of overlapping sites of left breast in female, estrogen receptor positive (HCC) Staging form: Breast, AJCC 8th Edition - Clinical stage from 06/04/2022: Stage IIB (cT2, cN1(f), cM0, G3, ER+, PR+, HER2-) - Signed by Ronny Bacon, PA-C on 06/04/2022 Stage prefix: Initial diagnosis Method of lymph node assessment: Core biopsy Histologic grading system: 3 grade system - Clinical: Stage IIIB (cT3, cN3, cM0, G3, ER+, PR+, HER2-) - Signed by Loa Socks, NP on 06/15/2022 Stage prefix: Initial diagnosis Histologic grading system: 3 grade system   SUMMARY OF ONCOLOGIC HISTORY: Oncology History  Malignant neoplasm of overlapping sites of left breast in female, estrogen receptor positive (HCC)  05/29/2022 Initial Diagnosis   Palpable abnormalities in the left breast led to mammograms which revealed 3 masses 0.7 cm at 1 o'clock position, 3 cm at 2 o'clock position, additional nodule 8 mm total span 5 cm, 5 abnormal axillary lymph nodes biopsy of the masses and the lymph node were positive for grade 3 IDC with high-grade DCIS with LVI, ER 95%, PR 40%, Ki-67 60%, HER2 2+ by IHC FISH negative   06/15/2022 Cancer Staging   Staging form: Breast, AJCC 8th Edition - Clinical: Stage IIIB (cT3, cN3, cM0, G3, ER+, PR+, HER2-) - Signed by Loa Socks, NP on 06/15/2022 Stage prefix: Initial diagnosis Histologic grading system: 3 grade system   06/15/2022 Breast MRI   IMPRESSION: 1. Multiple sites of biopsy proven malignancy with in the lateral left breast. The overall conglomerate of suspicious findings spans approximately 4.4 x 3.5 x 3.5 cm, and contains all 3 post biopsy marking clips. 2. 10+ morphologically abnormal level I, II, III and Rotter's lymph nodes within the left axilla. 3. Diffuse skin and trabecular  thickening on the left, consistent with lymphovascular spread of disease. 4. No MRI evidence of malignancy on the right.   06/20/2022 -  Chemotherapy   Patient is on Treatment Plan : BREAST ADJUVANT DOSE DENSE AC q14d / PACLitaxel q7d      Genetic Testing   [facility] Multi-Cancer Panel+RNA was Negative. Report date is 06/13/2022.  The Multi-Cancer + RNA Panel offered by [facility] includes sequencing and/or deletion/duplication analysis of the following 70 genes:  AIP*, ALK, APC*, ATM*, AXIN2*, BAP1*, BARD1*, BLM*, BMPR1A*, BRCA1*, BRCA2*, BRIP1*, CDC73*, CDH1*, CDK4, CDKN1B*, CDKN2A, CHEK2*, CTNNA1*, DICER1*, EPCAM (del/dup only), EGFR, FH*, FLCN*, GREM1 (promoter dup only), HOXB13, KIT, LZTR1, MAX*, MBD4, MEN1*, MET, MITF, MLH1*, MSH2*, MSH3*, MSH6*, MUTYH*, NF1*, NF2*, NTHL1*, PALB2*, PDGFRA, PMS2*, POLD1*, POLE*, POT1*, PRKAR1A*, PTCH1*, PTEN*, RAD51C*, RAD51D*, RB1*, RET, SDHA* (sequencing only), SDHAF2*, SDHB*, SDHC*, SDHD*, SMAD4*, SMARCA4*, SMARCB1*, SMARCE1*, STK11*, SUFU*, TMEM127*, TP53*, TSC1*, TSC2*, VHL*. RNA analysis is performed for * genes.     CURRENT THERAPY: Adriamycin/Cytoxan  INTERVAL HISTORY: Lynn [name] 37 y.o. female returns for f/u prior to receiving her fourth cycle of adriamycin and cytoxan neoadjuvantly.  She is doing well today.  She had nausea after cycle 2 and so with cycle 3 she took Compazine when she left receiving her third cycle of treatment and she had much less nausea after this cycle.  She endorses fatigue and generalized weakness and is managing this with energy conservation.  She also has had some blisters in her mouth that she has been using her oncology mouthwash for and this is resolved.   Patient Active Problem List  Diagnosis Date Noted   Port-A-Cath in place 07/31/2022   Genetic testing 06/15/2022   Anxiety and depression 06/11/2022   Malignant neoplasm of overlapping sites of left breast in female, estrogen receptor positive (HCC) 06/04/2022    Syncope and collapse 03/12/2022   Umbilical hernia without obstruction and without gangrene 03/12/2022   Vitamin D deficiency 03/12/2022   Cervical radicular pain 12/07/2021   Snoring 12/07/2021   Other fatigue 12/07/2021   Chest pain 08/15/2021   Epigastric pain 08/15/2021   Acute pain of both shoulders 04/21/2021   Nerve pain 04/21/2021   Morbid obesity (HCC) 10/13/2020   Essential hypertension 03/12/2019   Chronic tachycardia 03/12/2019   Generalized anxiety disorder with panic attacks 03/12/2019   Family history of heart attack 03/12/2019   LVH (left ventricular hypertrophy) due to hypertensive disease, without heart failure 03/12/2019   Type 2 diabetes mellitus (HCC) 03/12/2019    is allergic to macrobid [nitrofurantoin macrocrystal] and ibuprofen.  MEDICAL HISTORY: Past Medical History:  Diagnosis Date   Cancer (HCC) 2024   L breast   Carpal tunnel syndrome, left    Diabetes (HCC)    Hypertension    Iron deficiency    Panic attacks    PCOS (polycystic ovarian syndrome)    Tachycardia     SURGICAL HISTORY: Past Surgical History:  Procedure Laterality Date   ADENOIDECTOMY     BREAST BIOPSY Left 05/29/2022   [place] LT BREAST BX W LOC DEV 1ST LESION IMG BX SPEC US GUIDE 05/29/2022 [facility]   BREAST BIOPSY Left 05/29/2022   [place] LT BREAST BX W LOC DEV EA ADD LESION IMG BX SPEC US GUIDE 05/29/2022 [facility]   BREAST BIOPSY Left 06/06/2022   MM LT BREAST BX W LOC DEV 1ST LESION IMAGE BX SPEC STEREO GUIDE 06/06/2022 [facility]   CESAREAN SECTION     PORTACATH PLACEMENT N/A 06/19/2022   Procedure: INSERTION PORT-A-CATH;  Surgeon: Emelia Loron, MD;  Location: [facility] OR;  Service: General;  Laterality: N/A;   TONSILLECTOMY      SOCIAL HISTORY: Social History   Socioeconomic History   Marital status: Significant Other    Spouse name: Not on file   Number of children: 1   Years of education: some college   Highest education level: Not on file   Occupational History   Not on file  Tobacco Use   Smoking status: Former    Packs/day: 1.00    Years: 7.00    Additional pack years: 0.00    Total pack years: 7.00    Types: Cigarettes, E-cigarettes    Quit date: 10/29/2021    Years since quitting: 0.7   Smokeless tobacco: Never  Vaping Use   Vaping Use: Former  Substance and Sexual Activity   Alcohol use: Yes    Comment: rarely   Drug use: No   Sexual activity: Yes    Birth control/protection: Condom  Other Topics Concern   Not on file  Social History Narrative      Social Determinants of Health   Financial Resource Strain: Not on file  Food Insecurity: Not on file  Transportation Needs: Not on file  Physical Activity: Not on file  Stress: Not on file  Social Connections: Not on file  Intimate Partner Violence: Not on file    FAMILY HISTORY: Family History  Problem Relation Age of Onset   Heart disease Mother    Diabetes Mother    Hypertension Mother    Kidney disease Mother  Heart failure Mother    Heart attack Mother 70   Arthritis Mother    Asthma Mother    COPD Mother    Depression Mother    Hyperlipidemia Mother    Mental illness Mother    Bladder Cancer Mother    Heart disease Father    Heart attack Father 25   COPD Father    Hyperlipidemia Father    Hypertension Father    Mental illness Father    Arthritis Sister    Depression Sister    Diabetes Sister    Hypertension Sister    Mental illness Sister    Miscarriages / Stillbirths Sister    Alcohol abuse Sister    Depression Sister    Diabetes Sister    Heart disease Sister    Heart attack Sister 17   Arthritis Sister    Kidney disease Sister    Transient ischemic attack Sister    Bladder Cancer Maternal Aunt    Colon cancer Maternal Aunt 54 - 73   Other Maternal Aunt 45 - 59       brain tumor (unknown if benign or malignant)   Lung cancer Paternal Aunt 36   Melanoma Paternal Aunt 2 - 49   Heart failure Maternal Grandmother     Arthritis Maternal Grandmother    Alcohol abuse Maternal Grandmother    COPD Maternal Grandmother    Diabetes Maternal Grandmother    Hearing loss Maternal Grandmother    Heart disease Maternal Grandmother    Hyperlipidemia Maternal Grandmother    Hypertension Maternal Grandmother    Stroke Maternal Grandmother    Miscarriages / Stillbirths Maternal Grandmother    Mental illness Maternal Grandmother    Heart attack Maternal Grandmother    Heart failure Maternal Grandfather    Alcohol abuse Maternal Grandfather    Arthritis Maternal Grandfather    Early death Maternal Grandfather    Heart disease Maternal Grandfather    Hyperlipidemia Maternal Grandfather    Hypertension Maternal Grandfather    Heart attack Maternal Grandfather 60   Mental illness Maternal Grandfather    Heart failure Paternal Grandmother    Arthritis Paternal Grandmother    Diabetes Paternal Grandmother    Heart attack Paternal Grandmother    Miscarriages / Stillbirths Paternal Grandmother    Mental illness Paternal Grandmother    Kidney disease Paternal Grandmother    Intellectual disability Paternal Grandmother    Hypertension Paternal Grandmother    Hyperlipidemia Paternal Grandmother    Heart disease Paternal Grandmother    Heart failure Paternal Grandfather    Arthritis Paternal Grandfather    Heart attack Paternal Grandfather    Mental illness Paternal Grandfather    Hearing loss Paternal Grandfather    Heart disease Paternal Grandfather    Hyperlipidemia Paternal Grandfather    Hypertension Paternal Grandfather     Review of Systems  Constitutional:  Positive for fatigue. Negative for appetite change, chills, fever and unexpected weight change.  HENT:   Positive for mouth sores. Negative for hearing loss, lump/mass and trouble swallowing.   Eyes:  Negative for eye problems and icterus.  Respiratory:  Negative for chest tightness, cough and shortness of breath.   Cardiovascular:  Negative for  chest pain, leg swelling and palpitations.  Gastrointestinal:  Negative for abdominal distention, abdominal pain, constipation, diarrhea, nausea and vomiting.  Endocrine: Negative for hot flashes.  Genitourinary:  Negative for difficulty urinating.   Musculoskeletal:  Negative for arthralgias.  Skin:  Negative for itching and rash.  Neurological:  Negative for dizziness, extremity weakness, headaches and numbness.  Hematological:  Negative for adenopathy. Does not bruise/bleed easily.  Psychiatric/Behavioral:  Negative for depression. The patient is not nervous/anxious.       PHYSICAL EXAMINATION    Vitals:   07/31/22 0838  BP: (!) 143/76  Pulse: 94  Resp: 18  Temp: (!) 97.5 F (36.4 C)  SpO2: 98%    Physical Exam Constitutional:      General: She is not in acute distress.    Appearance: Normal appearance. She is not toxic-appearing.  HENT:     Head: Normocephalic and atraumatic.     Mouth/Throat:     Mouth: Mucous membranes are moist.     Pharynx: Oropharynx is clear. No oropharyngeal exudate or posterior oropharyngeal erythema.  Eyes:     General: No scleral icterus. Cardiovascular:     Rate and Rhythm: Normal rate and regular rhythm.     Pulses: Normal pulses.     Heart sounds: Normal heart sounds.  Pulmonary:     Effort: Pulmonary effort is normal.     Breath sounds: Normal breath sounds.  Abdominal:     General: Abdomen is flat. Bowel sounds are normal. There is no distension.     Palpations: Abdomen is soft.     Tenderness: There is no abdominal tenderness.  Musculoskeletal:        General: No swelling.     Cervical back: Neck supple.  Lymphadenopathy:     Cervical: No cervical adenopathy.  Skin:    General: Skin is warm and dry.     Findings: No rash.  Neurological:     General: No focal deficit present.     Mental Status: She is alert.  Psychiatric:        Mood and Affect: Mood normal.        Behavior: Behavior normal.     LABORATORY  DATA:  CBC    Component Value Date/Time   WBC 6.4 07/31/2022 0815   WBC 8.0 02/14/2022 1027   RBC 3.98 07/31/2022 0815   HGB 10.8 (L) 07/31/2022 0815   HCT 31.7 (L) 07/31/2022 0815   PLT 140 (L) 07/31/2022 0815   MCV 79.6 (L) 07/31/2022 0815   MCH 27.1 07/31/2022 0815   MCHC 34.1 07/31/2022 0815   RDW 15.5 07/31/2022 0815   LYMPHSABS 0.9 07/31/2022 0815   MONOABS 1.1 (H) 07/31/2022 0815   EOSABS 0.0 07/31/2022 0815   BASOSABS 0.1 07/31/2022 0815    CMP     Component Value Date/Time   NA 136 07/17/2022 0812   K 4.0 07/17/2022 0812   CL 103 07/17/2022 0812   CO2 27 07/17/2022 0812   GLUCOSE 179 (H) 07/17/2022 0812   BUN 10 07/17/2022 0812   CREATININE 0.74 07/17/2022 0812   CALCIUM 8.8 (L) 07/17/2022 0812   PROT 6.6 07/17/2022 0812   ALBUMIN 3.8 07/17/2022 0812   AST 19 07/17/2022 0812   ALT 22 07/17/2022 0812   ALKPHOS 63 07/17/2022 0812   BILITOT 0.4 07/17/2022 0812   GFRNONAA >60 07/17/2022 0812   GFRAA >60 01/02/2020 2048      ASSESSMENT and THERAPY PLAN:   Malignant neoplasm of overlapping sites of left breast in female, estrogen receptor positive (HCC) Lynn [name] is a 38 year old woman with stage IIIB ER/PR positive breast cancer diagnosed in 06/2022 who just began neoadjuvant chemotherapy last week.    Treatment Plan:  1. Neoadjuvant chemotherapy with Adriamycin and Cytoxan dose dense 4 followed  by Taxol weekly 12  2. Staging Scans with CT c/a/p and bone scan 3. Followed by breast conserving surgery with targeted axillary dissection 4. Followed by adjuvant radiation therapy 5.  Followed by antiestrogen therapy  ____________________________________________________  Tawanna Cooler toxicities:  Fatigue: managing with energy conservation Treatment related anemia: mild, hemoglobin 10.8, monitoring closely Nausea: managed with compazine Mucositis: Magic Mouthwash script to be sent to [facility] on [address] by [employer] my nurse  Lynn [name] is overall tolerating  treatment moderately well.  She will return to clinic in 2 days for her injection and in 2 weeks for labs, follow-up, and to begin adjuvant Taxol.  All questions were answered. The patient knows to call the clinic with any problems, questions or concerns. We can certainly see the patient much sooner if necessary.  Total encounter time:20 minutes*in face-to-face visit time, chart review, lab review, care coordination, order entry, and documentation of the encounter time.    Lillard Anes, NP 07/31/22 9:02 AM Medical Oncology and Hematology [facility] [address] [place], [place] [address] Tel. [phone]    Fax. [phone]  *Total Encounter Time as defined by the Centers for Medicare and Medicaid Services includes, in addition to the face-to-face time of a patient visit (documented in the note above) non-face-to-face time: obtaining and reviewing outside history, ordering and reviewing medications, tests or procedures, care coordination (communications with other health care professionals or caregivers) and documentation in the medical record.

## 2022-07-31 NOTE — Patient Instructions (Signed)
[facility]  Discharge Instructions: Thank you for choosing [facility] to provide your oncology and hematology care.   If you have a lab appointment with the Cancer Center, please go directly to the Cancer Center and check in at the registration area.   Wear comfortable clothing and clothing appropriate for easy access to any Portacath or PICC line.   We strive to give you quality time with your provider. You may need to reschedule your appointment if you arrive late (15 or more minutes).  Arriving late affects you and other patients whose appointments are after yours.  Also, if you miss three or more appointments without notifying the office, you may be dismissed from the clinic at the provider's discretion.      For prescription refill requests, have your pharmacy contact our office and allow 72 hours for refills to be completed.    Today you received the following chemotherapy and/or immunotherapy agents: Adriamycin/Cytoxan      To help prevent nausea and vomiting after your treatment, we encourage you to take your nausea medication as directed.  BELOW ARE SYMPTOMS THAT SHOULD BE REPORTED IMMEDIATELY: *FEVER GREATER THAN 100.4 F (38 C) OR HIGHER *CHILLS OR SWEATING *NAUSEA AND VOMITING THAT IS NOT CONTROLLED WITH YOUR NAUSEA MEDICATION *UNUSUAL SHORTNESS OF BREATH *UNUSUAL BRUISING OR BLEEDING *URINARY PROBLEMS (pain or burning when urinating, or frequent urination) *BOWEL PROBLEMS (unusual diarrhea, constipation, pain near the anus) TENDERNESS IN MOUTH AND THROAT WITH OR WITHOUT PRESENCE OF ULCERS (sore throat, sores in mouth, or a toothache) UNUSUAL RASH, SWELLING OR PAIN  UNUSUAL VAGINAL DISCHARGE OR ITCHING   Items with * indicate a potential emergency and should be followed up as soon as possible or go to the Emergency Department if any problems should occur.  Please show the CHEMOTHERAPY ALERT CARD or IMMUNOTHERAPY ALERT CARD  at check-in to the Emergency Department and triage nurse.  Should you have questions after your visit or need to cancel or reschedule your appointment, please contact [facility]  Dept: [phone]  and follow the prompts.  Office hours are 8:00 a.m. to 4:30 p.m. Monday - Friday. Please note that voicemails left after 4:00 p.m. may not be returned until the following business day.  We are closed weekends and major holidays. You have access to a nurse at all times for urgent questions. Please call the main number to the clinic Dept: [phone] and follow the prompts.   For any non-urgent questions, you may also contact your provider using MyChart. We now offer e-Visits for anyone 18 and older to request care online for non-urgent symptoms. For details visit [email or web address].   Also download the MyChart app! Go to the app store, search "MyChart", open the app, select [facility], and log in with your MyChart username and password.   

## 2022-08-01 ENCOUNTER — Inpatient Hospital Stay: Payer: Managed Care, Other (non HMO)

## 2022-08-01 ENCOUNTER — Telehealth: Payer: Self-pay | Admitting: Emergency Medicine

## 2022-08-01 ENCOUNTER — Inpatient Hospital Stay: Payer: Managed Care, Other (non HMO) | Admitting: Adult Health

## 2022-08-01 NOTE — Telephone Encounter (Signed)
[id number], ICE COMPRESS: RANDOMIZED TRIAL OF LIMB CRYOCOMPRESSION VERSUS CONTINUOUS COMPRESSION VERSUS LOW CYCLIC COMPRESSION FOR THE PREVENTION  OF TAXANE-INDUCED PERIPHERAL NEUROPATHY  Called to f/u with patient on study.  Patient had reported during 4/30 visit that she experiences numbness/sensation alteration in both feet related to diabetes.  This makes her ineligible for the study.  Patient verbalized understanding, denies any questions or concerns at this time.  Lurena Joiner '[employer]' Dairl Ponder, RN, BSN, [facility] Clinical Research Nurse I 08/01/22 9:25 AM

## 2022-08-02 ENCOUNTER — Other Ambulatory Visit (HOSPITAL_COMMUNITY): Payer: Self-pay

## 2022-08-02 ENCOUNTER — Inpatient Hospital Stay: Payer: Managed Care, Other (non HMO) | Attending: Hematology and Oncology

## 2022-08-02 VITALS — BP 116/73 | HR 88 | Temp 98.0°F | Resp 20

## 2022-08-02 DIAGNOSIS — Z5111 Encounter for antineoplastic chemotherapy: Secondary | ICD-10-CM | POA: Diagnosis not present

## 2022-08-02 DIAGNOSIS — Z853 Personal history of malignant neoplasm of breast: Secondary | ICD-10-CM | POA: Insufficient documentation

## 2022-08-02 DIAGNOSIS — I10 Essential (primary) hypertension: Secondary | ICD-10-CM | POA: Diagnosis not present

## 2022-08-02 DIAGNOSIS — K5900 Constipation, unspecified: Secondary | ICD-10-CM | POA: Diagnosis not present

## 2022-08-02 DIAGNOSIS — Z79899 Other long term (current) drug therapy: Secondary | ICD-10-CM | POA: Diagnosis not present

## 2022-08-02 DIAGNOSIS — E119 Type 2 diabetes mellitus without complications: Secondary | ICD-10-CM | POA: Insufficient documentation

## 2022-08-02 DIAGNOSIS — D61818 Other pancytopenia: Secondary | ICD-10-CM | POA: Insufficient documentation

## 2022-08-02 DIAGNOSIS — Z170 Estrogen receptor positive status [ER+]: Secondary | ICD-10-CM | POA: Insufficient documentation

## 2022-08-02 DIAGNOSIS — K649 Unspecified hemorrhoids: Secondary | ICD-10-CM | POA: Diagnosis not present

## 2022-08-02 DIAGNOSIS — R0981 Nasal congestion: Secondary | ICD-10-CM | POA: Diagnosis not present

## 2022-08-02 DIAGNOSIS — C50812 Malignant neoplasm of overlapping sites of left female breast: Secondary | ICD-10-CM | POA: Diagnosis present

## 2022-08-02 MED ORDER — PEGFILGRASTIM-CBQV 6 MG/0.6ML ~~LOC~~ SOSY
6.0000 mg | PREFILLED_SYRINGE | Freq: Once | SUBCUTANEOUS | Status: AC
  Administered 2022-08-02: 6 mg via SUBCUTANEOUS
  Filled 2022-08-02: qty 0.6

## 2022-08-03 ENCOUNTER — Inpatient Hospital Stay: Payer: Managed Care, Other (non HMO)

## 2022-08-08 ENCOUNTER — Inpatient Hospital Stay: Payer: Managed Care, Other (non HMO) | Admitting: Physician Assistant

## 2022-08-08 ENCOUNTER — Other Ambulatory Visit: Payer: Self-pay

## 2022-08-08 ENCOUNTER — Telehealth: Payer: Self-pay

## 2022-08-08 VITALS — BP 127/85 | HR 89 | Temp 97.8°F | Resp 18 | Wt 317.7 lb

## 2022-08-08 DIAGNOSIS — C50812 Malignant neoplasm of overlapping sites of left female breast: Secondary | ICD-10-CM

## 2022-08-08 DIAGNOSIS — Z95828 Presence of other vascular implants and grafts: Secondary | ICD-10-CM | POA: Diagnosis not present

## 2022-08-08 DIAGNOSIS — D61818 Other pancytopenia: Secondary | ICD-10-CM | POA: Diagnosis not present

## 2022-08-08 DIAGNOSIS — R0981 Nasal congestion: Secondary | ICD-10-CM | POA: Diagnosis not present

## 2022-08-08 DIAGNOSIS — Z170 Estrogen receptor positive status [ER+]: Secondary | ICD-10-CM

## 2022-08-08 LAB — CMP (CANCER CENTER ONLY)
ALT: 14 U/L (ref 0–44)
AST: 12 U/L — ABNORMAL LOW (ref 15–41)
Albumin: 4 g/dL (ref 3.5–5.0)
Alkaline Phosphatase: 91 U/L (ref 38–126)
Anion gap: 7 (ref 5–15)
BUN: 12 mg/dL (ref 6–20)
CO2: 28 mmol/L (ref 22–32)
Calcium: 9 mg/dL (ref 8.9–10.3)
Chloride: 100 mmol/L (ref 98–111)
Creatinine: 0.65 mg/dL (ref 0.44–1.00)
GFR, Estimated: >60 mL/min (ref >60)
Glucose, Bld: 201 mg/dL — ABNORMAL HIGH (ref 70–99)
Potassium: 3.8 mmol/L (ref 3.5–5.1)
Sodium: 135 mmol/L (ref 135–145)
Total Bilirubin: 0.3 mg/dL (ref 0.3–1.2)
Total Protein: 6.9 g/dL (ref 6.5–8.1)

## 2022-08-08 LAB — CBC WITH DIFFERENTIAL (CANCER CENTER ONLY)
Abs Immature Granulocytes: 0.02 10*3/uL (ref 0.00–0.07)
Basophils Absolute: 0.1 10*3/uL (ref 0.0–0.1)
Basophils Relative: 3 %
Eosinophils Absolute: 0 10*3/uL (ref 0.0–0.5)
Eosinophils Relative: 1 %
HCT: 31.4 % — ABNORMAL LOW (ref 36.0–46.0)
Hemoglobin: 11 g/dL — ABNORMAL LOW (ref 12.0–15.0)
Immature Granulocytes: 1 %
Lymphocytes Relative: 21 %
Lymphs Abs: 0.6 10*3/uL — ABNORMAL LOW (ref 0.7–4.0)
MCH: 27 pg (ref 26.0–34.0)
MCHC: 35 g/dL (ref 30.0–36.0)
MCV: 77.1 fL — ABNORMAL LOW (ref 80.0–100.0)
Monocytes Absolute: 0.5 10*3/uL (ref 0.1–1.0)
Monocytes Relative: 20 %
Neutro Abs: 1.5 10*3/uL — ABNORMAL LOW (ref 1.7–7.7)
Neutrophils Relative %: 54 %
Platelet Count: 199 10*3/uL (ref 150–400)
RBC: 4.07 MIL/uL (ref 3.87–5.11)
RDW: 15.2 % (ref 11.5–15.5)
Smear Review: NORMAL
WBC Count: 2.7 10*3/uL — ABNORMAL LOW (ref 4.0–10.5)
nRBC: 0 % (ref 0.0–0.2)

## 2022-08-08 MED ORDER — SODIUM CHLORIDE 0.9% FLUSH
10.0000 mL | INTRAVENOUS | Status: DC | PRN
  Administered 2022-08-08: 10 mL

## 2022-08-08 MED ORDER — SODIUM CHLORIDE 0.9 % IV SOLN: Freq: Once | INTRAVENOUS | Status: AC

## 2022-08-08 MED ORDER — LIDOCAINE HCL 2 % EX GEL: 1.0000 | CUTANEOUS | 0 refills | Status: DC | PRN

## 2022-08-08 MED ORDER — HEPARIN SOD (PORK) LOCK FLUSH 100 UNIT/ML IV SOLN
500.0000 [IU] | Freq: Once | INTRAVENOUS | Status: AC | PRN
  Administered 2022-08-08: 500 [IU]

## 2022-08-08 NOTE — Progress Notes (Signed)
Symptom Management Consult Note [facility]    Patient Care Team: Gerre Scull, NP as PCP - General (Internal Medicine) Gweneth Dimitri, MD as Consulting Physician (Family Medicine) Gweneth Dimitri, MD as Consulting Physician (Family Medicine) Serena Croissant, MD as Consulting Physician (Hematology and Oncology) Emelia Loron, MD as Consulting Physician (General Surgery) Dorothy Puffer, MD as Consulting Physician (Radiation Oncology) Donnelly Angelica, RN as Oncology Nurse Navigator Pershing Proud, RN as Oncology Nurse Navigator    Name / MRN / DOB: Lynn [name]  [id number]  [birth year]   Date of visit: 08/08/2022   Chief Complaint/Reason for visit: congestion and sore throat, concern for hemorrhoid   Current Therapy: Adriamycin and cytoxan with Udenyca   Last treatment:  Day 1   Cycle 4 on 07/31/22 with G-CSF on 08/02/22   ASSESSMENT & PLAN: Patient is a 37 y.o. 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 Pamelia Hoit.  I have viewed most recent oncology note and lab work.    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 - WBC 2.7, HgB 11.0, ANC 1.5. - Discussed neutropenic precautions. No indications for blood transfusion at this time.  #Hemorrhoid - Exam without external hemorrhoid.  -Discussed OTC symptom management. Prescription sent for lidocaine jelly for external application. -Patient will continue Miralax and stool softener.   #Congestion -Patient non toxic appearing. Afebrile, HDS. Clear lung exam. No sinus tenderness. -No signs of mucositis or strep on exam. Patient encouraged to try magic mouth to help with sore throat. She already has some at home. - Symptoms suggestive of viral URI, had sick contact. - Patient received 1L NS for  hydration support as she has had decreased PO intake since symptoms started 2/2 to feeling poorly. - Discussed OTC symptom management. - Patient knows to RTC if symptoms do not improve.  Strict ED precautions discussed should symptoms worsen.      Heme/Onc History: Oncology History  Malignant neoplasm of overlapping sites of left breast in female, estrogen receptor positive (HCC)  05/29/2022 Initial Diagnosis   Palpable abnormalities in the left breast led to mammograms which revealed 3 masses 0.7 cm at 1 o'clock position, 3 cm at 2 o'clock position, additional nodule 8 mm total span 5 cm, 5 abnormal axillary lymph nodes biopsy of the masses and the lymph node were positive for grade 3 IDC with high-grade DCIS with LVI, ER 95%, PR 40%, Ki-67 60%, HER2 2+ by IHC FISH negative   06/15/2022 Cancer Staging   Staging form: Breast, AJCC 8th Edition - Clinical: Stage IIIB (cT3, cN3, cM0, G3, ER+, PR+, HER2-) - Signed by Loa Socks, NP on 06/15/2022 Stage prefix: Initial diagnosis Histologic grading system: 3 grade system   06/15/2022 Breast MRI   IMPRESSION: 1. Multiple sites of biopsy proven malignancy with in the lateral left breast. The overall conglomerate of suspicious findings spans approximately 4.4 x 3.5 x 3.5 cm, and contains all 3 post biopsy marking clips. 2. 10+ morphologically abnormal level I, II, III and Rotter's lymph nodes within the left axilla. 3. Diffuse skin and trabecular thickening on the left, consistent with lymphovascular spread of disease. 4. No MRI evidence of malignancy on the right.   06/20/2022 -  Chemotherapy   Patient is on Treatment Plan : BREAST ADJUVANT DOSE DENSE AC q14d / PACLitaxel q7d  Genetic Testing   [facility] Multi-Cancer Panel+RNA was Negative. Report date is 06/13/2022.  The Multi-Cancer + RNA Panel offered by [facility] includes sequencing and/or deletion/duplication analysis of the following 70 genes:  AIP*, ALK, APC*, ATM*,  AXIN2*, BAP1*, BARD1*, BLM*, BMPR1A*, BRCA1*, BRCA2*, BRIP1*, CDC73*, CDH1*, CDK4, CDKN1B*, CDKN2A, CHEK2*, CTNNA1*, DICER1*, EPCAM (del/dup only), EGFR, FH*, FLCN*, GREM1 (promoter dup only), HOXB13, KIT, LZTR1, MAX*, MBD4, MEN1*, MET, MITF, MLH1*, MSH2*, MSH3*, MSH6*, MUTYH*, NF1*, NF2*, NTHL1*, PALB2*, PDGFRA, PMS2*, POLD1*, POLE*, POT1*, PRKAR1A*, PTCH1*, PTEN*, RAD51C*, RAD51D*, RB1*, RET, SDHA* (sequencing only), SDHAF2*, SDHB*, SDHC*, SDHD*, SMAD4*, SMARCA4*, SMARCB1*, SMARCE1*, STK11*, SUFU*, TMEM127*, TP53*, TSC1*, TSC2*, VHL*. RNA analysis is performed for * genes.       Interval history-: Lynn [name] is a 38 y.o. female with oncologic history as above presenting to [facility] today with chief complaint of sore throat, congestion, and concern for hemorrhoid. Patient presents unaccompanied to clinic.  Patient reports her symptoms started x 3 days ago. Her daughter had similar symptoms. She took a covid test prior to today's appointment and it was negative. She states she first had a runny nose however now has nasal congestion. She has not had any fevers. Her throat feels sore, scratchy sensation. Worse when eating or drinking. She has Magic Mouthwash at home from previous visit although has not yet needed to take it as the sores on her lip quickly resolved. She has not been drinking as much water as she typically does because she has been feeling poorly. She denies any sinus pressure, ear pain, cough, shortness of breath.  Patient also is concerned she has a hemorrhoid. She was constipated last week and had hard stool that might have caused it she thinks. She is currently taking miralax and stool softener and reports stool is loose yet still formed. She has throbbing pain with bowel movements or if seated for a long time. She has not history of hemorrhoids. She did notice bright red blood on the toilet paper after wiping a few days ago.       ROS  All other systems are reviewed and are negative  for acute change except as noted in the HPI.    Allergies  Allergen Reactions   Macrobid [Nitrofurantoin Macrocrystal] Shortness Of Breath and Nausea And Vomiting   Ibuprofen Hives     Past Medical History:  Diagnosis Date   Cancer (HCC) 2024   L breast   Carpal tunnel syndrome, left    Diabetes (HCC)    Hypertension    Iron deficiency    Panic attacks    PCOS (polycystic ovarian syndrome)    Tachycardia      Past Surgical History:  Procedure Laterality Date   ADENOIDECTOMY     BREAST BIOPSY Left 05/29/2022   [place] LT BREAST BX W LOC DEV 1ST LESION IMG BX SPEC US GUIDE 05/29/2022 [facility]   BREAST BIOPSY Left 05/29/2022   [place] LT BREAST BX W LOC DEV EA ADD LESION IMG BX SPEC US GUIDE 05/29/2022 [facility]   BREAST BIOPSY Left 06/06/2022   MM LT BREAST BX W LOC DEV 1ST LESION IMAGE BX SPEC STEREO GUIDE 06/06/2022 [facility]   CESAREAN SECTION     PORTACATH PLACEMENT N/A 06/19/2022   Procedure: INSERTION PORT-A-CATH;  Surgeon: Emelia Loron, MD;  Location: [facility] OR;  Service: General;  Laterality: N/A;   TONSILLECTOMY      Social History   Socioeconomic History   Marital status: Significant Other    Spouse  name: Not on file   Number of children: 1   Years of education: some college   Highest education level: Not on file  Occupational History   Not on file  Tobacco Use   Smoking status: Former    Packs/day: 1.00    Years: 7.00    Additional pack years: 0.00    Total pack years: 7.00    Types: Cigarettes, E-cigarettes    Quit date: 10/29/2021    Years since quitting: 0.7   Smokeless tobacco: Never  Vaping Use   Vaping Use: Former  Substance and Sexual Activity   Alcohol use: Yes    Comment: rarely   Drug use: No   Sexual activity: Yes    Birth control/protection: Condom  Other Topics Concern   Not on file  Social History Narrative      Social Determinants of Health   Financial Resource Strain: Not on file  Food Insecurity: Not on  file  Transportation Needs: Not on file  Physical Activity: Not on file  Stress: Not on file  Social Connections: Not on file  Intimate Partner Violence: Not on file    Family History  Problem Relation Age of Onset   Heart disease Mother    Diabetes Mother    Hypertension Mother    Kidney disease Mother    Heart failure Mother    Heart attack Mother 33   Arthritis Mother    Asthma Mother    COPD Mother    Depression Mother    Hyperlipidemia Mother    Mental illness Mother    Bladder Cancer Mother    Heart disease Father    Heart attack Father 50   COPD Father    Hyperlipidemia Father    Hypertension Father    Mental illness Father    Arthritis Sister    Depression Sister    Diabetes Sister    Hypertension Sister    Mental illness Sister    Miscarriages / Stillbirths Sister    Alcohol abuse Sister    Depression Sister    Diabetes Sister    Heart disease Sister    Heart attack Sister 69   Arthritis Sister    Kidney disease Sister    Transient ischemic attack Sister    Bladder Cancer Maternal Aunt    Colon cancer Maternal Aunt 9 - 37   Other Maternal Aunt 32 - 59       brain tumor (unknown if benign or malignant)   Lung cancer Paternal Aunt 55   Melanoma Paternal Aunt 66 - 49   Heart failure Maternal Grandmother    Arthritis Maternal Grandmother    Alcohol abuse Maternal Grandmother    COPD Maternal Grandmother    Diabetes Maternal Grandmother    Hearing loss Maternal Grandmother    Heart disease Maternal Grandmother    Hyperlipidemia Maternal Grandmother    Hypertension Maternal Grandmother    Stroke Maternal Grandmother    Miscarriages / Stillbirths Maternal Grandmother    Mental illness Maternal Grandmother    Heart attack Maternal Grandmother    Heart failure Maternal Grandfather    Alcohol abuse Maternal Grandfather    Arthritis Maternal Grandfather    Early death Maternal Grandfather    Heart disease Maternal Grandfather    Hyperlipidemia  Maternal Grandfather    Hypertension Maternal Grandfather    Heart attack Maternal Grandfather 68   Mental illness Maternal Grandfather    Heart failure Paternal Grandmother    Arthritis Paternal Grandmother  Diabetes Paternal Grandmother    Heart attack Paternal Grandmother    Miscarriages / Stillbirths Paternal Grandmother    Mental illness Paternal Grandmother    Kidney disease Paternal Grandmother    Intellectual disability Paternal Grandmother    Hypertension Paternal Grandmother    Hyperlipidemia Paternal Grandmother    Heart disease Paternal Grandmother    Heart failure Paternal Grandfather    Arthritis Paternal Grandfather    Heart attack Paternal Grandfather    Mental illness Paternal Grandfather    Hearing loss Paternal Grandfather    Heart disease Paternal Grandfather    Hyperlipidemia Paternal Grandfather    Hypertension Paternal Grandfather      Current Outpatient Medications:    lidocaine (XYLOCAINE) 2 % jelly, Apply 1 Application topically as needed., Disp: 85 g, Rfl: 0   acetaminophen (TYLENOL) 500 MG tablet, Take 2 tablets (1,000 mg total) by mouth every 6 (six) hours as needed for moderate pain or headache., Disp: 30 tablet, Rfl: 0   ALPRAZolam (XANAX) 0.5 MG tablet, Take 1 tablet (0.5 mg total) by mouth at bedtime as needed for anxiety., Disp: 30 tablet, Rfl: 1   aspirin EC 325 MG tablet, Take 325 mg by mouth in the morning. (Patient not taking: Reported on 06/27/2022), Disp: , Rfl:    aspirin-acetaminophen-caffeine (EXCEDRIN MIGRAINE) 250-250-65 MG tablet, Take 1-2 tablets by mouth 2 (two) times daily as needed for headache or migraine. (Patient not taking: Reported on 06/18/2022), Disp: , Rfl:    Blood Glucose Monitoring Suppl (ACCU-CHEK AVIVA PLUS) w/Device KIT, 1 each by Does not apply route daily at 6 (six) AM. (Patient not taking: Reported on 07/03/2022), Disp: 1 kit, Rfl: 0   carvedilol (COREG) 25 MG tablet, Take 1 tablet (25 mg total) by mouth 2 (two) times  daily with a meal., Disp: 180 tablet, Rfl: 1   cyclobenzaprine (FLEXERIL) 10 MG tablet, Take 1 tablet (10 mg total) by mouth 3 (three) times daily as needed for muscle spasms. (Patient not taking: Reported on 06/27/2022), Disp: 30 tablet, Rfl: 1   dexamethasone (DECADRON) 4 MG tablet, Take 1 tab on day 2 and 1 tab on day 3 Take with food., Disp: 12 tablet, Rfl: 0   famotidine (PEPCID) 20 MG tablet, TAKE 1 TABLET(20 MG) BY MOUTH TWICE DAILY, Disp: 180 tablet, Rfl: 0   glucose blood (ACCU-CHEK AVIVA PLUS) test strip, Use as instructed, Disp: 100 each, Rfl: 12   lidocaine-prilocaine (EMLA) cream, Apply to affected area once, Disp: 30 g, Rfl: 3   magic mouthwash (lidocaine, diphenhydrAMINE, alum & mag hydroxide) suspension, Take 5 mLs by mouth 3 (three) times daily as needed for mouth pain., Disp: 120 mL, Rfl: 0   ondansetron (ZOFRAN) 8 MG tablet, Take 1 tab (8 mg) by mouth every 8 hrs as needed for nausea/vomiting. Start third day after doxorubicin/cyclophosphamide chemotherapy., Disp: 30 tablet, Rfl: 1   ONE TOUCH CLUB LANCETS MISC, 1 each by Does not apply route daily., Disp: 100 each, Rfl: 11   prochlorperazine (COMPAZINE) 10 MG tablet, Take 1 tablet (10 mg total) by mouth every 6 (six) hours as needed for nausea or vomiting., Disp: 30 tablet, Rfl: 1   Semaglutide,0.25 or 0.5MG /DOS, (OZEMPIC, 0.25 OR 0.5 MG/DOSE,) 2 MG/1.5ML SOPN, Inject 0.25 mg into the skin once a week. Start with 0.25MG  once a week x 4 weeks, then increase to 0.5MG  weekly., Disp: 1.5 mL, Rfl: 3   sertraline (ZOLOFT) 25 MG tablet, Take 1 tablet (25 mg total) by mouth daily. (Patient not  taking: Reported on 06/18/2022), Disp: 30 tablet, Rfl: 1  Current Facility-Administered Medications:    sodium chloride flush (NS) 0.9 % injection 10 mL, 10 mL, Intracatheter, PRN, Serena Croissant, MD, 10 mL at 08/08/22 1601  PHYSICAL EXAM: ECOG FS:1 - Symptomatic but completely ambulatory    Vitals:   08/08/22 1359 08/08/22 1558  BP: 129/81  127/85  Pulse: 93 89  Resp: 20 18  Temp: 97.8 F (36.6 C)   TempSrc: Oral   SpO2: 97% 100%  Weight: (!) 317 lb 11.2 oz (144.1 kg)    Physical Exam Vitals and nursing note reviewed. Exam conducted with a chaperone present.  Constitutional:      Appearance: She is not ill-appearing or toxic-appearing.  HENT:     Head: Normocephalic.     Right Ear: Tympanic membrane and external ear normal.     Left Ear: Tympanic membrane and external ear normal.     Nose: Congestion present.     Mouth/Throat:     Mouth: Mucous membranes are dry.     Pharynx: No oropharyngeal exudate or posterior oropharyngeal erythema.     Comments: No erythema to oropharynx, no edema, no exudate, no tonsillar swelling, voice normal, neck supple without lymphadenopathy   Eyes:     Conjunctiva/sclera: Conjunctivae normal.  Cardiovascular:     Rate and Rhythm: Normal rate and regular rhythm.     Pulses: Normal pulses.     Heart sounds: Normal heart sounds.  Pulmonary:     Effort: Pulmonary effort is normal. No respiratory distress.     Breath sounds: Normal breath sounds. No stridor. No wheezing or rales.  Abdominal:     General: There is no distension.  Genitourinary:    Comments: No external hemorrhoids. Anus normal appearing Chaperone Farley Ly present for exam Musculoskeletal:     Cervical back: Normal range of motion.  Skin:    General: Skin is warm and dry.  Neurological:     Mental Status: She is alert.        LABORATORY DATA: I have reviewed the data as listed    Latest Ref Rng & Units 08/08/2022    2:43 PM 07/31/2022    8:15 AM 07/17/2022    8:12 AM  CBC  WBC 4.0 - 10.5 K/uL 2.7  6.4  4.9   Hemoglobin 12.0 - 15.0 g/dL 16.1  09.6  04.5   Hematocrit 36.0 - 46.0 % 31.4  31.7  34.4   Platelets 150 - 400 K/uL 199  140  177         Latest Ref Rng & Units 08/08/2022    2:43 PM 07/31/2022    8:15 AM 07/17/2022    8:12 AM  CMP  Glucose 70 - 99 mg/dL 409  811  914   BUN 6 - 20 mg/dL 12  11   10    Creatinine 0.44 - 1.00 mg/dL 7.82  9.56  2.13   Sodium 135 - 145 mmol/L 135  135  136   Potassium 3.5 - 5.1 mmol/L 3.8  3.9  4.0   Chloride 98 - 111 mmol/L 100  103  103   CO2 22 - 32 mmol/L 28  25  27    Calcium 8.9 - 10.3 mg/dL 9.0  8.8  8.8   Total Protein 6.5 - 8.1 g/dL 6.9  6.4  6.6   Total Bilirubin 0.3 - 1.2 mg/dL 0.3  0.3  0.4   Alkaline Phos 38 - 126 U/L 91  66  63   AST 15 - 41 U/L 12  14  19    ALT 0 - 44 U/L 14  17  22         RADIOGRAPHIC STUDIES (from last 24 hours if applicable) I have personally reviewed the radiological images as listed and agreed with the findings in the report. No results found.      Visit Diagnosis: 1. Nasal congestion   2. Port-A-Cath in place   3. Malignant neoplasm of overlapping sites of left breast in female, estrogen receptor positive (HCC)   4. Other pancytopenia (HCC)      Orders Placed This Encounter  Procedures   CBC with Differential (Cancer Center Only)    Standing Status:   Future    Number of Occurrences:   1    Standing Expiration Date:   08/08/2023   CMP (Cancer Center only)    Standing Status:   Future    Number of Occurrences:   1    Standing Expiration Date:   08/08/2023    All questions were answered. The patient knows to call the clinic with any problems, questions or concerns. No barriers to learning was detected.  A total of more than 30 minutes were spent on this encounter with face-to-face time and non-face-to-face time, including preparing to see the patient, ordering tests and/or medications, counseling the patient and coordination of care as outlined above.    Thank you for allowing me to participate in the care of this patient.    Shanon Ace, PA-C Department of Hematology/Oncology [facility] at [facility] Phone: [phone]  [phone]    08/08/2022 8:43 PM

## 2022-08-08 NOTE — Patient Instructions (Addendum)
Hemorrhoids  The mainstay of treatment and prevention of hemorrhoids is taking steps to assure regular, soft bowel movements.  Hydration: It is recommended that you drink at least 64 oz of water a day to stay well-hydrated.   Symptomatic treatments  Lidocaine jelly:  Apply to the outside of your rectum to help with pain.  Which Hazel: Apply as needed up to 6 times per day or after each bowel movement.  This can be found as a liquid or in products such as Tucks or Preparation H pads.  Stool softeners: May use stool softeners, such as docusate (generic for Colace), to improve comfort with bowel movements.

## 2022-08-08 NOTE — Telephone Encounter (Signed)
Patient called [facility] requesting to be seen for new onset congestion and sore throat. Per patient, she thought symptoms were allergies yesterday and woke up this morning feeling worse. Denies fever, sick exposures, nausea, vomiting or diarrhea. Covid negative. Appointment made for 2pm today, patient aware of date, time and location of appt.

## 2022-08-14 MED FILL — Dexamethasone Sodium Phosphate Inj 100 MG/10ML: INTRAMUSCULAR | Qty: 1 | Status: AC

## 2022-08-15 ENCOUNTER — Other Ambulatory Visit (HOSPITAL_COMMUNITY): Payer: Self-pay

## 2022-08-15 ENCOUNTER — Inpatient Hospital Stay: Payer: Managed Care, Other (non HMO)

## 2022-08-15 ENCOUNTER — Encounter: Payer: Self-pay | Admitting: Hematology and Oncology

## 2022-08-15 ENCOUNTER — Inpatient Hospital Stay (HOSPITAL_BASED_OUTPATIENT_CLINIC_OR_DEPARTMENT_OTHER): Payer: Managed Care, Other (non HMO) | Admitting: Adult Health

## 2022-08-15 ENCOUNTER — Encounter: Payer: Self-pay | Admitting: Adult Health

## 2022-08-15 VITALS — BP 149/90 | HR 98 | Temp 98.2°F | Resp 16

## 2022-08-15 VITALS — BP 121/68 | HR 95 | Temp 97.5°F | Wt 323.1 lb

## 2022-08-15 DIAGNOSIS — E119 Type 2 diabetes mellitus without complications: Secondary | ICD-10-CM

## 2022-08-15 DIAGNOSIS — Z170 Estrogen receptor positive status [ER+]: Secondary | ICD-10-CM

## 2022-08-15 DIAGNOSIS — C50812 Malignant neoplasm of overlapping sites of left female breast: Secondary | ICD-10-CM

## 2022-08-15 DIAGNOSIS — Z95828 Presence of other vascular implants and grafts: Secondary | ICD-10-CM

## 2022-08-15 LAB — CBC WITH DIFFERENTIAL (CANCER CENTER ONLY)
Abs Immature Granulocytes: 0.21 10*3/uL — ABNORMAL HIGH (ref 0.00–0.07)
Basophils Absolute: 0.1 10*3/uL (ref 0.0–0.1)
Basophils Relative: 2 %
Eosinophils Absolute: 0 10*3/uL (ref 0.0–0.5)
Eosinophils Relative: 0 %
HCT: 33.4 % — ABNORMAL LOW (ref 36.0–46.0)
Hemoglobin: 11.4 g/dL — ABNORMAL LOW (ref 12.0–15.0)
Immature Granulocytes: 3 %
Lymphocytes Relative: 13 %
Lymphs Abs: 0.8 10*3/uL (ref 0.7–4.0)
MCH: 26.8 pg (ref 26.0–34.0)
MCHC: 34.1 g/dL (ref 30.0–36.0)
MCV: 78.6 fL — ABNORMAL LOW (ref 80.0–100.0)
Monocytes Absolute: 1.1 10*3/uL — ABNORMAL HIGH (ref 0.1–1.0)
Monocytes Relative: 18 %
Neutro Abs: 3.9 10*3/uL (ref 1.7–7.7)
Neutrophils Relative %: 64 %
Platelet Count: 180 10*3/uL (ref 150–400)
RBC: 4.25 MIL/uL (ref 3.87–5.11)
RDW: 16.9 % — ABNORMAL HIGH (ref 11.5–15.5)
WBC Count: 6.1 10*3/uL (ref 4.0–10.5)
nRBC: 0 % (ref 0.0–0.2)

## 2022-08-15 LAB — CMP (CANCER CENTER ONLY)
ALT: 17 U/L (ref 0–44)
AST: 15 U/L (ref 15–41)
Albumin: 4 g/dL (ref 3.5–5.0)
Alkaline Phosphatase: 67 U/L (ref 38–126)
Anion gap: 8 (ref 5–15)
BUN: 14 mg/dL (ref 6–20)
CO2: 27 mmol/L (ref 22–32)
Calcium: 8.9 mg/dL (ref 8.9–10.3)
Chloride: 99 mmol/L (ref 98–111)
Creatinine: 0.68 mg/dL (ref 0.44–1.00)
GFR, Estimated: >60 mL/min (ref >60)
Glucose, Bld: 238 mg/dL — ABNORMAL HIGH (ref 70–99)
Potassium: 4.2 mmol/L (ref 3.5–5.1)
Sodium: 134 mmol/L — ABNORMAL LOW (ref 135–145)
Total Bilirubin: 0.5 mg/dL (ref 0.3–1.2)
Total Protein: 6.9 g/dL (ref 6.5–8.1)

## 2022-08-15 MED ORDER — FAMOTIDINE 20 MG IN NS 100 ML IVPB
20.0000 mg | Freq: Once | INTRAVENOUS | Status: AC
  Administered 2022-08-15: 20 mg via INTRAVENOUS
  Filled 2022-08-15: qty 100

## 2022-08-15 MED ORDER — HEPARIN SOD (PORK) LOCK FLUSH 100 UNIT/ML IV SOLN
500.0000 [IU] | Freq: Once | INTRAVENOUS | Status: AC | PRN
  Administered 2022-08-15: 500 [IU]

## 2022-08-15 MED ORDER — SODIUM CHLORIDE 0.9 % IV SOLN
10.0000 mg | Freq: Once | INTRAVENOUS | Status: AC
  Administered 2022-08-15: 10 mg via INTRAVENOUS
  Filled 2022-08-15: qty 10

## 2022-08-15 MED ORDER — OZEMPIC (0.25 OR 0.5 MG/DOSE) 2 MG/3ML ~~LOC~~ SOPN
0.2500 mg | PEN_INJECTOR | SUBCUTANEOUS | 3 refills | Status: DC
  Filled 2022-08-15: qty 3, 56d supply, fill #0

## 2022-08-15 MED ORDER — DIPHENHYDRAMINE HCL 50 MG/ML IJ SOLN
50.0000 mg | Freq: Once | INTRAMUSCULAR | Status: AC
  Administered 2022-08-15: 50 mg via INTRAVENOUS
  Filled 2022-08-15: qty 1

## 2022-08-15 MED ORDER — SODIUM CHLORIDE 0.9% FLUSH
10.0000 mL | INTRAVENOUS | Status: DC | PRN
  Administered 2022-08-15: 10 mL

## 2022-08-15 MED ORDER — SODIUM CHLORIDE 0.9 % IV SOLN
80.0000 mg/m2 | Freq: Once | INTRAVENOUS | Status: AC
  Administered 2022-08-15: 210 mg via INTRAVENOUS
  Filled 2022-08-15: qty 35

## 2022-08-15 MED ORDER — SODIUM CHLORIDE 0.9 % IV SOLN: Freq: Once | INTRAVENOUS | Status: AC

## 2022-08-15 NOTE — Progress Notes (Signed)
[facility] Cancer Follow up:    Gerre Scull, NP [address] [place] [address]   DIAGNOSIS:  Cancer Staging  Malignant neoplasm of overlapping sites of left breast in female, estrogen receptor positive (HCC) Staging form: Breast, AJCC 8th Edition - Clinical stage from 06/04/2022: Stage IIB (cT2, cN1(f), cM0, G3, ER+, PR+, HER2-) - Signed by Ronny Bacon, PA-C on 06/04/2022 Stage prefix: Initial diagnosis Method of lymph node assessment: Core biopsy Histologic grading system: 3 grade system - Clinical: Stage IIIB (cT3, cN3, cM0, G3, ER+, PR+, HER2-) - Signed by Loa Socks, NP on 06/15/2022 Stage prefix: Initial diagnosis Histologic grading system: 3 grade system   SUMMARY OF ONCOLOGIC HISTORY: Oncology History  Malignant neoplasm of overlapping sites of left breast in female, estrogen receptor positive (HCC)  05/29/2022 Initial Diagnosis   Palpable abnormalities in the left breast led to mammograms which revealed 3 masses 0.7 cm at 1 o'clock position, 3 cm at 2 o'clock position, additional nodule 8 mm total span 5 cm, 5 abnormal axillary lymph nodes biopsy of the masses and the lymph node were positive for grade 3 IDC with high-grade DCIS with LVI, ER 95%, PR 40%, Ki-67 60%, HER2 2+ by IHC FISH negative   06/15/2022 Cancer Staging   Staging form: Breast, AJCC 8th Edition - Clinical: Stage IIIB (cT3, cN3, cM0, G3, ER+, PR+, HER2-) - Signed by Loa Socks, NP on 06/15/2022 Stage prefix: Initial diagnosis Histologic grading system: 3 grade system   06/15/2022 Breast MRI   IMPRESSION: 1. Multiple sites of biopsy proven malignancy with in the lateral left breast. The overall conglomerate of suspicious findings spans approximately 4.4 x 3.5 x 3.5 cm, and contains all 3 post biopsy marking clips. 2. 10+ morphologically abnormal level I, II, III and Rotter's lymph nodes within the left axilla. 3. Diffuse skin and trabecular  thickening on the left, consistent with lymphovascular spread of disease. 4. No MRI evidence of malignancy on the right.   06/20/2022 -  Chemotherapy   Patient is on Treatment Plan : BREAST ADJUVANT DOSE DENSE AC q14d / PACLitaxel q7d      Genetic Testing   [facility] Multi-Cancer Panel+RNA was Negative. Report date is 06/13/2022.  The Multi-Cancer + RNA Panel offered by [facility] includes sequencing and/or deletion/duplication analysis of the following 70 genes:  AIP*, ALK, APC*, ATM*, AXIN2*, BAP1*, BARD1*, BLM*, BMPR1A*, BRCA1*, BRCA2*, BRIP1*, CDC73*, CDH1*, CDK4, CDKN1B*, CDKN2A, CHEK2*, CTNNA1*, DICER1*, EPCAM (del/dup only), EGFR, FH*, FLCN*, GREM1 (promoter dup only), HOXB13, KIT, LZTR1, MAX*, MBD4, MEN1*, MET, MITF, MLH1*, MSH2*, MSH3*, MSH6*, MUTYH*, NF1*, NF2*, NTHL1*, PALB2*, PDGFRA, PMS2*, POLD1*, POLE*, POT1*, PRKAR1A*, PTCH1*, PTEN*, RAD51C*, RAD51D*, RB1*, RET, SDHA* (sequencing only), SDHAF2*, SDHB*, SDHC*, SDHD*, SMAD4*, SMARCA4*, SMARCB1*, SMARCE1*, STK11*, SUFU*, TMEM127*, TP53*, TSC1*, TSC2*, VHL*. RNA analysis is performed for * genes.     CURRENT THERAPY: Weekly Taxol  INTERVAL HISTORY: Lynn [name] 37 y.o. female returns for follow-up and evaluation prior to receiving her first weekly paclitaxel chemotherapy.  She is to receive 12 weekly cycles of this neoadjuvantly.  She tells me that in her left breast she notices that the skin change has improved tremendously.  She also feels like her left axillary lymphadenopathy has improved.  She endorses increased fatigue and is managing this with energy conservation.  Otherwise she is feeling moderately well today.  Blood sugar is 268 today.  She tells me that she was unable to get her Ozempic refilled for her type 2  diabetes because [facility] pharmacy did not have it in stock.   Patient Active Problem List   Diagnosis Date Noted   Port-A-Cath in place 07/31/2022   Genetic testing 06/15/2022   Anxiety and depression 06/11/2022    Malignant neoplasm of overlapping sites of left breast in female, estrogen receptor positive (HCC) 06/04/2022   Syncope and collapse 03/12/2022   Umbilical hernia without obstruction and without gangrene 03/12/2022   Vitamin D deficiency 03/12/2022   Cervical radicular pain 12/07/2021   Snoring 12/07/2021   Other fatigue 12/07/2021   Chest pain 08/15/2021   Epigastric pain 08/15/2021   Acute pain of both shoulders 04/21/2021   Nerve pain 04/21/2021   Morbid obesity (HCC) 10/13/2020   Essential hypertension 03/12/2019   Chronic tachycardia 03/12/2019   Generalized anxiety disorder with panic attacks 03/12/2019   Family history of heart attack 03/12/2019   LVH (left ventricular hypertrophy) due to hypertensive disease, without heart failure 03/12/2019   Type 2 diabetes mellitus (HCC) 03/12/2019    is allergic to macrobid [nitrofurantoin macrocrystal] and ibuprofen.  MEDICAL HISTORY: Past Medical History:  Diagnosis Date   Cancer (HCC) 2024   L breast   Carpal tunnel syndrome, left    Diabetes (HCC)    Hypertension    Iron deficiency    Panic attacks    PCOS (polycystic ovarian syndrome)    Tachycardia     SURGICAL HISTORY: Past Surgical History:  Procedure Laterality Date   ADENOIDECTOMY     BREAST BIOPSY Left 05/29/2022   [place] LT BREAST BX W LOC DEV 1ST LESION IMG BX SPEC US GUIDE 05/29/2022 [facility]   BREAST BIOPSY Left 05/29/2022   [place] LT BREAST BX W LOC DEV EA ADD LESION IMG BX SPEC US GUIDE 05/29/2022 [facility]   BREAST BIOPSY Left 06/06/2022   MM LT BREAST BX W LOC DEV 1ST LESION IMAGE BX SPEC STEREO GUIDE 06/06/2022 [facility]   CESAREAN SECTION     PORTACATH PLACEMENT N/A 06/19/2022   Procedure: INSERTION PORT-A-CATH;  Surgeon: Emelia Loron, MD;  Location: [facility] OR;  Service: General;  Laterality: N/A;   TONSILLECTOMY      SOCIAL HISTORY: Social History   Socioeconomic History   Marital status: Significant Other    Spouse name: Not on  file   Number of children: 1   Years of education: some college   Highest education level: Not on file  Occupational History   Not on file  Tobacco Use   Smoking status: Former    Packs/day: 1.00    Years: 7.00    Additional pack years: 0.00    Total pack years: 7.00    Types: Cigarettes, E-cigarettes    Quit date: 10/29/2021    Years since quitting: 0.7   Smokeless tobacco: Never  Vaping Use   Vaping Use: Former  Substance and Sexual Activity   Alcohol use: Yes    Comment: rarely   Drug use: No   Sexual activity: Yes    Birth control/protection: Condom  Other Topics Concern   Not on file  Social History Narrative      Social Determinants of Health   Financial Resource Strain: Not on file  Food Insecurity: Not on file  Transportation Needs: Not on file  Physical Activity: Not on file  Stress: Not on file  Social Connections: Not on file  Intimate Partner Violence: Not on file    FAMILY HISTORY: Family History  Problem Relation Age of Onset   Heart disease  Mother    Diabetes Mother    Hypertension Mother    Kidney disease Mother    Heart failure Mother    Heart attack Mother 32   Arthritis Mother    Asthma Mother    COPD Mother    Depression Mother    Hyperlipidemia Mother    Mental illness Mother    Bladder Cancer Mother    Heart disease Father    Heart attack Father 86   COPD Father    Hyperlipidemia Father    Hypertension Father    Mental illness Father    Arthritis Sister    Depression Sister    Diabetes Sister    Hypertension Sister    Mental illness Sister    Miscarriages / Stillbirths Sister    Alcohol abuse Sister    Depression Sister    Diabetes Sister    Heart disease Sister    Heart attack Sister 5   Arthritis Sister    Kidney disease Sister    Transient ischemic attack Sister    Bladder Cancer Maternal Aunt    Colon cancer Maternal Aunt 20 - 65   Other Maternal Aunt 71 - 59       brain tumor (unknown if benign or malignant)    Lung cancer Paternal Aunt 53   Melanoma Paternal Aunt 81 - 49   Heart failure Maternal Grandmother    Arthritis Maternal Grandmother    Alcohol abuse Maternal Grandmother    COPD Maternal Grandmother    Diabetes Maternal Grandmother    Hearing loss Maternal Grandmother    Heart disease Maternal Grandmother    Hyperlipidemia Maternal Grandmother    Hypertension Maternal Grandmother    Stroke Maternal Grandmother    Miscarriages / Stillbirths Maternal Grandmother    Mental illness Maternal Grandmother    Heart attack Maternal Grandmother    Heart failure Maternal Grandfather    Alcohol abuse Maternal Grandfather    Arthritis Maternal Grandfather    Early death Maternal Grandfather    Heart disease Maternal Grandfather    Hyperlipidemia Maternal Grandfather    Hypertension Maternal Grandfather    Heart attack Maternal Grandfather 60   Mental illness Maternal Grandfather    Heart failure Paternal Grandmother    Arthritis Paternal Grandmother    Diabetes Paternal Grandmother    Heart attack Paternal Grandmother    Miscarriages / Stillbirths Paternal Grandmother    Mental illness Paternal Grandmother    Kidney disease Paternal Grandmother    Intellectual disability Paternal Grandmother    Hypertension Paternal Grandmother    Hyperlipidemia Paternal Grandmother    Heart disease Paternal Grandmother    Heart failure Paternal Grandfather    Arthritis Paternal Grandfather    Heart attack Paternal Grandfather    Mental illness Paternal Grandfather    Hearing loss Paternal Grandfather    Heart disease Paternal Grandfather    Hyperlipidemia Paternal Grandfather    Hypertension Paternal Grandfather     Review of Systems  Constitutional:  Positive for fatigue. Negative for appetite change, chills, fever and unexpected weight change.  HENT:   Negative for hearing loss, lump/mass and trouble swallowing.   Eyes:  Negative for eye problems and icterus.  Respiratory:  Negative for chest  tightness, cough and shortness of breath.   Cardiovascular:  Negative for chest pain, leg swelling and palpitations.  Gastrointestinal:  Negative for abdominal distention, abdominal pain, constipation, diarrhea, nausea and vomiting.  Endocrine: Negative for hot flashes.  Genitourinary:  Negative for difficulty  urinating.   Musculoskeletal:  Negative for arthralgias.  Skin:  Negative for itching and rash.  Neurological:  Negative for dizziness, extremity weakness, headaches and numbness.  Hematological:  Negative for adenopathy. Does not bruise/bleed easily.  Psychiatric/Behavioral:  Negative for depression. The patient is not nervous/anxious.       PHYSICAL EXAMINATION   Onc Performance Status - 08/15/22 0853       ECOG Perf Status   ECOG Perf Status Restricted in physically strenuous activity but ambulatory and able to carry out work of a light or sedentary nature, e.g., light house work, office work      KPS SCALE   KPS % SCORE Able to carry on normal activity, minor s/s of disease             Vitals:   08/15/22 0845  BP: 121/68  Pulse: 95  Temp: (!) 97.5 F (36.4 C)  SpO2: 97%    Physical Exam Constitutional:      General: She is not in acute distress.    Appearance: Normal appearance. She is not toxic-appearing.  HENT:     Head: Normocephalic and atraumatic.  Eyes:     General: No scleral icterus. Cardiovascular:     Rate and Rhythm: Normal rate and regular rhythm.     Pulses: Normal pulses.     Heart sounds: Normal heart sounds.  Pulmonary:     Effort: Pulmonary effort is normal.     Breath sounds: Normal breath sounds.  Chest:     Comments: Difficulty in palpating any left breast mass or left axillary adenopathy Abdominal:     General: Abdomen is flat. Bowel sounds are normal. There is no distension.     Palpations: Abdomen is soft.     Tenderness: There is no abdominal tenderness.  Musculoskeletal:        General: No swelling.     Cervical back:  Neck supple.  Lymphadenopathy:     Cervical: No cervical adenopathy.  Skin:    General: Skin is warm and dry.     Findings: No rash.  Neurological:     General: No focal deficit present.     Mental Status: She is alert.  Psychiatric:        Mood and Affect: Mood normal.        Behavior: Behavior normal.     LABORATORY DATA:  CBC    Component Value Date/Time   WBC 6.1 08/15/2022 0819   WBC 8.0 02/14/2022 1027   RBC 4.25 08/15/2022 0819   HGB 11.4 (L) 08/15/2022 0819   HCT 33.4 (L) 08/15/2022 0819   PLT 180 08/15/2022 0819   MCV 78.6 (L) 08/15/2022 0819   MCH 26.8 08/15/2022 0819   MCHC 34.1 08/15/2022 0819   RDW 16.9 (H) 08/15/2022 0819   LYMPHSABS 0.8 08/15/2022 0819   MONOABS 1.1 (H) 08/15/2022 0819   EOSABS 0.0 08/15/2022 0819   BASOSABS 0.1 08/15/2022 0819    CMP     Component Value Date/Time   NA 134 (L) 08/15/2022 0819   K 4.2 08/15/2022 0819   CL 99 08/15/2022 0819   CO2 27 08/15/2022 0819   GLUCOSE 238 (H) 08/15/2022 0819   BUN 14 08/15/2022 0819   CREATININE 0.68 08/15/2022 0819   CALCIUM 8.9 08/15/2022 0819   PROT 6.9 08/15/2022 0819   ALBUMIN 4.0 08/15/2022 0819   AST 15 08/15/2022 0819   ALT 17 08/15/2022 0819   ALKPHOS 67 08/15/2022 0819   BILITOT 0.5 08/15/2022  7829   GFRNONAA >60 08/15/2022 0819   GFRAA >60 01/02/2020 2048        ASSESSMENT and THERAPY PLAN:   Malignant neoplasm of overlapping sites of left breast in female, estrogen receptor positive (HCC) Lynn [name] is a 37 year old woman with stage IIIB ER/PR positive breast cancer diagnosed in 06/2022 currently receiving neoadjuvant chemotherapy.    Treatment Plan:  1. Neoadjuvant chemotherapy with Adriamycin and Cytoxan dose dense 4 followed by Taxol weekly 12  2. Staging Scans with CT c/a/p and bone scan 3. Followed by breast conserving surgery with targeted axillary dissection 4. Followed by adjuvant radiation therapy 5.  Followed by antiestrogen  therapy  ____________________________________________________  Current treatment: Weekly Taxol, week 1  Chemo toxicities:  Fatigue: managing with energy conservation Treatment related anemia: mild, hemoglobin 11.4 Nausea: managed with compazine Mucositis: Magic Mouthwash  Hyperglycemia: She has been unable to get Ozempic refilled at [facility] due to supply issues.  This is caused her blood glucose to be difficult to manage.  I sent the Ozempic to [facility] outpatient pharmacy.  RTC weekly for labs, f/u, and chemotherapy.      All questions were answered. The patient knows to call the clinic with any problems, questions or concerns. We can certainly see the patient much sooner if necessary.  Total encounter time:30 minutes*in face-to-face visit time, chart review, lab review, care coordination, order entry, and documentation of the encounter time.    Lillard Anes, NP 08/15/22 10:13 AM Medical Oncology and Hematology [facility] [address] [place], [place] [address] Tel. [phone]    Fax. [phone]  *Total Encounter Time as defined by the Centers for Medicare and Medicaid Services includes, in addition to the face-to-face time of a patient visit (documented in the note above) non-face-to-face time: obtaining and reviewing outside history, ordering and reviewing medications, tests or procedures, care coordination (communications with other health care professionals or caregivers) and documentation in the medical record.

## 2022-08-15 NOTE — Assessment & Plan Note (Addendum)
Lynn [name] is a 37 year old woman with stage IIIB ER/PR positive breast cancer diagnosed in 06/2022 currently receiving neoadjuvant chemotherapy.    Treatment Plan:  1. Neoadjuvant chemotherapy with Adriamycin and Cytoxan dose dense 4 followed by Taxol weekly 12  2. Staging Scans with CT c/a/p and bone scan 3. Followed by breast conserving surgery with targeted axillary dissection 4. Followed by adjuvant radiation therapy 5.  Followed by antiestrogen therapy  ____________________________________________________  Current treatment: Weekly Taxol, week 1  Chemo toxicities:  Fatigue: managing with energy conservation Treatment related anemia: mild, hemoglobin 11.4 Nausea: managed with compazine Mucositis: Magic Mouthwash  Hyperglycemia: She has been unable to get Ozempic refilled at [facility] due to supply issues.  This is caused her blood glucose to be difficult to manage.  I sent the Ozempic to [facility] outpatient pharmacy.  RTC weekly for labs, f/u, and chemotherapy.

## 2022-08-15 NOTE — Patient Instructions (Signed)
[facility]   Discharge Instructions: Thank you for choosing [facility] to provide your oncology and hematology care.   If you have a lab appointment with the Cancer Center, please go directly to the Cancer Center and check in at the registration area.   Wear comfortable clothing and clothing appropriate for easy access to any Portacath or PICC line.   We strive to give you quality time with your provider. You may need to reschedule your appointment if you arrive late (15 or more minutes).  Arriving late affects you and other patients whose appointments are after yours.  Also, if you miss three or more appointments without notifying the office, you may be dismissed from the clinic at the provider's discretion.      For prescription refill requests, have your pharmacy contact our office and allow 72 hours for refills to be completed.    Today you received the following chemotherapy and/or immunotherapy agents: Paclitaxel (Taxol)      To help prevent nausea and vomiting after your treatment, we encourage you to take your nausea medication as directed.  BELOW ARE SYMPTOMS THAT SHOULD BE REPORTED IMMEDIATELY: *FEVER GREATER THAN 100.4 F (38 C) OR HIGHER *CHILLS OR SWEATING *NAUSEA AND VOMITING THAT IS NOT CONTROLLED WITH YOUR NAUSEA MEDICATION *UNUSUAL SHORTNESS OF BREATH *UNUSUAL BRUISING OR BLEEDING *URINARY PROBLEMS (pain or burning when urinating, or frequent urination) *BOWEL PROBLEMS (unusual diarrhea, constipation, pain near the anus) TENDERNESS IN MOUTH AND THROAT WITH OR WITHOUT PRESENCE OF ULCERS (sore throat, sores in mouth, or a toothache) UNUSUAL RASH, SWELLING OR PAIN  UNUSUAL VAGINAL DISCHARGE OR ITCHING   Items with * indicate a potential emergency and should be followed up as soon as possible or go to the Emergency Department if any problems should occur.  Please show the CHEMOTHERAPY ALERT CARD or IMMUNOTHERAPY ALERT  CARD at check-in to the Emergency Department and triage nurse.  Should you have questions after your visit or need to cancel or reschedule your appointment, please contact [facility]  Dept: [phone]  and follow the prompts.  Office hours are 8:00 a.m. to 4:30 p.m. Monday - Friday. Please note that voicemails left after 4:00 p.m. may not be returned until the following business day.  We are closed weekends and major holidays. You have access to a nurse at all times for urgent questions. Please call the main number to the clinic Dept: [phone] and follow the prompts.   For any non-urgent questions, you may also contact your provider using MyChart. We now offer e-Visits for anyone 18 and older to request care online for non-urgent symptoms. For details visit [email or web address].   Also download the MyChart app! Go to the app store, search "MyChart", open the app, select , and log in with your MyChart username and password.   

## 2022-08-16 ENCOUNTER — Telehealth: Payer: Self-pay

## 2022-08-16 ENCOUNTER — Other Ambulatory Visit: Payer: Self-pay

## 2022-08-16 NOTE — Telephone Encounter (Signed)
-----   Message from Caesar Chestnut, RN sent at 08/15/2022  3:01 PM EDT ----- Regarding: First Time Taxol - Dr. Pamelia Hoit Patient First Time Taxol - Dr. Pamelia Hoit Patient Patient tolerated treatment well.

## 2022-08-16 NOTE — Telephone Encounter (Signed)
LM for patient that this nurse was calling to see how they were doing after their treatment. Please call back to Dr. Gudena's nurse at [phone] if they have any questions or concerns regarding the treatment. 

## 2022-08-17 ENCOUNTER — Encounter (HOSPITAL_COMMUNITY): Payer: Self-pay | Admitting: General Surgery

## 2022-08-17 ENCOUNTER — Encounter: Payer: Self-pay | Admitting: *Deleted

## 2022-08-21 MED FILL — Dexamethasone Sodium Phosphate Inj 100 MG/10ML: INTRAMUSCULAR | Qty: 1 | Status: AC

## 2022-08-21 NOTE — Progress Notes (Signed)
Patient Care Team: Gerre Scull, NP as PCP - General (Internal Medicine) Gweneth Dimitri, MD as Consulting Physician (Family Medicine) Gweneth Dimitri, MD as Consulting Physician (Family Medicine) Serena Croissant, MD as Consulting Physician (Hematology and Oncology) Emelia Loron, MD as Consulting Physician (General Surgery) Dorothy Puffer, MD as Consulting Physician (Radiation Oncology) Donnelly Angelica, RN as Oncology Nurse Navigator Pershing Proud, RN as Oncology Nurse Navigator  DIAGNOSIS: No diagnosis found.  SUMMARY OF ONCOLOGIC HISTORY: Oncology History  Malignant neoplasm of overlapping sites of left breast in female, estrogen receptor positive (HCC)  05/29/2022 Initial Diagnosis   Palpable abnormalities in the left breast led to mammograms which revealed 3 masses 0.7 cm at 1 o'clock position, 3 cm at 2 o'clock position, additional nodule 8 mm total span 5 cm, 5 abnormal axillary lymph nodes biopsy of the masses and the lymph node were positive for grade 3 IDC with high-grade DCIS with LVI, ER 95%, PR 40%, Ki-67 60%, HER2 2+ by IHC FISH negative   06/15/2022 Cancer Staging   Staging form: Breast, AJCC 8th Edition - Clinical: Stage IIIB (cT3, cN3, cM0, G3, ER+, PR+, HER2-) - Signed by Loa Socks, NP on 06/15/2022 Stage prefix: Initial diagnosis Histologic grading system: 3 grade system   06/15/2022 Breast MRI   IMPRESSION: 1. Multiple sites of biopsy proven malignancy with in the lateral left breast. The overall conglomerate of suspicious findings spans approximately 4.4 x 3.5 x 3.5 cm, and contains all 3 post biopsy marking clips. 2. 10+ morphologically abnormal level I, II, III and Rotter's lymph nodes within the left axilla. 3. Diffuse skin and trabecular thickening on the left, consistent with lymphovascular spread of disease. 4. No MRI evidence of malignancy on the right.   06/20/2022 -  Chemotherapy   Patient is on Treatment Plan : BREAST ADJUVANT DOSE  DENSE AC q14d / PACLitaxel q7d      Genetic Testing   [facility] Multi-Cancer Panel+RNA was Negative. Report date is 06/13/2022.  The Multi-Cancer + RNA Panel offered by [facility] includes sequencing and/or deletion/duplication analysis of the following 70 genes:  AIP*, ALK, APC*, ATM*, AXIN2*, BAP1*, BARD1*, BLM*, BMPR1A*, BRCA1*, BRCA2*, BRIP1*, CDC73*, CDH1*, CDK4, CDKN1B*, CDKN2A, CHEK2*, CTNNA1*, DICER1*, EPCAM (del/dup only), EGFR, FH*, FLCN*, GREM1 (promoter dup only), HOXB13, KIT, LZTR1, MAX*, MBD4, MEN1*, MET, MITF, MLH1*, MSH2*, MSH3*, MSH6*, MUTYH*, NF1*, NF2*, NTHL1*, PALB2*, PDGFRA, PMS2*, POLD1*, POLE*, POT1*, PRKAR1A*, PTCH1*, PTEN*, RAD51C*, RAD51D*, RB1*, RET, SDHA* (sequencing only), SDHAF2*, SDHB*, SDHC*, SDHD*, SMAD4*, SMARCA4*, SMARCB1*, SMARCE1*, STK11*, SUFU*, TMEM127*, TP53*, TSC1*, TSC2*, VHL*. RNA analysis is performed for * genes.     CHIEF COMPLIANT: Cycle 4 AC  INTERVAL HISTORY: Lynn [name] is a 37 year old above-mentioned history of breast cancer is currently on chemotherapy with a dose dense Adriamycin and Cytoxan today is cycle 4.   ALLERGIES:  is allergic to macrobid [nitrofurantoin macrocrystal] and ibuprofen.  MEDICATIONS:  Current Outpatient Medications  Medication Sig Dispense Refill   acetaminophen (TYLENOL) 500 MG tablet Take 2 tablets (1,000 mg total) by mouth every 6 (six) hours as needed for moderate pain or headache. 30 tablet 0   ALPRAZolam (XANAX) 0.5 MG tablet Take 1 tablet (0.5 mg total) by mouth at bedtime as needed for anxiety. 30 tablet 1   aspirin EC 325 MG tablet Take 325 mg by mouth in the morning.     aspirin-acetaminophen-caffeine (EXCEDRIN MIGRAINE) 250-250-65 MG tablet Take 1-2 tablets by mouth 2 (two) times daily as needed for headache or migraine.  Blood Glucose Monitoring Suppl (ACCU-CHEK AVIVA PLUS) w/Device KIT 1 each by Does not apply route daily at 6 (six) AM. 1 kit 0   carvedilol (COREG) 25 MG tablet Take 1 tablet (25 mg total)  by mouth 2 (two) times daily with a meal. 180 tablet 1   cyclobenzaprine (FLEXERIL) 10 MG tablet Take 1 tablet (10 mg total) by mouth 3 (three) times daily as needed for muscle spasms. 30 tablet 1   famotidine (PEPCID) 20 MG tablet TAKE 1 TABLET(20 MG) BY MOUTH TWICE DAILY 180 tablet 0   glucose blood (ACCU-CHEK AVIVA PLUS) test strip Use as instructed 100 each 12   lidocaine (XYLOCAINE) 2 % jelly Apply 1 Application topically as needed. 85 g 0   lidocaine-prilocaine (EMLA) cream Apply to affected area once 30 g 3   magic mouthwash (lidocaine, diphenhydrAMINE, alum & mag hydroxide) suspension Take 5 mLs by mouth 3 (three) times daily as needed for mouth pain. 120 mL 0   ondansetron (ZOFRAN) 8 MG tablet Take 1 tab (8 mg) by mouth every 8 hrs as needed for nausea/vomiting. Start third day after doxorubicin/cyclophosphamide chemotherapy. 30 tablet 1   ONE TOUCH CLUB LANCETS MISC 1 each by Does not apply route daily. 100 each 11   prochlorperazine (COMPAZINE) 10 MG tablet Take 1 tablet (10 mg total) by mouth every 6 (six) hours as needed for nausea or vomiting. 30 tablet 1   Semaglutide,0.25 or 0.5MG /DOS, (OZEMPIC, 0.25 OR 0.5 MG/DOSE,) 2 MG/3ML SOPN Inject 0.25 mg into the skin once a week. 3 mL 3   sertraline (ZOLOFT) 25 MG tablet Take 1 tablet (25 mg total) by mouth daily. 30 tablet 1   No current facility-administered medications for this visit.    PHYSICAL EXAMINATION: ECOG PERFORMANCE STATUS: {CHL ONC ECOG [phone]}  There were no vitals filed for this visit. There were no vitals filed for this visit.  BREAST:*** No palpable masses or nodules in either right or left breasts. No palpable axillary supraclavicular or infraclavicular adenopathy no breast tenderness or nipple discharge. (exam performed in the presence of a chaperone)  LABORATORY DATA:  I have reviewed the data as listed    Latest Ref Rng & Units 08/15/2022    8:19 AM 08/08/2022    2:43 PM 07/31/2022    8:15 AM  CMP   Glucose 70 - 99 mg/dL 161  096  045   BUN 6 - 20 mg/dL 14  12  11    Creatinine 0.44 - 1.00 mg/dL 4.09  8.11  9.14   Sodium 135 - 145 mmol/L 134  135  135   Potassium 3.5 - 5.1 mmol/L 4.2  3.8  3.9   Chloride 98 - 111 mmol/L 99  100  103   CO2 22 - 32 mmol/L 27  28  25    Calcium 8.9 - 10.3 mg/dL 8.9  9.0  8.8   Total Protein 6.5 - 8.1 g/dL 6.9  6.9  6.4   Total Bilirubin 0.3 - 1.2 mg/dL 0.5  0.3  0.3   Alkaline Phos 38 - 126 U/L 67  91  66   AST 15 - 41 U/L 15  12  14    ALT 0 - 44 U/L 17  14  17      Lab Results  Component Value Date   WBC 6.1 08/15/2022   HGB 11.4 (L) 08/15/2022   HCT 33.4 (L) 08/15/2022   MCV 78.6 (L) 08/15/2022   PLT 180 08/15/2022   NEUTROABS 3.9 08/15/2022  ASSESSMENT & PLAN:  No problem-specific Assessment & Plan notes found for this encounter.    No orders of the defined types were placed in this encounter.  The patient has a good understanding of the overall plan. she agrees with it. she will call with any problems that may develop before the next visit here. Total time spent: 30 mins including face to face time and time spent for planning, charting and co-ordination of care   Sherlyn Lick, CMA 08/21/22    I Janan Ridge am acting as a Neurosurgeon for [employer]  ***

## 2022-08-22 ENCOUNTER — Inpatient Hospital Stay: Payer: Managed Care, Other (non HMO)

## 2022-08-22 ENCOUNTER — Inpatient Hospital Stay (HOSPITAL_BASED_OUTPATIENT_CLINIC_OR_DEPARTMENT_OTHER): Payer: Managed Care, Other (non HMO) | Admitting: Hematology and Oncology

## 2022-08-22 ENCOUNTER — Other Ambulatory Visit (HOSPITAL_COMMUNITY): Payer: Self-pay

## 2022-08-22 VITALS — HR 95

## 2022-08-22 VITALS — BP 151/69 | HR 112 | Temp 97.3°F | Resp 18 | Wt 327.7 lb

## 2022-08-22 DIAGNOSIS — Z95828 Presence of other vascular implants and grafts: Secondary | ICD-10-CM

## 2022-08-22 DIAGNOSIS — C50812 Malignant neoplasm of overlapping sites of left female breast: Secondary | ICD-10-CM

## 2022-08-22 DIAGNOSIS — Z170 Estrogen receptor positive status [ER+]: Secondary | ICD-10-CM | POA: Diagnosis not present

## 2022-08-22 LAB — CBC WITH DIFFERENTIAL (CANCER CENTER ONLY)
Abs Immature Granulocytes: 0.06 10*3/uL (ref 0.00–0.07)
Basophils Absolute: 0.1 10*3/uL (ref 0.0–0.1)
Basophils Relative: 1 %
Eosinophils Absolute: 0.1 10*3/uL (ref 0.0–0.5)
Eosinophils Relative: 1 %
HCT: 30.3 % — ABNORMAL LOW (ref 36.0–46.0)
Hemoglobin: 10.2 g/dL — ABNORMAL LOW (ref 12.0–15.0)
Immature Granulocytes: 1 %
Lymphocytes Relative: 12 %
Lymphs Abs: 0.9 10*3/uL (ref 0.7–4.0)
MCH: 27.1 pg (ref 26.0–34.0)
MCHC: 33.7 g/dL (ref 30.0–36.0)
MCV: 80.4 fL (ref 80.0–100.0)
Monocytes Absolute: 0.9 10*3/uL (ref 0.1–1.0)
Monocytes Relative: 13 %
Neutro Abs: 5.2 10*3/uL (ref 1.7–7.7)
Neutrophils Relative %: 72 %
Platelet Count: 260 10*3/uL (ref 150–400)
RBC: 3.77 MIL/uL — ABNORMAL LOW (ref 3.87–5.11)
RDW: 17.5 % — ABNORMAL HIGH (ref 11.5–15.5)
WBC Count: 7.2 10*3/uL (ref 4.0–10.5)
nRBC: 0 % (ref 0.0–0.2)

## 2022-08-22 LAB — CMP (CANCER CENTER ONLY)
ALT: 24 U/L (ref 0–44)
AST: 17 U/L (ref 15–41)
Albumin: 3.8 g/dL (ref 3.5–5.0)
Alkaline Phosphatase: 58 U/L (ref 38–126)
Anion gap: 8 (ref 5–15)
BUN: 13 mg/dL (ref 6–20)
CO2: 26 mmol/L (ref 22–32)
Calcium: 8.4 mg/dL — ABNORMAL LOW (ref 8.9–10.3)
Chloride: 101 mmol/L (ref 98–111)
Creatinine: 0.74 mg/dL (ref 0.44–1.00)
GFR, Estimated: >60 mL/min (ref >60)
Glucose, Bld: 206 mg/dL — ABNORMAL HIGH (ref 70–99)
Potassium: 4.2 mmol/L (ref 3.5–5.1)
Sodium: 135 mmol/L (ref 135–145)
Total Bilirubin: 0.5 mg/dL (ref 0.3–1.2)
Total Protein: 6.3 g/dL — ABNORMAL LOW (ref 6.5–8.1)

## 2022-08-22 LAB — PREGNANCY, URINE: Preg Test, Ur: NEGATIVE

## 2022-08-22 MED ORDER — SODIUM CHLORIDE 0.9 % IV SOLN
80.0000 mg/m2 | Freq: Once | INTRAVENOUS | Status: AC
  Administered 2022-08-22: 210 mg via INTRAVENOUS
  Filled 2022-08-22: qty 35

## 2022-08-22 MED ORDER — SODIUM CHLORIDE 0.9% FLUSH
10.0000 mL | INTRAVENOUS | Status: DC | PRN
  Administered 2022-08-22: 10 mL

## 2022-08-22 MED ORDER — DIPHENHYDRAMINE HCL 50 MG/ML IJ SOLN
50.0000 mg | Freq: Once | INTRAMUSCULAR | Status: AC
  Administered 2022-08-22: 50 mg via INTRAVENOUS
  Filled 2022-08-22: qty 1

## 2022-08-22 MED ORDER — FAMOTIDINE 20 MG IN NS 100 ML IVPB
20.0000 mg | Freq: Once | INTRAVENOUS | Status: AC
  Administered 2022-08-22: 20 mg via INTRAVENOUS
  Filled 2022-08-22: qty 100

## 2022-08-22 MED ORDER — SODIUM CHLORIDE 0.9 % IV SOLN: Freq: Once | INTRAVENOUS | Status: AC

## 2022-08-22 MED ORDER — SODIUM CHLORIDE 0.9 % IV SOLN
10.0000 mg | Freq: Once | INTRAVENOUS | Status: AC
  Administered 2022-08-22: 10 mg via INTRAVENOUS
  Filled 2022-08-22: qty 10

## 2022-08-22 MED ORDER — HEPARIN SOD (PORK) LOCK FLUSH 100 UNIT/ML IV SOLN
500.0000 [IU] | Freq: Once | INTRAVENOUS | Status: AC | PRN
  Administered 2022-08-22: 500 [IU]

## 2022-08-22 NOTE — Assessment & Plan Note (Addendum)
stage IIIB ER/PR positive breast cancer diagnosed in 06/2022 currently receiving neoadjuvant chemotherapy.     Treatment Plan:  1. Neoadjuvant chemotherapy with Adriamycin and Cytoxan dose dense 4 followed by Taxol weekly 12  2. Staging Scans with CT c/a/p and bone scan 3. Followed by breast conserving surgery with targeted axillary dissection 4. Followed by adjuvant radiation therapy 5.  Followed by antiestrogen therapy ____________________________________________________   Current treatment: Completed 4 cycles of Adriamycin and Cytoxan.  Today is cycle 2 Taxol  Chemo toxicities:  Fatigue: Slowly improving Treatment related anemia: mild, hemoglobin 10.2 Nausea: managed with compazine Mucositis: Magic Mouthwash  Hyperglycemia: She was able to get her Ozempic restarted.   Patient will come weekly for chemo on every other week for follow-up with [place].

## 2022-08-22 NOTE — Patient Instructions (Signed)
[facility]   Discharge Instructions: Thank you for choosing [facility] to provide your oncology and hematology care.   If you have a lab appointment with the Cancer Center, please go directly to the Cancer Center and check in at the registration area.   Wear comfortable clothing and clothing appropriate for easy access to any Portacath or PICC line.   We strive to give you quality time with your provider. You may need to reschedule your appointment if you arrive late (15 or more minutes).  Arriving late affects you and other patients whose appointments are after yours.  Also, if you miss three or more appointments without notifying the office, you may be dismissed from the clinic at the provider's discretion.      For prescription refill requests, have your pharmacy contact our office and allow 72 hours for refills to be completed.    Today you received the following chemotherapy and/or immunotherapy agents: Paclitaxel (Taxol)      To help prevent nausea and vomiting after your treatment, we encourage you to take your nausea medication as directed.  BELOW ARE SYMPTOMS THAT SHOULD BE REPORTED IMMEDIATELY: *FEVER GREATER THAN 100.4 F (38 C) OR HIGHER *CHILLS OR SWEATING *NAUSEA AND VOMITING THAT IS NOT CONTROLLED WITH YOUR NAUSEA MEDICATION *UNUSUAL SHORTNESS OF BREATH *UNUSUAL BRUISING OR BLEEDING *URINARY PROBLEMS (pain or burning when urinating, or frequent urination) *BOWEL PROBLEMS (unusual diarrhea, constipation, pain near the anus) TENDERNESS IN MOUTH AND THROAT WITH OR WITHOUT PRESENCE OF ULCERS (sore throat, sores in mouth, or a toothache) UNUSUAL RASH, SWELLING OR PAIN  UNUSUAL VAGINAL DISCHARGE OR ITCHING   Items with * indicate a potential emergency and should be followed up as soon as possible or go to the Emergency Department if any problems should occur.  Please show the CHEMOTHERAPY ALERT CARD or IMMUNOTHERAPY ALERT  CARD at check-in to the Emergency Department and triage nurse.  Should you have questions after your visit or need to cancel or reschedule your appointment, please contact [facility]  Dept: [phone]  and follow the prompts.  Office hours are 8:00 a.m. to 4:30 p.m. Monday - Friday. Please note that voicemails left after 4:00 p.m. may not be returned until the following business day.  We are closed weekends and major holidays. You have access to a nurse at all times for urgent questions. Please call the main number to the clinic Dept: [phone] and follow the prompts.   For any non-urgent questions, you may also contact your provider using MyChart. We now offer e-Visits for anyone 18 and older to request care online for non-urgent symptoms. For details visit [email or web address].   Also download the MyChart app! Go to the app store, search "MyChart", open the app, select [facility], and log in with your MyChart username and password.   

## 2022-08-23 ENCOUNTER — Telehealth: Payer: Self-pay | Admitting: Hematology and Oncology

## 2022-08-23 NOTE — Telephone Encounter (Signed)
Cancelled appointments per WQ. Patient is aware of the changes made to her upcoming appointments. 

## 2022-08-24 MED FILL — Dexamethasone Sodium Phosphate Inj 100 MG/10ML: INTRAMUSCULAR | Qty: 1 | Status: AC

## 2022-08-28 ENCOUNTER — Inpatient Hospital Stay: Payer: Managed Care, Other (non HMO)

## 2022-08-28 ENCOUNTER — Inpatient Hospital Stay: Payer: Managed Care, Other (non HMO) | Admitting: Adult Health

## 2022-08-28 ENCOUNTER — Other Ambulatory Visit: Payer: Self-pay | Admitting: Hematology and Oncology

## 2022-08-28 VITALS — BP 149/102 | HR 98 | Temp 97.9°F | Resp 18 | Wt 330.2 lb

## 2022-08-28 DIAGNOSIS — Z170 Estrogen receptor positive status [ER+]: Secondary | ICD-10-CM

## 2022-08-28 DIAGNOSIS — Z95828 Presence of other vascular implants and grafts: Secondary | ICD-10-CM

## 2022-08-28 DIAGNOSIS — C50812 Malignant neoplasm of overlapping sites of left female breast: Secondary | ICD-10-CM | POA: Diagnosis not present

## 2022-08-28 LAB — CBC WITH DIFFERENTIAL (CANCER CENTER ONLY)
Abs Immature Granulocytes: 0.05 10*3/uL (ref 0.00–0.07)
Basophils Absolute: 0.1 10*3/uL (ref 0.0–0.1)
Basophils Relative: 1 %
Eosinophils Absolute: 0.1 10*3/uL (ref 0.0–0.5)
Eosinophils Relative: 1 %
HCT: 28 % — ABNORMAL LOW (ref 36.0–46.0)
Hemoglobin: 9.6 g/dL — ABNORMAL LOW (ref 12.0–15.0)
Immature Granulocytes: 1 %
Lymphocytes Relative: 15 %
Lymphs Abs: 0.8 10*3/uL (ref 0.7–4.0)
MCH: 27.6 pg (ref 26.0–34.0)
MCHC: 34.3 g/dL (ref 30.0–36.0)
MCV: 80.5 fL (ref 80.0–100.0)
Monocytes Absolute: 0.6 10*3/uL (ref 0.1–1.0)
Monocytes Relative: 11 %
Neutro Abs: 3.5 10*3/uL (ref 1.7–7.7)
Neutrophils Relative %: 71 %
Platelet Count: 197 10*3/uL (ref 150–400)
RBC: 3.48 MIL/uL — ABNORMAL LOW (ref 3.87–5.11)
RDW: 18.3 % — ABNORMAL HIGH (ref 11.5–15.5)
WBC Count: 4.9 10*3/uL (ref 4.0–10.5)
nRBC: 0 % (ref 0.0–0.2)

## 2022-08-28 LAB — CMP (CANCER CENTER ONLY)
ALT: 23 U/L (ref 0–44)
AST: 16 U/L (ref 15–41)
Albumin: 3.7 g/dL (ref 3.5–5.0)
Alkaline Phosphatase: 45 U/L (ref 38–126)
Anion gap: 6 (ref 5–15)
BUN: 13 mg/dL (ref 6–20)
CO2: 27 mmol/L (ref 22–32)
Calcium: 8.3 mg/dL — ABNORMAL LOW (ref 8.9–10.3)
Chloride: 102 mmol/L (ref 98–111)
Creatinine: 0.65 mg/dL (ref 0.44–1.00)
GFR, Estimated: >60 mL/min (ref >60)
Glucose, Bld: 162 mg/dL — ABNORMAL HIGH (ref 70–99)
Potassium: 4.2 mmol/L (ref 3.5–5.1)
Sodium: 135 mmol/L (ref 135–145)
Total Bilirubin: 0.5 mg/dL (ref 0.3–1.2)
Total Protein: 6.3 g/dL — ABNORMAL LOW (ref 6.5–8.1)

## 2022-08-28 MED ORDER — SODIUM CHLORIDE 0.9 % IV SOLN: Freq: Once | INTRAVENOUS | Status: AC

## 2022-08-28 MED ORDER — FAMOTIDINE IN NACL 20-0.9 MG/50ML-% IV SOLN
20.0000 mg | Freq: Once | INTRAVENOUS | Status: AC
  Administered 2022-08-28: 20 mg via INTRAVENOUS
  Filled 2022-08-28: qty 50

## 2022-08-28 MED ORDER — SODIUM CHLORIDE 0.9 % IV SOLN
80.0000 mg/m2 | Freq: Once | INTRAVENOUS | Status: AC
  Administered 2022-08-28: 210 mg via INTRAVENOUS
  Filled 2022-08-28: qty 35

## 2022-08-28 MED ORDER — DIPHENHYDRAMINE HCL 50 MG/ML IJ SOLN
25.0000 mg | Freq: Once | INTRAMUSCULAR | Status: AC
  Administered 2022-08-28: 25 mg via INTRAVENOUS
  Filled 2022-08-28: qty 1

## 2022-08-28 MED ORDER — ONDANSETRON HCL 4 MG/2ML IJ SOLN
8.0000 mg | Freq: Once | INTRAMUSCULAR | Status: AC
  Administered 2022-08-28: 8 mg via INTRAVENOUS
  Filled 2022-08-28: qty 4

## 2022-08-28 MED ORDER — SODIUM CHLORIDE 0.9% FLUSH
10.0000 mL | INTRAVENOUS | Status: DC | PRN
  Administered 2022-08-28: 10 mL

## 2022-08-28 MED ORDER — SODIUM CHLORIDE 0.9 % IV SOLN
10.0000 mg | Freq: Once | INTRAVENOUS | Status: AC
  Administered 2022-08-28: 10 mg via INTRAVENOUS
  Filled 2022-08-28: qty 10

## 2022-08-29 ENCOUNTER — Other Ambulatory Visit (HOSPITAL_COMMUNITY): Payer: Self-pay

## 2022-08-30 ENCOUNTER — Other Ambulatory Visit: Payer: Self-pay

## 2022-09-03 MED FILL — Dexamethasone Sodium Phosphate Inj 100 MG/10ML: INTRAMUSCULAR | Qty: 1 | Status: AC

## 2022-09-04 ENCOUNTER — Inpatient Hospital Stay: Payer: Managed Care, Other (non HMO)

## 2022-09-04 ENCOUNTER — Inpatient Hospital Stay: Payer: Managed Care, Other (non HMO) | Attending: Hematology and Oncology

## 2022-09-04 ENCOUNTER — Inpatient Hospital Stay (HOSPITAL_BASED_OUTPATIENT_CLINIC_OR_DEPARTMENT_OTHER): Payer: Managed Care, Other (non HMO) | Admitting: Adult Health

## 2022-09-04 ENCOUNTER — Encounter: Payer: Self-pay | Admitting: Adult Health

## 2022-09-04 ENCOUNTER — Encounter: Payer: Self-pay | Admitting: Hematology and Oncology

## 2022-09-04 VITALS — BP 134/78 | HR 97 | Temp 97.7°F | Resp 16 | Wt 328.1 lb

## 2022-09-04 DIAGNOSIS — Z8052 Family history of malignant neoplasm of bladder: Secondary | ICD-10-CM | POA: Diagnosis not present

## 2022-09-04 DIAGNOSIS — Z801 Family history of malignant neoplasm of trachea, bronchus and lung: Secondary | ICD-10-CM | POA: Insufficient documentation

## 2022-09-04 DIAGNOSIS — Z170 Estrogen receptor positive status [ER+]: Secondary | ICD-10-CM

## 2022-09-04 DIAGNOSIS — Z800 Family history of malignant neoplasm of digestive organs: Secondary | ICD-10-CM | POA: Diagnosis not present

## 2022-09-04 DIAGNOSIS — Z5111 Encounter for antineoplastic chemotherapy: Secondary | ICD-10-CM | POA: Insufficient documentation

## 2022-09-04 DIAGNOSIS — D6481 Anemia due to antineoplastic chemotherapy: Secondary | ICD-10-CM | POA: Insufficient documentation

## 2022-09-04 DIAGNOSIS — R42 Dizziness and giddiness: Secondary | ICD-10-CM | POA: Diagnosis not present

## 2022-09-04 DIAGNOSIS — R0789 Other chest pain: Secondary | ICD-10-CM | POA: Insufficient documentation

## 2022-09-04 DIAGNOSIS — E1165 Type 2 diabetes mellitus with hyperglycemia: Secondary | ICD-10-CM | POA: Diagnosis not present

## 2022-09-04 DIAGNOSIS — R519 Headache, unspecified: Secondary | ICD-10-CM | POA: Insufficient documentation

## 2022-09-04 DIAGNOSIS — R200 Anesthesia of skin: Secondary | ICD-10-CM | POA: Diagnosis not present

## 2022-09-04 DIAGNOSIS — C50812 Malignant neoplasm of overlapping sites of left female breast: Secondary | ICD-10-CM | POA: Diagnosis present

## 2022-09-04 DIAGNOSIS — Z808 Family history of malignant neoplasm of other organs or systems: Secondary | ICD-10-CM | POA: Diagnosis not present

## 2022-09-04 DIAGNOSIS — G620 Drug-induced polyneuropathy: Secondary | ICD-10-CM | POA: Insufficient documentation

## 2022-09-04 DIAGNOSIS — Z95828 Presence of other vascular implants and grafts: Secondary | ICD-10-CM

## 2022-09-04 DIAGNOSIS — Z87891 Personal history of nicotine dependence: Secondary | ICD-10-CM | POA: Insufficient documentation

## 2022-09-04 LAB — CBC WITH DIFFERENTIAL (CANCER CENTER ONLY)
Abs Immature Granulocytes: 0.05 10*3/uL (ref 0.00–0.07)
Basophils Absolute: 0 10*3/uL (ref 0.0–0.1)
Basophils Relative: 1 %
Eosinophils Absolute: 0.1 10*3/uL (ref 0.0–0.5)
Eosinophils Relative: 2 %
HCT: 28.6 % — ABNORMAL LOW (ref 36.0–46.0)
Hemoglobin: 9.7 g/dL — ABNORMAL LOW (ref 12.0–15.0)
Immature Granulocytes: 1 %
Lymphocytes Relative: 17 %
Lymphs Abs: 0.7 10*3/uL (ref 0.7–4.0)
MCH: 28.2 pg (ref 26.0–34.0)
MCHC: 33.9 g/dL (ref 30.0–36.0)
MCV: 83.1 fL (ref 80.0–100.0)
Monocytes Absolute: 0.4 10*3/uL (ref 0.1–1.0)
Monocytes Relative: 9 %
Neutro Abs: 2.9 10*3/uL (ref 1.7–7.7)
Neutrophils Relative %: 70 %
Platelet Count: 217 10*3/uL (ref 150–400)
RBC: 3.44 MIL/uL — ABNORMAL LOW (ref 3.87–5.11)
RDW: 19.2 % — ABNORMAL HIGH (ref 11.5–15.5)
WBC Count: 4.2 10*3/uL (ref 4.0–10.5)
nRBC: 0.7 % — ABNORMAL HIGH (ref 0.0–0.2)

## 2022-09-04 LAB — CMP (CANCER CENTER ONLY)
ALT: 26 U/L (ref 0–44)
AST: 18 U/L (ref 15–41)
Albumin: 3.8 g/dL (ref 3.5–5.0)
Alkaline Phosphatase: 52 U/L (ref 38–126)
Anion gap: 7 (ref 5–15)
BUN: 11 mg/dL (ref 6–20)
CO2: 25 mmol/L (ref 22–32)
Calcium: 8.6 mg/dL — ABNORMAL LOW (ref 8.9–10.3)
Chloride: 103 mmol/L (ref 98–111)
Creatinine: 0.71 mg/dL (ref 0.44–1.00)
GFR, Estimated: >60 mL/min (ref >60)
Glucose, Bld: 184 mg/dL — ABNORMAL HIGH (ref 70–99)
Potassium: 4 mmol/L (ref 3.5–5.1)
Sodium: 135 mmol/L (ref 135–145)
Total Bilirubin: 0.6 mg/dL (ref 0.3–1.2)
Total Protein: 6.5 g/dL (ref 6.5–8.1)

## 2022-09-04 MED ORDER — SODIUM CHLORIDE 0.9 % IV SOLN: Freq: Once | INTRAVENOUS | Status: AC

## 2022-09-04 MED ORDER — SODIUM CHLORIDE 0.9 % IV SOLN
65.0000 mg/m2 | Freq: Once | INTRAVENOUS | Status: AC
  Administered 2022-09-04: 168 mg via INTRAVENOUS
  Filled 2022-09-04: qty 28

## 2022-09-04 MED ORDER — SODIUM CHLORIDE 0.9 % IV SOLN: INTRAVENOUS | Status: AC

## 2022-09-04 MED ORDER — PROCHLORPERAZINE EDISYLATE 10 MG/2ML IJ SOLN
10.0000 mg | Freq: Once | INTRAMUSCULAR | Status: AC
  Administered 2022-09-04: 10 mg via INTRAVENOUS
  Filled 2022-09-04: qty 2

## 2022-09-04 MED ORDER — FAMOTIDINE IN NACL 20-0.9 MG/50ML-% IV SOLN
20.0000 mg | Freq: Once | INTRAVENOUS | Status: AC
  Administered 2022-09-04: 20 mg via INTRAVENOUS
  Filled 2022-09-04: qty 50

## 2022-09-04 MED ORDER — SODIUM CHLORIDE 0.9% FLUSH
10.0000 mL | INTRAVENOUS | Status: DC | PRN
  Administered 2022-09-04: 10 mL

## 2022-09-04 MED ORDER — HEPARIN SOD (PORK) LOCK FLUSH 100 UNIT/ML IV SOLN
500.0000 [IU] | Freq: Once | INTRAVENOUS | Status: AC | PRN
  Administered 2022-09-04: 500 [IU]

## 2022-09-04 MED ORDER — DIPHENHYDRAMINE HCL 50 MG/ML IJ SOLN
25.0000 mg | Freq: Once | INTRAMUSCULAR | Status: AC
  Administered 2022-09-04: 25 mg via INTRAVENOUS
  Filled 2022-09-04: qty 1

## 2022-09-04 MED ORDER — SODIUM CHLORIDE 0.9 % IV SOLN
10.0000 mg | Freq: Once | INTRAVENOUS | Status: AC
  Administered 2022-09-04: 10 mg via INTRAVENOUS
  Filled 2022-09-04: qty 10

## 2022-09-04 NOTE — Progress Notes (Signed)
[facility] Cancer Follow up:    Lynn Scull, NP [address] [place] [address]   DIAGNOSIS:  Cancer Staging  Malignant neoplasm of overlapping sites of left breast in female, estrogen receptor positive (HCC) Staging form: Breast, AJCC 8th Edition - Clinical stage from 06/04/2022: Stage IIB (cT2, cN1(f), cM0, G3, ER+, PR+, HER2-) - Signed by Ronny Bacon, PA-C on 06/04/2022 Stage prefix: Initial diagnosis Method of lymph node assessment: Core biopsy Histologic grading system: 3 grade system - Clinical: Stage IIIB (cT3, cN3, cM0, G3, ER+, PR+, HER2-) - Signed by Loa Socks, NP on 06/15/2022 Stage prefix: Initial diagnosis Histologic grading system: 3 grade system   SUMMARY OF ONCOLOGIC HISTORY: Oncology History  Malignant neoplasm of overlapping sites of left breast in female, estrogen receptor positive (HCC)  05/29/2022 Initial Diagnosis   Palpable abnormalities in the left breast led to mammograms which revealed 3 masses 0.7 cm at 1 o'clock position, 3 cm at 2 o'clock position, additional nodule 8 mm total span 5 cm, 5 abnormal axillary lymph nodes biopsy of the masses and the lymph node were positive for grade 3 IDC with high-grade DCIS with LVI, ER 95%, PR 40%, Ki-67 60%, HER2 2+ by IHC FISH negative   06/15/2022 Cancer Staging   Staging form: Breast, AJCC 8th Edition - Clinical: Stage IIIB (cT3, cN3, cM0, G3, ER+, PR+, HER2-) - Signed by Loa Socks, NP on 06/15/2022 Stage prefix: Initial diagnosis Histologic grading system: 3 grade system   06/15/2022 Breast MRI   IMPRESSION: 1. Multiple sites of biopsy proven malignancy with in the lateral left breast. The overall conglomerate of suspicious findings spans approximately 4.4 x 3.5 x 3.5 cm, and contains all 3 post biopsy marking clips. 2. 10+ morphologically abnormal level I, II, III and Rotter's lymph nodes within the left axilla. 3. Diffuse skin and trabecular  thickening on the left, consistent with lymphovascular spread of disease. 4. No MRI evidence of malignancy on the right.   06/20/2022 -  Chemotherapy   Patient is on Treatment Plan : BREAST ADJUVANT DOSE DENSE AC q14d / PACLitaxel q7d      Genetic Testing   [facility] Multi-Cancer Panel+RNA was Negative. Report date is 06/13/2022.  The Multi-Cancer + RNA Panel offered by [facility] includes sequencing and/or deletion/duplication analysis of the following 70 genes:  AIP*, ALK, APC*, ATM*, AXIN2*, BAP1*, BARD1*, BLM*, BMPR1A*, BRCA1*, BRCA2*, BRIP1*, CDC73*, CDH1*, CDK4, CDKN1B*, CDKN2A, CHEK2*, CTNNA1*, DICER1*, EPCAM (del/dup only), EGFR, FH*, FLCN*, GREM1 (promoter dup only), HOXB13, KIT, LZTR1, MAX*, MBD4, MEN1*, MET, MITF, MLH1*, MSH2*, MSH3*, MSH6*, MUTYH*, NF1*, NF2*, NTHL1*, PALB2*, PDGFRA, PMS2*, POLD1*, POLE*, POT1*, PRKAR1A*, PTCH1*, PTEN*, RAD51C*, RAD51D*, RB1*, RET, SDHA* (sequencing only), SDHAF2*, SDHB*, SDHC*, SDHD*, SMAD4*, SMARCA4*, SMARCB1*, SMARCE1*, STK11*, SUFU*, TMEM127*, TP53*, TSC1*, TSC2*, VHL*. RNA analysis is performed for * genes.     CURRENT THERAPY: weekly Taxol  INTERVAL HISTORY: Lynn [name] 37 y.o. female returns for follow-up prior to receiving week 4 of Taxol chemotherapy.  Over the past couple of weeks she has been feeling increasingly dizzy.   This is accompanied by feeling hot, light headed on occasion.  She has had four episodes of feeling hot followed by vomiting.  She has increasing exhaustion.  Intermittent chest heaviness but denies associated shortness of breath or discomfort.  This heaviness occurs intermittently with no specific pattern.   She is also experiencing increased intermittent numbness in her toes.  She also says that her toes feel differently more  constantly.  She is also on Ozempic which she started back 2 to 3 weeks ago.  But sugars are improved today at 186 as compared to 238 which was its highest when we had checked it.  She is also been  experiencing increased headaches.  Sometimes these occur 4 times a day.  She feels like they could be sinus related.  Patient Active Problem List   Diagnosis Date Noted   Port-A-Cath in place 07/31/2022   Genetic testing 06/15/2022   Anxiety and depression 06/11/2022   Malignant neoplasm of overlapping sites of left breast in female, estrogen receptor positive (HCC) 06/04/2022   Syncope and collapse 03/12/2022   Umbilical hernia without obstruction and without gangrene 03/12/2022   Vitamin D deficiency 03/12/2022   Cervical radicular pain 12/07/2021   Snoring 12/07/2021   Other fatigue 12/07/2021   Chest pain 08/15/2021   Epigastric pain 08/15/2021   Acute pain of both shoulders 04/21/2021   Nerve pain 04/21/2021   Morbid obesity (HCC) 10/13/2020   Essential hypertension 03/12/2019   Chronic tachycardia 03/12/2019   Generalized anxiety disorder with panic attacks 03/12/2019   Family history of heart attack 03/12/2019   LVH (left ventricular hypertrophy) due to hypertensive disease, without heart failure 03/12/2019   Type 2 diabetes mellitus (HCC) 03/12/2019    is allergic to macrobid [nitrofurantoin macrocrystal] and ibuprofen.  MEDICAL HISTORY: Past Medical History:  Diagnosis Date   Cancer (HCC) 2024   L breast   Carpal tunnel syndrome, left    Diabetes (HCC)    Hypertension    Iron deficiency    Panic attacks    PCOS (polycystic ovarian syndrome)    Tachycardia     SURGICAL HISTORY: Past Surgical History:  Procedure Laterality Date   ADENOIDECTOMY     BREAST BIOPSY Left 05/29/2022   [place] LT BREAST BX W LOC DEV 1ST LESION IMG BX SPEC US GUIDE 05/29/2022 [facility]   BREAST BIOPSY Left 05/29/2022   [place] LT BREAST BX W LOC DEV EA ADD LESION IMG BX SPEC US GUIDE 05/29/2022 [facility]   BREAST BIOPSY Left 06/06/2022   MM LT BREAST BX W LOC DEV 1ST LESION IMAGE BX SPEC STEREO GUIDE 06/06/2022 [facility]   CESAREAN SECTION     PORTACATH PLACEMENT N/A  06/19/2022   Procedure: INSERTION PORT-A-CATH;  Surgeon: Emelia Loron, MD;  Location: [facility] OR;  Service: General;  Laterality: N/A;   TONSILLECTOMY      SOCIAL HISTORY: Social History   Socioeconomic History   Marital status: Significant Other    Spouse name: Not on file   Number of children: 1   Years of education: some college   Highest education level: Not on file  Occupational History   Not on file  Tobacco Use   Smoking status: Former    Packs/day: 1.00    Years: 7.00    Additional pack years: 0.00    Total pack years: 7.00    Types: Cigarettes, E-cigarettes    Quit date: 10/29/2021    Years since quitting: 0.8   Smokeless tobacco: Never  Vaping Use   Vaping Use: Former  Substance and Sexual Activity   Alcohol use: Yes    Comment: rarely   Drug use: No   Sexual activity: Yes    Birth control/protection: Condom  Other Topics Concern   Not on file  Social History Narrative      Social Determinants of Health   Financial Resource Strain: Not on file  Food  Insecurity: Not on file  Transportation Needs: Not on file  Physical Activity: Not on file  Stress: Not on file  Social Connections: Not on file  Intimate Partner Violence: Not on file    FAMILY HISTORY: Family History  Problem Relation Age of Onset   Heart disease Mother    Diabetes Mother    Hypertension Mother    Kidney disease Mother    Heart failure Mother    Heart attack Mother 47   Arthritis Mother    Asthma Mother    COPD Mother    Depression Mother    Hyperlipidemia Mother    Mental illness Mother    Bladder Cancer Mother    Heart disease Father    Heart attack Father 82   COPD Father    Hyperlipidemia Father    Hypertension Father    Mental illness Father    Arthritis Sister    Depression Sister    Diabetes Sister    Hypertension Sister    Mental illness Sister    Miscarriages / Stillbirths Sister    Alcohol abuse Sister    Depression Sister    Diabetes Sister    Heart  disease Sister    Heart attack Sister 44   Arthritis Sister    Kidney disease Sister    Transient ischemic attack Sister    Bladder Cancer Maternal Aunt    Colon cancer Maternal Aunt 27 - 35   Other Maternal Aunt 62 - 59       brain tumor (unknown if benign or malignant)   Lung cancer Paternal Aunt 33   Melanoma Paternal Aunt 18 - 49   Heart failure Maternal Grandmother    Arthritis Maternal Grandmother    Alcohol abuse Maternal Grandmother    COPD Maternal Grandmother    Diabetes Maternal Grandmother    Hearing loss Maternal Grandmother    Heart disease Maternal Grandmother    Hyperlipidemia Maternal Grandmother    Hypertension Maternal Grandmother    Stroke Maternal Grandmother    Miscarriages / Stillbirths Maternal Grandmother    Mental illness Maternal Grandmother    Heart attack Maternal Grandmother    Heart failure Maternal Grandfather    Alcohol abuse Maternal Grandfather    Arthritis Maternal Grandfather    Early death Maternal Grandfather    Heart disease Maternal Grandfather    Hyperlipidemia Maternal Grandfather    Hypertension Maternal Grandfather    Heart attack Maternal Grandfather 60   Mental illness Maternal Grandfather    Heart failure Paternal Grandmother    Arthritis Paternal Grandmother    Diabetes Paternal Grandmother    Heart attack Paternal Grandmother    Miscarriages / Stillbirths Paternal Grandmother    Mental illness Paternal Grandmother    Kidney disease Paternal Grandmother    Intellectual disability Paternal Grandmother    Hypertension Paternal Grandmother    Hyperlipidemia Paternal Grandmother    Heart disease Paternal Grandmother    Heart failure Paternal Grandfather    Arthritis Paternal Grandfather    Heart attack Paternal Grandfather    Mental illness Paternal Grandfather    Hearing loss Paternal Grandfather    Heart disease Paternal Grandfather    Hyperlipidemia Paternal Grandfather    Hypertension Paternal Grandfather      Review of Systems  Constitutional:  Positive for fatigue. Negative for appetite change, chills, fever and unexpected weight change.  HENT:   Negative for hearing loss, lump/mass and trouble swallowing.   Eyes:  Negative for eye problems and  icterus.  Respiratory:  Negative for chest tightness, cough and shortness of breath.   Cardiovascular:  Negative for chest pain, leg swelling and palpitations.  Gastrointestinal:  Negative for abdominal distention, abdominal pain, constipation, diarrhea, nausea and vomiting.  Endocrine: Negative for hot flashes.  Genitourinary:  Negative for difficulty urinating.   Musculoskeletal:  Negative for arthralgias.  Skin:  Negative for itching and rash.  Neurological:  Positive for dizziness, headaches, light-headedness and numbness. Negative for extremity weakness.  Hematological:  Negative for adenopathy. Does not bruise/bleed easily.  Psychiatric/Behavioral:  Negative for depression. The patient is not nervous/anxious.       PHYSICAL EXAMINATION   Onc Performance Status - 09/04/22 0842       ECOG Perf Status   ECOG Perf Status Restricted in physically strenuous activity but ambulatory and able to carry out work of a light or sedentary nature, e.g., light house work, office work             Vitals:   09/04/22 0953 09/04/22 0957  BP: (!) 136/98 134/78  Pulse: 92 97  Resp:    Temp:    SpO2:     Orthostatics demonstrate increase in pulse from supine to standing by 10 bpm Physical Exam Constitutional:      General: She is not in acute distress.    Appearance: Normal appearance. She is not toxic-appearing.  HENT:     Head: Normocephalic and atraumatic.     Mouth/Throat:     Mouth: Mucous membranes are moist.     Pharynx: Oropharynx is clear. No oropharyngeal exudate or posterior oropharyngeal erythema.  Eyes:     General: No scleral icterus. Cardiovascular:     Rate and Rhythm: Normal rate and regular rhythm.     Pulses: Normal  pulses.     Heart sounds: Normal heart sounds.  Pulmonary:     Effort: Pulmonary effort is normal.     Breath sounds: Normal breath sounds.  Abdominal:     General: Abdomen is flat. Bowel sounds are normal. There is no distension.     Palpations: Abdomen is soft.     Tenderness: There is no abdominal tenderness.  Musculoskeletal:        General: No swelling.     Cervical back: Neck supple.  Lymphadenopathy:     Cervical: No cervical adenopathy.  Skin:    General: Skin is warm and dry.     Findings: No rash.  Neurological:     General: No focal deficit present.     Mental Status: She is alert.  Psychiatric:        Mood and Affect: Mood normal.        Behavior: Behavior normal.     LABORATORY DATA:  CBC    Component Value Date/Time   WBC 4.2 09/04/2022 0823   WBC 8.0 02/14/2022 1027   RBC 3.44 (L) 09/04/2022 0823   HGB 9.7 (L) 09/04/2022 0823   HCT 28.6 (L) 09/04/2022 0823   PLT 217 09/04/2022 0823   MCV 83.1 09/04/2022 0823   MCH 28.2 09/04/2022 0823   MCHC 33.9 09/04/2022 0823   RDW 19.2 (H) 09/04/2022 0823   LYMPHSABS 0.7 09/04/2022 0823   MONOABS 0.4 09/04/2022 0823   EOSABS 0.1 09/04/2022 0823   BASOSABS 0.0 09/04/2022 0823    CMP     Component Value Date/Time   NA 135 09/04/2022 0823   K 4.0 09/04/2022 0823   CL 103 09/04/2022 0823   CO2 25 09/04/2022 8295  GLUCOSE 184 (H) 09/04/2022 0823   BUN 11 09/04/2022 0823   CREATININE 0.71 09/04/2022 0823   CALCIUM 8.6 (L) 09/04/2022 0823   PROT 6.5 09/04/2022 0823   ALBUMIN 3.8 09/04/2022 0823   AST 18 09/04/2022 0823   ALT 26 09/04/2022 0823   ALKPHOS 52 09/04/2022 0823   BILITOT 0.6 09/04/2022 0823   GFRNONAA >60 09/04/2022 0823   GFRAA >60 01/02/2020 2048         ASSESSMENT and THERAPY PLAN:   Malignant neoplasm of overlapping sites of left breast in female, estrogen receptor positive (HCC) stage IIIB ER/PR positive breast cancer diagnosed in 06/2022 currently receiving neoadjuvant  chemotherapy.     Treatment Plan:  1. Neoadjuvant chemotherapy with Adriamycin and Cytoxan dose dense 4 followed by Taxol weekly 12  2. Staging Scans with CT c/a/p and bone scan 3. Followed by breast conserving surgery with targeted axillary dissection 4. Followed by adjuvant radiation therapy 5.  Followed by antiestrogen therapy ____________________________________________________   Current treatment: Completed 4 cycles of Adriamycin and Cytoxan.  Today is cycle 4 Taxol  Chemo toxicities:  Fatigue: Managed with energy conservation Treatment related anemia: Hemoglobin is stable today at 9.7 Nausea: Compazine as needed. Headaches: I remove the ondansetron from her premedication list.  I also recommended that she avoid taking this when she feels nauseated at home. 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  Her chemistry panel is normal today. Chest heaviness, EKG shows NSR.  I will also order a repeat echocardiogram. Hyperglycemia: Improved.  She has restarted Ozempic.   Patient will come weekly for chemo on every other week for follow-up with [place].    All questions were answered. The patient knows to call the clinic with any problems, questions or concerns. We can certainly see the patient much sooner if necessary.  Total encounter time:45 minutes*in face-to-face visit time, chart review, lab review, care coordination, order entry, and documentation of the encounter time.    Lillard Anes, NP 09/04/22 10:02 AM Medical Oncology and Hematology [facility] [address] [place], [place] [address] Tel. [phone]    Fax. [phone]  *Total Encounter Time as defined by the Centers for Medicare and Medicaid Services includes, in addition to the face-to-face time of a patient visit (documented in the note above)  non-face-to-face time: obtaining and reviewing outside history, ordering and reviewing medications, tests or procedures, care coordination (communications with other health care professionals or caregivers) and documentation in the medical record.

## 2022-09-04 NOTE — Assessment & Plan Note (Addendum)
stage IIIB ER/PR positive breast cancer diagnosed in 06/2022 currently receiving neoadjuvant chemotherapy.     Treatment Plan:  1. Neoadjuvant chemotherapy with Adriamycin and Cytoxan dose dense 4 followed by Taxol weekly 12  2. Staging Scans with CT c/a/p and bone scan 3. Followed by breast conserving surgery with targeted axillary dissection 4. Followed by adjuvant radiation therapy 5.  Followed by antiestrogen therapy ____________________________________________________   Current treatment: Completed 4 cycles of Adriamycin and Cytoxan.  Today is cycle 4 Taxol  Chemo toxicities:  Fatigue: Managed with energy conservation Treatment related anemia: Hemoglobin is stable today at 9.7 Nausea: Compazine as needed. Headaches: I remove the ondansetron from her premedication list.  I also recommended that she avoid taking this when she feels nauseated at home. 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  Her chemistry panel is normal today. Chest heaviness, EKG shows NSR.  I will also order a repeat echocardiogram. Hyperglycemia: Improved.  She has restarted Ozempic.   Patient will come weekly for chemo on every other week for follow-up with [place].

## 2022-09-04 NOTE — Patient Instructions (Signed)
[facility]  Discharge Instructions: Thank you for choosing [facility] to provide your oncology and hematology care.   If you have a lab appointment with the Cancer Center, please go directly to the Cancer Center and check in at the registration area.   Wear comfortable clothing and clothing appropriate for easy access to any Portacath or PICC line.   We strive to give you quality time with your provider. You may need to reschedule your appointment if you arrive late (15 or more minutes).  Arriving late affects you and other patients whose appointments are after yours.  Also, if you miss three or more appointments without notifying the office, you may be dismissed from the clinic at the provider's discretion.      For prescription refill requests, have your pharmacy contact our office and allow 72 hours for refills to be completed.    Today you received the following chemotherapy and/or immunotherapy agents: paclitaxel      To help prevent nausea and vomiting after your treatment, we encourage you to take your nausea medication as directed.  BELOW ARE SYMPTOMS THAT SHOULD BE REPORTED IMMEDIATELY: *FEVER GREATER THAN 100.4 F (38 C) OR HIGHER *CHILLS OR SWEATING *NAUSEA AND VOMITING THAT IS NOT CONTROLLED WITH YOUR NAUSEA MEDICATION *UNUSUAL SHORTNESS OF BREATH *UNUSUAL BRUISING OR BLEEDING *URINARY PROBLEMS (pain or burning when urinating, or frequent urination) *BOWEL PROBLEMS (unusual diarrhea, constipation, pain near the anus) TENDERNESS IN MOUTH AND THROAT WITH OR WITHOUT PRESENCE OF ULCERS (sore throat, sores in mouth, or a toothache) UNUSUAL RASH, SWELLING OR PAIN  UNUSUAL VAGINAL DISCHARGE OR ITCHING   Items with * indicate a potential emergency and should be followed up as soon as possible or go to the Emergency Department if any problems should occur.  Please show the CHEMOTHERAPY ALERT CARD or IMMUNOTHERAPY ALERT CARD at  check-in to the Emergency Department and triage nurse.  Should you have questions after your visit or need to cancel or reschedule your appointment, please contact [facility]  Dept: [phone]  and follow the prompts.  Office hours are 8:00 a.m. to 4:30 p.m. Monday - Friday. Please note that voicemails left after 4:00 p.m. may not be returned until the following business day.  We are closed weekends and major holidays. You have access to a nurse at all times for urgent questions. Please call the main number to the clinic Dept: [phone] and follow the prompts.   For any non-urgent questions, you may also contact your provider using MyChart. We now offer e-Visits for anyone 18 and older to request care online for non-urgent symptoms. For details visit [email or web address].   Also download the MyChart app! Go to the app store, search "MyChart", open the app, select [facility], and log in with your MyChart username and password.   

## 2022-09-05 NOTE — Progress Notes (Signed)
Patient Care Team: Gerre Scull, NP as PCP - General (Internal Medicine) Gweneth Dimitri, MD as Consulting Physician (Family Medicine) Gweneth Dimitri, MD as Consulting Physician (Family Medicine) Serena Croissant, MD as Consulting Physician (Hematology and Oncology) Emelia Loron, MD as Consulting Physician (General Surgery) Dorothy Puffer, MD as Consulting Physician (Radiation Oncology) Donnelly Angelica, RN as Oncology Nurse Navigator Pershing Proud, RN as Oncology Nurse Navigator  DIAGNOSIS:  Encounter Diagnosis  Name Primary?   Malignant neoplasm of overlapping sites of left breast in female, estrogen receptor positive (HCC) Yes    SUMMARY OF ONCOLOGIC HISTORY: Oncology History  Malignant neoplasm of overlapping sites of left breast in female, estrogen receptor positive (HCC)  05/29/2022 Initial Diagnosis   Palpable abnormalities in the left breast led to mammograms which revealed 3 masses 0.7 cm at 1 o'clock position, 3 cm at 2 o'clock position, additional nodule 8 mm total span 5 cm, 5 abnormal axillary lymph nodes biopsy of the masses and the lymph node were positive for grade 3 IDC with high-grade DCIS with LVI, ER 95%, PR 40%, Ki-67 60%, HER2 2+ by IHC FISH negative   06/15/2022 Cancer Staging   Staging form: Breast, AJCC 8th Edition - Clinical: Stage IIIB (cT3, cN3, cM0, G3, ER+, PR+, HER2-) - Signed by Loa Socks, NP on 06/15/2022 Stage prefix: Initial diagnosis Histologic grading system: 3 grade system   06/15/2022 Breast MRI   IMPRESSION: 1. Multiple sites of biopsy proven malignancy with in the lateral left breast. The overall conglomerate of suspicious findings spans approximately 4.4 x 3.5 x 3.5 cm, and contains all 3 post biopsy marking clips. 2. 10+ morphologically abnormal level I, II, III and Rotter's lymph nodes within the left axilla. 3. Diffuse skin and trabecular thickening on the left, consistent with lymphovascular spread of disease. 4.  No MRI evidence of malignancy on the right.   06/20/2022 -  Chemotherapy   Patient is on Treatment Plan : BREAST ADJUVANT DOSE DENSE AC q14d / PACLitaxel q7d      Genetic Testing   [facility] Multi-Cancer Panel+RNA was Negative. Report date is 06/13/2022.  The Multi-Cancer + RNA Panel offered by [facility] includes sequencing and/or deletion/duplication analysis of the following 70 genes:  AIP*, ALK, APC*, ATM*, AXIN2*, BAP1*, BARD1*, BLM*, BMPR1A*, BRCA1*, BRCA2*, BRIP1*, CDC73*, CDH1*, CDK4, CDKN1B*, CDKN2A, CHEK2*, CTNNA1*, DICER1*, EPCAM (del/dup only), EGFR, FH*, FLCN*, GREM1 (promoter dup only), HOXB13, KIT, LZTR1, MAX*, MBD4, MEN1*, MET, MITF, MLH1*, MSH2*, MSH3*, MSH6*, MUTYH*, NF1*, NF2*, NTHL1*, PALB2*, PDGFRA, PMS2*, POLD1*, POLE*, POT1*, PRKAR1A*, PTCH1*, PTEN*, RAD51C*, RAD51D*, RB1*, RET, SDHA* (sequencing only), SDHAF2*, SDHB*, SDHC*, SDHD*, SMAD4*, SMARCA4*, SMARCB1*, SMARCE1*, STK11*, SUFU*, TMEM127*, TP53*, TSC1*, TSC2*, VHL*. RNA analysis is performed for * genes.     CHIEF COMPLIANT:  Cycle 6 AC    INTERVAL HISTORY: Lynn [name] is a 37 year old above-mentioned history of breast cancer is currently on chemotherapy with a dose dense Adriamycin and Cytoxan today is cycle 6.  She reports that she is getting tired very quickly. She also is having some dizziness when bending. States that she is very exhausted. She has some numbness and pain in fingers denies toes. She has not stumbled, but she has dropped objects.  ALLERGIES:  is allergic to macrobid [nitrofurantoin macrocrystal] and ibuprofen.  MEDICATIONS:  Current Outpatient Medications  Medication Sig Dispense Refill   acetaminophen (TYLENOL) 500 MG tablet Take 2 tablets (1,000 mg total) by mouth every 6 (six) hours as needed for moderate pain or headache. 30 tablet  0   ALPRAZolam (XANAX) 0.5 MG tablet Take 1 tablet (0.5 mg total) by mouth at bedtime as needed for anxiety. 30 tablet 1   aspirin EC 325 MG tablet Take 325 mg by  mouth in the morning.     aspirin-acetaminophen-caffeine (EXCEDRIN MIGRAINE) 250-250-65 MG tablet Take 1-2 tablets by mouth 2 (two) times daily as needed for headache or migraine.     Blood Glucose Monitoring Suppl (ACCU-CHEK AVIVA PLUS) w/Device KIT 1 each by Does not apply route daily at 6 (six) AM. 1 kit 0   carvedilol (COREG) 25 MG tablet Take 1 tablet (25 mg total) by mouth 2 (two) times daily with a meal. 180 tablet 1   cyclobenzaprine (FLEXERIL) 10 MG tablet Take 1 tablet (10 mg total) by mouth 3 (three) times daily as needed for muscle spasms. 30 tablet 1   famotidine (PEPCID) 20 MG tablet TAKE 1 TABLET(20 MG) BY MOUTH TWICE DAILY 180 tablet 0   glucose blood (ACCU-CHEK AVIVA PLUS) test strip Use as instructed 100 each 12   lidocaine (XYLOCAINE) 2 % jelly Apply 1 Application topically as needed. 85 g 0   lidocaine-prilocaine (EMLA) cream Apply to affected area once 30 g 3   magic mouthwash (lidocaine, diphenhydrAMINE, alum & mag hydroxide) suspension Take 5 mLs by mouth 3 (three) times daily as needed for mouth pain. 120 mL 0   ondansetron (ZOFRAN) 8 MG tablet Take 1 tab (8 mg) by mouth every 8 hrs as needed for nausea/vomiting. Start third day after doxorubicin/cyclophosphamide chemotherapy. 30 tablet 1   ONE TOUCH CLUB LANCETS MISC 1 each by Does not apply route daily. 100 each 11   prochlorperazine (COMPAZINE) 10 MG tablet Take 1 tablet (10 mg total) by mouth every 6 (six) hours as needed for nausea or vomiting. 30 tablet 1   Semaglutide,0.25 or 0.5MG /DOS, (OZEMPIC, 0.25 OR 0.5 MG/DOSE,) 2 MG/3ML SOPN Inject 0.25 mg into the skin once a week. 3 mL 3   No current facility-administered medications for this visit.    PHYSICAL EXAMINATION: ECOG PERFORMANCE STATUS: 1 - Symptomatic but completely ambulatory  Vitals:   09/18/22 0844  BP: (!) 143/77  Pulse: (!) 109  Resp: 18  Temp: (!) 97.5 F (36.4 C)  SpO2: 96%   Filed Weights   09/18/22 0844  Weight: (!) 331 lb 4.8 oz (150.3 kg)       LABORATORY DATA:  I have reviewed the data as listed    Latest Ref Rng & Units 09/11/2022    8:45 AM 09/04/2022    8:23 AM 08/28/2022    9:26 AM  CMP  Glucose 70 - 99 mg/dL 409  811  914   BUN 6 - 20 mg/dL 16  11  13    Creatinine 0.44 - 1.00 mg/dL 7.82  9.56  2.13   Sodium 135 - 145 mmol/L 137  135  135   Potassium 3.5 - 5.1 mmol/L 4.2  4.0  4.2   Chloride 98 - 111 mmol/L 104  103  102   CO2 22 - 32 mmol/L 26  25  27    Calcium 8.9 - 10.3 mg/dL 8.5  8.6  8.3   Total Protein 6.5 - 8.1 g/dL 6.6  6.5  6.3   Total Bilirubin 0.3 - 1.2 mg/dL 0.5  0.6  0.5   Alkaline Phos 38 - 126 U/L 55  52  45   AST 15 - 41 U/L 18  18  16    ALT 0 - 44  U/L 23  26  23      Lab Results  Component Value Date   WBC 4.8 09/18/2022   HGB 9.8 (L) 09/18/2022   HCT 28.9 (L) 09/18/2022   MCV 86.8 09/18/2022   PLT 233 09/18/2022   NEUTROABS 3.3 09/18/2022    ASSESSMENT & PLAN:  Malignant neoplasm of overlapping sites of left breast in female, 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     Treatment Plan:  1. Neoadjuvant chemotherapy with Adriamycin and Cytoxan dose dense 4 followed by Taxol weekly 12  2. Staging Scans with CT c/a/p and bone scan 3. Followed by breast conserving surgery with targeted axillary dissection 4. Followed by adjuvant radiation therapy 5.  Followed by antiestrogen therapy ____________________________________________________   Current treatment: Completed 4 cycles of Adriamycin and Cytoxan.  Today is cycle 6 Taxol  Chemo toxicities:  Fatigue: Slowly improving Treatment related anemia: mild, hemoglobin 9.8, stable Nausea: managed with compazine Mucositis: Magic Mouthwash  Hyperglycemia: She was able to get her Ozempic restarted. Headaches: Improved after stopping Zofran. IV fluids will be added to each of her treatments because of significant improvement in how she feels  after the fluids were given. Peripheral neuropathy: Grade 1: Monitoring   Patient will come weekly for chemo on every other week for follow-up with [place].  No orders of the defined types were placed in this encounter.  The patient has a good understanding of the overall plan. she agrees with it. she will call with any problems that may develop before the next visit here. Total time spent: 30 mins including face to face time and time spent for planning, charting and co-ordination of care   Tamsen Meek, MD 09/18/22    I Janan Ridge am acting as a Neurosurgeon for [employer]  I have reviewed the above documentation for accuracy and completeness, and I agree with the above.

## 2022-09-06 ENCOUNTER — Ambulatory Visit (HOSPITAL_COMMUNITY)
Admission: RE | Admit: 2022-09-06 | Discharge: 2022-09-06 | Disposition: A | Discharge status: Home / Self Care | Payer: Managed Care, Other (non HMO) | Source: Ambulatory Visit | Attending: Adult Health | Admitting: Adult Health

## 2022-09-06 ENCOUNTER — Other Ambulatory Visit: Payer: Self-pay

## 2022-09-06 DIAGNOSIS — R55 Syncope and collapse: Secondary | ICD-10-CM | POA: Insufficient documentation

## 2022-09-06 DIAGNOSIS — R0789 Other chest pain: Secondary | ICD-10-CM

## 2022-09-06 DIAGNOSIS — Z0189 Encounter for other specified special examinations: Secondary | ICD-10-CM

## 2022-09-06 DIAGNOSIS — I517 Cardiomegaly: Secondary | ICD-10-CM | POA: Insufficient documentation

## 2022-09-06 DIAGNOSIS — E119 Type 2 diabetes mellitus without complications: Secondary | ICD-10-CM | POA: Insufficient documentation

## 2022-09-06 DIAGNOSIS — C50812 Malignant neoplasm of overlapping sites of left female breast: Secondary | ICD-10-CM | POA: Insufficient documentation

## 2022-09-06 DIAGNOSIS — Z87891 Personal history of nicotine dependence: Secondary | ICD-10-CM | POA: Diagnosis not present

## 2022-09-06 DIAGNOSIS — Z170 Estrogen receptor positive status [ER+]: Secondary | ICD-10-CM

## 2022-09-06 DIAGNOSIS — R000 Tachycardia, unspecified: Secondary | ICD-10-CM | POA: Diagnosis not present

## 2022-09-06 DIAGNOSIS — R079 Chest pain, unspecified: Secondary | ICD-10-CM | POA: Diagnosis not present

## 2022-09-06 DIAGNOSIS — I10 Essential (primary) hypertension: Secondary | ICD-10-CM | POA: Diagnosis not present

## 2022-09-06 LAB — ECHOCARDIOGRAM COMPLETE
AV Mean grad: 4 mmHg
AV Peak grad: 6.8 mmHg
Ao pk vel: 1.31 m/s
Area-P 1/2: 4.68 cm2
Calc EF: 61.9 %
S' Lateral: 2.7 cm
Single Plane A2C EF: 61.7 %
Single Plane A4C EF: 62.6 %

## 2022-09-10 MED FILL — Dexamethasone Sodium Phosphate Inj 100 MG/10ML: INTRAMUSCULAR | Qty: 1 | Status: AC

## 2022-09-11 ENCOUNTER — Inpatient Hospital Stay: Payer: Managed Care, Other (non HMO) | Admitting: Adult Health

## 2022-09-11 ENCOUNTER — Inpatient Hospital Stay: Payer: Managed Care, Other (non HMO)

## 2022-09-11 ENCOUNTER — Other Ambulatory Visit: Payer: Self-pay

## 2022-09-11 VITALS — BP 137/84 | HR 102 | Temp 97.7°F | Resp 18 | Wt 331.0 lb

## 2022-09-11 DIAGNOSIS — C50812 Malignant neoplasm of overlapping sites of left female breast: Secondary | ICD-10-CM

## 2022-09-11 DIAGNOSIS — Z95828 Presence of other vascular implants and grafts: Secondary | ICD-10-CM

## 2022-09-11 DIAGNOSIS — Z5111 Encounter for antineoplastic chemotherapy: Secondary | ICD-10-CM | POA: Diagnosis not present

## 2022-09-11 LAB — CBC WITH DIFFERENTIAL (CANCER CENTER ONLY)
Abs Immature Granulocytes: 0.05 10*3/uL (ref 0.00–0.07)
Basophils Absolute: 0.1 10*3/uL (ref 0.0–0.1)
Basophils Relative: 1 %
Eosinophils Absolute: 0.1 10*3/uL (ref 0.0–0.5)
Eosinophils Relative: 1 %
HCT: 28.6 % — ABNORMAL LOW (ref 36.0–46.0)
Hemoglobin: 9.8 g/dL — ABNORMAL LOW (ref 12.0–15.0)
Immature Granulocytes: 1 %
Lymphocytes Relative: 15 %
Lymphs Abs: 0.8 10*3/uL (ref 0.7–4.0)
MCH: 29 pg (ref 26.0–34.0)
MCHC: 34.3 g/dL (ref 30.0–36.0)
MCV: 84.6 fL (ref 80.0–100.0)
Monocytes Absolute: 0.6 10*3/uL (ref 0.1–1.0)
Monocytes Relative: 12 %
Neutro Abs: 3.8 10*3/uL (ref 1.7–7.7)
Neutrophils Relative %: 70 %
Platelet Count: 221 10*3/uL (ref 150–400)
RBC: 3.38 MIL/uL — ABNORMAL LOW (ref 3.87–5.11)
RDW: 19.8 % — ABNORMAL HIGH (ref 11.5–15.5)
WBC Count: 5.4 10*3/uL (ref 4.0–10.5)
nRBC: 0 % (ref 0.0–0.2)

## 2022-09-11 LAB — CMP (CANCER CENTER ONLY)
ALT: 23 U/L (ref 0–44)
AST: 18 U/L (ref 15–41)
Albumin: 3.8 g/dL (ref 3.5–5.0)
Alkaline Phosphatase: 55 U/L (ref 38–126)
Anion gap: 7 (ref 5–15)
BUN: 16 mg/dL (ref 6–20)
CO2: 26 mmol/L (ref 22–32)
Calcium: 8.5 mg/dL — ABNORMAL LOW (ref 8.9–10.3)
Chloride: 104 mmol/L (ref 98–111)
Creatinine: 0.78 mg/dL (ref 0.44–1.00)
GFR, Estimated: >60 mL/min (ref >60)
Glucose, Bld: 230 mg/dL — ABNORMAL HIGH (ref 70–99)
Potassium: 4.2 mmol/L (ref 3.5–5.1)
Sodium: 137 mmol/L (ref 135–145)
Total Bilirubin: 0.5 mg/dL (ref 0.3–1.2)
Total Protein: 6.6 g/dL (ref 6.5–8.1)

## 2022-09-11 LAB — PREGNANCY, URINE: Preg Test, Ur: NEGATIVE

## 2022-09-11 MED ORDER — SODIUM CHLORIDE 0.9 % IV SOLN
10.0000 mg | Freq: Once | INTRAVENOUS | Status: AC
  Administered 2022-09-11: 10 mg via INTRAVENOUS
  Filled 2022-09-11: qty 10

## 2022-09-11 MED ORDER — PROCHLORPERAZINE EDISYLATE 10 MG/2ML IJ SOLN
10.0000 mg | Freq: Once | INTRAMUSCULAR | Status: AC
  Administered 2022-09-11: 10 mg via INTRAVENOUS
  Filled 2022-09-11: qty 2

## 2022-09-11 MED ORDER — SODIUM CHLORIDE 0.9 % IV SOLN
65.0000 mg/m2 | Freq: Once | INTRAVENOUS | Status: AC
  Administered 2022-09-11: 168 mg via INTRAVENOUS
  Filled 2022-09-11: qty 28

## 2022-09-11 MED ORDER — SODIUM CHLORIDE 0.9% FLUSH: 10.0000 mL | INTRAVENOUS | Status: DC | PRN

## 2022-09-11 MED ORDER — FAMOTIDINE IN NACL 20-0.9 MG/50ML-% IV SOLN
20.0000 mg | Freq: Once | INTRAVENOUS | Status: AC
  Administered 2022-09-11: 20 mg via INTRAVENOUS
  Filled 2022-09-11: qty 50

## 2022-09-11 MED ORDER — SODIUM CHLORIDE 0.9% FLUSH
10.0000 mL | INTRAVENOUS | Status: DC | PRN
  Administered 2022-09-11: 10 mL

## 2022-09-11 MED ORDER — SODIUM CHLORIDE 0.9 % IV SOLN: Freq: Once | INTRAVENOUS | Status: AC

## 2022-09-11 MED ORDER — DIPHENHYDRAMINE HCL 50 MG/ML IJ SOLN
25.0000 mg | Freq: Once | INTRAMUSCULAR | Status: AC
  Administered 2022-09-11: 25 mg via INTRAVENOUS
  Filled 2022-09-11: qty 1

## 2022-09-11 MED ORDER — HEPARIN SOD (PORK) LOCK FLUSH 100 UNIT/ML IV SOLN: 500.0000 [IU] | Freq: Once | INTRAVENOUS | Status: DC | PRN

## 2022-09-11 NOTE — Patient Instructions (Signed)
[facility]  Discharge Instructions: Thank you for choosing [facility] to provide your oncology and hematology care.   If you have a lab appointment with the Cancer Center, please go directly to the Cancer Center and check in at the registration area.   Wear comfortable clothing and clothing appropriate for easy access to any Portacath or PICC line.   We strive to give you quality time with your provider. You may need to reschedule your appointment if you arrive late (15 or more minutes).  Arriving late affects you and other patients whose appointments are after yours.  Also, if you miss three or more appointments without notifying the office, you may be dismissed from the clinic at the provider's discretion.      For prescription refill requests, have your pharmacy contact our office and allow 72 hours for refills to be completed.    Today you received the following chemotherapy and/or immunotherapy agents Paclitaxel      To help prevent nausea and vomiting after your treatment, we encourage you to take your nausea medication as directed.  BELOW ARE SYMPTOMS THAT SHOULD BE REPORTED IMMEDIATELY: *FEVER GREATER THAN 100.4 F (38 C) OR HIGHER *CHILLS OR SWEATING *NAUSEA AND VOMITING THAT IS NOT CONTROLLED WITH YOUR NAUSEA MEDICATION *UNUSUAL SHORTNESS OF BREATH *UNUSUAL BRUISING OR BLEEDING *URINARY PROBLEMS (pain or burning when urinating, or frequent urination) *BOWEL PROBLEMS (unusual diarrhea, constipation, pain near the anus) TENDERNESS IN MOUTH AND THROAT WITH OR WITHOUT PRESENCE OF ULCERS (sore throat, sores in mouth, or a toothache) UNUSUAL RASH, SWELLING OR PAIN  UNUSUAL VAGINAL DISCHARGE OR ITCHING   Items with * indicate a potential emergency and should be followed up as soon as possible or go to the Emergency Department if any problems should occur.  Please show the CHEMOTHERAPY ALERT CARD or IMMUNOTHERAPY ALERT CARD at  check-in to the Emergency Department and triage nurse.  Should you have questions after your visit or need to cancel or reschedule your appointment, please contact [facility]  Dept: [phone]  and follow the prompts.  Office hours are 8:00 a.m. to 4:30 p.m. Monday - Friday. Please note that voicemails left after 4:00 p.m. may not be returned until the following business day.  We are closed weekends and major holidays. You have access to a nurse at all times for urgent questions. Please call the main number to the clinic Dept: [phone] and follow the prompts.   For any non-urgent questions, you may also contact your provider using MyChart. We now offer e-Visits for anyone 18 and older to request care online for non-urgent symptoms. For details visit [email or web address].   Also download the MyChart app! Go to the app store, search "MyChart", open the app, select [place], and log in with your MyChart username and password.   

## 2022-09-11 NOTE — Progress Notes (Signed)
Per Mardella Layman NP, ok to treat with pulse 107 today.

## 2022-09-17 MED FILL — Dexamethasone Sodium Phosphate Inj 100 MG/10ML: INTRAMUSCULAR | Qty: 1 | Status: AC

## 2022-09-18 ENCOUNTER — Other Ambulatory Visit: Payer: Self-pay

## 2022-09-18 ENCOUNTER — Inpatient Hospital Stay (HOSPITAL_BASED_OUTPATIENT_CLINIC_OR_DEPARTMENT_OTHER): Payer: Managed Care, Other (non HMO) | Admitting: Hematology and Oncology

## 2022-09-18 ENCOUNTER — Inpatient Hospital Stay: Payer: Managed Care, Other (non HMO)

## 2022-09-18 VITALS — BP 119/90 | HR 106 | Temp 98.1°F | Resp 18

## 2022-09-18 VITALS — BP 143/77 | HR 109 | Temp 97.5°F | Resp 18 | Ht 67.0 in | Wt 331.3 lb

## 2022-09-18 DIAGNOSIS — Z5111 Encounter for antineoplastic chemotherapy: Secondary | ICD-10-CM | POA: Diagnosis not present

## 2022-09-18 DIAGNOSIS — C50812 Malignant neoplasm of overlapping sites of left female breast: Secondary | ICD-10-CM

## 2022-09-18 DIAGNOSIS — Z170 Estrogen receptor positive status [ER+]: Secondary | ICD-10-CM | POA: Diagnosis not present

## 2022-09-18 DIAGNOSIS — Z95828 Presence of other vascular implants and grafts: Secondary | ICD-10-CM

## 2022-09-18 LAB — CBC WITH DIFFERENTIAL (CANCER CENTER ONLY)
Abs Immature Granulocytes: 0.05 10*3/uL (ref 0.00–0.07)
Basophils Absolute: 0 10*3/uL (ref 0.0–0.1)
Basophils Relative: 1 %
Eosinophils Absolute: 0.1 10*3/uL (ref 0.0–0.5)
Eosinophils Relative: 2 %
HCT: 28.9 % — ABNORMAL LOW (ref 36.0–46.0)
Hemoglobin: 9.8 g/dL — ABNORMAL LOW (ref 12.0–15.0)
Immature Granulocytes: 1 %
Lymphocytes Relative: 16 %
Lymphs Abs: 0.8 10*3/uL (ref 0.7–4.0)
MCH: 29.4 pg (ref 26.0–34.0)
MCHC: 33.9 g/dL (ref 30.0–36.0)
MCV: 86.8 fL (ref 80.0–100.0)
Monocytes Absolute: 0.5 10*3/uL (ref 0.1–1.0)
Monocytes Relative: 11 %
Neutro Abs: 3.3 10*3/uL (ref 1.7–7.7)
Neutrophils Relative %: 69 %
Platelet Count: 233 10*3/uL (ref 150–400)
RBC: 3.33 MIL/uL — ABNORMAL LOW (ref 3.87–5.11)
RDW: 18.9 % — ABNORMAL HIGH (ref 11.5–15.5)
WBC Count: 4.8 10*3/uL (ref 4.0–10.5)
nRBC: 0 % (ref 0.0–0.2)

## 2022-09-18 LAB — CMP (CANCER CENTER ONLY)
ALT: 20 U/L (ref 0–44)
AST: 15 U/L (ref 15–41)
Albumin: 3.6 g/dL (ref 3.5–5.0)
Alkaline Phosphatase: 59 U/L (ref 38–126)
Anion gap: 7 (ref 5–15)
BUN: 18 mg/dL (ref 6–20)
CO2: 25 mmol/L (ref 22–32)
Calcium: 8.5 mg/dL — ABNORMAL LOW (ref 8.9–10.3)
Chloride: 103 mmol/L (ref 98–111)
Creatinine: 0.68 mg/dL (ref 0.44–1.00)
GFR, Estimated: >60 mL/min (ref >60)
Glucose, Bld: 268 mg/dL — ABNORMAL HIGH (ref 70–99)
Potassium: 4.2 mmol/L (ref 3.5–5.1)
Sodium: 135 mmol/L (ref 135–145)
Total Bilirubin: 0.6 mg/dL (ref 0.3–1.2)
Total Protein: 6.1 g/dL — ABNORMAL LOW (ref 6.5–8.1)

## 2022-09-18 MED ORDER — FAMOTIDINE IN NACL 20-0.9 MG/50ML-% IV SOLN
20.0000 mg | Freq: Once | INTRAVENOUS | Status: AC
  Administered 2022-09-18: 20 mg via INTRAVENOUS
  Filled 2022-09-18: qty 50

## 2022-09-18 MED ORDER — SODIUM CHLORIDE 0.9 % IV SOLN: Freq: Once | INTRAVENOUS | Status: AC

## 2022-09-18 MED ORDER — SODIUM CHLORIDE 0.9 % IV SOLN
10.0000 mg | Freq: Once | INTRAVENOUS | Status: AC
  Administered 2022-09-18: 10 mg via INTRAVENOUS
  Filled 2022-09-18: qty 10

## 2022-09-18 MED ORDER — DIPHENHYDRAMINE HCL 50 MG/ML IJ SOLN
25.0000 mg | Freq: Once | INTRAMUSCULAR | Status: AC
  Administered 2022-09-18: 25 mg via INTRAVENOUS
  Filled 2022-09-18: qty 1

## 2022-09-18 MED ORDER — PROCHLORPERAZINE EDISYLATE 10 MG/2ML IJ SOLN
10.0000 mg | Freq: Once | INTRAMUSCULAR | Status: AC
  Administered 2022-09-18: 10 mg via INTRAVENOUS
  Filled 2022-09-18: qty 2

## 2022-09-18 MED ORDER — SODIUM CHLORIDE 0.9% FLUSH
10.0000 mL | INTRAVENOUS | Status: DC | PRN
  Administered 2022-09-18: 10 mL

## 2022-09-18 MED ORDER — PROCHLORPERAZINE MALEATE 10 MG PO TABS
10.0000 mg | ORAL_TABLET | Freq: Four times a day (QID) | ORAL | 1 refills | Status: DC | PRN
Start: 2022-09-18 — End: 2023-03-04

## 2022-09-18 MED ORDER — HEPARIN SOD (PORK) LOCK FLUSH 100 UNIT/ML IV SOLN
500.0000 [IU] | Freq: Once | INTRAVENOUS | Status: AC | PRN
  Administered 2022-09-18: 500 [IU]

## 2022-09-18 MED ORDER — SODIUM CHLORIDE 0.9 % IV SOLN
65.0000 mg/m2 | Freq: Once | INTRAVENOUS | Status: AC
  Administered 2022-09-18: 168 mg via INTRAVENOUS
  Filled 2022-09-18: qty 28

## 2022-09-18 NOTE — Assessment & Plan Note (Addendum)
stage IIIB ER/PR positive breast cancer diagnosed in 06/2022 currently receiving neoadjuvant chemotherapy.     Treatment Plan:  1. Neoadjuvant chemotherapy with Adriamycin and Cytoxan dose dense 4 followed by Taxol weekly 12  2. Staging Scans with CT c/a/p and bone scan 3. Followed by breast conserving surgery with targeted axillary dissection 4. Followed by adjuvant radiation therapy 5.  Followed by antiestrogen therapy ____________________________________________________   Current treatment: Completed 4 cycles of Adriamycin and Cytoxan.  Today is cycle 6 Taxol  Chemo toxicities:  Fatigue: Slowly improving Treatment related anemia: mild, hemoglobin 9.8, stable Nausea: managed with compazine Mucositis: Magic Mouthwash  Hyperglycemia: She was able to get her Ozempic restarted. Headaches: Improved after stopping Zofran. IV fluids will be added to each of her treatments because of significant improvement in how she feels after the fluids were given.   Patient will come weekly for chemo on every other week for follow-up with [place].

## 2022-09-18 NOTE — Progress Notes (Signed)
Ok to treat today per Dr. Pamelia Hoit- heart rate 102-109 bpm.

## 2022-09-18 NOTE — Patient Instructions (Signed)
[facility]  Discharge Instructions: Thank you for choosing [facility] to provide your oncology and hematology care.   If you have a lab appointment with the Cancer Center, please go directly to the Cancer Center and check in at the registration area.   Wear comfortable clothing and clothing appropriate for easy access to any Portacath or PICC line.   We strive to give you quality time with your provider. You may need to reschedule your appointment if you arrive late (15 or more minutes).  Arriving late affects you and other patients whose appointments are after yours.  Also, if you miss three or more appointments without notifying the office, you may be dismissed from the clinic at the provider's discretion.      For prescription refill requests, have your pharmacy contact our office and allow 72 hours for refills to be completed.    Today you received the following chemotherapy and/or immunotherapy agents Paclitaxel      To help prevent nausea and vomiting after your treatment, we encourage you to take your nausea medication as directed.  BELOW ARE SYMPTOMS THAT SHOULD BE REPORTED IMMEDIATELY: *FEVER GREATER THAN 100.4 F (38 C) OR HIGHER *CHILLS OR SWEATING *NAUSEA AND VOMITING THAT IS NOT CONTROLLED WITH YOUR NAUSEA MEDICATION *UNUSUAL SHORTNESS OF BREATH *UNUSUAL BRUISING OR BLEEDING *URINARY PROBLEMS (pain or burning when urinating, or frequent urination) *BOWEL PROBLEMS (unusual diarrhea, constipation, pain near the anus) TENDERNESS IN MOUTH AND THROAT WITH OR WITHOUT PRESENCE OF ULCERS (sore throat, sores in mouth, or a toothache) UNUSUAL RASH, SWELLING OR PAIN  UNUSUAL VAGINAL DISCHARGE OR ITCHING   Items with * indicate a potential emergency and should be followed up as soon as possible or go to the Emergency Department if any problems should occur.  Please show the CHEMOTHERAPY ALERT CARD or IMMUNOTHERAPY ALERT CARD at  check-in to the Emergency Department and triage nurse.  Should you have questions after your visit or need to cancel or reschedule your appointment, please contact [facility]  Dept: [phone]  and follow the prompts.  Office hours are 8:00 a.m. to 4:30 p.m. Monday - Friday. Please note that voicemails left after 4:00 p.m. may not be returned until the following business day.  We are closed weekends and major holidays. You have access to a nurse at all times for urgent questions. Please call the main number to the clinic Dept: [phone] and follow the prompts.   For any non-urgent questions, you may also contact your provider using MyChart. We now offer e-Visits for anyone 18 and older to request care online for non-urgent symptoms. For details visit [email or web address].   Also download the MyChart app! Go to the app store, search "MyChart", open the app, select [place], and log in with your MyChart username and password.   

## 2022-09-19 ENCOUNTER — Telehealth: Payer: Self-pay | Admitting: Hematology and Oncology

## 2022-09-19 NOTE — Telephone Encounter (Signed)
Scheduled appointments per WQ. Left voicemail. 

## 2022-09-20 ENCOUNTER — Other Ambulatory Visit: Payer: Self-pay

## 2022-09-23 ENCOUNTER — Other Ambulatory Visit: Payer: Self-pay

## 2022-09-24 MED FILL — Dexamethasone Sodium Phosphate Inj 100 MG/10ML: INTRAMUSCULAR | Qty: 1 | Status: AC

## 2022-09-25 ENCOUNTER — Inpatient Hospital Stay: Payer: Managed Care, Other (non HMO)

## 2022-09-25 ENCOUNTER — Inpatient Hospital Stay: Payer: Managed Care, Other (non HMO) | Admitting: Hematology and Oncology

## 2022-09-25 ENCOUNTER — Other Ambulatory Visit: Payer: Self-pay

## 2022-09-25 VITALS — BP 133/84 | HR 100 | Temp 97.8°F | Resp 18 | Wt 332.8 lb

## 2022-09-25 DIAGNOSIS — C50812 Malignant neoplasm of overlapping sites of left female breast: Secondary | ICD-10-CM

## 2022-09-25 DIAGNOSIS — Z95828 Presence of other vascular implants and grafts: Secondary | ICD-10-CM

## 2022-09-25 DIAGNOSIS — Z5111 Encounter for antineoplastic chemotherapy: Secondary | ICD-10-CM | POA: Diagnosis not present

## 2022-09-25 LAB — CBC WITH DIFFERENTIAL (CANCER CENTER ONLY)
Abs Immature Granulocytes: 0.05 10*3/uL (ref 0.00–0.07)
Basophils Absolute: 0.1 10*3/uL (ref 0.0–0.1)
Basophils Relative: 1 %
Eosinophils Absolute: 0.1 10*3/uL (ref 0.0–0.5)
Eosinophils Relative: 1 %
HCT: 30.2 % — ABNORMAL LOW (ref 36.0–46.0)
Hemoglobin: 10.1 g/dL — ABNORMAL LOW (ref 12.0–15.0)
Immature Granulocytes: 1 %
Lymphocytes Relative: 13 %
Lymphs Abs: 0.7 10*3/uL (ref 0.7–4.0)
MCH: 29.6 pg (ref 26.0–34.0)
MCHC: 33.4 g/dL (ref 30.0–36.0)
MCV: 88.6 fL (ref 80.0–100.0)
Monocytes Absolute: 0.5 10*3/uL (ref 0.1–1.0)
Monocytes Relative: 9 %
Neutro Abs: 4.1 10*3/uL (ref 1.7–7.7)
Neutrophils Relative %: 75 %
Platelet Count: 251 10*3/uL (ref 150–400)
RBC: 3.41 MIL/uL — ABNORMAL LOW (ref 3.87–5.11)
RDW: 18.2 % — ABNORMAL HIGH (ref 11.5–15.5)
WBC Count: 5.4 10*3/uL (ref 4.0–10.5)
nRBC: 0 % (ref 0.0–0.2)

## 2022-09-25 LAB — CMP (CANCER CENTER ONLY)
ALT: 22 U/L (ref 0–44)
AST: 18 U/L (ref 15–41)
Albumin: 3.6 g/dL (ref 3.5–5.0)
Alkaline Phosphatase: 62 U/L (ref 38–126)
Anion gap: 6 (ref 5–15)
BUN: 13 mg/dL (ref 6–20)
CO2: 27 mmol/L (ref 22–32)
Calcium: 8.8 mg/dL — ABNORMAL LOW (ref 8.9–10.3)
Chloride: 103 mmol/L (ref 98–111)
Creatinine: 0.71 mg/dL (ref 0.44–1.00)
GFR, Estimated: >60 mL/min (ref >60)
Glucose, Bld: 206 mg/dL — ABNORMAL HIGH (ref 70–99)
Potassium: 4.2 mmol/L (ref 3.5–5.1)
Sodium: 136 mmol/L (ref 135–145)
Total Bilirubin: 0.5 mg/dL (ref 0.3–1.2)
Total Protein: 6.8 g/dL (ref 6.5–8.1)

## 2022-09-25 MED ORDER — SODIUM CHLORIDE 0.9 % IV SOLN
65.0000 mg/m2 | Freq: Once | INTRAVENOUS | Status: AC
  Administered 2022-09-25: 168 mg via INTRAVENOUS
  Filled 2022-09-25: qty 28

## 2022-09-25 MED ORDER — PROCHLORPERAZINE EDISYLATE 10 MG/2ML IJ SOLN
10.0000 mg | Freq: Once | INTRAMUSCULAR | Status: AC
  Administered 2022-09-25: 10 mg via INTRAVENOUS
  Filled 2022-09-25: qty 2

## 2022-09-25 MED ORDER — SODIUM CHLORIDE 0.9 % IV SOLN
10.0000 mg | Freq: Once | INTRAVENOUS | Status: AC
  Administered 2022-09-25: 10 mg via INTRAVENOUS
  Filled 2022-09-25: qty 10

## 2022-09-25 MED ORDER — SODIUM CHLORIDE 0.9% FLUSH
10.0000 mL | INTRAVENOUS | Status: DC | PRN
  Administered 2022-09-25: 10 mL

## 2022-09-25 MED ORDER — SODIUM CHLORIDE 0.9 % IV SOLN: Freq: Once | INTRAVENOUS | Status: AC

## 2022-09-25 MED ORDER — CETIRIZINE HCL 10 MG/ML IV SOLN
10.0000 mg | Freq: Once | INTRAVENOUS | Status: AC
  Administered 2022-09-25: 10 mg via INTRAVENOUS
  Filled 2022-09-25: qty 1

## 2022-09-25 MED ORDER — FAMOTIDINE IN NACL 20-0.9 MG/50ML-% IV SOLN
20.0000 mg | Freq: Once | INTRAVENOUS | Status: AC
  Administered 2022-09-25: 20 mg via INTRAVENOUS
  Filled 2022-09-25: qty 50

## 2022-09-25 MED ORDER — HEPARIN SOD (PORK) LOCK FLUSH 100 UNIT/ML IV SOLN
500.0000 [IU] | Freq: Once | INTRAVENOUS | Status: AC | PRN
  Administered 2022-09-25: 500 [IU]

## 2022-09-25 NOTE — Patient Instructions (Signed)
[facility]  Discharge Instructions: Thank you for choosing [facility] to provide your oncology and hematology care.   If you have a lab appointment with the Cancer Center, please go directly to the Cancer Center and check in at the registration area.   Wear comfortable clothing and clothing appropriate for easy access to any Portacath or PICC line.   We strive to give you quality time with your provider. You may need to reschedule your appointment if you arrive late (15 or more minutes).  Arriving late affects you and other patients whose appointments are after yours.  Also, if you miss three or more appointments without notifying the office, you may be dismissed from the clinic at the provider's discretion.      For prescription refill requests, have your pharmacy contact our office and allow 72 hours for refills to be completed.    Today you received the following chemotherapy and/or immunotherapy agents Paclitaxel      To help prevent nausea and vomiting after your treatment, we encourage you to take your nausea medication as directed.  BELOW ARE SYMPTOMS THAT SHOULD BE REPORTED IMMEDIATELY: *FEVER GREATER THAN 100.4 F (38 C) OR HIGHER *CHILLS OR SWEATING *NAUSEA AND VOMITING THAT IS NOT CONTROLLED WITH YOUR NAUSEA MEDICATION *UNUSUAL SHORTNESS OF BREATH *UNUSUAL BRUISING OR BLEEDING *URINARY PROBLEMS (pain or burning when urinating, or frequent urination) *BOWEL PROBLEMS (unusual diarrhea, constipation, pain near the anus) TENDERNESS IN MOUTH AND THROAT WITH OR WITHOUT PRESENCE OF ULCERS (sore throat, sores in mouth, or a toothache) UNUSUAL RASH, SWELLING OR PAIN  UNUSUAL VAGINAL DISCHARGE OR ITCHING   Items with * indicate a potential emergency and should be followed up as soon as possible or go to the Emergency Department if any problems should occur.  Please show the CHEMOTHERAPY ALERT CARD or IMMUNOTHERAPY ALERT CARD at  check-in to the Emergency Department and triage nurse.  Should you have questions after your visit or need to cancel or reschedule your appointment, please contact [facility]  Dept: [phone]  and follow the prompts.  Office hours are 8:00 a.m. to 4:30 p.m. Monday - Friday. Please note that voicemails left after 4:00 p.m. may not be returned until the following business day.  We are closed weekends and major holidays. You have access to a nurse at all times for urgent questions. Please call the main number to the clinic Dept: [phone] and follow the prompts.   For any non-urgent questions, you may also contact your provider using MyChart. We now offer e-Visits for anyone 18 and older to request care online for non-urgent symptoms. For details visit [email or web address].   Also download the MyChart app! Go to the app store, search "MyChart", open the app, select [place], and log in with your MyChart username and password.   

## 2022-09-27 ENCOUNTER — Other Ambulatory Visit: Payer: Self-pay

## 2022-09-29 NOTE — Progress Notes (Signed)
Patient Care Team: Gerre Scull, NP as PCP - General (Internal Medicine) Gweneth Dimitri, MD as Consulting Physician (Family Medicine) Gweneth Dimitri, MD as Consulting Physician (Family Medicine) Serena Croissant, MD as Consulting Physician (Hematology and Oncology) Emelia Loron, MD as Consulting Physician (General Surgery) Dorothy Puffer, MD as Consulting Physician (Radiation Oncology) Donnelly Angelica, RN as Oncology Nurse Navigator Pershing Proud, RN as Oncology Nurse Navigator  DIAGNOSIS:  Encounter Diagnosis  Name Primary?   Malignant neoplasm of overlapping sites of left breast in female, estrogen receptor positive (HCC) Yes    SUMMARY OF ONCOLOGIC HISTORY: Oncology History  Malignant neoplasm of overlapping sites of left breast in female, estrogen receptor positive (HCC)  05/29/2022 Initial Diagnosis   Palpable abnormalities in the left breast led to mammograms which revealed 3 masses 0.7 cm at 1 o'clock position, 3 cm at 2 o'clock position, additional nodule 8 mm total span 5 cm, 5 abnormal axillary lymph nodes biopsy of the masses and the lymph node were positive for grade 3 IDC with high-grade DCIS with LVI, ER 95%, PR 40%, Ki-67 60%, HER2 2+ by IHC FISH negative   06/15/2022 Cancer Staging   Staging form: Breast, AJCC 8th Edition - Clinical: Stage IIIB (cT3, cN3, cM0, G3, ER+, PR+, HER2-) - Signed by Loa Socks, NP on 06/15/2022 Stage prefix: Initial diagnosis Histologic grading system: 3 grade system   06/15/2022 Breast MRI   IMPRESSION: 1. Multiple sites of biopsy proven malignancy with in the lateral left breast. The overall conglomerate of suspicious findings spans approximately 4.4 x 3.5 x 3.5 cm, and contains all 3 post biopsy marking clips. 2. 10+ morphologically abnormal level I, II, III and Rotter's lymph nodes within the left axilla. 3. Diffuse skin and trabecular thickening on the left, consistent with lymphovascular spread of disease. 4.  No MRI evidence of malignancy on the right.   06/20/2022 -  Chemotherapy   Patient is on Treatment Plan : BREAST ADJUVANT DOSE DENSE AC q14d / PACLitaxel q7d      Genetic Testing   [facility] Multi-Cancer Panel+RNA was Negative. Report date is 06/13/2022.  The Multi-Cancer + RNA Panel offered by [employer] includes sequencing and/or deletion/duplication analysis of the following 70 genes:  AIP*, ALK, APC*, ATM*, AXIN2*, BAP1*, BARD1*, BLM*, BMPR1A*, BRCA1*, BRCA2*, BRIP1*, CDC73*, CDH1*, CDK4, CDKN1B*, CDKN2A, CHEK2*, CTNNA1*, DICER1*, EPCAM (del/dup only), EGFR, FH*, FLCN*, GREM1 (promoter dup only), HOXB13, KIT, LZTR1, MAX*, MBD4, MEN1*, MET, MITF, MLH1*, MSH2*, MSH3*, MSH6*, MUTYH*, NF1*, NF2*, NTHL1*, PALB2*, PDGFRA, PMS2*, POLD1*, POLE*, POT1*, PRKAR1A*, PTCH1*, PTEN*, RAD51C*, RAD51D*, RB1*, RET, SDHA* (sequencing only), SDHAF2*, SDHB*, SDHC*, SDHD*, SMAD4*, SMARCA4*, SMARCB1*, SMARCE1*, STK11*, SUFU*, TMEM127*, TP53*, TSC1*, TSC2*, VHL*. RNA analysis is performed for * genes.     CHIEF COMPLIANT: Cycle 8 Taxol   INTERVAL HISTORY: Lynn [name] is a 37 year old above-mentioned history of breast cancer is currently on chemotherapy with a dose dense Adriamycin and Cytoxan today is cycle 5 and follow-up. Pt report numbness in toes. Some in the left finger tips. Denies any in the right. States that Hr has been staying over 100.   ALLERGIES:  is allergic to macrobid [nitrofurantoin macrocrystal] and ibuprofen.  MEDICATIONS:  Current Outpatient Medications  Medication Sig Dispense Refill   acetaminophen (TYLENOL) 500 MG tablet Take 2 tablets (1,000 mg total) by mouth every 6 (six) hours as needed for moderate pain or headache. 30 tablet 0   ALPRAZolam (XANAX) 0.5 MG tablet Take 1 tablet (0.5 mg total) by mouth at bedtime  as needed for anxiety. 30 tablet 1   aspirin EC 325 MG tablet Take 325 mg by mouth in the morning.     aspirin-acetaminophen-caffeine (EXCEDRIN MIGRAINE) 250-250-65 MG tablet Take  1-2 tablets by mouth 2 (two) times daily as needed for headache or migraine.     Blood Glucose Monitoring Suppl (ACCU-CHEK AVIVA PLUS) w/Device KIT 1 each by Does not apply route daily at 6 (six) AM. 1 kit 0   carvedilol (COREG) 25 MG tablet Take 1 tablet (25 mg total) by mouth 2 (two) times daily with a meal. 180 tablet 1   cyclobenzaprine (FLEXERIL) 10 MG tablet Take 1 tablet (10 mg total) by mouth 3 (three) times daily as needed for muscle spasms. 30 tablet 1   famotidine (PEPCID) 20 MG tablet TAKE 1 TABLET(20 MG) BY MOUTH TWICE DAILY 180 tablet 0   glucose blood (ACCU-CHEK AVIVA PLUS) test strip Use as instructed 100 each 12   lidocaine (XYLOCAINE) 2 % jelly Apply 1 Application topically as needed. 85 g 0   lidocaine-prilocaine (EMLA) cream Apply to affected area once 30 g 3   magic mouthwash (lidocaine, diphenhydrAMINE, alum & mag hydroxide) suspension Take 5 mLs by mouth 3 (three) times daily as needed for mouth pain. 120 mL 0   ondansetron (ZOFRAN) 8 MG tablet Take 1 tab (8 mg) by mouth every 8 hrs as needed for nausea/vomiting. Start third day after doxorubicin/cyclophosphamide chemotherapy. 30 tablet 1   ONE TOUCH CLUB LANCETS MISC 1 each by Does not apply route daily. 100 each 11   prochlorperazine (COMPAZINE) 10 MG tablet Take 1 tablet (10 mg total) by mouth every 6 (six) hours as needed for nausea or vomiting. 30 tablet 1   Semaglutide,0.25 or 0.5MG /DOS, (OZEMPIC, 0.25 OR 0.5 MG/DOSE,) 2 MG/3ML SOPN Inject 0.25 mg into the skin once a week. 3 mL 3   No current facility-administered medications for this visit.    PHYSICAL EXAMINATION: ECOG PERFORMANCE STATUS: 1 - Symptomatic but completely ambulatory  Vitals:   10/02/22 0827  BP: (!) 148/85  Pulse: (!) 109  Resp: 18  Temp: (!) 97.5 F (36.4 C)  SpO2: 96%   Filed Weights   10/02/22 0827  Weight: (!) 330 lb 12.8 oz (150 kg)     LABORATORY DATA:  I have reviewed the data as listed    Latest Ref Rng & Units 10/02/2022     8:14 AM 09/25/2022    8:02 AM 09/18/2022    8:24 AM  CMP  Glucose 70 - 99 mg/dL 409  811  914   BUN 6 - 20 mg/dL 10  13  18    Creatinine 0.44 - 1.00 mg/dL 7.82  9.56  2.13   Sodium 135 - 145 mmol/L 136  136  135   Potassium 3.5 - 5.1 mmol/L 4.2  4.2  4.2   Chloride 98 - 111 mmol/L 105  103  103   CO2 22 - 32 mmol/L 25  27  25    Calcium 8.9 - 10.3 mg/dL 8.4  8.8  8.5   Total Protein 6.5 - 8.1 g/dL 6.2  6.8  6.1   Total Bilirubin 0.3 - 1.2 mg/dL 0.6  0.5  0.6   Alkaline Phos 38 - 126 U/L 59  62  59   AST 15 - 41 U/L 19  18  15    ALT 0 - 44 U/L 28  22  20      Lab Results  Component Value Date   WBC  5.0 10/02/2022   HGB 10.5 (L) 10/02/2022   HCT 30.8 (L) 10/02/2022   MCV 88.8 10/02/2022   PLT 272 10/02/2022   NEUTROABS 3.5 10/02/2022    ASSESSMENT & PLAN:  Malignant neoplasm of overlapping sites of left breast in female, 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   06/27/2022: CT CAP: Left breast cancer, 2 left axillary lymph nodes, left adnexal cystic lesion 4 cm   Treatment Plan:  1. Neoadjuvant chemotherapy with Adriamycin and Cytoxan dose dense 4 followed by Taxol weekly 12  2. Followed by breast conserving surgery with targeted axillary dissection 3. Followed by adjuvant radiation therapy 4.  Followed by antiestrogen therapy ____________________________________________________   Current treatment: Completed 4 cycles of Adriamycin and Cytoxan.  Today is cycle 8 Taxol  Chemo toxicities:  Fatigue: Slowly improving Treatment related anemia: mild, hemoglobin 9.8, stable Nausea: managed with compazine Mucositis: Magic Mouthwash  Hyperglycemia: She was able to get her Ozempic restarted. Headaches: Improved after stopping Zofran. IV fluids will be added to each of her treatments because of significant improvement in how she feels after the fluids were given. Peripheral neuropathy:  She has noticed it slightly in the left hand as well has her toes.  We are still watching and monitoring.   Patient will come weekly for chemo on every other week for follow-up with [place].    No orders of the defined types were placed in this encounter.  The patient has a good understanding of the overall plan. she agrees with it. she will call with any problems that may develop before the next visit here. Total time spent: 30 mins including face to face time and time spent for planning, charting and co-ordination of care   Tamsen Meek, MD 10/02/22    I Janan Ridge am acting as a Neurosurgeon for [employer]  I have reviewed the above documentation for accuracy and completeness, and I agree with the above.

## 2022-10-01 ENCOUNTER — Encounter: Payer: Self-pay | Admitting: *Deleted

## 2022-10-01 MED FILL — Dexamethasone Sodium Phosphate Inj 100 MG/10ML: INTRAMUSCULAR | Qty: 1 | Status: AC

## 2022-10-02 ENCOUNTER — Inpatient Hospital Stay (HOSPITAL_BASED_OUTPATIENT_CLINIC_OR_DEPARTMENT_OTHER): Payer: Managed Care, Other (non HMO) | Admitting: Hematology and Oncology

## 2022-10-02 ENCOUNTER — Inpatient Hospital Stay: Payer: Managed Care, Other (non HMO) | Attending: Hematology and Oncology

## 2022-10-02 ENCOUNTER — Inpatient Hospital Stay: Payer: Managed Care, Other (non HMO)

## 2022-10-02 ENCOUNTER — Other Ambulatory Visit: Payer: Self-pay

## 2022-10-02 VITALS — BP 148/85 | HR 109 | Temp 97.5°F | Resp 18 | Ht 67.0 in | Wt 330.8 lb

## 2022-10-02 DIAGNOSIS — C50812 Malignant neoplasm of overlapping sites of left female breast: Secondary | ICD-10-CM | POA: Insufficient documentation

## 2022-10-02 DIAGNOSIS — K1230 Oral mucositis (ulcerative), unspecified: Secondary | ICD-10-CM | POA: Insufficient documentation

## 2022-10-02 DIAGNOSIS — Z79899 Other long term (current) drug therapy: Secondary | ICD-10-CM | POA: Insufficient documentation

## 2022-10-02 DIAGNOSIS — R5383 Other fatigue: Secondary | ICD-10-CM | POA: Diagnosis not present

## 2022-10-02 DIAGNOSIS — R110 Nausea: Secondary | ICD-10-CM | POA: Diagnosis not present

## 2022-10-02 DIAGNOSIS — D649 Anemia, unspecified: Secondary | ICD-10-CM | POA: Insufficient documentation

## 2022-10-02 DIAGNOSIS — Z5111 Encounter for antineoplastic chemotherapy: Secondary | ICD-10-CM | POA: Diagnosis not present

## 2022-10-02 DIAGNOSIS — N6002 Solitary cyst of left breast: Secondary | ICD-10-CM | POA: Diagnosis not present

## 2022-10-02 DIAGNOSIS — Z170 Estrogen receptor positive status [ER+]: Secondary | ICD-10-CM

## 2022-10-02 DIAGNOSIS — Z9221 Personal history of antineoplastic chemotherapy: Secondary | ICD-10-CM | POA: Insufficient documentation

## 2022-10-02 DIAGNOSIS — G629 Polyneuropathy, unspecified: Secondary | ICD-10-CM | POA: Insufficient documentation

## 2022-10-02 DIAGNOSIS — R739 Hyperglycemia, unspecified: Secondary | ICD-10-CM | POA: Insufficient documentation

## 2022-10-02 LAB — CBC WITH DIFFERENTIAL (CANCER CENTER ONLY)
Abs Immature Granulocytes: 0.04 10*3/uL (ref 0.00–0.07)
Basophils Absolute: 0 10*3/uL (ref 0.0–0.1)
Basophils Relative: 1 %
Eosinophils Absolute: 0.1 10*3/uL (ref 0.0–0.5)
Eosinophils Relative: 1 %
HCT: 30.8 % — ABNORMAL LOW (ref 36.0–46.0)
Hemoglobin: 10.5 g/dL — ABNORMAL LOW (ref 12.0–15.0)
Immature Granulocytes: 1 %
Lymphocytes Relative: 16 %
Lymphs Abs: 0.8 10*3/uL (ref 0.7–4.0)
MCH: 30.3 pg (ref 26.0–34.0)
MCHC: 34.1 g/dL (ref 30.0–36.0)
MCV: 88.8 fL (ref 80.0–100.0)
Monocytes Absolute: 0.5 10*3/uL (ref 0.1–1.0)
Monocytes Relative: 11 %
Neutro Abs: 3.5 10*3/uL (ref 1.7–7.7)
Neutrophils Relative %: 70 %
Platelet Count: 272 10*3/uL (ref 150–400)
RBC: 3.47 MIL/uL — ABNORMAL LOW (ref 3.87–5.11)
RDW: 17.2 % — ABNORMAL HIGH (ref 11.5–15.5)
WBC Count: 5 10*3/uL (ref 4.0–10.5)
nRBC: 0 % (ref 0.0–0.2)

## 2022-10-02 LAB — CMP (CANCER CENTER ONLY)
ALT: 28 U/L (ref 0–44)
AST: 19 U/L (ref 15–41)
Albumin: 3.7 g/dL (ref 3.5–5.0)
Alkaline Phosphatase: 59 U/L (ref 38–126)
Anion gap: 6 (ref 5–15)
BUN: 10 mg/dL (ref 6–20)
CO2: 25 mmol/L (ref 22–32)
Calcium: 8.4 mg/dL — ABNORMAL LOW (ref 8.9–10.3)
Chloride: 105 mmol/L (ref 98–111)
Creatinine: 0.71 mg/dL (ref 0.44–1.00)
GFR, Estimated: >60 mL/min (ref >60)
Glucose, Bld: 191 mg/dL — ABNORMAL HIGH (ref 70–99)
Potassium: 4.2 mmol/L (ref 3.5–5.1)
Sodium: 136 mmol/L (ref 135–145)
Total Bilirubin: 0.6 mg/dL (ref 0.3–1.2)
Total Protein: 6.2 g/dL — ABNORMAL LOW (ref 6.5–8.1)

## 2022-10-02 LAB — PREGNANCY, URINE: Preg Test, Ur: NEGATIVE

## 2022-10-02 MED ORDER — CETIRIZINE HCL 10 MG/ML IV SOLN
10.0000 mg | Freq: Once | INTRAVENOUS | Status: AC
  Administered 2022-10-02: 10 mg via INTRAVENOUS
  Filled 2022-10-02: qty 1

## 2022-10-02 MED ORDER — SODIUM CHLORIDE 0.9 % IV SOLN: Freq: Once | INTRAVENOUS | Status: AC

## 2022-10-02 MED ORDER — SODIUM CHLORIDE 0.9 % IV SOLN
65.0000 mg/m2 | Freq: Once | INTRAVENOUS | Status: AC
  Administered 2022-10-02: 168 mg via INTRAVENOUS
  Filled 2022-10-02: qty 28

## 2022-10-02 MED ORDER — FAMOTIDINE IN NACL 20-0.9 MG/50ML-% IV SOLN
20.0000 mg | Freq: Once | INTRAVENOUS | Status: AC
  Administered 2022-10-02: 20 mg via INTRAVENOUS
  Filled 2022-10-02: qty 50

## 2022-10-02 MED ORDER — HEPARIN SOD (PORK) LOCK FLUSH 100 UNIT/ML IV SOLN
500.0000 [IU] | Freq: Once | INTRAVENOUS | Status: AC | PRN
  Administered 2022-10-02: 500 [IU]

## 2022-10-02 MED ORDER — SODIUM CHLORIDE 0.9 % IV SOLN
10.0000 mg | Freq: Once | INTRAVENOUS | Status: AC
  Administered 2022-10-02: 10 mg via INTRAVENOUS
  Filled 2022-10-02: qty 10

## 2022-10-02 MED ORDER — PROCHLORPERAZINE EDISYLATE 10 MG/2ML IJ SOLN
10.0000 mg | Freq: Once | INTRAMUSCULAR | Status: AC
  Administered 2022-10-02: 10 mg via INTRAVENOUS
  Filled 2022-10-02: qty 2

## 2022-10-02 MED ORDER — SODIUM CHLORIDE 0.9% FLUSH
10.0000 mL | INTRAVENOUS | Status: DC | PRN
  Administered 2022-10-02: 10 mL

## 2022-10-02 NOTE — Patient Instructions (Signed)
[name]  Discharge Instructions: Thank you for choosing [facility] to provide your oncology and hematology care.   If you have a lab appointment with the Cancer Center, please go directly to the Cancer Center and check in at the registration area.   Wear comfortable clothing and clothing appropriate for easy access to any Portacath or PICC line.   We strive to give you quality time with your provider. You may need to reschedule your appointment if you arrive late (15 or more minutes).  Arriving late affects you and other patients whose appointments are after yours.  Also, if you miss three or more appointments without notifying the office, you may be dismissed from the clinic at the provider's discretion.      For prescription refill requests, have your pharmacy contact our office and allow 72 hours for refills to be completed.    Today you received the following chemotherapy and/or immunotherapy agents Paclitaxel      To help prevent nausea and vomiting after your treatment, we encourage you to take your nausea medication as directed.  BELOW ARE SYMPTOMS THAT SHOULD BE REPORTED IMMEDIATELY: *FEVER GREATER THAN 100.4 F (38 C) OR HIGHER *CHILLS OR SWEATING *NAUSEA AND VOMITING THAT IS NOT CONTROLLED WITH YOUR NAUSEA MEDICATION *UNUSUAL SHORTNESS OF BREATH *UNUSUAL BRUISING OR BLEEDING *URINARY PROBLEMS (pain or burning when urinating, or frequent urination) *BOWEL PROBLEMS (unusual diarrhea, constipation, pain near the anus) TENDERNESS IN MOUTH AND THROAT WITH OR WITHOUT PRESENCE OF ULCERS (sore throat, sores in mouth, or a toothache) UNUSUAL RASH, SWELLING OR PAIN  UNUSUAL VAGINAL DISCHARGE OR ITCHING   Items with * indicate a potential emergency and should be followed up as soon as possible or go to the Emergency Department if any problems should occur.  Please show the CHEMOTHERAPY ALERT CARD or IMMUNOTHERAPY ALERT CARD at  check-in to the Emergency Department and triage nurse.  Should you have questions after your visit or need to cancel or reschedule your appointment, please contact [facility]  Dept: [phone]  and follow the prompts.  Office hours are 8:00 a.m. to 4:30 p.m. Monday - Friday. Please note that voicemails left after 4:00 p.m. may not be returned until the following business day.  We are closed weekends and major holidays. You have access to a nurse at all times for urgent questions. Please call the main number to the clinic Dept: [phone] and follow the prompts.   For any non-urgent questions, you may also contact your provider using MyChart. We now offer e-Visits for anyone 18 and older to request care online for non-urgent symptoms. For details visit [email or web address].   Also download the MyChart app! Go to the app store, search "MyChart", open the app, select [facility], and log in with your MyChart username and password.   

## 2022-10-02 NOTE — Assessment & Plan Note (Signed)
stage IIIB ER/PR positive breast cancer diagnosed in 06/2022 currently receiving neoadjuvant chemotherapy.   06/27/2022: CT CAP: Left breast cancer, 2 left axillary lymph nodes, left adnexal cystic lesion 4 cm   Treatment Plan:  1. Neoadjuvant chemotherapy with Adriamycin and Cytoxan dose dense 4 followed by Taxol weekly 12  2. Followed by breast conserving surgery with targeted axillary dissection 3. Followed by adjuvant radiation therapy 4.  Followed by antiestrogen therapy ____________________________________________________   Current treatment: Completed 4 cycles of Adriamycin and Cytoxan.  Today is cycle 8 Taxol  Chemo toxicities:  Fatigue: Slowly improving Treatment related anemia: mild, hemoglobin 9.8, stable Nausea: managed with compazine Mucositis: Magic Mouthwash  Hyperglycemia: She was able to get her Ozempic restarted. Headaches: Improved after stopping Zofran. IV fluids will be added to each of her treatments because of significant improvement in how she feels after the fluids were given. Peripheral neuropathy: Grade 1: Monitoring   Patient will come weekly for chemo on every other week for follow-up with [place].

## 2022-10-08 MED FILL — Dexamethasone Sodium Phosphate Inj 100 MG/10ML: INTRAMUSCULAR | Qty: 1 | Status: AC

## 2022-10-09 ENCOUNTER — Inpatient Hospital Stay: Payer: Managed Care, Other (non HMO)

## 2022-10-09 ENCOUNTER — Other Ambulatory Visit: Payer: Self-pay | Admitting: Hematology and Oncology

## 2022-10-09 VITALS — BP 120/82 | HR 105 | Temp 98.2°F | Resp 17 | Wt 327.5 lb

## 2022-10-09 DIAGNOSIS — Z170 Estrogen receptor positive status [ER+]: Secondary | ICD-10-CM

## 2022-10-09 DIAGNOSIS — C50812 Malignant neoplasm of overlapping sites of left female breast: Secondary | ICD-10-CM | POA: Diagnosis not present

## 2022-10-09 DIAGNOSIS — Z95828 Presence of other vascular implants and grafts: Secondary | ICD-10-CM

## 2022-10-09 LAB — CBC WITH DIFFERENTIAL (CANCER CENTER ONLY)
Abs Immature Granulocytes: 0.02 10*3/uL (ref 0.00–0.07)
Basophils Absolute: 0 10*3/uL (ref 0.0–0.1)
Basophils Relative: 1 %
Eosinophils Absolute: 0 10*3/uL (ref 0.0–0.5)
Eosinophils Relative: 1 %
HCT: 32.3 % — ABNORMAL LOW (ref 36.0–46.0)
Hemoglobin: 11 g/dL — ABNORMAL LOW (ref 12.0–15.0)
Immature Granulocytes: 0 %
Lymphocytes Relative: 19 %
Lymphs Abs: 0.9 10*3/uL (ref 0.7–4.0)
MCH: 29.6 pg (ref 26.0–34.0)
MCHC: 34.1 g/dL (ref 30.0–36.0)
MCV: 86.8 fL (ref 80.0–100.0)
Monocytes Absolute: 0.5 10*3/uL (ref 0.1–1.0)
Monocytes Relative: 10 %
Neutro Abs: 3.2 10*3/uL (ref 1.7–7.7)
Neutrophils Relative %: 69 %
Platelet Count: 266 10*3/uL (ref 150–400)
RBC: 3.72 MIL/uL — ABNORMAL LOW (ref 3.87–5.11)
RDW: 16.1 % — ABNORMAL HIGH (ref 11.5–15.5)
WBC Count: 4.6 10*3/uL (ref 4.0–10.5)
nRBC: 0 % (ref 0.0–0.2)

## 2022-10-09 LAB — CMP (CANCER CENTER ONLY)
ALT: 27 U/L (ref 0–44)
AST: 21 U/L (ref 15–41)
Albumin: 3.8 g/dL (ref 3.5–5.0)
Alkaline Phosphatase: 62 U/L (ref 38–126)
Anion gap: 6 (ref 5–15)
BUN: 13 mg/dL (ref 6–20)
CO2: 26 mmol/L (ref 22–32)
Calcium: 8.8 mg/dL — ABNORMAL LOW (ref 8.9–10.3)
Chloride: 103 mmol/L (ref 98–111)
Creatinine: 0.72 mg/dL (ref 0.44–1.00)
GFR, Estimated: >60 mL/min (ref >60)
Glucose, Bld: 154 mg/dL — ABNORMAL HIGH (ref 70–99)
Potassium: 4.3 mmol/L (ref 3.5–5.1)
Sodium: 135 mmol/L (ref 135–145)
Total Bilirubin: 0.6 mg/dL (ref 0.3–1.2)
Total Protein: 7 g/dL (ref 6.5–8.1)

## 2022-10-09 MED ORDER — SODIUM CHLORIDE 0.9% FLUSH
10.0000 mL | INTRAVENOUS | Status: DC | PRN
  Administered 2022-10-09: 10 mL

## 2022-10-09 MED ORDER — SODIUM CHLORIDE 0.9 % IV SOLN: Freq: Once | INTRAVENOUS | Status: AC

## 2022-10-09 MED ORDER — CETIRIZINE HCL 10 MG/ML IV SOLN
10.0000 mg | Freq: Once | INTRAVENOUS | Status: AC
  Administered 2022-10-09: 10 mg via INTRAVENOUS
  Filled 2022-10-09: qty 1

## 2022-10-09 MED ORDER — FAMOTIDINE IN NACL 20-0.9 MG/50ML-% IV SOLN
20.0000 mg | Freq: Once | INTRAVENOUS | Status: AC
  Administered 2022-10-09: 20 mg via INTRAVENOUS
  Filled 2022-10-09: qty 50

## 2022-10-09 MED ORDER — HEPARIN SOD (PORK) LOCK FLUSH 100 UNIT/ML IV SOLN
500.0000 [IU] | Freq: Once | INTRAVENOUS | Status: AC | PRN
  Administered 2022-10-09: 500 [IU]

## 2022-10-09 MED ORDER — SODIUM CHLORIDE 0.9 % IV SOLN
10.0000 mg | Freq: Once | INTRAVENOUS | Status: AC
  Administered 2022-10-09: 10 mg via INTRAVENOUS
  Filled 2022-10-09: qty 10

## 2022-10-09 MED ORDER — SODIUM CHLORIDE 0.9 % IV SOLN
45.0000 mg/m2 | Freq: Once | INTRAVENOUS | Status: AC
  Administered 2022-10-09: 120 mg via INTRAVENOUS
  Filled 2022-10-09: qty 20

## 2022-10-09 MED ORDER — PROCHLORPERAZINE EDISYLATE 10 MG/2ML IJ SOLN
10.0000 mg | Freq: Once | INTRAMUSCULAR | Status: AC
  Administered 2022-10-09: 10 mg via INTRAVENOUS
  Filled 2022-10-09: qty 2

## 2022-10-09 NOTE — Progress Notes (Signed)
Okay to treat with elevated pulse per Dr. Pamelia Hoit. Dose reduction related increase in peripheral neuropathy.

## 2022-10-09 NOTE — Progress Notes (Signed)
Lowering the dosage of Taxol to 45 mg/m because of peripheral neuropathy

## 2022-10-13 NOTE — Progress Notes (Signed)
Patient Care Team: Gerre Scull, NP as PCP - General (Internal Medicine) Gweneth Dimitri, MD as Consulting Physician (Family Medicine) Gweneth Dimitri, MD as Consulting Physician (Family Medicine) Serena Croissant, MD as Consulting Physician (Hematology and Oncology) Emelia Loron, MD as Consulting Physician (General Surgery) Dorothy Puffer, MD as Consulting Physician (Radiation Oncology) Donnelly Angelica, RN as Oncology Nurse Navigator Pershing Proud, RN as Oncology Nurse Navigator  DIAGNOSIS:  Encounter Diagnosis  Name Primary?   Malignant neoplasm of overlapping sites of left breast in female, estrogen receptor positive (HCC) Yes    SUMMARY OF ONCOLOGIC HISTORY: Oncology History  Malignant neoplasm of overlapping sites of left breast in female, estrogen receptor positive (HCC)  05/29/2022 Initial Diagnosis   Palpable abnormalities in the left breast led to mammograms which revealed 3 masses 0.7 cm at 1 o'clock position, 3 cm at 2 o'clock position, additional nodule 8 mm total span 5 cm, 5 abnormal axillary lymph nodes biopsy of the masses and the lymph node were positive for grade 3 IDC with high-grade DCIS with LVI, ER 95%, PR 40%, Ki-67 60%, HER2 2+ by IHC FISH negative   06/15/2022 Cancer Staging   Staging form: Breast, AJCC 8th Edition - Clinical: Stage IIIB (cT3, cN3, cM0, G3, ER+, PR+, HER2-) - Signed by Loa Socks, NP on 06/15/2022 Stage prefix: Initial diagnosis Histologic grading system: 3 grade system   06/15/2022 Breast MRI   IMPRESSION: 1. Multiple sites of biopsy proven malignancy with in the lateral left breast. The overall conglomerate of suspicious findings spans approximately 4.4 x 3.5 x 3.5 cm, and contains all 3 post biopsy marking clips. 2. 10+ morphologically abnormal level I, II, III and Rotter's lymph nodes within the left axilla. 3. Diffuse skin and trabecular thickening on the left, consistent with lymphovascular spread of disease. 4.  No MRI evidence of malignancy on the right.   06/20/2022 -  Chemotherapy   Patient is on Treatment Plan : BREAST ADJUVANT DOSE DENSE AC q14d / PACLitaxel q7d      Genetic Testing   [employer] Multi-Cancer Panel+RNA was Negative. Report date is 06/13/2022.  The Multi-Cancer + RNA Panel offered by [facility] includes sequencing and/or deletion/duplication analysis of the following 70 genes:  AIP*, ALK, APC*, ATM*, AXIN2*, BAP1*, BARD1*, BLM*, BMPR1A*, BRCA1*, BRCA2*, BRIP1*, CDC73*, CDH1*, CDK4, CDKN1B*, CDKN2A, CHEK2*, CTNNA1*, DICER1*, EPCAM (del/dup only), EGFR, FH*, FLCN*, GREM1 (promoter dup only), HOXB13, KIT, LZTR1, MAX*, MBD4, MEN1*, MET, MITF, MLH1*, MSH2*, MSH3*, MSH6*, MUTYH*, NF1*, NF2*, NTHL1*, PALB2*, PDGFRA, PMS2*, POLD1*, POLE*, POT1*, PRKAR1A*, PTCH1*, PTEN*, RAD51C*, RAD51D*, RB1*, RET, SDHA* (sequencing only), SDHAF2*, SDHB*, SDHC*, SDHD*, SMAD4*, SMARCA4*, SMARCB1*, SMARCE1*, STK11*, SUFU*, TMEM127*, TP53*, TSC1*, TSC2*, VHL*. RNA analysis is performed for * genes.     CHIEF COMPLIANT: Cycle 10 Taxol   INTERVAL HISTORY: Lynn [name] is a 37 year old above-mentioned history of breast cancer is currently on chemotherapy with taxol. She presents to the clinic for a follow-up. Pt. Reports that neuropathy has got worst in the finger tips mainly in the right. It is not constant, she can button things but worst when she is doing daily activities. More painful and tingling. She can not touch anything cold. She does have some tingling and numbness in feet. States that foot slipped off pedal as she was driving.    ALLERGIES:  is allergic to macrobid [nitrofurantoin macrocrystal] and ibuprofen.  MEDICATIONS:  Current Outpatient Medications  Medication Sig Dispense Refill   acetaminophen (TYLENOL) 500 MG tablet Take 2 tablets (1,000 mg  total) by mouth every 6 (six) hours as needed for moderate pain or headache. 30 tablet 0   aspirin EC 325 MG tablet Take 325 mg by mouth in the morning.      aspirin-acetaminophen-caffeine (EXCEDRIN MIGRAINE) 250-250-65 MG tablet Take 1-2 tablets by mouth 2 (two) times daily as needed for headache or migraine.     Blood Glucose Monitoring Suppl (ACCU-CHEK AVIVA PLUS) w/Device KIT 1 each by Does not apply route daily at 6 (six) AM. 1 kit 0   carvedilol (COREG) 25 MG tablet Take 1 tablet (25 mg total) by mouth 2 (two) times daily with a meal. 180 tablet 1   cyclobenzaprine (FLEXERIL) 10 MG tablet Take 1 tablet (10 mg total) by mouth 3 (three) times daily as needed for muscle spasms. 30 tablet 1   famotidine (PEPCID) 20 MG tablet TAKE 1 TABLET(20 MG) BY MOUTH TWICE DAILY 180 tablet 0   glucose blood (ACCU-CHEK AVIVA PLUS) test strip Use as instructed 100 each 12   lidocaine (XYLOCAINE) 2 % jelly Apply 1 Application topically as needed. 85 g 0   lidocaine-prilocaine (EMLA) cream Apply to affected area once 30 g 3   magic mouthwash (lidocaine, diphenhydrAMINE, alum & mag hydroxide) suspension Take 5 mLs by mouth 3 (three) times daily as needed for mouth pain. 120 mL 0   ondansetron (ZOFRAN) 8 MG tablet Take 1 tab (8 mg) by mouth every 8 hrs as needed for nausea/vomiting. Start third day after doxorubicin/cyclophosphamide chemotherapy. 30 tablet 1   ONE TOUCH CLUB LANCETS MISC 1 each by Does not apply route daily. 100 each 11   prochlorperazine (COMPAZINE) 10 MG tablet Take 1 tablet (10 mg total) by mouth every 6 (six) hours as needed for nausea or vomiting. 30 tablet 1   Semaglutide,0.25 or 0.5MG /DOS, (OZEMPIC, 0.25 OR 0.5 MG/DOSE,) 2 MG/3ML SOPN Inject 0.25 mg into the skin once a week. 3 mL 3   ALPRAZolam (XANAX) 0.5 MG tablet Take 1 tablet (0.5 mg total) by mouth at bedtime as needed for anxiety. 30 tablet 1   No current facility-administered medications for this visit.    PHYSICAL EXAMINATION: ECOG PERFORMANCE STATUS: 1 - Symptomatic but completely ambulatory  Vitals:   10/16/22 0826  BP: (!) 143/106  Pulse: (!) 112  Resp: 18  Temp: 97.7 F (36.5  C)  SpO2: 97%   Filed Weights   10/16/22 0826  Weight: (!) 334 lb 8 oz (151.7 kg)     LABORATORY DATA:  I have reviewed the data as listed    Latest Ref Rng & Units 10/16/2022    8:13 AM 10/09/2022    1:23 PM 10/02/2022    8:14 AM  CMP  Glucose 70 - 99 mg/dL 829  562  130   BUN 6 - 20 mg/dL 15  13  10    Creatinine 0.44 - 1.00 mg/dL 8.65  7.84  6.96   Sodium 135 - 145 mmol/L 136  135  136   Potassium 3.5 - 5.1 mmol/L 4.3  4.3  4.2   Chloride 98 - 111 mmol/L 104  103  105   CO2 22 - 32 mmol/L 25  26  25    Calcium 8.9 - 10.3 mg/dL 8.9  8.8  8.4   Total Protein 6.5 - 8.1 g/dL 6.8  7.0  6.2   Total Bilirubin 0.3 - 1.2 mg/dL 0.5  0.6  0.6   Alkaline Phos 38 - 126 U/L 61  62  59   AST 15 -  41 U/L 17  21  19    ALT 0 - 44 U/L 22  27  28      Lab Results  Component Value Date   WBC 6.1 10/16/2022   HGB 10.7 (L) 10/16/2022   HCT 31.5 (L) 10/16/2022   MCV 87.5 10/16/2022   PLT 243 10/16/2022   NEUTROABS 4.3 10/16/2022    ASSESSMENT & PLAN:  Malignant neoplasm of overlapping sites of left breast in female, 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   06/27/2022: CT CAP: Left breast cancer, 2 left axillary lymph nodes, left adnexal cystic lesion 4 cm    Treatment Plan:  1. Neoadjuvant chemotherapy with Adriamycin and Cytoxan dose dense 4 followed by Taxol weekly 12  2. Followed by breast conserving surgery with targeted axillary dissection 3. Followed by adjuvant radiation therapy 4.  Followed by antiestrogen therapy ____________________________________________________   Current treatment: Completed 4 cycles of Adriamycin and Cytoxan.  Today is cycle 10 Taxol  Chemo toxicities:  Fatigue: Slowly improving Treatment related anemia: mild, hemoglobin 9.8, stable Nausea: managed with compazine Mucositis: Magic Mouthwash  Hyperglycemia: She was able to get her Ozempic  restarted. Headaches: Improved after stopping Zofran. IV fluids will be added to each of her treatments because of significant improvement in how she feels after the fluids were given. Peripheral neuropathy: We reduced the dosage of Taxol to 45 mg/m.  In spite of this her neuropathy appears to be getting worse.  Therefore we decided to discontinue further chemotherapy.  We will order a breast MRI and a follow-up with Dr. Dwain Sarna.  Her port can be removed during surgery. Return to clinic after surgery to discuss final pathology report.    Orders Placed This Encounter  Procedures   MR BREAST BILATERAL W WO CONTRAST INC CAD    Standing Status:   Future    Standing Expiration Date:   10/16/2023    Order Specific Question:   If indicated for the ordered procedure, I authorize the administration of contrast media per Radiology protocol    Answer:   Yes    Order Specific Question:   What is the patient's sedation requirement?    Answer:   No Sedation    Order Specific Question:   Does the patient have a pacemaker or implanted devices?    Answer:   No    Order Specific Question:   Preferred imaging location?    Answer:   [facility] (table limit-550lbs)    Order Specific Question:   Release to patient    Answer:   Immediate   The patient has a good understanding of the overall plan. she agrees with it. she will call with any problems that may develop before the next visit here. Total time spent: 30 mins including face to face time and time spent for planning, charting and co-ordination of care   Tamsen Meek, MD 10/16/22    I Janan Ridge am acting as a Neurosurgeon for [employer]  I have reviewed the above documentation for accuracy and completeness, and I agree with the above.

## 2022-10-15 MED FILL — Dexamethasone Sodium Phosphate Inj 100 MG/10ML: INTRAMUSCULAR | Qty: 1 | Status: AC

## 2022-10-16 ENCOUNTER — Inpatient Hospital Stay: Payer: Managed Care, Other (non HMO)

## 2022-10-16 ENCOUNTER — Encounter: Payer: Self-pay | Admitting: *Deleted

## 2022-10-16 ENCOUNTER — Inpatient Hospital Stay (HOSPITAL_BASED_OUTPATIENT_CLINIC_OR_DEPARTMENT_OTHER): Payer: Managed Care, Other (non HMO) | Admitting: Hematology and Oncology

## 2022-10-16 ENCOUNTER — Other Ambulatory Visit: Payer: Self-pay

## 2022-10-16 VITALS — BP 143/106 | HR 112 | Temp 97.7°F | Resp 18 | Ht 67.0 in | Wt 334.5 lb

## 2022-10-16 DIAGNOSIS — Z170 Estrogen receptor positive status [ER+]: Secondary | ICD-10-CM | POA: Diagnosis not present

## 2022-10-16 DIAGNOSIS — C50812 Malignant neoplasm of overlapping sites of left female breast: Secondary | ICD-10-CM | POA: Diagnosis not present

## 2022-10-16 DIAGNOSIS — Z95828 Presence of other vascular implants and grafts: Secondary | ICD-10-CM

## 2022-10-16 LAB — CBC WITH DIFFERENTIAL (CANCER CENTER ONLY)
Abs Immature Granulocytes: 0.06 10*3/uL (ref 0.00–0.07)
Basophils Absolute: 0.1 10*3/uL (ref 0.0–0.1)
Basophils Relative: 1 %
Eosinophils Absolute: 0.1 10*3/uL (ref 0.0–0.5)
Eosinophils Relative: 1 %
HCT: 31.5 % — ABNORMAL LOW (ref 36.0–46.0)
Hemoglobin: 10.7 g/dL — ABNORMAL LOW (ref 12.0–15.0)
Immature Granulocytes: 1 %
Lymphocytes Relative: 14 %
Lymphs Abs: 0.9 10*3/uL (ref 0.7–4.0)
MCH: 29.7 pg (ref 26.0–34.0)
MCHC: 34 g/dL (ref 30.0–36.0)
MCV: 87.5 fL (ref 80.0–100.0)
Monocytes Absolute: 0.7 10*3/uL (ref 0.1–1.0)
Monocytes Relative: 12 %
Neutro Abs: 4.3 10*3/uL (ref 1.7–7.7)
Neutrophils Relative %: 71 %
Platelet Count: 243 10*3/uL (ref 150–400)
RBC: 3.6 MIL/uL — ABNORMAL LOW (ref 3.87–5.11)
RDW: 15.5 % (ref 11.5–15.5)
WBC Count: 6.1 10*3/uL (ref 4.0–10.5)
nRBC: 0 % (ref 0.0–0.2)

## 2022-10-16 LAB — CMP (CANCER CENTER ONLY)
ALT: 22 U/L (ref 0–44)
AST: 17 U/L (ref 15–41)
Albumin: 3.9 g/dL (ref 3.5–5.0)
Alkaline Phosphatase: 61 U/L (ref 38–126)
Anion gap: 7 (ref 5–15)
BUN: 15 mg/dL (ref 6–20)
CO2: 25 mmol/L (ref 22–32)
Calcium: 8.9 mg/dL (ref 8.9–10.3)
Chloride: 104 mmol/L (ref 98–111)
Creatinine: 0.68 mg/dL (ref 0.44–1.00)
GFR, Estimated: >60 mL/min (ref >60)
Glucose, Bld: 176 mg/dL — ABNORMAL HIGH (ref 70–99)
Potassium: 4.3 mmol/L (ref 3.5–5.1)
Sodium: 136 mmol/L (ref 135–145)
Total Bilirubin: 0.5 mg/dL (ref 0.3–1.2)
Total Protein: 6.8 g/dL (ref 6.5–8.1)

## 2022-10-16 MED ORDER — ALPRAZOLAM 0.5 MG PO TABS: 0.5000 mg | ORAL_TABLET | Freq: Every evening | ORAL | 1 refills | Status: DC | PRN

## 2022-10-16 MED ORDER — SODIUM CHLORIDE 0.9% FLUSH
10.0000 mL | INTRAVENOUS | Status: DC | PRN
  Administered 2022-10-16: 10 mL

## 2022-10-16 NOTE — Assessment & Plan Note (Addendum)
stage IIIB ER/PR positive breast cancer diagnosed in 06/2022 currently receiving neoadjuvant chemotherapy.   06/27/2022: CT CAP: Left breast cancer, 2 left axillary lymph nodes, left adnexal cystic lesion 4 cm    Treatment Plan:  1. Neoadjuvant chemotherapy with Adriamycin and Cytoxan dose dense 4 followed by Taxol weekly 12  2. Followed by breast conserving surgery with targeted axillary dissection 3. Followed by adjuvant radiation therapy 4.  Followed by antiestrogen therapy ____________________________________________________   Current treatment: Completed 4 cycles of Adriamycin and Cytoxan.  Today is cycle 10 Taxol  Chemo toxicities:  Fatigue: Slowly improving Treatment related anemia: mild, hemoglobin 9.8, stable Nausea: managed with compazine Mucositis: Magic Mouthwash  Hyperglycemia: She was able to get her Ozempic restarted. Headaches: Improved after stopping Zofran. IV fluids will be added to each of her treatments because of significant improvement in how she feels after the fluids were given. Peripheral neuropathy: We reduced the dosage of Taxol to 45 mg/m.  In spite of this her neuropathy appears to be getting worse.  Therefore we decided to discontinue further chemotherapy.  We will order a breast MRI and a follow-up with Dr. Dwain Sarna.  Her port can be removed during surgery. Return to clinic after surgery to discuss final pathology report.

## 2022-10-22 ENCOUNTER — Telehealth: Payer: Self-pay | Admitting: *Deleted

## 2022-10-22 NOTE — Telephone Encounter (Signed)
Spoke with patient regarding scheduling of her MRI.  She states someone called her Friday but had to call her back b/c they were trying to work her in but she never heard back.  Informed her I would let GI know to give her a call to get this scheduled.  Patient verbalized understanding.

## 2022-10-23 ENCOUNTER — Encounter: Payer: Self-pay | Admitting: *Deleted

## 2022-10-23 ENCOUNTER — Inpatient Hospital Stay: Payer: Managed Care, Other (non HMO)

## 2022-10-29 ENCOUNTER — Ambulatory Visit
Admission: RE | Admit: 2022-10-29 | Discharge: 2022-10-29 | Disposition: A | Discharge status: Home / Self Care | Payer: Managed Care, Other (non HMO) | Source: Ambulatory Visit | Attending: Hematology and Oncology | Admitting: Hematology and Oncology

## 2022-10-29 DIAGNOSIS — Z170 Estrogen receptor positive status [ER+]: Secondary | ICD-10-CM

## 2022-10-29 MED ORDER — GADOPICLENOL 0.5 MMOL/ML IV SOLN
10.0000 mL | Freq: Once | INTRAVENOUS | Status: AC | PRN
  Administered 2022-10-29: 10 mL via INTRAVENOUS

## 2022-10-30 ENCOUNTER — Inpatient Hospital Stay: Payer: Managed Care, Other (non HMO) | Admitting: Hematology and Oncology

## 2022-10-30 ENCOUNTER — Encounter: Payer: Self-pay | Admitting: *Deleted

## 2022-10-30 ENCOUNTER — Inpatient Hospital Stay: Payer: Managed Care, Other (non HMO)

## 2022-11-03 ENCOUNTER — Other Ambulatory Visit: Payer: Self-pay

## 2022-11-05 ENCOUNTER — Other Ambulatory Visit: Payer: Self-pay | Admitting: General Surgery

## 2022-11-05 DIAGNOSIS — C50812 Malignant neoplasm of overlapping sites of left female breast: Secondary | ICD-10-CM

## 2022-11-06 ENCOUNTER — Encounter: Payer: Self-pay | Admitting: *Deleted

## 2022-11-06 DIAGNOSIS — Z170 Estrogen receptor positive status [ER+]: Secondary | ICD-10-CM

## 2022-11-07 ENCOUNTER — Other Ambulatory Visit: Payer: Self-pay | Admitting: General Surgery

## 2022-11-07 ENCOUNTER — Other Ambulatory Visit: Payer: Self-pay

## 2022-11-07 DIAGNOSIS — C50812 Malignant neoplasm of overlapping sites of left female breast: Secondary | ICD-10-CM

## 2022-11-14 ENCOUNTER — Other Ambulatory Visit (HOSPITAL_COMMUNITY): Payer: Managed Care, Other (non HMO)

## 2022-11-14 NOTE — Pre-Procedure Instructions (Signed)
Surgical Instructions   Your procedure is scheduled on November 20, 2022. Report to [facility] Main Entrance "A" at 7:00 A.M., then check in with the Admitting office. Any questions or running late day of surgery: call [phone]  Questions prior to your surgery date: call [phone], Monday-Friday, 8am-4pm. If you experience any cold or flu symptoms such as cough, fever, chills, shortness of breath, etc. between now and your scheduled surgery, please notify us at the above number.     Remember:  Do not eat after midnight the night before your surgery   You may drink clear liquids until 6:00 AM the morning of your surgery.   Clear liquids allowed are: Water, Non-Citrus Juices (without pulp), Carbonated Beverages, Clear Tea, Black Coffee Only (NO MILK, CREAM OR POWDERED CREAMER of any kind), and Gatorade.  Patient Instructions  The night before surgery:  No food after midnight. ONLY clear liquids after midnight  The day of surgery (if you have diabetes): Drink ONE (1) 12 oz G2 given to you in your pre admission testing appointment by 6:00 AM the morning of surgery. Drink in one sitting. Do not sip.  This drink was given to you during your hospital  pre-op appointment visit.  Nothing else to drink after completing the  12 oz bottle of G2.         If you have questions, please contact your surgeon's office.     Take these medicines the morning of surgery with A SIP OF WATER: carvedilol (COREG)  famotidine (PEPCID)   May take these medicines IF NEEDED: acetaminophen (TYLENOL)  ALPRAZolam (XANAX)  cyclobenzaprine (FLEXERIL)    Follow your surgeon's instructions on when to stop Aspirin.  If no instructions were given by your surgeon then you will need to call the office to get those instructions.     One week prior to surgery, STOP taking any Aleve, Naproxen, Ibuprofen, Motrin, Advil, Goody's, BC's, all herbal medications, fish oil, and non-prescription vitamins. This  includes your medication: aspirin-acetaminophen-caffeine (EXCEDRIN MIGRAINE)    WHAT DO I DO ABOUT MY DIABETES MEDICATION?   STOP taking your Semaglutide [facility]) one week prior to surgery.   HOW TO MANAGE YOUR DIABETES BEFORE AND AFTER SURGERY  Why is it important to control my blood sugar before and after surgery? Improving blood sugar levels before and after surgery helps healing and can limit problems. A way of improving blood sugar control is eating a healthy diet by:  Eating less sugar and carbohydrates  Increasing activity/exercise  Talking with your doctor about reaching your blood sugar goals High blood sugars (greater than 180 mg/dL) can raise your risk of infections and slow your recovery, so you will need to focus on controlling your diabetes during the weeks before surgery. Make sure that the doctor who takes care of your diabetes knows about your planned surgery including the date and location.  How do I manage my blood sugar before surgery? Check your blood sugar at least 4 times a day, starting 2 days before surgery, to make sure that the level is not too high or low.  Check your blood sugar the morning of your surgery when you wake up and every 2 hours until you get to the Short Stay unit.  If your blood sugar is less than 70 mg/dL, you will need to treat for low blood sugar: Do not take insulin. Treat a low blood sugar (less than 70 mg/dL) with  cup of clear juice (cranberry or apple), 4 glucose  tablets, OR glucose gel. Recheck blood sugar in 15 minutes after treatment (to make sure it is greater than 70 mg/dL). If your blood sugar is not greater than 70 mg/dL on recheck, call [phone] for further instructions. Report your blood sugar to the short stay nurse when you get to Short Stay.  If you are admitted to the hospital after surgery: Your blood sugar will be checked by the staff and you will probably be given insulin after surgery (instead of oral diabetes  medicines) to make sure you have good blood sugar levels. The goal for blood sugar control after surgery is 80-180 mg/dL.                      Do NOT Smoke (Tobacco/Vaping) for 24 hours prior to your procedure.  If you use a CPAP at night, you may bring your mask/headgear for your overnight stay.   You will be asked to remove any contacts, glasses, piercing's, hearing aid's, dentures/partials prior to surgery. Please bring cases for these items if needed.    Patients discharged the day of surgery will not be allowed to drive home, and someone needs to stay with them for 24 hours.  SURGICAL WAITING ROOM VISITATION Patients may have no more than 2 support people in the waiting area - these visitors may rotate.   Pre-op nurse will coordinate an appropriate time for 1 ADULT support person, who may not rotate, to accompany patient in pre-op.  Children under the age of 56 must have an adult with them who is not the patient and must remain in the main waiting area with an adult.  If the patient needs to stay at the hospital during part of their recovery, the visitor guidelines for inpatient rooms apply.  Please refer to the [facility] website for the visitor guidelines for any additional information.   If you received a COVID test during your pre-op visit  it is requested that you wear a mask when out in public, stay away from anyone that may not be feeling well and notify your surgeon if you develop symptoms. If you have been in contact with anyone that has tested positive in the last 10 days please notify you surgeon.      Pre-operative CHG Bathing Instructions   You can play a key role in reducing the risk of infection after surgery. Your skin needs to be as free of germs as possible. You can reduce the number of germs on your skin by washing with CHG (chlorhexidine gluconate) soap before surgery. CHG is an antiseptic soap that kills germs and continues to kill germs even after washing.    DO NOT use if you have an allergy to chlorhexidine/CHG or antibacterial soaps. If your skin becomes reddened or irritated, stop using the CHG and notify one of our RNs at [phone].              TAKE A SHOWER THE NIGHT BEFORE SURGERY AND THE DAY OF SURGERY    Please keep in mind the following:  DO NOT shave, including legs and underarms, 48 hours prior to surgery.   You may shave your face before/day of surgery.  Place clean sheets on your bed the night before surgery Use a clean washcloth (not used since being washed) for each shower. DO NOT sleep with pet's night before surgery.  CHG Shower Instructions:  If you choose to wash your hair and private area, wash first with your normal shampoo/soap.  After you use shampoo/soap, rinse your hair and body thoroughly to remove shampoo/soap residue.  Turn the water OFF and apply half the bottle of CHG soap to a CLEAN washcloth.  Apply CHG soap ONLY FROM YOUR NECK DOWN TO YOUR TOES (washing for 3-5 minutes)  DO NOT use CHG soap on face, private areas, open wounds, or sores.  Pay special attention to the area where your surgery is being performed.  If you are having back surgery, having someone wash your back for you may be helpful. Wait 2 minutes after CHG soap is applied, then you may rinse off the CHG soap.  Pat dry with a clean towel  Put on clean pajamas    Additional instructions for the day of surgery: DO NOT APPLY any lotions, deodorants, cologne, or perfumes.   Do not wear jewelry or makeup Do not wear nail polish, gel polish, artificial nails, or any other type of covering on natural nails (fingers and toes) Do not bring valuables to the hospital. [facility] is not responsible for valuables/personal belongings. Put on clean/comfortable clothes.  Please brush your teeth.  Ask your nurse before applying any prescription medications to the skin.

## 2022-11-15 ENCOUNTER — Encounter (HOSPITAL_COMMUNITY): Payer: Self-pay

## 2022-11-15 ENCOUNTER — Other Ambulatory Visit: Payer: Self-pay

## 2022-11-15 ENCOUNTER — Encounter (HOSPITAL_COMMUNITY)
Admission: RE | Admit: 2022-11-15 | Discharge: 2022-11-15 | Disposition: A | Discharge status: Home / Self Care | Payer: Managed Care, Other (non HMO) | Source: Ambulatory Visit | Attending: General Surgery | Admitting: General Surgery

## 2022-11-15 VITALS — BP 128/89 | HR 103 | Temp 97.6°F | Resp 19 | Ht 67.0 in | Wt 340.0 lb

## 2022-11-15 DIAGNOSIS — Z01812 Encounter for preprocedural laboratory examination: Secondary | ICD-10-CM | POA: Insufficient documentation

## 2022-11-15 DIAGNOSIS — E119 Type 2 diabetes mellitus without complications: Secondary | ICD-10-CM | POA: Diagnosis not present

## 2022-11-15 DIAGNOSIS — Z01818 Encounter for other preprocedural examination: Secondary | ICD-10-CM | POA: Diagnosis present

## 2022-11-15 HISTORY — DX: Cardiac arrhythmia, unspecified: I49.9

## 2022-11-15 HISTORY — DX: Gastro-esophageal reflux disease without esophagitis: K21.9

## 2022-11-15 HISTORY — DX: Anemia, unspecified: D64.9

## 2022-11-15 HISTORY — DX: Depression, unspecified: F32.A

## 2022-11-15 HISTORY — DX: Umbilical hernia without obstruction or gangrene: K42.9

## 2022-11-15 HISTORY — DX: Pneumonia, unspecified organism: J18.9

## 2022-11-15 LAB — CBC
HCT: 35 % — ABNORMAL LOW (ref 36.0–46.0)
Hemoglobin: 11.4 g/dL — ABNORMAL LOW (ref 12.0–15.0)
MCH: 27.5 pg (ref 26.0–34.0)
MCHC: 32.6 g/dL (ref 30.0–36.0)
MCV: 84.3 fL (ref 80.0–100.0)
Platelets: 235 10*3/uL (ref 150–400)
RBC: 4.15 MIL/uL (ref 3.87–5.11)
RDW: 13.8 % (ref 11.5–15.5)
WBC: 6.3 10*3/uL (ref 4.0–10.5)
nRBC: 0 % (ref 0.0–0.2)

## 2022-11-15 LAB — HEMOGLOBIN A1C
Hgb A1c MFr Bld: 6 % — ABNORMAL HIGH (ref 4.8–5.6)
Mean Plasma Glucose: 125.5 mg/dL

## 2022-11-15 LAB — BASIC METABOLIC PANEL
Anion gap: 9 (ref 5–15)
BUN: 12 mg/dL (ref 6–20)
CO2: 25 mmol/L (ref 22–32)
Calcium: 8.5 mg/dL — ABNORMAL LOW (ref 8.9–10.3)
Chloride: 101 mmol/L (ref 98–111)
Creatinine, Ser: 0.7 mg/dL (ref 0.44–1.00)
GFR, Estimated: >60 mL/min (ref >60)
Glucose, Bld: 205 mg/dL — ABNORMAL HIGH (ref 70–99)
Potassium: 4.1 mmol/L (ref 3.5–5.1)
Sodium: 135 mmol/L (ref 135–145)

## 2022-11-15 LAB — GLUCOSE, CAPILLARY: Glucose-Capillary: 205 mg/dL — ABNORMAL HIGH (ref 70–99)

## 2022-11-15 NOTE — Progress Notes (Signed)
PCP - Rodman Pickle, NP Cardiologist - Pt saw Dr. Kirke Corin in 2018 for palpitations. PRN follow-up  PPM/ICD - Denies Device Orders - n/a Rep Notified - n/a  Chest x-ray - 06/20/2022 EKG - 09/04/2022 Stress Test - denies ECHO - 09/06/2022 Cardiac Cath - Denies  Sleep Study - Denies. STOPBANG result 5. Neck size 25  Pt is DM2. She does not check her blood sugar since her meter broke. She has not gotten a new meter yet. CBG at pre-op 205. A1c result pending.  Last dose of GLP1 agonist- Last Ozempic dose Tuesday, August 13th. GLP1 instructions: Pt instructed to not take anymore doses until after surgery.  Blood Thinner Instructions: n/a Aspirin Instructions: Pt instructed to reach out to Dr. Dwain Sarna for ASA instructions.  ERAS Protcol - Clear liquids until 0600 morning of surgery PRE-SURGERY Ensure or G2- G2 given to pt with instructions  COVID TEST- n/a   Anesthesia review: Yes. Breast seed placement.    Patient denies shortness of breath, fever, cough and chest pain at PAT appointment. Pt denies any respiratory illness/infection in the last two months.   All instructions explained to the patient, with a verbal understanding of the material. Patient agrees to go over the instructions while at home for a better understanding. Patient also instructed to self quarantine after being tested for COVID-19. The opportunity to ask questions was provided.

## 2022-11-19 ENCOUNTER — Other Ambulatory Visit: Payer: Self-pay | Admitting: General Surgery

## 2022-11-19 ENCOUNTER — Encounter (HOSPITAL_COMMUNITY)
Admission: RE | Admit: 2022-11-19 | Discharge: 2022-11-19 | Disposition: A | Discharge status: Home / Self Care | Payer: Managed Care, Other (non HMO) | Source: Ambulatory Visit | Attending: General Surgery | Admitting: General Surgery

## 2022-11-19 ENCOUNTER — Ambulatory Visit
Admission: RE | Admit: 2022-11-19 | Discharge: 2022-11-19 | Disposition: A | Discharge status: Home / Self Care | Payer: Managed Care, Other (non HMO) | Source: Ambulatory Visit | Attending: General Surgery | Admitting: General Surgery

## 2022-11-19 DIAGNOSIS — C50812 Malignant neoplasm of overlapping sites of left female breast: Secondary | ICD-10-CM

## 2022-11-19 DIAGNOSIS — Z01812 Encounter for preprocedural laboratory examination: Secondary | ICD-10-CM | POA: Diagnosis not present

## 2022-11-19 DIAGNOSIS — Z3202 Encounter for pregnancy test, result negative: Secondary | ICD-10-CM | POA: Insufficient documentation

## 2022-11-19 DIAGNOSIS — Z01818 Encounter for other preprocedural examination: Secondary | ICD-10-CM

## 2022-11-19 HISTORY — PX: BREAST BIOPSY: SHX20

## 2022-11-19 LAB — POCT PREGNANCY, URINE: Preg Test, Ur: NEGATIVE

## 2022-11-19 NOTE — Progress Notes (Signed)
Urine pregnancy was negative on 11/19/22.

## 2022-11-20 ENCOUNTER — Ambulatory Visit (HOSPITAL_COMMUNITY): Payer: Managed Care, Other (non HMO) | Admitting: Physician Assistant

## 2022-11-20 ENCOUNTER — Other Ambulatory Visit: Payer: Self-pay

## 2022-11-20 ENCOUNTER — Ambulatory Visit
Admission: RE | Admit: 2022-11-20 | Discharge: 2022-11-20 | Discharge status: Home / Self Care | Payer: Managed Care, Other (non HMO) | Source: Ambulatory Visit | Attending: General Surgery | Admitting: General Surgery

## 2022-11-20 ENCOUNTER — Ambulatory Visit
Admission: RE | Admit: 2022-11-20 | Discharge: 2022-11-20 | Disposition: A | Discharge status: Home / Self Care | Payer: Managed Care, Other (non HMO) | Source: Ambulatory Visit | Attending: General Surgery | Admitting: General Surgery

## 2022-11-20 ENCOUNTER — Ambulatory Visit (HOSPITAL_BASED_OUTPATIENT_CLINIC_OR_DEPARTMENT_OTHER): Payer: Managed Care, Other (non HMO) | Admitting: Certified Registered Nurse Anesthetist

## 2022-11-20 ENCOUNTER — Observation Stay (HOSPITAL_COMMUNITY)
Admission: RE | Admit: 2022-11-20 | Discharge: 2022-11-21 | Disposition: A | Discharge status: Home / Self Care | Payer: Managed Care, Other (non HMO) | Attending: General Surgery | Admitting: General Surgery

## 2022-11-20 ENCOUNTER — Encounter (HOSPITAL_COMMUNITY)
Admission: RE | Disposition: A | Discharge status: Home / Self Care | Payer: Self-pay | Source: Home / Self Care | Attending: General Surgery

## 2022-11-20 ENCOUNTER — Encounter (HOSPITAL_COMMUNITY): Payer: Self-pay | Admitting: General Surgery

## 2022-11-20 DIAGNOSIS — Z170 Estrogen receptor positive status [ER+]: Secondary | ICD-10-CM

## 2022-11-20 DIAGNOSIS — Z7982 Long term (current) use of aspirin: Secondary | ICD-10-CM | POA: Diagnosis not present

## 2022-11-20 DIAGNOSIS — E119 Type 2 diabetes mellitus without complications: Secondary | ICD-10-CM | POA: Insufficient documentation

## 2022-11-20 DIAGNOSIS — Z79899 Other long term (current) drug therapy: Secondary | ICD-10-CM | POA: Diagnosis not present

## 2022-11-20 DIAGNOSIS — I10 Essential (primary) hypertension: Secondary | ICD-10-CM

## 2022-11-20 DIAGNOSIS — C50812 Malignant neoplasm of overlapping sites of left female breast: Secondary | ICD-10-CM

## 2022-11-20 DIAGNOSIS — Z87891 Personal history of nicotine dependence: Secondary | ICD-10-CM

## 2022-11-20 DIAGNOSIS — Z01818 Encounter for other preprocedural examination: Secondary | ICD-10-CM

## 2022-11-20 DIAGNOSIS — C50919 Malignant neoplasm of unspecified site of unspecified female breast: Secondary | ICD-10-CM | POA: Diagnosis present

## 2022-11-20 HISTORY — PX: PORT-A-CATH REMOVAL: SHX5289

## 2022-11-20 HISTORY — PX: BREAST LUMPECTOMY WITH RADIOACTIVE SEED AND AXILLARY LYMPH NODE DISSECTION: SHX6656

## 2022-11-20 HISTORY — PX: AXILLARY LYMPH NODE DISSECTION: SHX5229

## 2022-11-20 LAB — GLUCOSE, CAPILLARY
Glucose-Capillary: 205 mg/dL — ABNORMAL HIGH (ref 70–99)
Glucose-Capillary: 180 mg/dL — ABNORMAL HIGH (ref 70–99)
Glucose-Capillary: 250 mg/dL — ABNORMAL HIGH (ref 70–99)
Glucose-Capillary: 221 mg/dL — ABNORMAL HIGH (ref 70–99)

## 2022-11-20 SURGERY — BREAST LUMPECTOMY WITH RADIOACTIVE SEED AND AXILLARY LYMPH NODE DISSECTION
Anesthesia: General | Site: Chest | Laterality: Right

## 2022-11-20 MED ORDER — KETOROLAC TROMETHAMINE 30 MG/ML IJ SOLN: 30.0000 mg | Freq: Once | INTRAMUSCULAR | Status: DC | PRN

## 2022-11-20 MED ORDER — 0.9 % SODIUM CHLORIDE (POUR BTL) OPTIME
TOPICAL | Status: DC | PRN
  Administered 2022-11-20: 1000 mL

## 2022-11-20 MED ORDER — SUGAMMADEX SODIUM 200 MG/2ML IV SOLN
INTRAVENOUS | Status: DC | PRN
  Administered 2022-11-20: 400 mg via INTRAVENOUS

## 2022-11-20 MED ORDER — HEPARIN SODIUM (PORCINE) 5000 UNIT/ML IJ SOLN
5000.0000 [IU] | Freq: Three times a day (TID) | INTRAMUSCULAR | Status: DC
  Administered 2022-11-21: 5000 [IU] via SUBCUTANEOUS
  Filled 2022-11-20: qty 1

## 2022-11-20 MED ORDER — SUCCINYLCHOLINE CHLORIDE 200 MG/10ML IV SOSY
PREFILLED_SYRINGE | INTRAVENOUS | Status: DC | PRN
  Administered 2022-11-20: 180 mg via INTRAVENOUS

## 2022-11-20 MED ORDER — OXYCODONE HCL 5 MG PO TABS
ORAL_TABLET | ORAL | Status: AC
  Administered 2022-11-20: 5 mg via ORAL
  Filled 2022-11-20: qty 1

## 2022-11-20 MED ORDER — PROPOFOL 10 MG/ML IV BOLUS
INTRAVENOUS | Status: DC | PRN
  Administered 2022-11-20: 270 mg via INTRAVENOUS

## 2022-11-20 MED ORDER — CARVEDILOL 25 MG PO TABS
25.0000 mg | ORAL_TABLET | Freq: Two times a day (BID) | ORAL | Status: DC
  Administered 2022-11-20 – 2022-11-21 (×2): 25 mg via ORAL
  Filled 2022-11-20 (×2): qty 1

## 2022-11-20 MED ORDER — DEXAMETHASONE SODIUM PHOSPHATE 10 MG/ML IJ SOLN
INTRAMUSCULAR | Status: DC | PRN
  Administered 2022-11-20: 4 mg via INTRAVENOUS

## 2022-11-20 MED ORDER — INSULIN ASPART 100 UNIT/ML IJ SOLN
0.0000 [IU] | INTRAMUSCULAR | Status: DC | PRN
  Administered 2022-11-20: 4 [IU] via SUBCUTANEOUS
  Filled 2022-11-20: qty 1

## 2022-11-20 MED ORDER — CEFAZOLIN IN SODIUM CHLORIDE 3-0.9 GM/100ML-% IV SOLN
3.0000 g | INTRAVENOUS | Status: DC
  Filled 2022-11-20: qty 100

## 2022-11-20 MED ORDER — OXYCODONE HCL 5 MG/5ML PO SOLN: 5.0000 mg | Freq: Once | ORAL | Status: AC | PRN

## 2022-11-20 MED ORDER — INSULIN ASPART 100 UNIT/ML IJ SOLN
0.0000 [IU] | Freq: Three times a day (TID) | INTRAMUSCULAR | Status: DC
  Administered 2022-11-20: 5 [IU] via SUBCUTANEOUS
  Administered 2022-11-21: 2 [IU] via SUBCUTANEOUS

## 2022-11-20 MED ORDER — INSULIN ASPART 100 UNIT/ML IJ SOLN
0.0000 [IU] | Freq: Every day | INTRAMUSCULAR | Status: DC
  Administered 2022-11-20: 2 [IU] via SUBCUTANEOUS

## 2022-11-20 MED ORDER — ONDANSETRON HCL 4 MG/2ML IJ SOLN
INTRAMUSCULAR | Status: DC | PRN
Start: 2022-11-20 — End: 2022-11-20
  Administered 2022-11-20: 4 mg via INTRAVENOUS

## 2022-11-20 MED ORDER — ROCURONIUM BROMIDE 10 MG/ML (PF) SYRINGE
PREFILLED_SYRINGE | INTRAVENOUS | Status: DC | PRN
  Administered 2022-11-20: 5 mg via INTRAVENOUS
  Administered 2022-11-20: 50 mg via INTRAVENOUS

## 2022-11-20 MED ORDER — CHLORHEXIDINE GLUCONATE CLOTH 2 % EX PADS: 6.0000 | MEDICATED_PAD | Freq: Once | CUTANEOUS | Status: DC

## 2022-11-20 MED ORDER — FENTANYL CITRATE (PF) 100 MCG/2ML IJ SOLN
INTRAMUSCULAR | Status: AC
  Filled 2022-11-20: qty 2

## 2022-11-20 MED ORDER — ALPRAZOLAM 0.5 MG PO TABS
0.5000 mg | ORAL_TABLET | Freq: Every evening | ORAL | Status: DC | PRN
  Administered 2022-11-20: 0.5 mg via ORAL
  Filled 2022-11-20: qty 1

## 2022-11-20 MED ORDER — AMISULPRIDE (ANTIEMETIC) 5 MG/2ML IV SOLN
INTRAVENOUS | Status: AC
  Filled 2022-11-20: qty 4

## 2022-11-20 MED ORDER — BUPIVACAINE-EPINEPHRINE 0.25% -1:200000 IJ SOLN
INTRAMUSCULAR | Status: DC | PRN
  Administered 2022-11-20: 5 mL

## 2022-11-20 MED ORDER — HEMOSTATIC AGENTS (NO CHARGE) OPTIME
TOPICAL | Status: DC | PRN
Start: 2022-11-20 — End: 2022-11-20
  Administered 2022-11-20: 1 via TOPICAL

## 2022-11-20 MED ORDER — PHENYLEPHRINE HCL-NACL 20-0.9 MG/250ML-% IV SOLN
INTRAVENOUS | Status: DC | PRN
  Administered 2022-11-20: 30 ug/min via INTRAVENOUS

## 2022-11-20 MED ORDER — BUPIVACAINE-EPINEPHRINE (PF) 0.25% -1:200000 IJ SOLN
INTRAMUSCULAR | Status: AC
  Filled 2022-11-20: qty 30

## 2022-11-20 MED ORDER — PHENYLEPHRINE 80 MCG/ML (10ML) SYRINGE FOR IV PUSH (FOR BLOOD PRESSURE SUPPORT)
PREFILLED_SYRINGE | INTRAVENOUS | Status: DC | PRN
  Administered 2022-11-20 (×2): 80 ug via INTRAVENOUS
  Administered 2022-11-20: 160 ug via INTRAVENOUS

## 2022-11-20 MED ORDER — FENTANYL CITRATE (PF) 250 MCG/5ML IJ SOLN
INTRAMUSCULAR | Status: AC
  Filled 2022-11-20: qty 5

## 2022-11-20 MED ORDER — LACTATED RINGERS IV SOLN: INTRAVENOUS | Status: DC

## 2022-11-20 MED ORDER — FENTANYL CITRATE (PF) 250 MCG/5ML IJ SOLN
INTRAMUSCULAR | Status: DC | PRN
  Administered 2022-11-20 (×5): 50 ug via INTRAVENOUS

## 2022-11-20 MED ORDER — ONDANSETRON HCL 4 MG/2ML IJ SOLN
INTRAMUSCULAR | Status: AC
  Filled 2022-11-20: qty 2

## 2022-11-20 MED ORDER — FAMOTIDINE 20 MG PO TABS
20.0000 mg | ORAL_TABLET | Freq: Two times a day (BID) | ORAL | Status: DC
  Administered 2022-11-20 – 2022-11-21 (×3): 20 mg via ORAL
  Filled 2022-11-20 (×3): qty 1

## 2022-11-20 MED ORDER — ROPIVACAINE HCL 5 MG/ML IJ SOLN
INTRAMUSCULAR | Status: DC | PRN
  Administered 2022-11-20: 30 mL

## 2022-11-20 MED ORDER — ORAL CARE MOUTH RINSE: 15.0000 mL | Freq: Once | OROMUCOSAL | Status: AC

## 2022-11-20 MED ORDER — MIDAZOLAM HCL 2 MG/2ML IJ SOLN: 2.0000 mg | Freq: Once | INTRAMUSCULAR | Status: AC

## 2022-11-20 MED ORDER — AMISULPRIDE (ANTIEMETIC) 5 MG/2ML IV SOLN
10.0000 mg | Freq: Once | INTRAVENOUS | Status: AC
  Administered 2022-11-20: 10 mg via INTRAVENOUS

## 2022-11-20 MED ORDER — ENSURE PRE-SURGERY PO LIQD: 296.0000 mL | Freq: Once | ORAL | Status: DC

## 2022-11-20 MED ORDER — ONDANSETRON HCL 4 MG/2ML IJ SOLN
4.0000 mg | Freq: Four times a day (QID) | INTRAMUSCULAR | Status: DC | PRN
  Administered 2022-11-20: 4 mg via INTRAVENOUS
  Filled 2022-11-20: qty 2

## 2022-11-20 MED ORDER — MORPHINE SULFATE (PF) 2 MG/ML IV SOLN
2.0000 mg | INTRAVENOUS | Status: DC | PRN
  Administered 2022-11-20 – 2022-11-21 (×6): 2 mg via INTRAVENOUS
  Filled 2022-11-20 (×6): qty 1

## 2022-11-20 MED ORDER — FENTANYL CITRATE (PF) 100 MCG/2ML IJ SOLN
25.0000 ug | INTRAMUSCULAR | Status: DC | PRN
  Administered 2022-11-20 (×2): 25 ug via INTRAVENOUS

## 2022-11-20 MED ORDER — MIDAZOLAM HCL 2 MG/2ML IJ SOLN
INTRAMUSCULAR | Status: AC
  Administered 2022-11-20: 2 mg via INTRAVENOUS
  Filled 2022-11-20: qty 2

## 2022-11-20 MED ORDER — CHLORHEXIDINE GLUCONATE 0.12 % MT SOLN
15.0000 mL | Freq: Once | OROMUCOSAL | Status: AC
  Administered 2022-11-20: 15 mL via OROMUCOSAL
  Filled 2022-11-20: qty 15

## 2022-11-20 MED ORDER — ACETAMINOPHEN 500 MG PO TABS
1000.0000 mg | ORAL_TABLET | ORAL | Status: AC
  Administered 2022-11-20: 1000 mg via ORAL
  Filled 2022-11-20: qty 2

## 2022-11-20 MED ORDER — CYCLOBENZAPRINE HCL 10 MG PO TABS: 10.0000 mg | ORAL_TABLET | Freq: Three times a day (TID) | ORAL | Status: DC | PRN

## 2022-11-20 MED ORDER — OXYCODONE HCL 5 MG PO TABS: 5.0000 mg | ORAL_TABLET | Freq: Once | ORAL | Status: AC | PRN

## 2022-11-20 MED ORDER — SODIUM CHLORIDE 0.9 % IV SOLN: INTRAVENOUS | Status: DC

## 2022-11-20 MED ORDER — ACETAMINOPHEN 500 MG PO TABS
1000.0000 mg | ORAL_TABLET | Freq: Four times a day (QID) | ORAL | Status: DC
  Administered 2022-11-20 – 2022-11-21 (×4): 1000 mg via ORAL
  Filled 2022-11-20 (×5): qty 2

## 2022-11-20 MED ORDER — SIMETHICONE 80 MG PO CHEW: 40.0000 mg | CHEWABLE_TABLET | Freq: Four times a day (QID) | ORAL | Status: DC | PRN

## 2022-11-20 MED ORDER — PROPOFOL 10 MG/ML IV BOLUS
INTRAVENOUS | Status: AC
  Filled 2022-11-20: qty 20

## 2022-11-20 MED ORDER — ONDANSETRON 4 MG PO TBDP
4.0000 mg | ORAL_TABLET | Freq: Four times a day (QID) | ORAL | Status: DC | PRN
  Administered 2022-11-20: 4 mg via ORAL
  Filled 2022-11-20: qty 1

## 2022-11-20 MED ORDER — ONDANSETRON HCL 4 MG/2ML IJ SOLN
4.0000 mg | Freq: Once | INTRAMUSCULAR | Status: AC | PRN
  Administered 2022-11-20: 4 mg via INTRAVENOUS

## 2022-11-20 MED ORDER — FENTANYL CITRATE (PF) 100 MCG/2ML IJ SOLN: 100.0000 ug | Freq: Once | INTRAMUSCULAR | Status: AC

## 2022-11-20 MED ORDER — OXYCODONE HCL 5 MG PO TABS: 5.0000 mg | ORAL_TABLET | ORAL | Status: DC | PRN

## 2022-11-20 MED ORDER — DEXTROSE 5 % IV SOLN
INTRAVENOUS | Status: DC | PRN
  Administered 2022-11-20: 3 g via INTRAVENOUS

## 2022-11-20 MED ORDER — MIDAZOLAM HCL 2 MG/2ML IJ SOLN
INTRAMUSCULAR | Status: AC
  Filled 2022-11-20: qty 2

## 2022-11-20 MED ORDER — FENTANYL CITRATE (PF) 100 MCG/2ML IJ SOLN
INTRAMUSCULAR | Status: AC
  Administered 2022-11-20: 100 ug via INTRAVENOUS
  Filled 2022-11-20: qty 2

## 2022-11-20 SURGICAL SUPPLY — 58 items
ADH SKN CLS APL DERMABOND .7 (GAUZE/BANDAGES/DRESSINGS) ×9
APL PRP STRL LF ISPRP CHG 10.5 (MISCELLANEOUS) ×3
APPLICATOR CHLORAPREP 10.5 ORG (MISCELLANEOUS) ×3 IMPLANT
APPLIER CLIP 11 MED OPEN (CLIP) ×9
APR CLP MED 11 20 MLT OPN (CLIP) ×9
BAG COUNTER SPONGE SURGICOUNT (BAG) ×3 IMPLANT
BAG SPNG CNTER NS LX DISP (BAG) ×3
BIOPATCH RED 1 DISK 7.0 (GAUZE/BANDAGES/DRESSINGS) IMPLANT
CLIP APPLIE 11 MED OPEN (CLIP) IMPLANT
COVER PROBE W GEL 5X96 (DRAPES) IMPLANT
COVER SURGICAL LIGHT HANDLE (MISCELLANEOUS) ×3 IMPLANT
DERMABOND ADVANCED .7 DNX12 (GAUZE/BANDAGES/DRESSINGS) ×3 IMPLANT
DRAIN CHANNEL 19F RND (DRAIN) IMPLANT
DRAIN RELI 100 BL SUC LF ST (DRAIN) ×3
DRAPE LAPAROTOMY 100X72 PEDS (DRAPES) ×3 IMPLANT
DRAPE ORTHO SPLIT 77X108 STRL (DRAPES) ×3
DRAPE SURG ORHT 6 SPLT 77X108 (DRAPES) IMPLANT
DRAPE TOP ARMCOVERS (MISCELLANEOUS) IMPLANT
DRAPE UTILITY XL STRL (DRAPES) IMPLANT
DRSG TEGADERM 4X4.5 CHG (GAUZE/BANDAGES/DRESSINGS) IMPLANT
ELECT BLADE 4.0 EZ CLEAN MEGAD (MISCELLANEOUS) ×3
ELECT CAUTERY BLADE 6.4 (BLADE) ×3 IMPLANT
ELECT REM PT RETURN 9FT ADLT (ELECTROSURGICAL) ×3
ELECTRODE BLDE 4.0 EZ CLN MEGD (MISCELLANEOUS) IMPLANT
ELECTRODE REM PT RTRN 9FT ADLT (ELECTROSURGICAL) ×3 IMPLANT
EVACUATOR SILICONE 100CC (DRAIN) IMPLANT
GAUZE 4X4 16PLY ~~LOC~~+RFID DBL (SPONGE) ×3 IMPLANT
GAUZE PAD ABD 8X10 STRL (GAUZE/BANDAGES/DRESSINGS) IMPLANT
GAUZE SPONGE 4X4 12PLY STRL (GAUZE/BANDAGES/DRESSINGS) IMPLANT
GLOVE BIO SURGEON STRL SZ7 (GLOVE) ×3 IMPLANT
GLOVE BIOGEL PI IND STRL 7.5 (GLOVE) ×3 IMPLANT
GOWN STRL REUS W/ TWL LRG LVL3 (GOWN DISPOSABLE) ×6 IMPLANT
GOWN STRL REUS W/TWL LRG LVL3 (GOWN DISPOSABLE) ×9
HEMOSTAT ARISTA ABSORB 3G PWDR (HEMOSTASIS) IMPLANT
KIT BASIN OR (CUSTOM PROCEDURE TRAY) ×3 IMPLANT
KIT MARKER MARGIN INK (KITS) IMPLANT
KIT TURNOVER KIT B (KITS) ×3 IMPLANT
NDL HYPO 25GX1X1/2 BEV (NEEDLE) ×3 IMPLANT
NEEDLE HYPO 25GX1X1/2 BEV (NEEDLE) ×3
NS IRRIG 1000ML POUR BTL (IV SOLUTION) ×3 IMPLANT
PACK GENERAL/GYN (CUSTOM PROCEDURE TRAY) ×3 IMPLANT
PAD ARMBOARD 7.5X6 YLW CONV (MISCELLANEOUS) ×6 IMPLANT
PENCIL SMOKE EVACUATOR (MISCELLANEOUS) ×3 IMPLANT
RETRACTOR ONETRAX LX 90X20 (MISCELLANEOUS) IMPLANT
SPIKE FLUID TRANSFER (MISCELLANEOUS) ×3 IMPLANT
STAPLER VISISTAT 35W (STAPLE) IMPLANT
STRIP CLOSURE SKIN 1/2X4 (GAUZE/BANDAGES/DRESSINGS) IMPLANT
SUT ETHILON 2 0 FS 18 (SUTURE) IMPLANT
SUT MNCRL AB 4-0 PS2 18 (SUTURE) ×3 IMPLANT
SUT MON AB 5-0 PS2 18 (SUTURE) IMPLANT
SUT VIC AB 2-0 SH 18 (SUTURE) IMPLANT
SUT VIC AB 2-0 SH 27 (SUTURE) ×9
SUT VIC AB 2-0 SH 27X BRD (SUTURE) IMPLANT
SUT VIC AB 3-0 SH 18 (SUTURE) IMPLANT
SUT VIC AB 3-0 SH 27 (SUTURE) ×6
SUT VIC AB 3-0 SH 27X BRD (SUTURE) ×3 IMPLANT
SYR CONTROL 10ML LL (SYRINGE) ×3 IMPLANT
TOWEL GREEN STERILE (TOWEL DISPOSABLE) ×3 IMPLANT

## 2022-11-20 NOTE — Anesthesia Procedure Notes (Signed)
Anesthesia Procedure Image    

## 2022-11-20 NOTE — Discharge Instructions (Signed)
[facility] Office Phone Number [phone]  POST OP INSTRUCTIONS Take 400 mg of ibuprofen every 8 hours or 650 mg tylenol every 6 hours for next 72 hours then as needed. Use ice several times daily also.  A prescription for pain medication may be given to you upon discharge.  Take your pain medication as prescribed, if needed.  If narcotic pain medicine is not needed, then you may take acetaminophen (Tylenol), naprosyn (Alleve) or ibuprofen (Advil) as needed. Take your usually prescribed medications unless otherwise directed If you need a refill on your pain medication, please contact your pharmacy.  They will contact our office to request authorization.  Prescriptions will not be filled after 5pm or on week-ends. You should eat very light the first 24 hours after surgery, such as soup, crackers, pudding, etc.  Resume your normal diet the day after surgery. Most patients will experience some swelling and bruising in the breast.  Ice packs and a good support bra will help.  Wear the breast binder provided or a sports bra for 72 hours day and night.  After that wear a sports bra during the day until you return to the office. Swelling and bruising can take several days to resolve.  It is common to experience some constipation if taking pain medication after surgery.  Increasing fluid intake and taking a stool softener will usually help or prevent this problem from occurring.  A mild laxative (Milk of Magnesia or Miralax) should be taken according to package directions if there are no bowel movements after 48 hours. I used skin glue on the incision, you may shower in 24 hours.  The glue will flake off over the next 2-3 weeks.  Any sutures or staples will be removed at the office during your follow-up visit. ACTIVITIES:  You may resume regular daily activities (gradually increasing) beginning the next day.  Wearing a good support bra or sports bra minimizes pain and swelling.  You may have  sexual intercourse when it is comfortable. You may drive when you no longer are taking prescription pain medication, you can comfortably wear a seatbelt, and you can safely maneuver your car and apply brakes. RETURN TO WORK:  ______________________________________________________________________________________ You should see your doctor in the office for a follow-up appointment approximately two weeks after your surgery.  Your doctor's nurse will typically make your follow-up appointment when she calls you with your pathology report.  Expect your pathology report 3-4 business days after your surgery.  You may call to check if you do not hear from us after three days. OTHER INSTRUCTIONS: _______________________________________________________________________________________________ _____________________________________________________________________________________________________________________________________ _____________________________________________________________________________________________________________________________________ _____________________________________________________________________________________________________________________________________  WHEN TO CALL DR WAKEFIELD: Fever over 101.0 Nausea and/or vomiting. Extreme swelling or bruising. Continued bleeding from incision. Increased pain, redness, or drainage from the incision.  The clinic staff is available to answer your questions during regular business hours.  Please don't hesitate to call and ask to speak to one of the nurses for clinical concerns.  If you have a medical emergency, go to the nearest emergency room or call 911.  A surgeon from [facility] is always on call at the hospital.  For further questions, please visit [email or web address]  

## 2022-11-20 NOTE — Anesthesia Postprocedure Evaluation (Signed)
Anesthesia Post Note  Patient: Livi Aime  Procedure(s) Performed: LEFT BREAST LUMPECTOMY WITH RADIOACTIVE SEED (Left: Breast) REMOVAL PORT-A-CATH (Right: Chest) LEFT AXILLARY DISSECTION (Left: Axilla)     Patient location during evaluation: PACU Anesthesia Type: General Level of consciousness: awake and alert Pain management: pain level controlled Vital Signs Assessment: post-procedure vital signs reviewed and stable Respiratory status: spontaneous breathing, nonlabored ventilation, respiratory function stable and patient connected to nasal cannula oxygen Cardiovascular status: blood pressure returned to baseline and stable Postop Assessment: no apparent nausea or vomiting Anesthetic complications: yes  Encounter Notable Events  Notable Event Outcome Phase Comment  Difficult to intubate - expected  Intraprocedure Filed from anesthesia note documentation.    Last Vitals:  Vitals:   11/20/22 1230 11/20/22 1245  BP: (!) 149/90 (!) 145/92  Pulse: 92 91  Resp: 12 18  Temp:    SpO2: 100% 98%    Last Pain:  Vitals:   11/20/22 1215  TempSrc:   PainSc: 5                  Kirstan Fentress S

## 2022-11-20 NOTE — Transfer of Care (Signed)
Immediate Anesthesia Transfer of Care Note  Patient: Lynn [name]  Procedure(s) Performed: LEFT BREAST LUMPECTOMY WITH RADIOACTIVE SEED (Left: Breast) REMOVAL PORT-A-CATH (Right: Chest) LEFT AXILLARY DISSECTION (Left: Axilla)  Patient Location: PACU  Anesthesia Type:General  Level of Consciousness: drowsy and patient cooperative  Airway & Oxygen Therapy: Patient Spontanous Breathing and Patient connected to face mask oxygen  Post-op Assessment: Report given to RN, Post -op Vital signs reviewed and stable, and Patient moving all extremities X 4  Post vital signs: Reviewed and stable  Last Vitals:  Vitals Value Taken Time  BP 122/77 11/20/22 1115  Temp    Pulse 84 11/20/22 1118  Resp 22 11/20/22 1118  SpO2 93 % 11/20/22 1118  Vitals shown include unfiled device data.  Last Pain:  Vitals:   11/20/22 0843  TempSrc:   PainSc: 0-No pain      Patients Stated Pain Goal: 0 (11/20/22 0843)  Complications:  Encounter Notable Events  Notable Event Outcome Phase Comment  Difficult to intubate - expected  Intraprocedure Filed from anesthesia note documentation.

## 2022-11-20 NOTE — Anesthesia Preprocedure Evaluation (Signed)
Anesthesia Evaluation  Patient identified by MRN, date of birth, ID band Patient awake    Reviewed: Allergy & Precautions, H&P , NPO status , Patient's Chart, lab work & pertinent test results  Airway Mallampati: III  TM Distance: <3 FB Neck ROM: Full    Dental no notable dental hx.    Pulmonary neg pulmonary ROS, former smoker   Pulmonary exam normal breath sounds clear to auscultation       Cardiovascular hypertension, Normal cardiovascular exam Rhythm:Regular Rate:Normal  1. Left ventricular ejection fraction, by estimation, is 60 to 65%. The  left ventricle has normal function. The left ventricle has no regional  wall motion abnormalities. There is mild left ventricular hypertrophy.  Left ventricular diastolic parameters  were normal. The average left ventricular global longitudinal strain is  -16.6 %. The global longitudinal strain is normal.   2. Right ventricular systolic function is normal. The right ventricular  size is normal.   3. The mitral valve is normal in structure. No evidence of mitral valve  regurgitation. No evidence of mitral stenosis.   4. The aortic valve is normal in structure. Aortic valve regurgitation is  not visualized. No aortic stenosis is present.   5. The inferior vena cava is normal in size with greater than 50%  respiratory variability, suggesting right atrial pressure of 3 mmHg.     Neuro/Psych   Anxiety Depression    negative neurological ROS     GI/Hepatic Neg liver ROS,GERD  ,,  Endo/Other  diabetes  Morbid obesity  Renal/GU negative Renal ROS  negative genitourinary   Musculoskeletal negative musculoskeletal ROS (+)    Abdominal   Peds negative pediatric ROS (+)  Hematology negative hematology ROS (+)   Anesthesia Other Findings   Reproductive/Obstetrics negative OB ROS                             Anesthesia Physical Anesthesia Plan  ASA:  4  Anesthesia Plan: General   Post-op Pain Management: Regional block*   Induction: Intravenous  PONV Risk Score and Plan: 3 and Ondansetron, Dexamethasone, Midazolam and Treatment may vary due to age or medical condition  Airway Management Planned: Oral ETT and Video Laryngoscope Planned  Additional Equipment:   Intra-op Plan:   Post-operative Plan: Extubation in OR  Informed Consent: I have reviewed the patients History and Physical, chart, labs and discussed the procedure including the risks, benefits and alternatives for the proposed anesthesia with the patient or authorized representative who has indicated his/her understanding and acceptance.     Dental advisory given  Plan Discussed with: CRNA and Surgeon  Anesthesia Plan Comments:        Anesthesia Quick Evaluation

## 2022-11-20 NOTE — Anesthesia Procedure Notes (Signed)
Anesthesia Regional Block: Pectoralis block   Pre-Anesthetic Checklist: , timeout performed,  Correct Patient, Correct Site, Correct Laterality,  Correct Procedure, Correct Position, site marked,  Risks and benefits discussed,  Surgical consent,  Pre-op evaluation,  At surgeon's request and post-op pain management  Laterality: Left  Prep: chloraprep       Needles:  Injection technique: Single-shot  Needle Type: Echogenic Needle     Needle Length: 9cm      Additional Needles:   Procedures:,,,, ultrasound used (permanent image in chart),,    Narrative:  Start time: 11/20/2022 8:20 AM End time: 11/20/2022 8:31 AM Injection made incrementally with aspirations every 5 mL.  Performed by: Personally  Anesthesiologist: Eilene Ghazi, MD  Additional Notes: Patient tolerated the procedure well without complications

## 2022-11-20 NOTE — Op Note (Signed)
Preoperative diagnosis: Left breast cancer status post primary chemotherapy Postoperative diagnosis: Same as above Procedure: 1.  Right internal jugular port removal 2.  Left breast radioactive seed bracketed lumpectomy 3.  Left axillary lymph node dissection Surgeon: Dr. Harden Mo Anesthesia: General With a pectoral block Estimated blood loss: 75 cc Specimens: 1.  Left breast tissue containing all of the clips and the seeds marked with paint 2.  Left axillary lymph nodes, deep Complications: Drains: 19 French Blake drain to left axilla Sponge needle count was correct completion Disposition recovery stable condition  Indications: This is a 37 year old female who presented earlier this year with a breast mass.  She had an ultrasound and a mammogram that showed a total area about 5 cm.  There were numerous over 10 lymph nodes that were abnormal in her axilla.  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  She underwent primary systemic therapy.  Her repeat MRI showed complete response in the breast and her nodes.  We discussed proceeding with a bracketed lumpectomy as well as a lymph node dissection due to the number node was positive in her age.  Oncology also stated she was okay for port removal which she desired.  Procedure: After informed consent was obtained she first underwent a pectoral block.  SCDs were on.  Antibiotics were given.  She was then placed under general anesthesia without complication.  She was prepped and draped in a standard sterile surgical fashion.  Surgical timeout was then performed.  I first reentered my old port incision in the right chest.  I remove the port and the line as well as the suture material in their entirety.  I then obtained hemostasis.  This was closed with 3-0 Vicryl for Monocryl and glue.  I then did the lumpectomy.  I made a superior periareolar incision in order to hide the scar later.  I then was able to remove all 3 seeds and all 3  clips in 1 lumpectomy specimen.  One of the seeds was at the edge so I did remove this separately.  Mammogram confirmed this.  I then obtained hemostasis.  I placed some clips in the cavity.  I closed down the cavity with 2-0 Vicryl.  The skin was closed with 3-0 Vicryl 5-0 Monocryl.  Glue and Steri-Strips were eventually applied.  I then made an incision below her axillary hairline.  I carried this through the subcutaneous tissue and the axillary fascia.  I was able to identify the pectoralis muscle.  This was somewhat difficult due to her body habitus.  I then was able to identify the axillary vein.  She had a few lymph nodes that were still palpable.  I swept this tissue caudad using multiple clips for some small veins.  I was able to identify the long thoracic nerve and the thoracodorsal bundle was deep to me.  I then removed all of this axillary tissue and passed this off the table.  There were no other palpable nodes present.  I did place some Arista in the cavity.  I placed a 106 [place] Blake drain and secured this with a 2-0 nylon suture.  I then closed the axillary fascia with 2-0 Vicryl.  The skin was closed with 3-0 Vicryl for Monocryl.  Glue and Steri-Strips applied.  She tolerated this well and was transferred recovery stable.

## 2022-11-20 NOTE — Anesthesia Procedure Notes (Addendum)
Procedure Name: Intubation Date/Time: 11/20/2022 9:08 AM  Performed by: Alease Medina, CRNAPre-anesthesia Checklist: Patient identified, Emergency Drugs available, Suction available and Patient being monitored Patient Re-evaluated:Patient Re-evaluated prior to induction Oxygen Delivery Method: Circle system utilized Preoxygenation: Pre-oxygenation with 100% oxygen Induction Type: IV induction Ventilation: Two handed mask ventilation required Laryngoscope Size: Glidescope and 3 Grade View: Grade I Tube type: Oral Tube size: 7.0 mm Number of attempts: 1 Airway Equipment and Method: Stylet Placement Confirmation: ETT inserted through vocal cords under direct vision, positive ETCO2 and breath sounds checked- equal and bilateral Secured at: 21 cm Tube secured with: Tape Dental Injury: Teeth and Oropharynx as per pre-operative assessment  Difficulty Due To: Difficulty was anticipated and Difficult Airway- due to limited oral opening

## 2022-11-20 NOTE — H&P (Signed)
36 yof with no prior history presents with skin changes and left breast mass. She has noted for 1.5 months. She had mm and [place] that shows in uoq a 7x6x5 mm mass and a 3x2x1.3 cm mass (total this area is about 5 cm). There are 5 abnormal nodes present as well. Biopsy of node is positive. Biopsy of breast masses is hg IDC with DCIS with LVI that is er pos at 95, pr pos at 30, her 2 negative and Ki is 60%. She had mri initially also that shows area measuring 4.4x3.5x3x5 cm with over 10 abnormal nodes present. She did not have IBC on presentation. She had port placed and underwent chemotherapy with repeat mri that shows complete response in breast and nodes. 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 She is here to discuss options.  She has history of resting tachycardia that she has seen cards for and is on coreg. She has left rc tear but not much limit on rom now  Review of Systems: A complete review of systems was obtained from the patient. I have reviewed this information and discussed as appropriate with the patient. See HPI as well for other ROS.  Review of Systems  Musculoskeletal: Positive for neck pain.  Psychiatric/Behavioral: The patient is nervous/anxious.  All other systems reviewed and are negative.   Medical History: Past Medical History:  Diagnosis Date  Anemia  Chronic tachycardia 03/12/2019  Last Assessment & Plan: Restart coreg. Cont to monitor  Essential hypertension 03/12/2019  Last Assessment & Plan: BP elevated in setting of being off medication. Will reassess in 4-6 weeks after restarting coreg. If elevated or can tolerate will consider lisinopril for prevention.  Generalized anxiety disorder with panic attacks 03/12/2019  Last Assessment & Plan: Discussed starting fluoxetine as sister responding well to this and was having side effects to escitalopram. Start with low dose and titrate up. Return in 6 weeks - will do gad/phq-9 at next office visit.  GERD  (gastroesophageal reflux disease)  History of cancer  LVH (left ventricular hypertrophy) due to hypertensive disease, without heart failure 03/12/2019  Last Assessment & Plan: Monitor for worsening symptoms. On ECHO in 2018. 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 Encouraged taking medication and following diet. Cont to work on weight loss   Patient Active Problem List  Diagnosis  Chronic tachycardia  Essential hypertension  Generalized anxiety disorder with panic attacks  LVH (left ventricular hypertrophy) due to hypertensive disease, without heart failure  Mild episode of recurrent major depressive disorder (CMS-HCC)  Poorly controlled type 2 diabetes mellitus (CMS/HHS-HCC)  Depression, major, single episode, complete remission (CMS-HCC)  Malignant neoplasm of overlapping sites of left breast in female, estrogen receptor positive (CMS/HHS-HCC)  Family history of heart attack  Morbid obesity (CMS/HHS-HCC)  Vitamin D deficiency  Neck pain   History reviewed. No pertinent surgical history.   Allergies  Allergen Reactions  Ibuprofen Hives  Macrobid [Nitrofurantoin Monohyd/M-Cryst] Other (See Comments)  Nausea, Vomiting and SOB  Nitrofurantoin Macrocrystal Nausea And Vomiting and Shortness Of Breath   Current Outpatient Medications on File Prior to Visit  Medication Sig Dispense Refill  acetaminophen (TYLENOL) 500 MG tablet Take 1,000 mg by mouth every 6 (six) hours as needed for Pain  aspirin 325 MG EC tablet Take 325 mg by mouth  once daily  busPIRone (BUSPAR) 5 MG tablet Take 5 mg by mouth 2 (two) times daily as needed  carvediloL (COREG) 25 MG tablet Take 25 mg by mouth 2 (two) times daily with meals  cyclobenzaprine (FLEXERIL) 10 MG tablet Take 10 mg by mouth 3  (three) times daily as needed for Muscle spasms  DULoxetine (CYMBALTA) 30 MG DR capsule Take 30 mg by mouth once daily  ergocalciferol, vitamin D2, 1,250 mcg (50,000 unit) capsule Take 50,000 Units by mouth as directed Take 1 capsule (50,000) by mouth every seven (7) days  famotidine (PEPCID) 20 MG tablet Take 20 mg by mouth 2 (two) times daily  OZEMPIC 0.25 mg or 0.5 mg (2 mg/3 mL) pen injector Inject 0.25-0.5 mg subcutaneously once a week INJECT 0.25 MG INTO SKIN FOR 4 WEEKS THEN INCREASE TO 0.5 MG WEEKL   No current facility-administered medications on file prior to visit.   History reviewed. No pertinent family history.   Social History   Tobacco Use  Smoking Status Never  Smokeless Tobacco Never  Marital status: Single  Tobacco Use  Smoking status: Never  Smokeless tobacco: Never   Objective:   Vitals:  11/02/22 1034  BP: 128/82  Pulse: (!) 122  Weight: (!) 153.3 kg (338 lb)  Height: 162.6 cm (5\' 4" )   Body mass index is 58.02 kg/m.  Physical Exam Vitals reviewed.  Constitutional:  Appearance: Normal appearance.  Eyes:  General: No scleral icterus. Cardiovascular:  Rate and Rhythm: Normal rate.  Pulmonary:  Effort: Pulmonary effort is normal.  Chest:  Breasts: Right: No inverted nipple, mass or nipple discharge.  Left: No inverted nipple, mass or nipple discharge.  Lymphadenopathy:  Upper Body:  Right upper body: No supraclavicular or axillary adenopathy.  Left upper body: No supraclavicular or axillary adenopathy.  Neurological:  Mental Status: She is alert.     Assessment and Plan:   Malignant neoplasm of overlapping sites of left breast in female, estrogen receptor positive (CMS/HHS-HCC)  Left breast seed bracketed lumpectomy, left axillary dissection, port removal  I think due to the fact that she had over 10 abnormal lymph nodes and her young age an axillary dissection is indicated. We discussed the surgery, use of the drain, as well as recovery  and the risk of lymphedema associated with it. We discussed the other risks including nerve injury. I will plan on keeping her overnight after surgery due to this.  We discussed lumpectomy versus mastectomy. I think it is reasonable if she would desire proceeding with a lumpectomy for this. This would be a seed bracketed lumpectomy and I can discuss this with radiology. We discussed the positive margin risk. She understands that she will need radiation after lumpectomy. She understands also that if she had a mastectomy she would need radiation after that as well. I will plan on scheduling her in the next couple of weeks.

## 2022-11-21 ENCOUNTER — Other Ambulatory Visit: Payer: Self-pay | Admitting: Nurse Practitioner

## 2022-11-21 ENCOUNTER — Encounter (HOSPITAL_COMMUNITY): Payer: Self-pay | Admitting: General Surgery

## 2022-11-21 DIAGNOSIS — C50812 Malignant neoplasm of overlapping sites of left female breast: Secondary | ICD-10-CM | POA: Diagnosis not present

## 2022-11-21 LAB — SURGICAL PATHOLOGY

## 2022-11-21 LAB — GLUCOSE, CAPILLARY: Glucose-Capillary: 137 mg/dL — ABNORMAL HIGH (ref 70–99)

## 2022-11-21 MED ORDER — TRAMADOL HCL 50 MG PO TABS: 50.0000 mg | ORAL_TABLET | Freq: Four times a day (QID) | ORAL | 0 refills | Status: DC | PRN

## 2022-11-21 MED ORDER — METHOCARBAMOL 750 MG PO TABS
750.0000 mg | ORAL_TABLET | Freq: Four times a day (QID) | ORAL | 0 refills | Status: DC | PRN
Start: 2022-11-21 — End: 2023-03-20

## 2022-11-21 NOTE — Discharge Summary (Signed)
Physician Discharge Summary  Patient ID: Lynn [name] MRN: [id number] DOB/AGE: 1985-05-13 37 y.o.  Admit date: 11/20/2022 Discharge date: 11/21/2022  Admission Diagnoses: Breast cancer s/p chemotherapy Diabetes Genella Rife htn Discharge Diagnoses:  Principal Problem:   Breast cancer [facility])   Discharged Condition: good  Hospital Course: 6 yof s/p primary chemotherapy followed by port removal, left alnd and left seed bracketed lumpectomy. Doing well following am. Some nausea but resolved.  Has appropriate drain output. Pain fair control which is why I think heart rate up a little this am as well as getting up. No dizziness and no evidence of bleeding.   Consults: None  Significant Diagnostic Studies: none  Treatments: surgery: left breast seed bracketed lumpectomy, left alnd, port removal  Discharge Exam: Blood pressure 108/65, pulse (!) 102, temperature 98.3 F (36.8 C), temperature source Oral, resp. rate 16, height 5\' 7"  (1.702 m), weight (!) 152 kg, last menstrual period 05/03/2022, SpO2 97%. Port removal site without hematoma. Alnd site without hematoma, expected drain output  Disposition: Discharge disposition: 01-Home or Self Care        Allergies as of 11/21/2022       Reactions   Macrobid [nitrofurantoin Macrocrystal] Shortness Of Breath, Nausea And Vomiting   Ibuprofen Hives        Medication List     TAKE these medications    Accu-Chek Aviva Plus test strip Generic drug: glucose blood Use as instructed   Accu-Chek Aviva Plus w/Device Kit 1 each by Does not apply route daily at 6 (six) AM.   ALPRAZolam 0.5 MG tablet Commonly known as: XANAX Take 1 tablet (0.5 mg total) by mouth at bedtime as needed for anxiety.   aspirin EC 325 MG tablet Take 325 mg by mouth in the morning.   aspirin-acetaminophen-caffeine 250-250-65 MG tablet Commonly known as: EXCEDRIN MIGRAINE Take 1-2 tablets by mouth 2 (two) times daily as needed for headache or migraine.    carvedilol 25 MG tablet Commonly known as: COREG Take 1 tablet (25 mg total) by mouth 2 (two) times daily with a meal.   cyclobenzaprine 10 MG tablet Commonly known as: FLEXERIL Take 1 tablet (10 mg total) by mouth 3 (three) times daily as needed for muscle spasms.   famotidine 20 MG tablet Commonly known as: PEPCID TAKE 1 TABLET(20 MG) BY MOUTH TWICE DAILY   lidocaine 2 % jelly Commonly known as: XYLOCAINE Apply 1 Application topically as needed.   lidocaine-prilocaine cream Commonly known as: EMLA Apply to affected area once   magic mouthwash (lidocaine, diphenhydrAMINE, alum & mag hydroxide) suspension Take 5 mLs by mouth 3 (three) times daily as needed for mouth pain.   methocarbamol 750 MG tablet Commonly known as: ROBAXIN Take 1 tablet (750 mg total) by mouth 4 (four) times daily as needed (use for muscle cramps/pain).   ondansetron 8 MG tablet Commonly known as: Zofran Take 1 tab (8 mg) by mouth every 8 hrs as needed for nausea/vomiting. Start third day after doxorubicin/cyclophosphamide chemotherapy.   ONE TOUCH CLUB LANCETS Misc 1 each by Does not apply route daily.   Ozempic (0.25 or 0.5 MG/DOSE) 2 MG/3ML Sopn Generic drug: Semaglutide(0.25 or 0.5MG /DOS) Inject 0.25 mg into the skin once a week. What changed: how much to take   prochlorperazine 10 MG tablet Commonly known as: COMPAZINE Take 1 tablet (10 mg total) by mouth every 6 (six) hours as needed for nausea or vomiting.   traMADol 50 MG tablet Commonly known as: ULTRAM Take 1 tablet (50 mg  total) by mouth every 6 (six) hours as needed.        Follow-up Information     Emelia Loron, MD Follow up in 2 week(s).   Specialty: General Surgery Contact information: [address] [place] [address] [phone]                 Signed: Emelia Loron 11/21/2022, 8:21 AM

## 2022-11-22 ENCOUNTER — Telehealth: Payer: Self-pay

## 2022-11-22 NOTE — Transitions of Care (Post Inpatient/ED Visit) (Signed)
   11/22/2022  Name: Lynn [name] MRN: [id number] DOB: 04-27-1985  Today's TOC FU Call Status: Today's TOC FU Call Status:: Unsuccessful Call (1st Attempt) Unsuccessful Call (1st Attempt) Date: 11/22/22  Attempted to reach the patient regarding the most recent Inpatient/ED visit.  Follow Up Plan: Additional outreach attempts will be made to reach the patient to complete the Transitions of Care (Post Inpatient/ED visit) call.   Signature   Woodfin Ganja LPN [facility] Nurse Health Advisor Direct Dial [phone]

## 2022-11-26 NOTE — Transitions of Care (Post Inpatient/ED Visit) (Signed)
   11/26/2022  Name: Lynn [name] MRN: [id number] DOB: Mar 16, 1986  Today's TOC FU Call Status: Today's TOC FU Call Status:: Unsuccessful Call (2nd Attempt) Unsuccessful Call (1st Attempt) Date: 11/22/22 Unsuccessful Call (2nd Attempt) Date: 11/26/22  Attempted to reach the patient regarding the most recent Inpatient/ED visit.  Follow Up Plan: No further outreach attempts will be made at this time. We have been unable to contact the patient.  Signature   Woodfin Ganja LPN [facility] Nurse Health Advisor Direct Dial [phone]

## 2022-11-27 ENCOUNTER — Telehealth: Payer: Self-pay | Admitting: *Deleted

## 2022-11-27 ENCOUNTER — Encounter: Payer: Self-pay | Admitting: *Deleted

## 2022-11-27 NOTE — Telephone Encounter (Signed)
This RN spoke with pt per her call stating she had to stay overnight after her lumpectomy due to vomiting. Since she has been discharged- she has had intermittent vomiting. Per phone discussion reviewed her medication list for antinausea meds- she states she only has compazine available but has not taken.  This RN informed her she may use it for benefit.  Pt stated appreciation of discussion and recommendation.

## 2022-11-28 ENCOUNTER — Other Ambulatory Visit: Payer: Self-pay | Admitting: Nurse Practitioner

## 2022-11-28 DIAGNOSIS — I10 Essential (primary) hypertension: Secondary | ICD-10-CM

## 2022-11-28 DIAGNOSIS — R000 Tachycardia, unspecified: Secondary | ICD-10-CM

## 2022-11-29 ENCOUNTER — Inpatient Hospital Stay: Payer: Medicaid Other | Attending: Hematology and Oncology | Admitting: Hematology and Oncology

## 2022-11-29 VITALS — BP 151/73 | HR 95 | Temp 97.3°F | Resp 18 | Ht 67.0 in | Wt 333.6 lb

## 2022-11-29 DIAGNOSIS — C50812 Malignant neoplasm of overlapping sites of left female breast: Secondary | ICD-10-CM | POA: Diagnosis not present

## 2022-11-29 DIAGNOSIS — Z170 Estrogen receptor positive status [ER+]: Secondary | ICD-10-CM | POA: Diagnosis not present

## 2022-11-29 NOTE — Telephone Encounter (Signed)
Patient needs an appointment

## 2022-11-29 NOTE — Assessment & Plan Note (Signed)
stage IIIB ER/PR positive breast cancer diagnosed in 06/2022 currently receiving neoadjuvant chemotherapy.   06/27/2022: CT CAP: Left breast cancer, 2 left axillary lymph nodes, left adnexal cystic lesion 4 cm    Treatment Plan:  1. Neoadjuvant chemotherapy with Adriamycin and Cytoxan dose dense 4 followed by Taxol weekly 10 completed 10/16/2022 2. 11/20/2022: Left lumpectomy: No residual cancer, margins negative, 0/14 lymph nodes 3. Followed by adjuvant radiation therapy 4.  Followed by antiestrogen therapy ____________________________________________________ Pathology counseling: I discussed the final pathology report of the patient provided  a copy of this report. I discussed the margins as well as lymph node surgeries. We also discussed the final staging along with previously performed ER/PR and HER-2/neu testing.  I discussed with her the pathological complete response is indicative of very good prognosis.  Follow-up after radiation therapy is completed

## 2022-11-29 NOTE — Telephone Encounter (Signed)
Requesting: CARVEDILOL 25MG  TABLETS  Last Visit: 06/11/2022 Next Visit: Visit date not found Last Refill: 06/11/2022  Please Advise

## 2022-11-29 NOTE — Progress Notes (Signed)
Patient Care Team: Gerre Scull, NP as PCP - General (Internal Medicine) Gweneth Dimitri, MD as Consulting Physician (Family Medicine) Gweneth Dimitri, MD as Consulting Physician (Family Medicine) Serena Croissant, MD as Consulting Physician (Hematology and Oncology) Emelia Loron, MD as Consulting Physician (General Surgery) Dorothy Puffer, MD as Consulting Physician (Radiation Oncology) Donnelly Angelica, RN as Oncology Nurse Navigator Pershing Proud, RN as Oncology Nurse Navigator  DIAGNOSIS:  Encounter Diagnosis  Name Primary?   Malignant neoplasm of overlapping sites of left breast in female, estrogen receptor positive (HCC) Yes    SUMMARY OF ONCOLOGIC HISTORY: Oncology History  Malignant neoplasm of overlapping sites of left breast in female, estrogen receptor positive (HCC)  05/29/2022 Initial Diagnosis   Palpable abnormalities in the left breast led to mammograms which revealed 3 masses 0.7 cm at 1 o'clock position, 3 cm at 2 o'clock position, additional nodule 8 mm total span 5 cm, 5 abnormal axillary lymph nodes biopsy of the masses and the lymph node were positive for grade 3 IDC with high-grade DCIS with LVI, ER 95%, PR 40%, Ki-67 60%, HER2 2+ by IHC FISH negative   06/15/2022 Cancer Staging   Staging form: Breast, AJCC 8th Edition - Clinical: Stage IIIB (cT3, cN3, cM0, G3, ER+, PR+, HER2-) - Signed by Loa Socks, NP on 06/15/2022 Stage prefix: Initial diagnosis Histologic grading system: 3 grade system   06/15/2022 Breast MRI   IMPRESSION: 1. Multiple sites of biopsy proven malignancy with in the lateral left breast. The overall conglomerate of suspicious findings spans approximately 4.4 x 3.5 x 3.5 cm, and contains all 3 post biopsy marking clips. 2. 10+ morphologically abnormal level I, II, III and Rotter's lymph nodes within the left axilla. 3. Diffuse skin and trabecular thickening on the left, consistent with lymphovascular spread of disease. 4.  No MRI evidence of malignancy on the right.   06/20/2022 -  Chemotherapy   Patient is on Treatment Plan : BREAST ADJUVANT DOSE DENSE AC q14d / PACLitaxel q7d      Genetic Testing   [facility] Multi-Cancer Panel+RNA was Negative. Report date is 06/13/2022.  The Multi-Cancer + RNA Panel offered by [facility] includes sequencing and/or deletion/duplication analysis of the following 70 genes:  AIP*, ALK, APC*, ATM*, AXIN2*, BAP1*, BARD1*, BLM*, BMPR1A*, BRCA1*, BRCA2*, BRIP1*, CDC73*, CDH1*, CDK4, CDKN1B*, CDKN2A, CHEK2*, CTNNA1*, DICER1*, EPCAM (del/dup only), EGFR, FH*, FLCN*, GREM1 (promoter dup only), HOXB13, KIT, LZTR1, MAX*, MBD4, MEN1*, MET, MITF, MLH1*, MSH2*, MSH3*, MSH6*, MUTYH*, NF1*, NF2*, NTHL1*, PALB2*, PDGFRA, PMS2*, POLD1*, POLE*, POT1*, PRKAR1A*, PTCH1*, PTEN*, RAD51C*, RAD51D*, RB1*, RET, SDHA* (sequencing only), SDHAF2*, SDHB*, SDHC*, SDHD*, SMAD4*, SMARCA4*, SMARCB1*, SMARCE1*, STK11*, SUFU*, TMEM127*, TP53*, TSC1*, TSC2*, VHL*. RNA analysis is performed for * genes.     CHIEF COMPLIANT: Post op  INTERVAL HISTORY: Lynn [name] is a 37 year old above-mentioned history of breast cancer. She presents to the clinic for a follow-up after surgery. Patient reports surgery went well. She says she does have some soreness.    ALLERGIES:  is allergic to macrobid [nitrofurantoin macrocrystal] and ibuprofen.  MEDICATIONS:  Current Outpatient Medications  Medication Sig Dispense Refill   ALPRAZolam (XANAX) 0.5 MG tablet Take 1 tablet (0.5 mg total) by mouth at bedtime as needed for anxiety. 30 tablet 1   aspirin EC 325 MG tablet Take 325 mg by mouth in the morning.     aspirin-acetaminophen-caffeine (EXCEDRIN MIGRAINE) 250-250-65 MG tablet Take 1-2 tablets by mouth 2 (two) times daily as needed for headache or migraine.  Blood Glucose Monitoring Suppl (ACCU-CHEK AVIVA PLUS) w/Device KIT 1 each by Does not apply route daily at 6 (six) AM. 1 kit 0   carvedilol (COREG) 25 MG tablet TAKE 1  TABLET(25 MG) BY MOUTH TWICE DAILY WITH A MEAL 180 tablet 0   cyclobenzaprine (FLEXERIL) 10 MG tablet Take 1 tablet (10 mg total) by mouth 3 (three) times daily as needed for muscle spasms. 30 tablet 1   famotidine (PEPCID) 20 MG tablet TAKE 1 TABLET(20 MG) BY MOUTH TWICE DAILY 180 tablet 0   glucose blood (ACCU-CHEK AVIVA PLUS) test strip Use as instructed 100 each 12   lidocaine (XYLOCAINE) 2 % jelly Apply 1 Application topically as needed. (Patient not taking: Reported on 11/09/2022) 85 g 0   lidocaine-prilocaine (EMLA) cream Apply to affected area once (Patient not taking: Reported on 11/29/2022) 30 g 3   magic mouthwash (lidocaine, diphenhydrAMINE, alum & mag hydroxide) suspension Take 5 mLs by mouth 3 (three) times daily as needed for mouth pain. (Patient not taking: Reported on 11/09/2022) 120 mL 0   methocarbamol (ROBAXIN) 750 MG tablet Take 1 tablet (750 mg total) by mouth 4 (four) times daily as needed (use for muscle cramps/pain). 30 tablet 0   ondansetron (ZOFRAN) 8 MG tablet Take 1 tab (8 mg) by mouth every 8 hrs as needed for nausea/vomiting. Start third day after doxorubicin/cyclophosphamide chemotherapy. (Patient not taking: Reported on 11/09/2022) 30 tablet 1   ONE TOUCH CLUB LANCETS MISC 1 each by Does not apply route daily. 100 each 11   prochlorperazine (COMPAZINE) 10 MG tablet Take 1 tablet (10 mg total) by mouth every 6 (six) hours as needed for nausea or vomiting. (Patient not taking: Reported on 11/09/2022) 30 tablet 1   Semaglutide,0.25 or 0.5MG /DOS, (OZEMPIC, 0.25 OR 0.5 MG/DOSE,) 2 MG/3ML SOPN Inject 0.5 mg into the skin once a week. 3 mL 2   traMADol (ULTRAM) 50 MG tablet Take 1 tablet (50 mg total) by mouth every 6 (six) hours as needed. 15 tablet 0   No current facility-administered medications for this visit.    PHYSICAL EXAMINATION: ECOG PERFORMANCE STATUS: 1 - Symptomatic but completely ambulatory  Vitals:   11/29/22 1042  BP: (!) 151/73  Pulse: 95  Resp: 18  Temp:  (!) 97.3 F (36.3 C)  SpO2: 97%   Filed Weights   11/29/22 1042  Weight: (!) 333 lb 9.6 oz (151.3 kg)      LABORATORY DATA:  I have reviewed the data as listed    Latest Ref Rng & Units 11/15/2022    8:22 AM 10/16/2022    8:13 AM 10/09/2022    1:23 PM  CMP  Glucose 70 - 99 mg/dL 657  846  962   BUN 6 - 20 mg/dL 12  15  13    Creatinine 0.44 - 1.00 mg/dL 9.52  8.41  3.24   Sodium 135 - 145 mmol/L 135  136  135   Potassium 3.5 - 5.1 mmol/L 4.1  4.3  4.3   Chloride 98 - 111 mmol/L 101  104  103   CO2 22 - 32 mmol/L 25  25  26    Calcium 8.9 - 10.3 mg/dL 8.5  8.9  8.8   Total Protein 6.5 - 8.1 g/dL  6.8  7.0   Total Bilirubin 0.3 - 1.2 mg/dL  0.5  0.6   Alkaline Phos 38 - 126 U/L  61  62   AST 15 - 41 U/L  17  21  ALT 0 - 44 U/L  22  27     Lab Results  Component Value Date   WBC 6.3 11/15/2022   HGB 11.4 (L) 11/15/2022   HCT 35.0 (L) 11/15/2022   MCV 84.3 11/15/2022   PLT 235 11/15/2022   NEUTROABS 4.3 10/16/2022    ASSESSMENT & PLAN:  Malignant neoplasm of overlapping sites of left breast in female, 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   06/27/2022: CT CAP: Left breast cancer, 2 left axillary lymph nodes, left adnexal cystic lesion 4 cm    Treatment Plan:  1. Neoadjuvant chemotherapy with Adriamycin and Cytoxan dose dense 4 followed by Taxol weekly 10 completed 10/16/2022 2. 11/20/2022: Left lumpectomy: No residual cancer, margins negative, 0/14 lymph nodes 3. Followed by adjuvant radiation therapy 4.  Followed by antiestrogen therapy ____________________________________________________ Pathology counseling: I discussed the final pathology report of the patient provided  a copy of this report. I discussed the margins as well as lymph node surgeries. We also discussed the final staging along with previously performed ER/PR and HER-2/neu testing.  I discussed with her the  pathological complete response is indicative of very good prognosis.  Follow-up after radiation therapy is completed    No orders of the defined types were placed in this encounter.  The patient has a good understanding of the overall plan. she agrees with it. she will call with any problems that may develop before the next visit here. Total time spent: 30 mins including face to face time and time spent for planning, charting and co-ordination of care   Tamsen Meek, MD 11/29/22  I have reviewed the above documentation for accuracy and completeness, and I agree with the above.

## 2022-12-11 NOTE — Therapy (Signed)
OUTPATIENT PHYSICAL THERAPY BREAST CANCER POST OP FOLLOW UP   Patient Name: Lynn [name] MRN: [id number] DOB:1985-11-25, 37 y.o., adult Today's Date: 12/12/2022  END OF SESSION:  PT End of Session - 12/12/22 1559     Visit Number 2    Number of Visits 10    Date for PT Re-Evaluation 01/09/23    PT Start Time 1600    PT Stop Time 1650    PT Time Calculation (min) 50 min    Activity Tolerance Patient tolerated treatment well    Behavior During Therapy [facility] for tasks assessed/performed             Past Medical History:  Diagnosis Date   Anemia    Cancer (HCC) 2024   L breast   Carpal tunnel syndrome, left    Depression    Diabetes (HCC)    Dysrhythmia    Palpitations   GERD (gastroesophageal reflux disease)    Hypertension    Iron deficiency    Panic attacks    PCOS (polycystic ovarian syndrome)    Pneumonia    Tachycardia    Umbilical hernia    Past Surgical History:  Procedure Laterality Date   ADENOIDECTOMY     As a child   AXILLARY LYMPH NODE DISSECTION Left 11/20/2022   Procedure: LEFT AXILLARY DISSECTION;  Surgeon: Emelia Loron, MD;  Location: [facility] OR;  Service: General;  Laterality: Left;   BREAST BIOPSY Left 05/29/2022   [place] LT BREAST BX W LOC DEV 1ST LESION IMG BX SPEC US GUIDE 05/29/2022 [facility]   BREAST BIOPSY Left 05/29/2022   [place] LT BREAST BX W LOC DEV EA ADD LESION IMG BX SPEC US GUIDE 05/29/2022 [facility]   BREAST BIOPSY Left 06/06/2022   MM LT BREAST BX W LOC DEV 1ST LESION IMAGE BX SPEC STEREO GUIDE 06/06/2022 [facility]   BREAST BIOPSY  11/19/2022   MM LT RADIOACTIVE SEED LOC MAMMO GUIDE 11/19/2022 [facility]   BREAST BIOPSY  11/19/2022   MM LT RADIOACTIVE SEED EA ADD LESION LOC MAMMO GUIDE 11/19/2022 [facility]   BREAST BIOPSY  11/19/2022   MM LT RADIOACTIVE SEED EA ADD LESION LOC MAMMO GUIDE 11/19/2022 [facility]   BREAST LUMPECTOMY WITH RADIOACTIVE SEED AND AXILLARY LYMPH NODE DISSECTION Left  11/20/2022   Procedure: LEFT BREAST LUMPECTOMY WITH RADIOACTIVE SEED;  Surgeon: Emelia Loron, MD;  Location: [facility] OR;  Service: General;  Laterality: Left;  PEC BLOCK   CESAREAN SECTION  2015   PORT-A-CATH REMOVAL Right 11/20/2022   Procedure: REMOVAL PORT-A-CATH;  Surgeon: Emelia Loron, MD;  Location: [facility] OR;  Service: General;  Laterality: Right;   PORTACATH PLACEMENT N/A 06/19/2022   Procedure: INSERTION PORT-A-CATH;  Surgeon: Emelia Loron, MD;  Location: [facility] OR;  Service: General;  Laterality: N/A;   TONSILLECTOMY     As a child   Patient Active Problem List   Diagnosis Date Noted   Breast cancer (HCC) 11/20/2022   Port-A-Cath in place 07/31/2022   Genetic testing 06/15/2022   Anxiety and depression 06/11/2022   Malignant neoplasm of overlapping sites of left breast in female, estrogen receptor positive (HCC) 06/04/2022   Syncope and collapse 03/12/2022   Umbilical hernia without obstruction and without gangrene 03/12/2022   Vitamin D deficiency 03/12/2022   Cervical radicular pain 12/07/2021   Snoring 12/07/2021   Other fatigue 12/07/2021   Chest pain 08/15/2021   Epigastric pain 08/15/2021   Acute pain of both shoulders 04/21/2021   Nerve pain 04/21/2021  Morbid obesity (HCC) 10/13/2020   Essential hypertension 03/12/2019   Chronic tachycardia 03/12/2019   Generalized anxiety disorder with panic attacks 03/12/2019   Family history of heart attack 03/12/2019   LVH (left ventricular hypertrophy) due to hypertensive disease, without heart failure 03/12/2019   Type 2 diabetes mellitus (HCC) 03/12/2019      REFERRING PROVIDER: Dr. Emelia Loron  REFERRING DIAG: Left Breast Cancer  THERAPY DIAG:  Malignant neoplasm of upper-outer quadrant of left breast in female, estrogen receptor positive (HCC)  Abnormal posture  Chronic left shoulder pain  Stiffness of left shoulder, not elsewhere classified  Muscle weakness (generalized)  Acute pain of left  shoulder  Rationale for Evaluation and Treatment: Rehabilitation  ONSET DATE: 05/21/2022  SUBJECTIVE:                                                                                                                                                                                           SUBJECTIVE STATEMENT: Pt  has not done too many of her exercises because they are painful in the shoulder and lateral trunk. She had the drain removed last week. She is working on getting her stamina back from her chemo  PERTINENT HISTORY:  Patient was diagnosed on 05/21/2022 with left grade 3 invasive ductal carcinoma breast cancer. It measures 3 cm and 7 mm (5 cm total) and is located in the upper outer quadrant. It is ER/PR positive and HER2 negative with a Ki67 of 60%. She has 5 abnormal appearing axillary lymph nodes and 1 was biopsied and is positive. She has a BMI > 50, hypertension, and diabetes. Left rotator cuff tear; needs surgery but is now postponed. Pt had neoadjuvant chemotherapy and she is s/p a Left lumpectomy with ALND and 0+/14 LN's. She will have radiation simulation on Sept 17, 2024  PATIENT GOALS:  Reassess how my recovery is going related to arm function, pain, and swelling.  PAIN:  Are you having pain? Yes: NPRS scale: 3-7/10 Pain location: left breast, and left shoulder Pain description: soreness, some sharpness Aggravating factors: reaching with left UE,  can't sleep on left side, Relieving factors: rest, muscle relaxer  PRECAUTIONS: Recent Surgery, right UE Lymphedema risk,  BMI > 50, hypertension, and diabetes. Left rotator cuff tear; needs surgery but is now postponed. RED FLAGS: None   ACTIVITY LEVEL / LEISURE: not back to her usual activities   OBJECTIVE:   PATIENT SURVEYS:  QUICK DASH: 38.64%  OBSERVATIONS: Incisions healing well, no visible left breast edema  POSTURE:  Forward head and rounded shoulders posture   LYMPHEDEMA ASSESSMENT:   UPPER EXTREMITY  AROM/PROM:   A/PROM  RIGHT   eval    Shoulder extension 50  Shoulder flexion 149  Shoulder abduction 135  Shoulder internal rotation 72  Shoulder external rotation 89                          (Blank rows = not tested)   A/PROM LEFT   eval LEFT  Shoulder extension 48 39  Shoulder flexion 141 and painful/tight 104  Shoulder abduction 156 85  Shoulder internal rotation 59 NT due to inability to abd 90  Shoulder external rotation 78 "   "                          (Blank rows = not tested)   CERVICAL AROM: All within normal limits   UPPER EXTREMITY STRENGTH: Not tested due to left rotator cuff tear   LYMPHEDEMA ASSESSMENTS:    LANDMARK RIGHT   eval RIGHT 9/11/20247.9  10 cm proximal to olecranon process 43.8 44.3  Olecranon process 33.5 34.7  10 cm proximal to ulnar styloid process 29.4 31.5  Just proximal to ulnar styloid process 20.3 21.4  Across hand at thumb web space 23.2 23.7  At base of 2nd digit 7.8 7.9  (Blank rows = not tested)   LANDMARK LEFT   eval LEFT 12/12/2022  10 cm proximal to olecranon process 41.3 44.2  Olecranon process 32.4 33.9  10 cm proximal to ulnar styloid process 28 29.2  Just proximal to ulnar styloid process 20.8 21.1  Across hand at thumb web space 22.7 23.2  At base of 2nd digit 7.6 7.7  (Blank rows = not tested)    Surgery type/Date: 11/20/2022, Left lumpectomy with ALND Number of lymph nodes removed: 0+/14 Current/past treatment (chemo, radiation, hormone therapy): had neoadjuvant chemotherapy, pending radiation Other symptoms:  Heaviness/tightness Yes, lateral trunk Pain Yes Pitting edema No Infections No Decreased scar mobility Yes Stemmer sign No  PATIENT EDUCATION:  Education details: ABC class, SOZO screens, need for compression sleeve, HEP review with flexion and stargazer in supine, standing wall slides. 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  She is pending radiation.She has had a lot of pain at the left lateral trunk and axilla and difficulty doing her exercises due to pain. She has limited A/PROM of the left shoulder, which is know to have a prior RTC tear that has not yet had surgery. She was re-educated in her post op exercises and performed supine flexion and stargazer with good tolerance, and standing wall slides. There is sensitivity to the axilla possibly from the drain and surgery or from cording, however no cording is visualized or palpable. She will benefit from skilled PT to address deficits and return to PLOF. She will also benefit from a prophylactic compression sleeve due to the large number of LN's she had removed.  Pt will benefit from skilled therapeutic intervention to improve on the following deficits: Decreased knowledge of precautions, impaired UE functional use, pain, decreased ROM, postural dysfunction.   PT treatment/interventions:  ADL/Self care home management, Therapeutic exercises, Therapeutic activity, Neuromuscular re-education, Patient/Family education, Self Care, Joint mobilization, Orthotic/Fit training, scar mobilization, Manual therapy, and Re-evaluation   GOALS: Goals reviewed with patient? Yes  LONG TERM GOALS:  (STG=LTG)  GOALS Name Target Date  Goal status  1 Pt will demonstrate she has regained full shoulder ROM and function post operatively compared to baselines.  Baseline: 01/09/2023 INITIAL  2 Pt will have 40-50  % or greater reduction in pain in lateral trunk, and left breast r 01/09/2023 INITIAL  3 Pt will be fit for appropriate Prophylactic Left UE compression garment and will be independent in donning/doffing 01/09/2023 INITIAL  4 Quick dash will be no greater than 18- 20% to demonstrate improved function 01/09/2023 INITIAL     PLAN:  PT FREQUENCY/DURATION: 2x/week x 4 weeks  PLAN FOR NEXT SESSION: have pt measured for custom sleeve, review HEP, PROM activities, STM prn, gentle strength/stability   [facility]  [address], [address]  [place] [address]  [phone]  After Breast Cancer Class It is recommended you attend the ABC class to be educated on lymphedema risk reduction. This class is free of charge and lasts for 1 hour. It is a 1-time class. You will need to download the TEAMS app either on your phone or computer. We will send you a link the night before or the morning of the class. You should be able to click on that link to join the class. This is not a confidential class. You don't have to turn your camera on, but other participants may be able to see your email address.  Scar massage You can begin gentle scar massage to you incision sites. Gently place one hand on the incision and move the skin (without sliding on the skin) in various directions. Do this for a few minutes and then you can gently massage either coconut oil or vitamin E cream into the scars.  Compression garment You should continue wearing your compression bra until you feel like you no longer have swelling.  Home exercise Program Continue doing the exercises you were given until you feel like you can do them without feeling any tightness at the end.   Walking Program Studies show that 30 minutes of walking per day (fast enough to elevate your heart rate) can significantly reduce the risk of a cancer recurrence. If you can't walk due to other medical reasons, we encourage you to find  another activity you could do (like a stationary bike or water exercise).  Posture After breast cancer surgery, people frequently sit with rounded shoulders posture because it puts their incisions on slack and feels better. If you sit like this and scar tissue forms in that position, you can become very tight and have pain sitting or standing with good posture. Try to be aware of your posture and sit and stand up tall to heal properly.  Follow up PT: It is recommended you return every 3 months for the first 3 years following surgery to be assessed on the SOZO machine for an L-Dex score. This helps prevent clinically significant lymphedema in 95% of patients. These follow up screens are 10 minute appointments that you are not billed for.  Waynette Buttery, PT 12/12/2022, 4:57 PM

## 2022-12-12 ENCOUNTER — Ambulatory Visit: Payer: Medicaid Other | Attending: General Surgery

## 2022-12-12 DIAGNOSIS — M6281 Muscle weakness (generalized): Secondary | ICD-10-CM | POA: Insufficient documentation

## 2022-12-12 DIAGNOSIS — G8929 Other chronic pain: Secondary | ICD-10-CM | POA: Diagnosis not present

## 2022-12-12 DIAGNOSIS — M25512 Pain in left shoulder: Secondary | ICD-10-CM | POA: Insufficient documentation

## 2022-12-12 DIAGNOSIS — M25612 Stiffness of left shoulder, not elsewhere classified: Secondary | ICD-10-CM | POA: Diagnosis not present

## 2022-12-12 DIAGNOSIS — R293 Abnormal posture: Secondary | ICD-10-CM | POA: Insufficient documentation

## 2022-12-12 DIAGNOSIS — C50412 Malignant neoplasm of upper-outer quadrant of left female breast: Secondary | ICD-10-CM | POA: Insufficient documentation

## 2022-12-12 DIAGNOSIS — Z170 Estrogen receptor positive status [ER+]: Secondary | ICD-10-CM | POA: Insufficient documentation

## 2022-12-12 NOTE — Patient Instructions (Addendum)
     [facility]  [address]  [place] [address]  [phone]  After Breast Cancer Class It is recommended you attend the ABC class to be educated on lymphedema risk reduction. This class is free of charge and lasts for 1 hour. It is a 1-time class. You will need to download the TEAMS app either on your phone or computer. We will send you a link the night before or the morning of the class. You should be able to click on that link to join the class. This is not a confidential class. You don't have to turn your camera on, but other participants may be able to see your email address.  Scar massage You can begin gentle scar massage to you incision sites. Gently place one hand on the incision and move the skin (without sliding on the skin) in various directions. Do this for a few minutes and then you can gently massage either coconut oil or vitamin E cream into the scars.  Compression garment You should continue wearing your compression bra until you feel like you no longer have swelling.  Home exercise Program Continue doing the exercises you were given until you feel like you can do them without feeling any tightness at the end.   Walking Program Studies show that 30 minutes of walking per day (fast enough to elevate your heart rate) can significantly reduce the risk of a cancer recurrence. If you can't walk due to other medical reasons, we encourage you to find another activity you could do (like a stationary bike or water exercise).  Posture After breast cancer surgery, people frequently sit with rounded shoulders posture because it puts their incisions on slack and feels better. If you sit like this and scar tissue forms in that position, you can become very tight and have pain sitting or standing with good posture. Try to be aware of your posture and sit and stand up tall to heal properly.  Follow up PT: It is recommended you return every 3 months for  the first 2 years following surgery to be assessed on the SOZO machine for an L-Dex score. This helps prevent clinically significant lymphedema in 95% of patients. These follow up screens are 10 minute appointments that you are not billed for.

## 2022-12-14 ENCOUNTER — Encounter: Payer: Self-pay | Admitting: Hematology and Oncology

## 2022-12-14 ENCOUNTER — Other Ambulatory Visit: Payer: Self-pay

## 2022-12-14 ENCOUNTER — Encounter: Payer: Self-pay | Admitting: Radiation Oncology

## 2022-12-14 NOTE — Progress Notes (Signed)
Nursing interview for Malignant neoplasm of overlapping sites of left breast in female, estrogen receptor positive (HCC). Patient identity verified x2.  Patient reports LT breast tenderness 5/10 at incision line, but is otherwise well. No other issues conveyed at this time.  Meaningful use complete. LMP-05/16/2022- (Due to CHEMO) Pt. States no changes of pregnancy.   Vitals- Nursing note completed by phone. See additional note/chart for current vitals.  This concludes the interaction.  Ruel Favors, LPN

## 2022-12-18 ENCOUNTER — Ambulatory Visit: Payer: Medicaid Other | Admitting: Radiation Oncology

## 2022-12-18 ENCOUNTER — Ambulatory Visit: Admission: RE | Admit: 2022-12-18 | Payer: Managed Care, Other (non HMO) | Source: Ambulatory Visit

## 2022-12-18 ENCOUNTER — Ambulatory Visit
Admission: RE | Admit: 2022-12-18 | Discharge: 2022-12-18 | Disposition: A | Discharge status: Home / Self Care | Payer: Managed Care, Other (non HMO) | Source: Ambulatory Visit | Attending: Radiation Oncology | Admitting: Radiation Oncology

## 2022-12-18 DIAGNOSIS — Z170 Estrogen receptor positive status [ER+]: Secondary | ICD-10-CM

## 2022-12-19 ENCOUNTER — Ambulatory Visit: Payer: Medicaid Other | Admitting: Rehabilitation

## 2022-12-20 ENCOUNTER — Ambulatory Visit: Payer: Medicaid Other | Admitting: Physical Therapy

## 2022-12-21 NOTE — Progress Notes (Incomplete)
Radiation Oncology         [phone] ________________________________  Name: Lynn [name]        MRN: [id number]  Date of Service: 12/26/2022 DOB: October 19, 1985  CC:Gerre Scull, NP  Serena Croissant, MD     REFERRING PHYSICIAN: Serena Croissant, MD   DIAGNOSIS: The encounter diagnosis was Malignant neoplasm of overlapping sites of left breast in female, estrogen receptor positive (HCC).   HISTORY OF PRESENT ILLNESS: Lynn [name] is a 37 y.o. adult originally seen in the multidisciplinary breast clinic for a new diagnosis of left breast cancer. The patient was noted to have tenderness in the left breast and dimpling at that area for about a month and a half.  She underwent diagnostic mammogram that showed an indistinct mass in the upper outer quadrant of the left breast.  Partial examination of the left axilla showed numerous enlarged lymph nodes.  By ultrasound there was skin thickening of the areolar region spanning 5 mm.  In the 2 o'clock position of the left breast there was an irregular mass measuring 3 cm in greatest dimension with internal blood flow and at 1:00 a small irregular mass measuring 7 mm together the lesion in the 1 and 2 o'clock position spans approximately 5 cm.  The left axillary ultrasound showed numerous enlarged lymph nodes a total of 5 abnormal nodes were noted in real-time examination other nodes were present demonstrating normal morphology.  She underwent a biopsy on 05/29/2022 during this, a third mass was identified as well.  Her biopsy in the 2:00 positions showed a grade 3 invasive ductal carcinoma focally suspicious for LVI, the 1 o'clock position also showed grade 3 invasive ductal carcinoma and carcinoma was noted in her left axillary node that was biopsied.  She did undergo another biopsy this morning. Her biopsies from last week however showed ER/PR positive HER2 negative with a Ki-67 of 60% from the 2 o'clock position specimen.   Since her last visit, the  patient underwent breast MRI that showed multiple sites of proven malignancy with the overall conglomerate of suspicious findings spanning 4.4 cm containing all 3 biopsy clips and 10+ morphologically abnormal lymph nodes within the left axilla with diffuse skin and trabecular thickening of the left breast consistent with lymphovascular spread of disease and no evidence of abnormalities in the right breast.  She went on to proceed with chemotherapy between 06/20/2022 and 10/09/2022.  Post treatment MRI on 10/29/2022 showed excellent response with complete resolution of the findings in the left breast and axilla persistent diffuse skin and trabecular thickening of the left breast resolved as well.  She underwent a left lumpectomy and axillary lymph node dissection on 11/20/22.  No residual carcinoma was seen in her lumpectomy specimen, and all 14 lymph nodes were negative for metastatic disease.  She is seen to discuss adjuvant radiotherapy.   PREVIOUS RADIATION THERAPY: No   PAST MEDICAL HISTORY:  Past Medical History:  Diagnosis Date   Anemia    Cancer (HCC) 2024   L breast   Carpal tunnel syndrome, left    Depression    Diabetes (HCC)    Dysrhythmia    Palpitations   GERD (gastroesophageal reflux disease)    Hypertension    Iron deficiency    Panic attacks    PCOS (polycystic ovarian syndrome)    Pneumonia    Tachycardia    Umbilical hernia        PAST SURGICAL HISTORY: Past Surgical History:  Procedure Laterality Date  ADENOIDECTOMY     As a child   AXILLARY LYMPH NODE DISSECTION Left 11/20/2022   Procedure: LEFT AXILLARY DISSECTION;  Surgeon: Emelia Loron, MD;  Location: [facility] OR;  Service: General;  Laterality: Left;   BREAST BIOPSY Left 05/29/2022   [place] LT BREAST BX W LOC DEV 1ST LESION IMG BX SPEC US GUIDE 05/29/2022 [facility]   BREAST BIOPSY Left 05/29/2022   [place] LT BREAST BX W LOC DEV EA ADD LESION IMG BX SPEC US GUIDE 05/29/2022 [facility]   BREAST BIOPSY  Left 06/06/2022   MM LT BREAST BX W LOC DEV 1ST LESION IMAGE BX SPEC STEREO GUIDE 06/06/2022 [facility]   BREAST BIOPSY  11/19/2022   MM LT RADIOACTIVE SEED LOC MAMMO GUIDE 11/19/2022 [facility]   BREAST BIOPSY  11/19/2022   MM LT RADIOACTIVE SEED EA ADD LESION LOC MAMMO GUIDE 11/19/2022 [facility]   BREAST BIOPSY  11/19/2022   MM LT RADIOACTIVE SEED EA ADD LESION LOC MAMMO GUIDE 11/19/2022 [facility]   BREAST LUMPECTOMY WITH RADIOACTIVE SEED AND AXILLARY LYMPH NODE DISSECTION Left 11/20/2022   Procedure: LEFT BREAST LUMPECTOMY WITH RADIOACTIVE SEED;  Surgeon: Emelia Loron, MD;  Location: [facility] OR;  Service: General;  Laterality: Left;  PEC BLOCK   CESAREAN SECTION  2015   PORT-A-CATH REMOVAL Right 11/20/2022   Procedure: REMOVAL PORT-A-CATH;  Surgeon: Emelia Loron, MD;  Location: [facility] OR;  Service: General;  Laterality: Right;   PORTACATH PLACEMENT N/A 06/19/2022   Procedure: INSERTION PORT-A-CATH;  Surgeon: Emelia Loron, MD;  Location: [facility] OR;  Service: General;  Laterality: N/A;   TONSILLECTOMY     As a child     FAMILY HISTORY:  Family History  Problem Relation Age of Onset   Heart disease Mother    Diabetes Mother    Hypertension Mother    Kidney disease Mother    Heart failure Mother    Heart attack Mother 64   Arthritis Mother    Asthma Mother    COPD Mother    Depression Mother    Hyperlipidemia Mother    Mental illness Mother    Bladder Cancer Mother    Heart disease Father    Heart attack Father 66   COPD Father    Hyperlipidemia Father    Hypertension Father    Mental illness Father    Arthritis Sister    Depression Sister    Diabetes Sister    Hypertension Sister    Mental illness Sister    Miscarriages / Stillbirths Sister    Alcohol abuse Sister    Depression Sister    Diabetes Sister    Heart disease Sister    Heart attack Sister 42   Arthritis Sister    Kidney disease Sister    Transient ischemic attack Sister     Bladder Cancer Maternal Aunt    Colon cancer Maternal Aunt 62 - 71   Other Maternal Aunt 44 - 59       brain tumor (unknown if benign or malignant)   Lung cancer Paternal Aunt 79   Melanoma Paternal Aunt 40 - 49   Heart failure Maternal Grandmother    Arthritis Maternal Grandmother    Alcohol abuse Maternal Grandmother    COPD Maternal Grandmother    Diabetes Maternal Grandmother    Hearing loss Maternal Grandmother    Heart disease Maternal Grandmother    Hyperlipidemia Maternal Grandmother    Hypertension Maternal Grandmother    Stroke Maternal Grandmother  Miscarriages / Stillbirths Maternal Grandmother    Mental illness Maternal Grandmother    Heart attack Maternal Grandmother    Heart failure Maternal Grandfather    Alcohol abuse Maternal Grandfather    Arthritis Maternal Grandfather    Early death Maternal Grandfather    Heart disease Maternal Grandfather    Hyperlipidemia Maternal Grandfather    Hypertension Maternal Grandfather    Heart attack Maternal Grandfather 87   Mental illness Maternal Grandfather    Heart failure Paternal Grandmother    Arthritis Paternal Grandmother    Diabetes Paternal Grandmother    Heart attack Paternal Grandmother    Miscarriages / Stillbirths Paternal Grandmother    Mental illness Paternal Grandmother    Kidney disease Paternal Grandmother    Intellectual disability Paternal Grandmother    Hypertension Paternal Grandmother    Hyperlipidemia Paternal Grandmother    Heart disease Paternal Grandmother    Heart failure Paternal Grandfather    Arthritis Paternal Grandfather    Heart attack Paternal Grandfather    Mental illness Paternal Grandfather    Hearing loss Paternal Grandfather    Heart disease Paternal Grandfather    Hyperlipidemia Paternal Grandfather    Hypertension Paternal Grandfather      SOCIAL HISTORY:  reports that she quit smoking about 13 months ago. Her smoking use included cigarettes and e-cigarettes. She  started smoking about 15 years ago. She has a 7 pack-year smoking history. She has never used smokeless tobacco. She reports that she does not currently use alcohol. She reports current drug use. Drug: Marijuana. The patient is engaged to be married and lives in [place]. She works for [employer] and works 3rd shift ad has an 25 year old daughter. ***   ALLERGIES: Macrobid [nitrofurantoin macrocrystal] and Ibuprofen   MEDICATIONS:  Current Outpatient Medications  Medication Sig Dispense Refill   ALPRAZolam (XANAX) 0.5 MG tablet Take 1 tablet (0.5 mg total) by mouth at bedtime as needed for anxiety. 30 tablet 1   aspirin EC 325 MG tablet Take 325 mg by mouth in the morning.     aspirin-acetaminophen-caffeine (EXCEDRIN MIGRAINE) 250-250-65 MG tablet Take 1-2 tablets by mouth 2 (two) times daily as needed for headache or migraine.     Blood Glucose Monitoring Suppl (ACCU-CHEK AVIVA PLUS) w/Device KIT 1 each by Does not apply route daily at 6 (six) AM. 1 kit 0   carvedilol (COREG) 25 MG tablet TAKE 1 TABLET(25 MG) BY MOUTH TWICE DAILY WITH A MEAL 180 tablet 0   cyclobenzaprine (FLEXERIL) 10 MG tablet Take 1 tablet (10 mg total) by mouth 3 (three) times daily as needed for muscle spasms. 30 tablet 1   famotidine (PEPCID) 20 MG tablet TAKE 1 TABLET(20 MG) BY MOUTH TWICE DAILY 180 tablet 0   glucose blood (ACCU-CHEK AVIVA PLUS) test strip Use as instructed 100 each 12   lidocaine (XYLOCAINE) 2 % jelly Apply 1 Application topically as needed. (Patient not taking: Reported on 11/09/2022) 85 g 0   lidocaine-prilocaine (EMLA) cream Apply to affected area once (Patient not taking: Reported on 11/29/2022) 30 g 3   magic mouthwash (lidocaine, diphenhydrAMINE, alum & mag hydroxide) suspension Take 5 mLs by mouth 3 (three) times daily as needed for mouth pain. (Patient not taking: Reported on 11/09/2022) 120 mL 0   methocarbamol (ROBAXIN) 750 MG tablet Take 1 tablet (750 mg total) by mouth 4 (four) times daily as  needed (use for muscle cramps/pain). 30 tablet 0   ondansetron (ZOFRAN) 8 MG tablet Take 1  tab (8 mg) by mouth every 8 hrs as needed for nausea/vomiting. Start third day after doxorubicin/cyclophosphamide chemotherapy. (Patient not taking: Reported on 11/09/2022) 30 tablet 1   ONE TOUCH CLUB LANCETS MISC 1 each by Does not apply route daily. 100 each 11   prochlorperazine (COMPAZINE) 10 MG tablet Take 1 tablet (10 mg total) by mouth every 6 (six) hours as needed for nausea or vomiting. (Patient not taking: Reported on 11/09/2022) 30 tablet 1   Semaglutide,0.25 or 0.5MG /DOS, (OZEMPIC, 0.25 OR 0.5 MG/DOSE,) 2 MG/3ML SOPN Inject 0.5 mg into the skin once a week. 3 mL 2   traMADol (ULTRAM) 50 MG tablet Take 1 tablet (50 mg total) by mouth every 6 (six) hours as needed. 15 tablet 0   No current facility-administered medications for this visit.     REVIEW OF SYSTEMS: On review of systems, the patient reports that she***     PHYSICAL EXAM:  Wt Readings from Last 3 Encounters:  11/29/22 (!) 333 lb 9.6 oz (151.3 kg)  11/20/22 (!) 335 lb (152 kg)  11/15/22 (!) 340 lb (154.2 kg)   Temp Readings from Last 3 Encounters:  11/29/22 (!) 97.3 F (36.3 C) (Temporal)  11/21/22 98.3 F (36.8 C) (Oral)  11/15/22 97.6 F (36.4 C)   BP Readings from Last 3 Encounters:  11/29/22 (!) 151/73  11/21/22 108/65  11/15/22 128/89   Pulse Readings from Last 3 Encounters:  11/29/22 95  11/21/22 (!) 102  11/15/22 (!) 103    In general this is a well appearing Caucasian female in no acute distress. She's alert and oriented x4 and appropriate throughout the examination. Cardiopulmonary assessment is negative for acute distress and she exhibits normal effort.  Her left breast and axillary incision sites are well-healed without erythema, separation or drainage.    ECOG = 1  0 - Asymptomatic (Fully active, able to carry on all predisease activities without restriction)  1 - Symptomatic but completely ambulatory  (Restricted in physically strenuous activity but ambulatory and able to carry out work of a light or sedentary nature. For example, light housework, office work)  2 - Symptomatic, <50% in bed during the day (Ambulatory and capable of all self care but unable to carry out any work activities. Up and about more than 50% of waking hours)  3 - Symptomatic, >50% in bed, but not bedbound (Capable of only limited self-care, confined to bed or chair 50% or more of waking hours)  4 - Bedbound (Completely disabled. Cannot carry on any self-care. Totally confined to bed or chair)  5 - Death   Santiago Glad MM, Creech RH, Tormey DC, et al. [phone]). "Toxicity and response criteria of the [facility]". Am. Evlyn Clines. Oncol. 5 (6): 649-55    LABORATORY DATA:  Lab Results  Component Value Date   WBC 6.3 11/15/2022   HGB 11.4 (L) 11/15/2022   HCT 35.0 (L) 11/15/2022   MCV 84.3 11/15/2022   PLT 235 11/15/2022   Lab Results  Component Value Date   NA 135 11/15/2022   K 4.1 11/15/2022   CL 101 11/15/2022   CO2 25 11/15/2022   Lab Results  Component Value Date   ALT 22 10/16/2022   AST 17 10/16/2022   ALKPHOS 61 10/16/2022   BILITOT 0.5 10/16/2022      RADIOGRAPHY: No results found.     IMPRESSION/PLAN: 1. 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 Dr. Mitzi Hansen has reviewed the  patient's course to date and final pathology findings.  She has done remarkably well with excellent radiographic and pathologic response to her neoadjuvant chemotherapy.  She is ready to proceed with adjuvant radiation to the breast and regional lymph nodes to reduce risks of local recurrence. We discussed the risks, benefits, short, and long term effects of radiotherapy, as well as the curative intent, and the patient is interested in proceeding.  We reviewed the delivery and logistics of radiotherapy and Dr. Mitzi Hansen recommends 6 1/2 weeks of radiotherapy to the left  breast and regional nodes. Written consent is obtained and placed in the chart, a copy was provided to the patient. She will simulate *** 2. Contraceptive Counseling. She is aware of the need to reduce risks of pregnancy during treatment. ***   In a visit lasting *** minutes, greater than 50% of the time was spent face to face reviewing her case, as well as in preparation of, discussing, and coordinating the patient's care.      Osker Mason, [facility]    **Disclaimer: This note was dictated with voice recognition software. Similar sounding words can inadvertently be transcribed and this note may contain transcription errors which may not have been corrected upon publication of note.**

## 2022-12-24 ENCOUNTER — Other Ambulatory Visit: Payer: Self-pay | Admitting: Hematology and Oncology

## 2022-12-25 ENCOUNTER — Ambulatory Visit: Payer: Medicaid Other

## 2022-12-25 ENCOUNTER — Encounter: Payer: Self-pay | Admitting: *Deleted

## 2022-12-25 DIAGNOSIS — C50412 Malignant neoplasm of upper-outer quadrant of left female breast: Secondary | ICD-10-CM | POA: Diagnosis not present

## 2022-12-25 DIAGNOSIS — M25512 Pain in left shoulder: Secondary | ICD-10-CM | POA: Diagnosis not present

## 2022-12-25 DIAGNOSIS — R293 Abnormal posture: Secondary | ICD-10-CM | POA: Diagnosis not present

## 2022-12-25 DIAGNOSIS — G8929 Other chronic pain: Secondary | ICD-10-CM

## 2022-12-25 DIAGNOSIS — M25612 Stiffness of left shoulder, not elsewhere classified: Secondary | ICD-10-CM

## 2022-12-25 DIAGNOSIS — Z170 Estrogen receptor positive status [ER+]: Secondary | ICD-10-CM | POA: Diagnosis not present

## 2022-12-25 DIAGNOSIS — M6281 Muscle weakness (generalized): Secondary | ICD-10-CM

## 2022-12-25 NOTE — Therapy (Signed)
OUTPATIENT PHYSICAL THERAPY BREAST CANCER TREATMENT   Patient Name: Lynn [name] MRN: [id number] DOB:[birth year], 37 y.o., adult Today's Date: 12/25/2022  END OF SESSION:  PT End of Session - 12/25/22 0806     Visit Number 3    Number of Visits 10    Date for PT Re-Evaluation 01/09/23    PT Start Time 0803    PT Stop Time 0902    PT Time Calculation (min) 59 min    Activity Tolerance Patient tolerated treatment well    Behavior During Therapy [facility] for tasks assessed/performed             Past Medical History:  Diagnosis Date   Anemia    Cancer (HCC) 2024   L breast   Carpal tunnel syndrome, left    Depression    Diabetes (HCC)    Dysrhythmia    Palpitations   GERD (gastroesophageal reflux disease)    Hypertension    Iron deficiency    Panic attacks    PCOS (polycystic ovarian syndrome)    Pneumonia    Tachycardia    Umbilical hernia    Past Surgical History:  Procedure Laterality Date   ADENOIDECTOMY     As a child   AXILLARY LYMPH NODE DISSECTION Left 11/20/2022   Procedure: LEFT AXILLARY DISSECTION;  Surgeon: Emelia Loron, MD;  Location: [facility] OR;  Service: General;  Laterality: Left;   BREAST BIOPSY Left 05/29/2022   [place] LT BREAST BX W LOC DEV 1ST LESION IMG BX SPEC US GUIDE 05/29/2022 [facility]   BREAST BIOPSY Left 05/29/2022   [place] LT BREAST BX W LOC DEV EA ADD LESION IMG BX SPEC US GUIDE 05/29/2022 [facility]   BREAST BIOPSY Left 06/06/2022   MM LT BREAST BX W LOC DEV 1ST LESION IMAGE BX SPEC STEREO GUIDE 06/06/2022 [facility]   BREAST BIOPSY  11/19/2022   MM LT RADIOACTIVE SEED LOC MAMMO GUIDE 11/19/2022 [facility]   BREAST BIOPSY  11/19/2022   MM LT RADIOACTIVE SEED EA ADD LESION LOC MAMMO GUIDE 11/19/2022 [facility]   BREAST BIOPSY  11/19/2022   MM LT RADIOACTIVE SEED EA ADD LESION LOC MAMMO GUIDE 11/19/2022 [facility]   BREAST LUMPECTOMY WITH RADIOACTIVE SEED AND AXILLARY LYMPH NODE DISSECTION Left 11/20/2022    Procedure: LEFT BREAST LUMPECTOMY WITH RADIOACTIVE SEED;  Surgeon: Emelia Loron, MD;  Location: [facility] OR;  Service: General;  Laterality: Left;  PEC BLOCK   CESAREAN SECTION  2015   PORT-A-CATH REMOVAL Right 11/20/2022   Procedure: REMOVAL PORT-A-CATH;  Surgeon: Emelia Loron, MD;  Location: [facility] OR;  Service: General;  Laterality: Right;   PORTACATH PLACEMENT N/A 06/19/2022   Procedure: INSERTION PORT-A-CATH;  Surgeon: Emelia Loron, MD;  Location: [facility] OR;  Service: General;  Laterality: N/A;   TONSILLECTOMY     As a child   Patient Active Problem List   Diagnosis Date Noted   Breast cancer (HCC) 11/20/2022   Port-A-Cath in place 07/31/2022   Genetic testing 06/15/2022   Anxiety and depression 06/11/2022   Malignant neoplasm of overlapping sites of left breast in female, estrogen receptor positive (HCC) 06/04/2022   Syncope and collapse 03/12/2022   Umbilical hernia without obstruction and without gangrene 03/12/2022   Vitamin D deficiency 03/12/2022   Cervical radicular pain 12/07/2021   Snoring 12/07/2021   Other fatigue 12/07/2021   Chest pain 08/15/2021   Epigastric pain 08/15/2021   Acute pain of both shoulders 04/21/2021   Nerve pain 04/21/2021   Morbid  obesity (HCC) 10/13/2020   Essential hypertension 03/12/2019   Chronic tachycardia 03/12/2019   Generalized anxiety disorder with panic attacks 03/12/2019   Family history of heart attack 03/12/2019   LVH (left ventricular hypertrophy) due to hypertensive disease, without heart failure 03/12/2019   Type 2 diabetes mellitus (HCC) 03/12/2019      REFERRING PROVIDER: Dr. Emelia Loron  REFERRING DIAG: Left Breast Cancer  THERAPY DIAG:  Malignant neoplasm of upper-outer quadrant of left breast in female, estrogen receptor positive (HCC)  Abnormal posture  Chronic left shoulder pain  Stiffness of left shoulder, not elsewhere classified  Muscle weakness (generalized)  Acute pain of left  shoulder  Rationale for Evaluation and Treatment: Rehabilitation  ONSET DATE: 05/21/2022  SUBJECTIVE:                                                                                                                                                                                           SUBJECTIVE STATEMENT: I haven't been able to wear the compression bra. No matter what it just rubs my armpit where the incision is so Dr.Wakefield told me I could stop wearing it.   PERTINENT HISTORY:  Patient was diagnosed on 05/21/2022 with left grade 3 invasive ductal carcinoma breast cancer. It measures 3 cm and 7 mm (5 cm total) and is located in the upper outer quadrant. It is ER/PR positive and HER2 negative with a Ki67 of 60%. She has 5 abnormal appearing axillary lymph nodes and 1 was biopsied and is positive. She has a BMI > 50, hypertension, and diabetes. Left rotator cuff tear; needs surgery but is now postponed. Pt had neoadjuvant chemotherapy and she is s/p a Left lumpectomy with ALND and 0+/14 LN's. She will have radiation simulation on Sept 17, 2024  PATIENT GOALS:  Reassess how my recovery is going related to arm function, pain, and swelling.  PAIN:  Are you having pain? Yes: NPRS scale: 3-7/10 Pain location: left breast, and left shoulder Pain description: soreness, some sharpness Aggravating factors: reaching with left UE,  can't sleep on left side, Relieving factors: rest, muscle relaxer  PRECAUTIONS: Recent Surgery, right UE Lymphedema risk,  BMI > 50, hypertension, and diabetes. Left rotator cuff tear; needs surgery but is now postponed. RED FLAGS: None   ACTIVITY LEVEL / LEISURE: not back to her usual activities   OBJECTIVE:   PATIENT SURVEYS:  QUICK DASH: 38.64%  OBSERVATIONS: Incisions healing well, no visible left breast edema  POSTURE:  Forward head and rounded shoulders posture   LYMPHEDEMA ASSESSMENT:   UPPER EXTREMITY AROM/PROM:   A/PROM RIGHT   eval     Shoulder  extension 50  Shoulder flexion 149  Shoulder abduction 135  Shoulder internal rotation 72  Shoulder external rotation 89                          (Blank rows = not tested)   A/PROM LEFT   eval LEFT  Shoulder extension 48 39  Shoulder flexion 141 and painful/tight 104  Shoulder abduction 156 85  Shoulder internal rotation 59 NT due to inability to abd 90  Shoulder external rotation 78 "   "                          (Blank rows = not tested)   CERVICAL AROM: All within normal limits   UPPER EXTREMITY STRENGTH: Not tested due to left rotator cuff tear   LYMPHEDEMA ASSESSMENTS:    LANDMARK RIGHT   eval RIGHT 9/11/20247.9  10 cm proximal to olecranon process 43.8 44.3  Olecranon process 33.5 34.7  10 cm proximal to ulnar styloid process 29.4 31.5  Just proximal to ulnar styloid process 20.3 21.4  Across hand at thumb web space 23.2 23.7  At base of 2nd digit 7.8 7.9  (Blank rows = not tested)   LANDMARK LEFT   eval LEFT 12/12/2022  10 cm proximal to olecranon process 41.3 44.2  Olecranon process 32.4 33.9  10 cm proximal to ulnar styloid process 28 29.2  Just proximal to ulnar styloid process 20.8 21.1  Across hand at thumb web space 22.7 23.2  At base of 2nd digit 7.6 7.7  (Blank rows = not tested)    Surgery type/Date: 11/20/2022, Left lumpectomy with ALND Number of lymph nodes removed: 0+/14 Current/past treatment (chemo, radiation, hormone therapy): had neoadjuvant chemotherapy, pending radiation Other symptoms:  Heaviness/tightness Yes, lateral trunk Pain Yes Pitting edema No Infections No Decreased scar mobility Yes Stemmer sign No   TODAY'S TREATMENT: Therapeutic Exercises Supine over half foam roll for following: Bil UE horz abd x 10, bil UE scaption into a "V" x 6, required rest then x 4; then bil UE abd "snow angel" x 10, 5 sec holds returning therapist Pulleys into flexion and then abd x 2 mins each with VC's to decrease Lt scapular  compensation Ball roll up wall into flexion x 10, and then Lt abd x 5 returning therapist demo Manual Therapy P/ROM to Lt shoulder into flex, abd and D2 with scapular depression by therapist throughout, multiple VC's to relax due to muscle guarding MFR to Lt antecubital fossa where cording palpable STM to Lt lateral breast at incision and inferior to this where scar tissue palpable and pt very tender   PATIENT EDUCATION:  Education details: ABC class, SOZO screens, need for compression sleeve, HEP review with flexion and stargazer in supine, standing wall slides. Foam pad made for axillary and lower border border of compression bra Person educated: Patient Education method: Explanation, Demonstration, and Handouts Education comprehension: verbalized understanding, returned demonstration, and needs further education  HOME EXERCISE PROGRAM: Reviewed previously given post op HEP with flexion and stargazer in supine, standing wall slides, sitting/standing scapular retraction   ASSESSMENT:  CLINICAL IMPRESSION: Began A/AA/ROM exercises with pt today. She tolerated these well reporting feeling good stretches, but did encourage her not to push into pain. Then performed manual therapy working to decrease Lt upper quadrant tightness, especially at lateral breast. Cording palpable at Lt antecubital fossa and pt with some tenderness here but tolerated MFR  well. She required multiple VC's to relax during session due to muscle guarding as pt has been dealing with Lt shoulder pain from RTC for about a year before cancer diagnosis. She plans to have surgery for this once cancer treatments complete since she had to put this on hold due to cancer diagnosis.   Pt will benefit from skilled therapeutic intervention to improve on the following deficits: Decreased knowledge of precautions, impaired UE functional use, pain, decreased ROM, postural dysfunction.   PT treatment/interventions: ADL/Self care home  management, Therapeutic exercises, Therapeutic activity, Neuromuscular re-education, Patient/Family education, Self Care, Joint mobilization, Orthotic/Fit training, scar mobilization, Manual therapy, and Re-evaluation   GOALS: Goals reviewed with patient? Yes  LONG TERM GOALS:  (STG=LTG)  GOALS Name Target Date  Goal status  1 Pt will demonstrate she has regained full shoulder ROM and function post operatively compared to baselines.  Baseline: 01/09/2023 INITIAL  2 Pt will have 40-50 % or greater reduction in pain in lateral trunk, and left breast r 01/09/2023 INITIAL  3 Pt will be fit for appropriate Prophylactic Left UE compression garment and will be independent in donning/doffing 01/09/2023 INITIAL  4 Quick dash will be no greater than 18- 20% to demonstrate improved function 01/09/2023 INITIAL     PLAN:  PT FREQUENCY/DURATION: 2x/week x 4 weeks  PLAN FOR NEXT SESSION: have pt measured for custom sleeve, review HEP, PROM activities, STM prn, gentle strength/stability   [facility]  [address], [address]  [place] [address]  [phone]   Hermenia Bers, PTA 12/25/2022, 9:09 AM

## 2022-12-26 ENCOUNTER — Ambulatory Visit
Admission: RE | Admit: 2022-12-26 | Discharge: 2022-12-26 | Disposition: A | Discharge status: Home / Self Care | Payer: Medicaid Other | Source: Ambulatory Visit | Attending: Radiation Oncology | Admitting: Radiation Oncology

## 2022-12-26 ENCOUNTER — Encounter: Payer: Self-pay | Admitting: Radiation Oncology

## 2022-12-26 ENCOUNTER — Ambulatory Visit: Payer: Medicaid Other | Admitting: Radiation Oncology

## 2022-12-26 VITALS — BP 119/64 | HR 97 | Temp 97.7°F | Resp 20 | Ht 67.0 in | Wt 340.4 lb

## 2022-12-26 DIAGNOSIS — K449 Diaphragmatic hernia without obstruction or gangrene: Secondary | ICD-10-CM | POA: Insufficient documentation

## 2022-12-26 DIAGNOSIS — E282 Polycystic ovarian syndrome: Secondary | ICD-10-CM | POA: Diagnosis not present

## 2022-12-26 DIAGNOSIS — C50812 Malignant neoplasm of overlapping sites of left female breast: Secondary | ICD-10-CM | POA: Diagnosis not present

## 2022-12-26 DIAGNOSIS — Z801 Family history of malignant neoplasm of trachea, bronchus and lung: Secondary | ICD-10-CM | POA: Insufficient documentation

## 2022-12-26 DIAGNOSIS — C50412 Malignant neoplasm of upper-outer quadrant of left female breast: Secondary | ICD-10-CM | POA: Diagnosis not present

## 2022-12-26 DIAGNOSIS — Z8052 Family history of malignant neoplasm of bladder: Secondary | ICD-10-CM | POA: Insufficient documentation

## 2022-12-26 DIAGNOSIS — Z800 Family history of malignant neoplasm of digestive organs: Secondary | ICD-10-CM | POA: Diagnosis not present

## 2022-12-26 DIAGNOSIS — R000 Tachycardia, unspecified: Secondary | ICD-10-CM | POA: Insufficient documentation

## 2022-12-26 DIAGNOSIS — Z7985 Long-term (current) use of injectable non-insulin antidiabetic drugs: Secondary | ICD-10-CM | POA: Insufficient documentation

## 2022-12-26 DIAGNOSIS — Z170 Estrogen receptor positive status [ER+]: Secondary | ICD-10-CM

## 2022-12-26 DIAGNOSIS — R590 Localized enlarged lymph nodes: Secondary | ICD-10-CM | POA: Diagnosis not present

## 2022-12-26 DIAGNOSIS — D509 Iron deficiency anemia, unspecified: Secondary | ICD-10-CM | POA: Insufficient documentation

## 2022-12-26 DIAGNOSIS — Z79899 Other long term (current) drug therapy: Secondary | ICD-10-CM | POA: Insufficient documentation

## 2022-12-26 DIAGNOSIS — E119 Type 2 diabetes mellitus without complications: Secondary | ICD-10-CM | POA: Insufficient documentation

## 2022-12-26 DIAGNOSIS — K219 Gastro-esophageal reflux disease without esophagitis: Secondary | ICD-10-CM | POA: Insufficient documentation

## 2022-12-26 DIAGNOSIS — I10 Essential (primary) hypertension: Secondary | ICD-10-CM | POA: Insufficient documentation

## 2022-12-26 DIAGNOSIS — Z87891 Personal history of nicotine dependence: Secondary | ICD-10-CM | POA: Diagnosis not present

## 2022-12-26 NOTE — Progress Notes (Signed)
Nursing interview for Malignant neoplasm of overlapping sites of left breast in female, estrogen receptor positive (HCC). Patient identity verified x2.  Patient reports LT breast tenderness 5/10 at incision line, but is otherwise well. No other issues conveyed at this time.  Meaningful use complete. LMP-05/16/2022- (Due to CHEMO) Pt. States "No changes of pregnancy."   Vitals- BP 119/64 (BP Location: Right Arm, Patient Position: Sitting, Cuff Size: Large)   Pulse 97   Temp 97.7 F (36.5 C) (Temporal)   Resp 20   Ht 5\' 7"  (1.702 m)   Wt (!) 340 lb 6.4 oz (154.4 kg)   LMP 05/17/2022 (Approximate) Comment: Chemo has stopped menstruals. Pt. states no chance of pregnancy.  SpO2 98%   BMI 53.31 kg/m   This concludes the interaction.  Ruel Favors, LPN

## 2022-12-28 ENCOUNTER — Ambulatory Visit: Payer: Medicaid Other

## 2022-12-31 ENCOUNTER — Ambulatory Visit: Payer: Medicaid Other

## 2022-12-31 ENCOUNTER — Encounter: Payer: Self-pay | Admitting: Hematology and Oncology

## 2023-01-01 ENCOUNTER — Encounter: Payer: Self-pay | Admitting: *Deleted

## 2023-01-02 ENCOUNTER — Ambulatory Visit: Payer: Medicaid Other | Attending: General Surgery

## 2023-01-02 DIAGNOSIS — M25512 Pain in left shoulder: Secondary | ICD-10-CM | POA: Insufficient documentation

## 2023-01-02 DIAGNOSIS — M25612 Stiffness of left shoulder, not elsewhere classified: Secondary | ICD-10-CM | POA: Diagnosis not present

## 2023-01-02 DIAGNOSIS — Z170 Estrogen receptor positive status [ER+]: Secondary | ICD-10-CM | POA: Insufficient documentation

## 2023-01-02 DIAGNOSIS — G8929 Other chronic pain: Secondary | ICD-10-CM | POA: Diagnosis not present

## 2023-01-02 DIAGNOSIS — R293 Abnormal posture: Secondary | ICD-10-CM | POA: Diagnosis not present

## 2023-01-02 DIAGNOSIS — M6281 Muscle weakness (generalized): Secondary | ICD-10-CM | POA: Diagnosis not present

## 2023-01-02 DIAGNOSIS — C50412 Malignant neoplasm of upper-outer quadrant of left female breast: Secondary | ICD-10-CM | POA: Insufficient documentation

## 2023-01-02 NOTE — Therapy (Signed)
OUTPATIENT PHYSICAL THERAPY BREAST CANCER TREATMENT   Patient Name: Lynn [name] MRN: [id number] DOB:03-Jan-1986, 37 y.o., adult Today's Date: 01/02/2023  END OF SESSION:  PT End of Session - 01/02/23 0805     Visit Number 4    Number of Visits 10    Date for PT Re-Evaluation 01/09/23    PT Start Time 0805    PT Stop Time 0858    PT Time Calculation (min) 53 min    Activity Tolerance Patient tolerated treatment well    Behavior During Therapy [facility] for tasks assessed/performed             Past Medical History:  Diagnosis Date   Anemia    Cancer (HCC) 2024   L breast   Carpal tunnel syndrome, left    Depression    Diabetes (HCC)    Dysrhythmia    Palpitations   GERD (gastroesophageal reflux disease)    Hypertension    Iron deficiency    Panic attacks    PCOS (polycystic ovarian syndrome)    Pneumonia    Tachycardia    Umbilical hernia    Past Surgical History:  Procedure Laterality Date   ADENOIDECTOMY     As a child   AXILLARY LYMPH NODE DISSECTION Left 11/20/2022   Procedure: LEFT AXILLARY DISSECTION;  Surgeon: Emelia Loron, MD;  Location: [facility] OR;  Service: General;  Laterality: Left;   BREAST BIOPSY Left 05/29/2022   [place] LT BREAST BX W LOC DEV 1ST LESION IMG BX SPEC US GUIDE 05/29/2022 [facility]   BREAST BIOPSY Left 05/29/2022   [place] LT BREAST BX W LOC DEV EA ADD LESION IMG BX SPEC US GUIDE 05/29/2022 [facility]   BREAST BIOPSY Left 06/06/2022   MM LT BREAST BX W LOC DEV 1ST LESION IMAGE BX SPEC STEREO GUIDE 06/06/2022 [facility]   BREAST BIOPSY  11/19/2022   MM LT RADIOACTIVE SEED LOC MAMMO GUIDE 11/19/2022 [facility]   BREAST BIOPSY  11/19/2022   MM LT RADIOACTIVE SEED EA ADD LESION LOC MAMMO GUIDE 11/19/2022 [facility]   BREAST BIOPSY  11/19/2022   MM LT RADIOACTIVE SEED EA ADD LESION LOC MAMMO GUIDE 11/19/2022 [facility]   BREAST LUMPECTOMY WITH RADIOACTIVE SEED AND AXILLARY LYMPH NODE DISSECTION Left 11/20/2022    Procedure: LEFT BREAST LUMPECTOMY WITH RADIOACTIVE SEED;  Surgeon: Emelia Loron, MD;  Location: [facility] OR;  Service: General;  Laterality: Left;  PEC BLOCK   CESAREAN SECTION  2015   PORT-A-CATH REMOVAL Right 11/20/2022   Procedure: REMOVAL PORT-A-CATH;  Surgeon: Emelia Loron, MD;  Location: [facility] OR;  Service: General;  Laterality: Right;   PORTACATH PLACEMENT N/A 06/19/2022   Procedure: INSERTION PORT-A-CATH;  Surgeon: Emelia Loron, MD;  Location: [facility] OR;  Service: General;  Laterality: N/A;   TONSILLECTOMY     As a child   Patient Active Problem List   Diagnosis Date Noted   Breast cancer (HCC) 11/20/2022   Port-A-Cath in place 07/31/2022   Genetic testing 06/15/2022   Anxiety and depression 06/11/2022   Malignant neoplasm of overlapping sites of left breast in female, estrogen receptor positive (HCC) 06/04/2022   Syncope and collapse 03/12/2022   Umbilical hernia without obstruction and without gangrene 03/12/2022   Vitamin D deficiency 03/12/2022   Cervical radicular pain 12/07/2021   Snoring 12/07/2021   Other fatigue 12/07/2021   Chest pain 08/15/2021   Epigastric pain 08/15/2021   Acute pain of both shoulders 04/21/2021   Nerve pain 04/21/2021   Morbid  obesity (HCC) 10/13/2020   Essential hypertension 03/12/2019   Chronic tachycardia 03/12/2019   Generalized anxiety disorder with panic attacks 03/12/2019   Family history of heart attack 03/12/2019   LVH (left ventricular hypertrophy) due to hypertensive disease, without heart failure 03/12/2019   Type 2 diabetes mellitus (HCC) 03/12/2019      REFERRING PROVIDER: Dr. Emelia Loron  REFERRING DIAG: Left Breast Cancer  THERAPY DIAG:  Malignant neoplasm of upper-outer quadrant of left breast in female, estrogen receptor positive (HCC)  Abnormal posture  Chronic left shoulder pain  Stiffness of left shoulder, not elsewhere classified  Muscle weakness (generalized)  Acute pain of left  shoulder  Rationale for Evaluation and Treatment: Rehabilitation  ONSET DATE: 05/21/2022  SUBJECTIVE:                                                                                                                                                                                           SUBJECTIVE STATEMENT: Did fine after last visit. My ROM is better than it was.. My forearm is still tender from the cord.  PERTINENT HISTORY:  Patient was diagnosed on 05/21/2022 with left grade 3 invasive ductal carcinoma breast cancer. It measures 3 cm and 7 mm (5 cm total) and is located in the upper outer quadrant. It is ER/PR positive and HER2 negative with a Ki67 of 60%. She has 5 abnormal appearing axillary lymph nodes and 1 was biopsied and is positive. She has a BMI > 50, hypertension, and diabetes. Left rotator cuff tear; needs surgery but is now postponed. Pt had neoadjuvant chemotherapy and she is s/p a Left lumpectomy with ALND and 0+/14 LN's. She will have radiation simulation on Sept 17, 2024  PATIENT GOALS:  Reassess how my recovery is going related to arm function, pain, and swelling.  PAIN:  Are you having pain? Yes: NPRS scale: 3-4/10 Pain location: left breast, and left shoulder Pain description: soreness, some sharpness Aggravating factors: reaching with left UE,  can't sleep on left side, Relieving factors: rest, muscle relaxer  PRECAUTIONS: Recent Surgery, right UE Lymphedema risk,  BMI > 50, hypertension, and diabetes. Left rotator cuff tear; needs surgery but is now postponed. RED FLAGS: None   ACTIVITY LEVEL / LEISURE: not back to her usual activities   OBJECTIVE:   PATIENT SURVEYS:  QUICK DASH: 38.64%  OBSERVATIONS: Incisions healing well, no visible left breast edema  POSTURE:  Forward head and rounded shoulders posture   LYMPHEDEMA ASSESSMENT:   UPPER EXTREMITY AROM/PROM:   A/PROM RIGHT   eval    Shoulder extension 50  Shoulder flexion 149  Shoulder  abduction 135  Shoulder internal rotation 72  Shoulder external rotation 89                          (Blank rows = not tested)   A/PROM LEFT   eval LEFT  Shoulder extension 48 39  Shoulder flexion 141 and painful/tight 104  Shoulder abduction 156 85  Shoulder internal rotation 59 NT due to inability to abd 90  Shoulder external rotation 78 "   "                          (Blank rows = not tested)   CERVICAL AROM: All within normal limits   UPPER EXTREMITY STRENGTH: Not tested due to left rotator cuff tear   LYMPHEDEMA ASSESSMENTS:    LANDMARK RIGHT   eval RIGHT 9/11/20247.9  10 cm proximal to olecranon process 43.8 44.3  Olecranon process 33.5 34.7  10 cm proximal to ulnar styloid process 29.4 31.5  Just proximal to ulnar styloid process 20.3 21.4  Across hand at thumb web space 23.2 23.7  At base of 2nd digit 7.8 7.9  (Blank rows = not tested)   LANDMARK LEFT   eval LEFT 12/12/2022  10 cm proximal to olecranon process 41.3 44.2  Olecranon process 32.4 33.9  10 cm proximal to ulnar styloid process 28 29.2  Just proximal to ulnar styloid process 20.8 21.1  Across hand at thumb web space 22.7 23.2  At base of 2nd digit 7.6 7.7  (Blank rows = not tested)    Surgery type/Date: 11/20/2022, Left lumpectomy with ALND Number of lymph nodes removed: 0+/14 Current/past treatment (chemo, radiation, hormone therapy): had neoadjuvant chemotherapy, pending radiation Other symptoms:  Heaviness/tightness Yes, lateral trunk Pain Yes Pitting edema No Infections No Decreased scar mobility Yes Stemmer sign No   TODAY'S TREATMENT:  01/02/2023 Therapeutic Exercises Pulleys x 2 min flexion and abduction ball rolls on wall forward x 10 Bilateral shoulder flexion, scaption, horizontal abd x 5 in painfree ROM Manual Therapy STM to left UT, pectorals ,inferior axilla P/ROM to Lt shoulder into flex, abd and D2 with scapular depression by therapist throughout, multiple VC's to relax  due to muscle guarding MFR to Lt antecubital fossa where cording palpable 12/25/2022 Therapeutic Exercises Supine over half foam roll for following: Bil UE horz abd x 10, bil UE scaption into a "V" x 6, required rest then x 4; then bil UE abd "snow angel" x 10, 5 sec holds returning therapist Pulleys into flexion and then abd x 2 mins each with VC's to decrease Lt scapular compensation Ball roll up wall into flexion x 10, and then Lt abd x 5 returning therapist demo Manual Therapy P/ROM to Lt shoulder into flex, abd and D2 with scapular depression by therapist throughout, multiple VC's to relax due to muscle guarding MFR to Lt antecubital fossa where cording palpable STM to Lt lateral breast at incision and inferior to this where scar tissue palpable and pt very tender   PATIENT EDUCATION:  Education details: ABC class, SOZO screens, need for compression sleeve, HEP review with flexion and stargazer in supine, standing wall slides. Foam pad made for axillary and lower border border of compression bra Person educated: Patient Education method: Explanation, Demonstration, and Handouts Education comprehension: verbalized understanding, returned demonstration, and needs further education  HOME EXERCISE PROGRAM: Reviewed previously given post op HEP with flexion and stargazer in supine, standing wall slides, sitting/standing scapular retraction  ASSESSMENT:  CLINICAL IMPRESSION: Pts AROM in supine is visibly improved and she is learning to depress her scapula to prevent pain . Good tolerance for all activities in therapy and monitored by PT to be sure no shoulder pain due to RTC tear. Muscular tightness noted in left pectorals and UT with manual work.  Pt will benefit from skilled therapeutic intervention to improve on the following deficits: Decreased knowledge of precautions, impaired UE functional use, pain, decreased ROM, postural dysfunction.   PT treatment/interventions: ADL/Self care  home management, Therapeutic exercises, Therapeutic activity, Neuromuscular re-education, Patient/Family education, Self Care, Joint mobilization, Orthotic/Fit training, scar mobilization, Manual therapy, and Re-evaluation   GOALS: Goals reviewed with patient? Yes  LONG TERM GOALS:  (STG=LTG)  GOALS Name Target Date  Goal status  1 Pt will demonstrate she has regained full shoulder ROM and function post operatively compared to baselines.  Baseline: 01/09/2023 INITIAL  2 Pt will have 40-50 % or greater reduction in pain in lateral trunk, and left breast r 01/09/2023 INITIAL  3 Pt will be fit for appropriate Prophylactic Left UE compression garment and will be independent in donning/doffing 01/09/2023 INITIAL  4 Quick dash will be no greater than 18- 20% to demonstrate improved function 01/09/2023 INITIAL     PLAN:  PT FREQUENCY/DURATION: 2x/week x 4 weeks  PLAN FOR NEXT SESSION: have pt measured for custom sleeve, review HEP, PROM activities, STM prn, gentle strength/stability   [facility]  [address]  [place] [address]  [phone]   Waynette Buttery, PT 01/02/2023, 9:00 AM

## 2023-01-03 ENCOUNTER — Other Ambulatory Visit: Payer: Self-pay

## 2023-01-04 ENCOUNTER — Ambulatory Visit
Admission: RE | Admit: 2023-01-04 | Discharge: 2023-01-04 | Disposition: A | Discharge status: Home / Self Care | Payer: Medicaid Other | Source: Ambulatory Visit | Attending: Radiation Oncology | Admitting: Radiation Oncology

## 2023-01-04 DIAGNOSIS — Z510 Encounter for antineoplastic radiation therapy: Secondary | ICD-10-CM | POA: Diagnosis not present

## 2023-01-04 DIAGNOSIS — C50812 Malignant neoplasm of overlapping sites of left female breast: Secondary | ICD-10-CM | POA: Insufficient documentation

## 2023-01-04 DIAGNOSIS — Z170 Estrogen receptor positive status [ER+]: Secondary | ICD-10-CM | POA: Insufficient documentation

## 2023-01-07 ENCOUNTER — Ambulatory Visit: Payer: Medicaid Other

## 2023-01-07 DIAGNOSIS — C50412 Malignant neoplasm of upper-outer quadrant of left female breast: Secondary | ICD-10-CM | POA: Diagnosis not present

## 2023-01-07 DIAGNOSIS — M25512 Pain in left shoulder: Secondary | ICD-10-CM

## 2023-01-07 DIAGNOSIS — M25612 Stiffness of left shoulder, not elsewhere classified: Secondary | ICD-10-CM

## 2023-01-07 DIAGNOSIS — M6281 Muscle weakness (generalized): Secondary | ICD-10-CM

## 2023-01-07 DIAGNOSIS — Z170 Estrogen receptor positive status [ER+]: Secondary | ICD-10-CM | POA: Diagnosis not present

## 2023-01-07 DIAGNOSIS — R293 Abnormal posture: Secondary | ICD-10-CM | POA: Diagnosis not present

## 2023-01-07 DIAGNOSIS — G8929 Other chronic pain: Secondary | ICD-10-CM | POA: Diagnosis not present

## 2023-01-07 NOTE — Patient Instructions (Signed)

## 2023-01-07 NOTE — Therapy (Addendum)
OUTPATIENT PHYSICAL THERAPY BREAST CANCER TREATMENT   Patient Name: Lynn [name] MRN: [id number] DOB:Aug 22, 1985, 37 y.o., adult Today's Date: 01/07/2023  END OF SESSION:  PT End of Session - 01/07/23 1002     Visit Number 5    Number of Visits 10    Date for PT Re-Evaluation 01/09/23    PT Start Time 1003    PT Stop Time 1052    PT Time Calculation (min) 49 min    Activity Tolerance Patient tolerated treatment well    Behavior During Therapy WFL for tasks assessed/performed             Past Medical History:  Diagnosis Date   Anemia    Cancer (HCC) 2024   L breast   Carpal tunnel syndrome, left    Depression    Diabetes (HCC)    Dysrhythmia    Palpitations   GERD (gastroesophageal reflux disease)    Hypertension    Iron deficiency    Panic attacks    PCOS (polycystic ovarian syndrome)    Pneumonia    Tachycardia    Umbilical hernia    Past Surgical History:  Procedure Laterality Date   ADENOIDECTOMY     As a child   AXILLARY LYMPH NODE DISSECTION Left 11/20/2022   Procedure: LEFT AXILLARY DISSECTION;  Surgeon: Emelia Loron, MD;  Location: [facility] OR;  Service: General;  Laterality: Left;   BREAST BIOPSY Left 05/29/2022   [place] LT BREAST BX W LOC DEV 1ST LESION IMG BX SPEC US GUIDE 05/29/2022 [facility]   BREAST BIOPSY Left 05/29/2022   [place] LT BREAST BX W LOC DEV EA ADD LESION IMG BX SPEC US GUIDE 05/29/2022 [facility]   BREAST BIOPSY Left 06/06/2022   MM LT BREAST BX W LOC DEV 1ST LESION IMAGE BX SPEC STEREO GUIDE 06/06/2022 [facility]   BREAST BIOPSY  11/19/2022   MM LT RADIOACTIVE SEED LOC MAMMO GUIDE 11/19/2022 [facility]   BREAST BIOPSY  11/19/2022   MM LT RADIOACTIVE SEED EA ADD LESION LOC MAMMO GUIDE 11/19/2022 [facility]   BREAST BIOPSY  11/19/2022   MM LT RADIOACTIVE SEED EA ADD LESION LOC MAMMO GUIDE 11/19/2022 [facility]   BREAST LUMPECTOMY WITH RADIOACTIVE SEED AND AXILLARY LYMPH NODE DISSECTION Left 11/20/2022    Procedure: LEFT BREAST LUMPECTOMY WITH RADIOACTIVE SEED;  Surgeon: Emelia Loron, MD;  Location: [facility] OR;  Service: General;  Laterality: Left;  PEC BLOCK   CESAREAN SECTION  2015   PORT-A-CATH REMOVAL Right 11/20/2022   Procedure: REMOVAL PORT-A-CATH;  Surgeon: Emelia Loron, MD;  Location: [facility] OR;  Service: General;  Laterality: Right;   PORTACATH PLACEMENT N/A 06/19/2022   Procedure: INSERTION PORT-A-CATH;  Surgeon: Emelia Loron, MD;  Location: [facility] OR;  Service: General;  Laterality: N/A;   TONSILLECTOMY     As a child   Patient Active Problem List   Diagnosis Date Noted   Breast cancer (HCC) 11/20/2022   Port-A-Cath in place 07/31/2022   Genetic testing 06/15/2022   Anxiety and depression 06/11/2022   Malignant neoplasm of overlapping sites of left breast in female, estrogen receptor positive (HCC) 06/04/2022   Syncope and collapse 03/12/2022   Umbilical hernia without obstruction and without gangrene 03/12/2022   Vitamin D deficiency 03/12/2022   Cervical radicular pain 12/07/2021   Snoring 12/07/2021   Other fatigue 12/07/2021   Chest pain 08/15/2021   Epigastric pain 08/15/2021   Acute pain of both shoulders 04/21/2021   Nerve pain 04/21/2021   Morbid  obesity (HCC) 10/13/2020   Essential hypertension 03/12/2019   Chronic tachycardia 03/12/2019   Generalized anxiety disorder with panic attacks 03/12/2019   Family history of heart attack 03/12/2019   LVH (left ventricular hypertrophy) due to hypertensive disease, without heart failure 03/12/2019   Type 2 diabetes mellitus (HCC) 03/12/2019      REFERRING PROVIDER: Dr. Emelia Loron  REFERRING DIAG: Left Breast Cancer  THERAPY DIAG:  Malignant neoplasm of upper-outer quadrant of left breast in female, estrogen receptor positive (HCC)  Abnormal posture  Chronic left shoulder pain  Stiffness of left shoulder, not elsewhere classified  Muscle weakness (generalized)  Acute pain of left  shoulder  Rationale for Evaluation and Treatment: Rehabilitation  ONSET DATE: 05/21/2022  SUBJECTIVE:                                                                                                                                                                                           SUBJECTIVE STATEMENT: I felt looser after last treatment.. I am sore today but no pain after ironing 20 tablecloths in 1 day.  PERTINENT HISTORY:  Patient was diagnosed on 05/21/2022 with left grade 3 invasive ductal carcinoma breast cancer. It measures 3 cm and 7 mm (5 cm total) and is located in the upper outer quadrant. It is ER/PR positive and HER2 negative with a Ki67 of 60%. She has 5 abnormal appearing axillary lymph nodes and 1 was biopsied and is positive. She has a BMI > 50, hypertension, and diabetes. Left rotator cuff tear; needs surgery but is now postponed. Pt had neoadjuvant chemotherapy and she is s/p a Left lumpectomy with ALND and 0+/14 LN's. She will have radiation simulation on Sept 17, 2024  PATIENT GOALS:  Reassess how my recovery is going related to arm function, pain, and swelling.  PAIN:  Are you having pain? No, just soreness after ironing 20 tablecloths   PRECAUTIONS: Recent Surgery, right UE Lymphedema risk,  BMI > 50, hypertension, and diabetes. Left rotator cuff tear; needs surgery but is now postponed. RED FLAGS: None   ACTIVITY LEVEL / LEISURE: not back to her usual activities   OBJECTIVE:   PATIENT SURVEYS:  QUICK DASH: 38.64%  OBSERVATIONS: Incisions healing well, no visible left breast edema  POSTURE:  Forward head and rounded shoulders posture   LYMPHEDEMA ASSESSMENT:   UPPER EXTREMITY AROM/PROM:   A/PROM RIGHT   eval    Shoulder extension 50  Shoulder flexion 149  Shoulder abduction 135  Shoulder internal rotation 72  Shoulder external rotation 89                          (  Blank rows = not tested)   A/PROM LEFT   eval LEFT 12/12/2022 Re-eval  LEFT 01/07/2023  Shoulder extension 48 39   Shoulder flexion 141 and painful/tight 104 135  Shoulder abduction 156 85 120 + shoulder  Shoulder internal rotation 59 NT due to inability to abd 90   Shoulder external rotation 78 "   "                           (Blank rows = not tested)   CERVICAL AROM: All within normal limits   UPPER EXTREMITY STRENGTH: Not tested due to left rotator cuff tear   LYMPHEDEMA ASSESSMENTS:    LANDMARK RIGHT   eval RIGHT 9/11/20247.9  10 cm proximal to olecranon process 43.8 44.3  Olecranon process 33.5 34.7  10 cm proximal to ulnar styloid process 29.4 31.5  Just proximal to ulnar styloid process 20.3 21.4  Across hand at thumb web space 23.2 23.7  At base of 2nd digit 7.8 7.9  (Blank rows = not tested)   LANDMARK LEFT   eval LEFT 12/12/2022  10 cm proximal to olecranon process 41.3 44.2  Olecranon process 32.4 33.9  10 cm proximal to ulnar styloid process 28 29.2  Just proximal to ulnar styloid process 20.8 21.1  Across hand at thumb web space 22.7 23.2  At base of 2nd digit 7.6 7.7  (Blank rows = not tested)    Surgery type/Date: 11/20/2022, Left lumpectomy with ALND Number of lymph nodes removed: 0+/14 Current/past treatment (chemo, radiation, hormone therapy): had neoadjuvant chemotherapy, pending radiation Other symptoms:  Heaviness/tightness Yes, lateral trunk Pain Yes Pitting edema No Infections No Decreased scar mobility Yes Stemmer sign No   TODAY'S TREATMENT: 01/11/2023 Addendum  Pt was measured 01/09/2023 for Custom Mediven 550 23-32  mmhg sleeve with 5 cm silicone border withelbow Flexure zone and 23-32 gauntlet. Custom required due to pts measurements being outside the Ready to Wear size chart and silicone required for sleeve to stay up, with flexure zone for comfort and fit. Garments required due to large number of LN's removed.  01/07/2023 Pulleys flexion and abduction x 2 min Therapeutic Exercises Pulleys x 2 min flexion  and abduction ball rolls on wall forward x 10 Bilateral shoulder flexion, scaption, horizontal abd x 5 in painfree ROM Manual Therapy STM to left UT, pectorals ,inferior axilla P/ROM to Lt shoulder into flex, abd and D2 with scapular depression by therapist throughout, intermittent VC's to relax due to muscle guarding MFR to Lt axilla, upper arm and antecubital fossa where cording palpable Measured AROM   01/02/2023 Therapeutic Exercises Pulleys x 2 min flexion and abduction ball rolls on wall forward x 10 Bilateral shoulder flexion, scaption, horizontal abd x 5 in painfree ROM Manual Therapy STM to left UT, pectorals ,inferior axilla P/ROM to Lt shoulder into flex, abd and D2 with scapular depression by therapist throughout, multiple VC's to relax due to muscle guarding MFR to Lt antecubital fossa where cording palpable 12/25/2022 Therapeutic Exercises Supine over half foam roll for following: Bil UE horz abd x 10, bil UE scaption into a "V" x 6, required rest then x 4; then bil UE abd "snow angel" x 10, 5 sec holds returning therapist Pulleys into flexion and then abd x 2 mins each with VC's to decrease Lt scapular compensation Ball roll up wall into flexion x 10, and then Lt abd x 5 returning therapist demo Manual Therapy P/ROM to Lt shoulder  into flex, abd and D2 with scapular depression by therapist throughout, multiple VC's to relax due to muscle guarding MFR to Lt antecubital fossa where cording palpable STM to Lt lateral breast at incision and inferior to this where scar tissue palpable and pt very tender   PATIENT EDUCATION:  Education details: ABC class, SOZO screens, need for compression sleeve, HEP review with flexion and stargazer in supine, standing wall slides. Foam pad made for axillary and lower border border of compression bra Person educated: Patient Education method: Explanation, Demonstration, and Handouts Education comprehension: verbalized understanding,  returned demonstration, and needs further education  HOME EXERCISE PROGRAM: Reviewed previously given post op HEP with flexion and stargazer in supine, standing wall slides, sitting/standing scapular retraction   ASSESSMENT:  CLINICAL IMPRESSION: Good improvement in left shoulder AROM with flexion improved 31 degrees and abduction improved 35 degrees since reeval.  Pt will benefit from skilled therapeutic intervention to improve on the following deficits: Decreased knowledge of precautions, impaired UE functional use, pain, decreased ROM, postural dysfunction.   PT treatment/interventions: ADL/Self care home management, Therapeutic exercises, Therapeutic activity, Neuromuscular re-education, Patient/Family education, Self Care, Joint mobilization, Orthotic/Fit training, scar mobilization, Manual therapy, and Re-evaluation   GOALS: Goals reviewed with patient? Yes  LONG TERM GOALS:  (STG=LTG)  GOALS Name Target Date  Goal status  1 Pt will demonstrate she has regained full shoulder ROM and function post operatively compared to baselines.  Baseline: 01/09/2023 INITIAL  2 Pt will have 40-50 % or greater reduction in pain in lateral trunk, and left breast r 01/09/2023 INITIAL  3 Pt will be fit for appropriate Prophylactic Left UE compression garment and will be independent in donning/doffing 01/09/2023 INITIAL  4 Quick dash will be no greater than 18- 20% to demonstrate improved function 01/09/2023 INITIAL     PLAN:  PT FREQUENCY/DURATION: 2x/week x 4 weeks  PLAN FOR NEXT SESSION: Reeval next, have pt measured for custom sleeve, review HEP, PROM activities, STM prn, gentle strength/stability   [facility]  [address]  [place] [address]  [phone]   Waynette Buttery, PT 01/07/2023, 10:55 AM

## 2023-01-10 ENCOUNTER — Encounter: Payer: Self-pay | Admitting: *Deleted

## 2023-01-10 DIAGNOSIS — Z170 Estrogen receptor positive status [ER+]: Secondary | ICD-10-CM

## 2023-01-14 ENCOUNTER — Encounter: Payer: Self-pay | Admitting: Radiation Oncology

## 2023-01-14 ENCOUNTER — Telehealth: Payer: Self-pay | Admitting: Radiation Oncology

## 2023-01-14 NOTE — Telephone Encounter (Signed)
10/14 @ 11:35 am Received call from patient to reschedule her treatments appointments to start on 10/21, due to being out of town for this week.  Email sent to Support RTT and copied L2 machine and Laurence Aly, so they are aware.

## 2023-01-15 ENCOUNTER — Other Ambulatory Visit: Payer: Self-pay

## 2023-01-17 ENCOUNTER — Ambulatory Visit: Payer: Medicaid Other | Admitting: Radiation Oncology

## 2023-01-17 DIAGNOSIS — Z170 Estrogen receptor positive status [ER+]: Secondary | ICD-10-CM | POA: Diagnosis not present

## 2023-01-17 DIAGNOSIS — Z510 Encounter for antineoplastic radiation therapy: Secondary | ICD-10-CM | POA: Diagnosis not present

## 2023-01-17 DIAGNOSIS — C50812 Malignant neoplasm of overlapping sites of left female breast: Secondary | ICD-10-CM | POA: Diagnosis not present

## 2023-01-18 ENCOUNTER — Ambulatory Visit: Payer: Medicaid Other

## 2023-01-21 ENCOUNTER — Ambulatory Visit
Admission: RE | Admit: 2023-01-21 | Discharge: 2023-01-21 | Disposition: A | Discharge status: Home / Self Care | Payer: Medicaid Other | Source: Ambulatory Visit | Attending: Radiation Oncology | Admitting: Radiation Oncology

## 2023-01-21 ENCOUNTER — Other Ambulatory Visit: Payer: Self-pay

## 2023-01-21 DIAGNOSIS — Z170 Estrogen receptor positive status [ER+]: Secondary | ICD-10-CM | POA: Diagnosis not present

## 2023-01-21 DIAGNOSIS — Z510 Encounter for antineoplastic radiation therapy: Secondary | ICD-10-CM | POA: Diagnosis not present

## 2023-01-21 DIAGNOSIS — C50812 Malignant neoplasm of overlapping sites of left female breast: Secondary | ICD-10-CM | POA: Diagnosis not present

## 2023-01-21 LAB — RAD ONC ARIA SESSION SUMMARY

## 2023-01-22 ENCOUNTER — Other Ambulatory Visit: Payer: Self-pay

## 2023-01-22 ENCOUNTER — Ambulatory Visit
Admission: RE | Admit: 2023-01-22 | Discharge: 2023-01-22 | Disposition: A | Discharge status: Home / Self Care | Payer: Medicaid Other | Source: Ambulatory Visit | Attending: Radiation Oncology | Admitting: Radiation Oncology

## 2023-01-22 DIAGNOSIS — Z510 Encounter for antineoplastic radiation therapy: Secondary | ICD-10-CM | POA: Diagnosis not present

## 2023-01-22 DIAGNOSIS — C50812 Malignant neoplasm of overlapping sites of left female breast: Secondary | ICD-10-CM | POA: Diagnosis not present

## 2023-01-22 DIAGNOSIS — Z170 Estrogen receptor positive status [ER+]: Secondary | ICD-10-CM | POA: Diagnosis not present

## 2023-01-22 LAB — RAD ONC ARIA SESSION SUMMARY

## 2023-01-23 ENCOUNTER — Other Ambulatory Visit: Payer: Self-pay

## 2023-01-23 ENCOUNTER — Ambulatory Visit
Admission: RE | Admit: 2023-01-23 | Discharge: 2023-01-23 | Disposition: A | Discharge status: Home / Self Care | Payer: Medicaid Other | Source: Ambulatory Visit | Attending: Radiation Oncology | Admitting: Radiation Oncology

## 2023-01-23 DIAGNOSIS — C50812 Malignant neoplasm of overlapping sites of left female breast: Secondary | ICD-10-CM | POA: Diagnosis not present

## 2023-01-23 DIAGNOSIS — Z510 Encounter for antineoplastic radiation therapy: Secondary | ICD-10-CM | POA: Diagnosis not present

## 2023-01-23 DIAGNOSIS — Z170 Estrogen receptor positive status [ER+]: Secondary | ICD-10-CM | POA: Diagnosis not present

## 2023-01-23 LAB — RAD ONC ARIA SESSION SUMMARY

## 2023-01-24 ENCOUNTER — Other Ambulatory Visit: Payer: Self-pay

## 2023-01-24 ENCOUNTER — Encounter: Payer: Self-pay | Admitting: Hematology and Oncology

## 2023-01-24 ENCOUNTER — Ambulatory Visit
Admission: RE | Admit: 2023-01-24 | Discharge: 2023-01-24 | Disposition: A | Discharge status: Home / Self Care | Payer: Medicaid Other | Source: Ambulatory Visit | Attending: Radiation Oncology | Admitting: Radiation Oncology

## 2023-01-24 DIAGNOSIS — C50812 Malignant neoplasm of overlapping sites of left female breast: Secondary | ICD-10-CM | POA: Diagnosis not present

## 2023-01-24 DIAGNOSIS — Z510 Encounter for antineoplastic radiation therapy: Secondary | ICD-10-CM | POA: Diagnosis not present

## 2023-01-24 DIAGNOSIS — Z170 Estrogen receptor positive status [ER+]: Secondary | ICD-10-CM | POA: Diagnosis not present

## 2023-01-24 LAB — RAD ONC ARIA SESSION SUMMARY
Course Elapsed Days: 3
Plan Fractions Treated to Date: 4
Plan Fractions Treated to Date: 4
Plan Prescribed Dose Per Fraction: 1.8 Gy
Plan Prescribed Dose Per Fraction: 1.8 Gy
Plan Total Fractions Prescribed: 28
Plan Total Fractions Prescribed: 28
Plan Total Prescribed Dose: 50.4 Gy
Plan Total Prescribed Dose: 50.4 Gy
Reference Point Dosage Given to Date: 7.2 Gy
Reference Point Dosage Given to Date: 7.2 Gy
Reference Point Session Dosage Given: 1.8 Gy
Reference Point Session Dosage Given: 1.8 Gy
Session Number: 4

## 2023-01-25 ENCOUNTER — Ambulatory Visit
Admission: RE | Admit: 2023-01-25 | Discharge: 2023-01-25 | Disposition: A | Discharge status: Home / Self Care | Payer: Medicaid Other | Source: Ambulatory Visit | Attending: Radiation Oncology | Admitting: Radiation Oncology

## 2023-01-25 ENCOUNTER — Other Ambulatory Visit: Payer: Self-pay

## 2023-01-25 ENCOUNTER — Ambulatory Visit
Admission: RE | Admit: 2023-01-25 | Discharge: 2023-01-25 | Disposition: A | Discharge status: Home / Self Care | Payer: Medicaid Other | Source: Ambulatory Visit | Attending: Radiation Oncology

## 2023-01-25 DIAGNOSIS — Z170 Estrogen receptor positive status [ER+]: Secondary | ICD-10-CM

## 2023-01-25 DIAGNOSIS — C50812 Malignant neoplasm of overlapping sites of left female breast: Secondary | ICD-10-CM | POA: Diagnosis not present

## 2023-01-25 DIAGNOSIS — Z510 Encounter for antineoplastic radiation therapy: Secondary | ICD-10-CM | POA: Diagnosis not present

## 2023-01-25 LAB — RAD ONC ARIA SESSION SUMMARY
Course Elapsed Days: 4
Plan Fractions Treated to Date: 5
Plan Fractions Treated to Date: 5
Plan Prescribed Dose Per Fraction: 1.8 Gy
Plan Prescribed Dose Per Fraction: 1.8 Gy
Plan Total Fractions Prescribed: 28
Plan Total Fractions Prescribed: 28
Plan Total Prescribed Dose: 50.4 Gy
Plan Total Prescribed Dose: 50.4 Gy
Reference Point Dosage Given to Date: 9 Gy
Reference Point Dosage Given to Date: 9 Gy
Reference Point Session Dosage Given: 1.8 Gy
Reference Point Session Dosage Given: 1.8 Gy
Session Number: 5

## 2023-01-25 MED ORDER — RADIAPLEXRX EX GEL: Freq: Once | CUTANEOUS | Status: AC

## 2023-01-25 MED ORDER — ALRA NON-METALLIC DEODORANT (RAD-ONC): 1.0000 | Freq: Once | TOPICAL | Status: DC

## 2023-01-25 MED ORDER — RADIAPLEXRX EX GEL: Freq: Once | CUTANEOUS | Status: DC

## 2023-01-25 MED ORDER — ALRA NON-METALLIC DEODORANT (RAD-ONC)
1.0000 | Freq: Once | TOPICAL | Status: AC
  Administered 2023-01-25: 1 via TOPICAL

## 2023-01-25 NOTE — Plan of Care (Signed)
 [facility] Tonsillectomy/Adenoidectomy, Postoperative PEDS care plan entered in error.

## 2023-01-28 ENCOUNTER — Ambulatory Visit
Admission: RE | Admit: 2023-01-28 | Discharge: 2023-01-28 | Disposition: A | Discharge status: Home / Self Care | Payer: Medicaid Other | Source: Ambulatory Visit | Attending: Radiation Oncology

## 2023-01-28 ENCOUNTER — Other Ambulatory Visit: Payer: Self-pay

## 2023-01-28 DIAGNOSIS — Z510 Encounter for antineoplastic radiation therapy: Secondary | ICD-10-CM | POA: Diagnosis not present

## 2023-01-28 DIAGNOSIS — Z170 Estrogen receptor positive status [ER+]: Secondary | ICD-10-CM | POA: Diagnosis not present

## 2023-01-28 DIAGNOSIS — C50812 Malignant neoplasm of overlapping sites of left female breast: Secondary | ICD-10-CM | POA: Diagnosis not present

## 2023-01-28 LAB — RAD ONC ARIA SESSION SUMMARY

## 2023-01-29 ENCOUNTER — Other Ambulatory Visit: Payer: Self-pay

## 2023-01-29 ENCOUNTER — Ambulatory Visit
Admission: RE | Admit: 2023-01-29 | Discharge: 2023-01-29 | Disposition: A | Discharge status: Home / Self Care | Payer: Medicaid Other | Source: Ambulatory Visit | Attending: Radiation Oncology

## 2023-01-29 DIAGNOSIS — Z170 Estrogen receptor positive status [ER+]: Secondary | ICD-10-CM | POA: Diagnosis not present

## 2023-01-29 DIAGNOSIS — Z510 Encounter for antineoplastic radiation therapy: Secondary | ICD-10-CM | POA: Diagnosis not present

## 2023-01-29 DIAGNOSIS — C50812 Malignant neoplasm of overlapping sites of left female breast: Secondary | ICD-10-CM | POA: Diagnosis not present

## 2023-01-29 LAB — RAD ONC ARIA SESSION SUMMARY

## 2023-01-30 ENCOUNTER — Other Ambulatory Visit: Payer: Self-pay

## 2023-01-30 ENCOUNTER — Ambulatory Visit
Admission: RE | Admit: 2023-01-30 | Discharge: 2023-01-30 | Disposition: A | Discharge status: Home / Self Care | Payer: Medicaid Other | Source: Ambulatory Visit | Attending: Radiation Oncology | Admitting: Radiation Oncology

## 2023-01-30 ENCOUNTER — Telehealth: Payer: Self-pay | Admitting: Radiation Oncology

## 2023-01-30 DIAGNOSIS — C50812 Malignant neoplasm of overlapping sites of left female breast: Secondary | ICD-10-CM | POA: Diagnosis not present

## 2023-01-30 DIAGNOSIS — Z510 Encounter for antineoplastic radiation therapy: Secondary | ICD-10-CM | POA: Diagnosis not present

## 2023-01-30 DIAGNOSIS — Z170 Estrogen receptor positive status [ER+]: Secondary | ICD-10-CM | POA: Diagnosis not present

## 2023-01-30 LAB — RAD ONC ARIA SESSION SUMMARY

## 2023-01-30 NOTE — Telephone Encounter (Signed)
10/30 @ 4:13 pm Patient called to let someone know she is running late but will be here.  Called and spoke to [place] ([employer]) so they are aware.

## 2023-01-31 ENCOUNTER — Ambulatory Visit: Payer: Medicaid Other

## 2023-02-01 ENCOUNTER — Ambulatory Visit
Admission: RE | Admit: 2023-02-01 | Discharge: 2023-02-01 | Disposition: A | Discharge status: Home / Self Care | Payer: Medicaid Other | Source: Ambulatory Visit | Attending: Radiation Oncology | Admitting: Radiation Oncology

## 2023-02-01 ENCOUNTER — Other Ambulatory Visit: Payer: Self-pay

## 2023-02-01 ENCOUNTER — Ambulatory Visit: Payer: Medicaid Other

## 2023-02-01 DIAGNOSIS — Z510 Encounter for antineoplastic radiation therapy: Secondary | ICD-10-CM | POA: Diagnosis not present

## 2023-02-01 DIAGNOSIS — C50812 Malignant neoplasm of overlapping sites of left female breast: Secondary | ICD-10-CM | POA: Insufficient documentation

## 2023-02-01 DIAGNOSIS — Z170 Estrogen receptor positive status [ER+]: Secondary | ICD-10-CM | POA: Insufficient documentation

## 2023-02-01 LAB — RAD ONC ARIA SESSION SUMMARY
Course Elapsed Days: 11
Plan Fractions Treated to Date: 9
Plan Fractions Treated to Date: 9
Plan Prescribed Dose Per Fraction: 1.8 Gy
Plan Prescribed Dose Per Fraction: 1.8 Gy
Plan Total Fractions Prescribed: 28
Plan Total Fractions Prescribed: 28
Plan Total Prescribed Dose: 50.4 Gy
Plan Total Prescribed Dose: 50.4 Gy
Reference Point Dosage Given to Date: 16.2 Gy
Reference Point Dosage Given to Date: 16.2 Gy
Reference Point Session Dosage Given: 1.8 Gy
Reference Point Session Dosage Given: 1.8 Gy
Session Number: 9

## 2023-02-02 DIAGNOSIS — Z170 Estrogen receptor positive status [ER+]: Secondary | ICD-10-CM | POA: Diagnosis not present

## 2023-02-02 DIAGNOSIS — Z510 Encounter for antineoplastic radiation therapy: Secondary | ICD-10-CM | POA: Diagnosis not present

## 2023-02-02 DIAGNOSIS — C50812 Malignant neoplasm of overlapping sites of left female breast: Secondary | ICD-10-CM | POA: Diagnosis not present

## 2023-02-04 ENCOUNTER — Ambulatory Visit: Payer: Medicaid Other

## 2023-02-05 ENCOUNTER — Other Ambulatory Visit: Payer: Self-pay

## 2023-02-05 ENCOUNTER — Ambulatory Visit
Admission: RE | Admit: 2023-02-05 | Discharge: 2023-02-05 | Disposition: A | Discharge status: Home / Self Care | Payer: Medicaid Other | Source: Ambulatory Visit | Attending: Radiation Oncology

## 2023-02-05 DIAGNOSIS — C50812 Malignant neoplasm of overlapping sites of left female breast: Secondary | ICD-10-CM | POA: Diagnosis not present

## 2023-02-05 DIAGNOSIS — Z510 Encounter for antineoplastic radiation therapy: Secondary | ICD-10-CM | POA: Diagnosis not present

## 2023-02-05 DIAGNOSIS — Z170 Estrogen receptor positive status [ER+]: Secondary | ICD-10-CM | POA: Diagnosis not present

## 2023-02-05 LAB — RAD ONC ARIA SESSION SUMMARY
Course Elapsed Days: 15
Plan Fractions Treated to Date: 10
Plan Fractions Treated to Date: 10
Plan Prescribed Dose Per Fraction: 1.8 Gy
Plan Prescribed Dose Per Fraction: 1.8 Gy
Plan Total Fractions Prescribed: 28
Plan Total Fractions Prescribed: 28
Plan Total Prescribed Dose: 50.4 Gy
Plan Total Prescribed Dose: 50.4 Gy
Reference Point Dosage Given to Date: 18 Gy
Reference Point Dosage Given to Date: 18 Gy
Reference Point Session Dosage Given: 1.8 Gy
Reference Point Session Dosage Given: 1.8 Gy
Session Number: 10

## 2023-02-06 ENCOUNTER — Other Ambulatory Visit: Payer: Self-pay

## 2023-02-06 ENCOUNTER — Ambulatory Visit
Admission: RE | Admit: 2023-02-06 | Discharge: 2023-02-06 | Disposition: A | Discharge status: Home / Self Care | Payer: Medicaid Other | Source: Ambulatory Visit | Attending: Radiation Oncology

## 2023-02-06 DIAGNOSIS — Z510 Encounter for antineoplastic radiation therapy: Secondary | ICD-10-CM | POA: Diagnosis not present

## 2023-02-06 DIAGNOSIS — C50812 Malignant neoplasm of overlapping sites of left female breast: Secondary | ICD-10-CM | POA: Diagnosis not present

## 2023-02-06 DIAGNOSIS — Z170 Estrogen receptor positive status [ER+]: Secondary | ICD-10-CM | POA: Diagnosis not present

## 2023-02-06 LAB — RAD ONC ARIA SESSION SUMMARY
Course Elapsed Days: 16
Plan Fractions Treated to Date: 11
Plan Fractions Treated to Date: 11
Plan Prescribed Dose Per Fraction: 1.8 Gy
Plan Prescribed Dose Per Fraction: 1.8 Gy
Plan Total Fractions Prescribed: 28
Plan Total Fractions Prescribed: 28
Plan Total Prescribed Dose: 50.4 Gy
Plan Total Prescribed Dose: 50.4 Gy
Reference Point Dosage Given to Date: 19.8 Gy
Reference Point Dosage Given to Date: 19.8 Gy
Reference Point Session Dosage Given: 1.8 Gy
Reference Point Session Dosage Given: 1.8 Gy
Session Number: 11

## 2023-02-07 ENCOUNTER — Ambulatory Visit
Admission: RE | Admit: 2023-02-07 | Discharge: 2023-02-07 | Disposition: A | Discharge status: Home / Self Care | Payer: Medicaid Other | Source: Ambulatory Visit | Attending: Radiation Oncology | Admitting: Radiation Oncology

## 2023-02-07 ENCOUNTER — Other Ambulatory Visit: Payer: Self-pay

## 2023-02-07 DIAGNOSIS — Z510 Encounter for antineoplastic radiation therapy: Secondary | ICD-10-CM | POA: Diagnosis not present

## 2023-02-07 DIAGNOSIS — Z170 Estrogen receptor positive status [ER+]: Secondary | ICD-10-CM | POA: Diagnosis not present

## 2023-02-07 DIAGNOSIS — C50812 Malignant neoplasm of overlapping sites of left female breast: Secondary | ICD-10-CM | POA: Diagnosis not present

## 2023-02-07 LAB — RAD ONC ARIA SESSION SUMMARY
Course Elapsed Days: 17
Plan Fractions Treated to Date: 12
Plan Fractions Treated to Date: 12
Plan Prescribed Dose Per Fraction: 1.8 Gy
Plan Prescribed Dose Per Fraction: 1.8 Gy
Plan Total Fractions Prescribed: 28
Plan Total Fractions Prescribed: 28
Plan Total Prescribed Dose: 50.4 Gy
Plan Total Prescribed Dose: 50.4 Gy
Reference Point Dosage Given to Date: 21.6 Gy
Reference Point Dosage Given to Date: 21.6 Gy
Reference Point Session Dosage Given: 1.8 Gy
Reference Point Session Dosage Given: 1.8 Gy
Session Number: 12

## 2023-02-08 ENCOUNTER — Ambulatory Visit: Payer: Medicaid Other

## 2023-02-10 ENCOUNTER — Other Ambulatory Visit: Payer: Self-pay

## 2023-02-11 ENCOUNTER — Telehealth: Payer: Self-pay | Admitting: Radiation Oncology

## 2023-02-11 ENCOUNTER — Ambulatory Visit: Payer: Medicaid Other

## 2023-02-11 NOTE — Telephone Encounter (Signed)
PT LVM stating the need to cx today's tx appt due to being sick. VM forwarded to RTT support for contact.

## 2023-02-12 ENCOUNTER — Ambulatory Visit: Payer: Medicaid Other

## 2023-02-12 ENCOUNTER — Other Ambulatory Visit: Payer: Self-pay

## 2023-02-12 ENCOUNTER — Telehealth: Payer: Self-pay | Admitting: *Deleted

## 2023-02-12 NOTE — Telephone Encounter (Signed)
Spoke with the patient to see how she was feeling, she has missed her last 3 radiation treatments.  She states she has a cold and is not able to hold her breath to complete her treatment.  Denies fevers or contact with anyone who has covid.  She states she did a home covid test yesterday and it was negative.  She reports cough and congestion.  She was encouraged to stay hydrated and let us know if any other symptoms arise.  She plans to come for treatment tomorrow if she is feeling better.  Lind Covert RN, BSN

## 2023-02-13 ENCOUNTER — Ambulatory Visit: Payer: Medicaid Other

## 2023-02-14 ENCOUNTER — Ambulatory Visit: Payer: Medicaid Other

## 2023-02-14 ENCOUNTER — Other Ambulatory Visit: Payer: Self-pay

## 2023-02-15 ENCOUNTER — Ambulatory Visit: Payer: Medicaid Other

## 2023-02-15 ENCOUNTER — Other Ambulatory Visit: Payer: Self-pay

## 2023-02-17 ENCOUNTER — Other Ambulatory Visit: Payer: Self-pay

## 2023-02-18 ENCOUNTER — Telehealth: Payer: Self-pay | Admitting: Radiation Oncology

## 2023-02-18 ENCOUNTER — Ambulatory Visit: Payer: Medicaid Other

## 2023-02-18 ENCOUNTER — Ambulatory Visit (INDEPENDENT_AMBULATORY_CARE_PROVIDER_SITE_OTHER): Payer: Medicaid Other | Admitting: Nurse Practitioner

## 2023-02-18 ENCOUNTER — Encounter: Payer: Self-pay | Admitting: Nurse Practitioner

## 2023-02-18 ENCOUNTER — Other Ambulatory Visit: Payer: Self-pay | Admitting: Hematology and Oncology

## 2023-02-18 VITALS — BP 128/82 | HR 110 | Temp 98.0°F | Ht 67.0 in | Wt 339.2 lb

## 2023-02-18 DIAGNOSIS — J0100 Acute maxillary sinusitis, unspecified: Secondary | ICD-10-CM | POA: Diagnosis not present

## 2023-02-18 MED ORDER — PROMETHAZINE-DM 6.25-15 MG/5ML PO SYRP: 5.0000 mL | ORAL_SOLUTION | Freq: Four times a day (QID) | ORAL | 0 refills | Status: DC | PRN

## 2023-02-18 MED ORDER — AMOXICILLIN-POT CLAVULANATE 875-125 MG PO TABS: 1.0000 | ORAL_TABLET | Freq: Two times a day (BID) | ORAL | 0 refills | Status: DC

## 2023-02-18 MED ORDER — FLUCONAZOLE 150 MG PO TABS: ORAL_TABLET | ORAL | 0 refills | Status: DC

## 2023-02-18 NOTE — Telephone Encounter (Signed)
Pt called to cancel today's appt. Pt feels bad that she missed all tx last week but states she is unable to do breath hold and symptoms have only gotten worse. Pt states she did test negative for covid but will be going to the doctor today for evaluation. Pt will keep [place] updated on tomorrow's appt. Email sent to RTT Support, [place] [facility], and Dr. Joellen Jersey RN.Marland Kitchen

## 2023-02-18 NOTE — Progress Notes (Signed)
Acute Office Visit  Subjective:     Patient ID: Lynn [name], adult    DOB: December 05, 1985, 37 y.o.   MRN: [id number]  Chief Complaint  Patient presents with   Cough    10 days dry cough   HPI:  Patient is in today for cough for the last 10 days.   Discussed the use of AI scribe software for clinical note transcription with the patient, who gave verbal consent to proceed.  History of Present Illness   The patient, with a history of breast cancer currently undergoing radiation therapy, presents with a persistent cough and congestion. She reports that the cough is one of the worst she's experienced, describing it as a constant tickle that cough drops have been unable to alleviate. The cough was initially dry, but has since changed in character and is now productive. The patient also reports experiencing aches, chills, and a change in the color of her mucus to green/yellow, suggestive of a possible infection. She has been self-medicating with Sudafed for the past ten days and an expectorant, which only started showing effects three days ago. The patient also reports ear pain and sore throat, which has been exacerbated by the coughing. She denies having a fever. The patient has taken a home COVID test, which returned negative.      ROS See pertinent positives and negatives per HPI.     Objective:    BP 128/82 (BP Location: Right Wrist)   Pulse (!) 110   Temp 98 F (36.7 C) (Oral)   Ht 5\' 7"  (1.702 m)   Wt (!) 339 lb 3.2 oz (153.9 kg)   SpO2 95%   BMI 53.13 kg/m    Physical Exam Vitals and nursing note reviewed.  Constitutional:      General: She is not in acute distress.    Appearance: Normal appearance.  HENT:     Head: Normocephalic.     Right Ear: Ear canal normal. No middle ear effusion. Tympanic membrane is erythematous.     Left Ear: Hearing, tympanic membrane and ear canal normal.     Nose:     Right Sinus: Maxillary sinus tenderness present. No frontal sinus  tenderness.     Left Sinus: Maxillary sinus tenderness present. No frontal sinus tenderness.  Eyes:     Conjunctiva/sclera: Conjunctivae normal.  Cardiovascular:     Rate and Rhythm: Normal rate and regular rhythm.     Pulses: Normal pulses.     Heart sounds: Normal heart sounds.  Pulmonary:     Effort: Pulmonary effort is normal.     Breath sounds: Normal breath sounds.  Musculoskeletal:     Cervical back: Normal range of motion.  Skin:    General: Skin is warm.  Neurological:     General: No focal deficit present.     Mental Status: She is alert and oriented to person, place, and time.  Psychiatric:        Mood and Affect: Mood normal.        Behavior: Behavior normal.        Thought Content: Thought content normal.        Judgment: Judgment normal.       Assessment & Plan:      Sinusitis   She exhibits persistent cough, congestion, and postnasal drip for 10 days, with a recent change in sputum color to green/yellow, facial pain, and ear pain but no fever. She recently tested negative for COVID at home. We will  start Augmentin 875mg  twice daily for 10 days. Promethazine DM cough syrup every 4 hours as needed for cough will also be started, with a caution about potential drowsiness. After finishing antibiotics, Diflucan 150mg  will be taken once, with a second tablet 3 days later if needed, due to her history of yeast infections with antibiotics.      Meds ordered this encounter  Medications   amoxicillin-clavulanate (AUGMENTIN) 875-125 MG tablet    Sig: Take 1 tablet by mouth 2 (two) times daily.    Dispense:  20 tablet    Refill:  0   promethazine-dextromethorphan (PROMETHAZINE-DM) 6.25-15 MG/5ML syrup    Sig: Take 5 mLs by mouth 4 (four) times daily as needed for cough.    Dispense:  118 mL    Refill:  0   fluconazole (DIFLUCAN) 150 MG tablet    Sig: Take 1 tablet after finishing antibiotics and second tablet 3 days later if needed    Dispense:  2 tablet    Refill:  0     Return if symptoms worsen or fail to improve.  Gerre Scull, NP

## 2023-02-18 NOTE — Patient Instructions (Signed)
It was great to see you!  Start augmentin twice a day for 10 days  Take diflucan after finishing antibiotics and a second tablet 3 day later if needed  Start cough syrup every 4 hours as needed, this can make you sleepy  Keep drinking plenty of fluids.   Let's follow-up if your symptoms worsen or don't improve.   Take care,  Rodman Pickle, NP

## 2023-02-19 ENCOUNTER — Telehealth: Payer: Self-pay | Admitting: Radiation Oncology

## 2023-02-19 ENCOUNTER — Ambulatory Visit: Payer: Medicaid Other

## 2023-02-19 NOTE — Telephone Encounter (Signed)
I called and left a voicemail for the patient to encourage her to try to come in for her treatment tomorrow.

## 2023-02-20 ENCOUNTER — Ambulatory Visit: Payer: Medicaid Other | Attending: General Surgery

## 2023-02-20 ENCOUNTER — Ambulatory Visit: Payer: Medicaid Other

## 2023-02-20 ENCOUNTER — Ambulatory Visit
Admission: RE | Admit: 2023-02-20 | Discharge: 2023-02-20 | Disposition: A | Discharge status: Home / Self Care | Payer: Medicaid Other | Source: Ambulatory Visit | Attending: Radiation Oncology

## 2023-02-20 ENCOUNTER — Other Ambulatory Visit: Payer: Self-pay

## 2023-02-20 VITALS — Wt 338.4 lb

## 2023-02-20 DIAGNOSIS — C50812 Malignant neoplasm of overlapping sites of left female breast: Secondary | ICD-10-CM | POA: Diagnosis not present

## 2023-02-20 DIAGNOSIS — Z483 Aftercare following surgery for neoplasm: Secondary | ICD-10-CM | POA: Insufficient documentation

## 2023-02-20 DIAGNOSIS — Z170 Estrogen receptor positive status [ER+]: Secondary | ICD-10-CM | POA: Diagnosis not present

## 2023-02-20 DIAGNOSIS — Z510 Encounter for antineoplastic radiation therapy: Secondary | ICD-10-CM | POA: Diagnosis not present

## 2023-02-20 LAB — RAD ONC ARIA SESSION SUMMARY
Course Elapsed Days: 30
Plan Fractions Treated to Date: 13
Plan Fractions Treated to Date: 13
Plan Prescribed Dose Per Fraction: 1.8 Gy
Plan Prescribed Dose Per Fraction: 1.8 Gy
Plan Total Fractions Prescribed: 28
Plan Total Fractions Prescribed: 28
Plan Total Prescribed Dose: 50.4 Gy
Plan Total Prescribed Dose: 50.4 Gy
Reference Point Dosage Given to Date: 23.4 Gy
Reference Point Dosage Given to Date: 23.4 Gy
Reference Point Session Dosage Given: 1.8 Gy
Reference Point Session Dosage Given: 1.8 Gy
Session Number: 13

## 2023-02-20 NOTE — Therapy (Signed)
OUTPATIENT PHYSICAL THERAPY SOZO SCREENING NOTE   Patient Name: Lynn [name] MRN: [id number] DOB:1985/10/31, 37 y.o., adult Today's Date: 02/20/2023  PCP: Gerre Scull, NP REFERRING PROVIDER: Emelia Loron, MD   PT End of Session - 02/20/23 0805     Visit Number 5   # uncahnged due to screen only   PT Start Time 0801    PT Stop Time 0813    PT Time Calculation (min) 12 min    Activity Tolerance Patient tolerated treatment well    Behavior During Therapy [facility] for tasks assessed/performed             Past Medical History:  Diagnosis Date   Anemia    Cancer (HCC) 2024   L breast   Carpal tunnel syndrome, left    Depression    Diabetes (HCC)    Dysrhythmia    Palpitations   GERD (gastroesophageal reflux disease)    Hypertension    Iron deficiency    Panic attacks    PCOS (polycystic ovarian syndrome)    Pneumonia    Tachycardia    Umbilical hernia    Past Surgical History:  Procedure Laterality Date   ADENOIDECTOMY     As a child   AXILLARY LYMPH NODE DISSECTION Left 11/20/2022   Procedure: LEFT AXILLARY DISSECTION;  Surgeon: Emelia Loron, MD;  Location: [facility] OR;  Service: General;  Laterality: Left;   BREAST BIOPSY Left 05/29/2022   [place] LT BREAST BX W LOC DEV 1ST LESION IMG BX SPEC US GUIDE 05/29/2022 [facility]   BREAST BIOPSY Left 05/29/2022   [place] LT BREAST BX W LOC DEV EA ADD LESION IMG BX SPEC US GUIDE 05/29/2022 [facility]   BREAST BIOPSY Left 06/06/2022   MM LT BREAST BX W LOC DEV 1ST LESION IMAGE BX SPEC STEREO GUIDE 06/06/2022 [facility]   BREAST BIOPSY  11/19/2022   MM LT RADIOACTIVE SEED LOC MAMMO GUIDE 11/19/2022 [facility]   BREAST BIOPSY  11/19/2022   MM LT RADIOACTIVE SEED EA ADD LESION LOC MAMMO GUIDE 11/19/2022 [facility]   BREAST BIOPSY  11/19/2022   MM LT RADIOACTIVE SEED EA ADD LESION LOC MAMMO GUIDE 11/19/2022 [facility]   BREAST LUMPECTOMY WITH RADIOACTIVE SEED AND AXILLARY LYMPH NODE  DISSECTION Left 11/20/2022   Procedure: LEFT BREAST LUMPECTOMY WITH RADIOACTIVE SEED;  Surgeon: Emelia Loron, MD;  Location: [facility] OR;  Service: General;  Laterality: Left;  PEC BLOCK   CESAREAN SECTION  2015   PORT-A-CATH REMOVAL Right 11/20/2022   Procedure: REMOVAL PORT-A-CATH;  Surgeon: Emelia Loron, MD;  Location: [facility] OR;  Service: General;  Laterality: Right;   PORTACATH PLACEMENT N/A 06/19/2022   Procedure: INSERTION PORT-A-CATH;  Surgeon: Emelia Loron, MD;  Location: [facility] OR;  Service: General;  Laterality: N/A;   TONSILLECTOMY     As a child   Patient Active Problem List   Diagnosis Date Noted   Breast cancer (HCC) 11/20/2022   Port-A-Cath in place 07/31/2022   Genetic testing 06/15/2022   Anxiety and depression 06/11/2022   Malignant neoplasm of overlapping sites of left breast in female, estrogen receptor positive (HCC) 06/04/2022   Syncope and collapse 03/12/2022   Umbilical hernia without obstruction and without gangrene 03/12/2022   Vitamin D deficiency 03/12/2022   Cervical radicular pain 12/07/2021   Snoring 12/07/2021   Other fatigue 12/07/2021   Chest pain 08/15/2021   Epigastric pain 08/15/2021   Acute pain of both shoulders 04/21/2021   Nerve pain 04/21/2021   Morbid  obesity (HCC) 10/13/2020   Essential hypertension 03/12/2019   Chronic tachycardia 03/12/2019   Generalized anxiety disorder with panic attacks 03/12/2019   Family history of heart attack 03/12/2019   LVH (left ventricular hypertrophy) due to hypertensive disease, without heart failure 03/12/2019   Type 2 diabetes mellitus (HCC) 03/12/2019    REFERRING DIAG: left breast cancer at risk for lymphedema  THERAPY DIAG: Aftercare following surgery for neoplasm  PERTINENT HISTORY: Patient was diagnosed on 05/21/2022 with left grade 3 invasive ductal carcinoma breast cancer. It measures 3 cm and 7 mm (5 cm total) and is located in the upper outer quadrant. It is ER/PR positive and HER2  negative with a Ki67 of 60%. She has 5 abnormal appearing axillary lymph nodes and 1 was biopsied and is positive. She has a BMI > 50, hypertension, and diabetes. Left rotator cuff tear; needs surgery but is now postponed. Pt had neoadjuvant chemotherapy and she is s/p a Left lumpectomy with ALND and 0+/14 LN's. She will have radiation simulation on Sept 17, 2024   PRECAUTIONS: left UE Lymphedema risk, None  SUBJECTIVE: Pt returns for her first 3 month L-Dex screen. "I had to stop radiation for a little while because I was sick and couldn't take the deep breaths and coughed when I laid down. I've been feeling tight from radiation, can I come back to therapy?"  PAIN:  Are you having pain? No  SOZO SCREENING: Patient was assessed today using the SOZO machine to determine the lymphedema index score. This was compared to her baseline score. It was determined that she is within the recommended range when compared to her baseline and no further action is needed at this time. She will continue SOZO screenings. These are done every 3 months for 2 years post operatively followed by every 6 months for 2 years, and then annually.  Explained to pt that she can return for therapy when she wants to as her cert just needs to be renewed and to schedule with one of our PT's. She verbalized understanding. Also issued email for her to foolow up about her compression sleeve order that she was measured for.   L-DEX FLOWSHEETS - 02/20/23 0800       L-DEX LYMPHEDEMA SCREENING   Measurement Type Unilateral    L-DEX MEASUREMENT EXTREMITY Upper Extremity    POSITION  Standing    DOMINANT SIDE Right    At Risk Side Left    BASELINE SCORE (UNILATERAL) 1.8    L-DEX SCORE (UNILATERAL) 5.6    VALUE CHANGE (UNILAT) 3.8             L-DEX FLOWSHEETS - 02/20/23 0800       L-DEX LYMPHEDEMA SCREENING   Measurement Type Unilateral    L-DEX MEASUREMENT EXTREMITY Upper Extremity    POSITION  Standing    DOMINANT SIDE  Right    At Risk Side Left    BASELINE SCORE (UNILATERAL) 1.8    L-DEX SCORE (UNILATERAL) 5.6    VALUE CHANGE (UNILAT) 3.8               Hermenia Bers, PTA 02/20/2023, 10:06 AM

## 2023-02-21 ENCOUNTER — Ambulatory Visit
Admission: RE | Admit: 2023-02-21 | Discharge: 2023-02-21 | Disposition: A | Discharge status: Home / Self Care | Payer: Medicaid Other | Source: Ambulatory Visit | Attending: Radiation Oncology | Admitting: Radiation Oncology

## 2023-02-21 ENCOUNTER — Other Ambulatory Visit: Payer: Self-pay

## 2023-02-21 DIAGNOSIS — C50812 Malignant neoplasm of overlapping sites of left female breast: Secondary | ICD-10-CM | POA: Diagnosis not present

## 2023-02-21 DIAGNOSIS — Z510 Encounter for antineoplastic radiation therapy: Secondary | ICD-10-CM | POA: Diagnosis not present

## 2023-02-21 DIAGNOSIS — Z170 Estrogen receptor positive status [ER+]: Secondary | ICD-10-CM | POA: Diagnosis not present

## 2023-02-21 LAB — RAD ONC ARIA SESSION SUMMARY
Course Elapsed Days: 31
Plan Fractions Treated to Date: 14
Plan Fractions Treated to Date: 14
Plan Prescribed Dose Per Fraction: 1.8 Gy
Plan Prescribed Dose Per Fraction: 1.8 Gy
Plan Total Fractions Prescribed: 28
Plan Total Fractions Prescribed: 28
Plan Total Prescribed Dose: 50.4 Gy
Plan Total Prescribed Dose: 50.4 Gy
Reference Point Dosage Given to Date: 25.2 Gy
Reference Point Dosage Given to Date: 25.2 Gy
Reference Point Session Dosage Given: 1.8 Gy
Reference Point Session Dosage Given: 1.8 Gy
Session Number: 14

## 2023-02-22 ENCOUNTER — Ambulatory Visit
Admission: RE | Admit: 2023-02-22 | Discharge: 2023-02-22 | Disposition: A | Discharge status: Home / Self Care | Payer: Medicaid Other | Source: Ambulatory Visit | Attending: Radiation Oncology

## 2023-02-22 ENCOUNTER — Ambulatory Visit
Admission: RE | Admit: 2023-02-22 | Discharge: 2023-02-22 | Disposition: A | Discharge status: Home / Self Care | Payer: Medicaid Other | Source: Ambulatory Visit | Attending: Radiation Oncology | Admitting: Radiation Oncology

## 2023-02-22 ENCOUNTER — Other Ambulatory Visit: Payer: Self-pay

## 2023-02-22 ENCOUNTER — Ambulatory Visit: Payer: Medicaid Other | Admitting: Radiation Oncology

## 2023-02-22 DIAGNOSIS — C50812 Malignant neoplasm of overlapping sites of left female breast: Secondary | ICD-10-CM | POA: Diagnosis not present

## 2023-02-22 DIAGNOSIS — Z510 Encounter for antineoplastic radiation therapy: Secondary | ICD-10-CM | POA: Diagnosis not present

## 2023-02-22 DIAGNOSIS — Z170 Estrogen receptor positive status [ER+]: Secondary | ICD-10-CM | POA: Diagnosis not present

## 2023-02-22 LAB — RAD ONC ARIA SESSION SUMMARY
Course Elapsed Days: 32
Plan Fractions Treated to Date: 15
Plan Fractions Treated to Date: 15
Plan Prescribed Dose Per Fraction: 1.8 Gy
Plan Prescribed Dose Per Fraction: 1.8 Gy
Plan Total Fractions Prescribed: 28
Plan Total Fractions Prescribed: 28
Plan Total Prescribed Dose: 50.4 Gy
Plan Total Prescribed Dose: 50.4 Gy
Reference Point Dosage Given to Date: 27 Gy
Reference Point Dosage Given to Date: 27 Gy
Reference Point Session Dosage Given: 1.8 Gy
Reference Point Session Dosage Given: 1.8 Gy
Session Number: 15

## 2023-02-24 ENCOUNTER — Ambulatory Visit
Admission: RE | Admit: 2023-02-24 | Discharge: 2023-02-24 | Disposition: A | Discharge status: Home / Self Care | Payer: Medicaid Other | Source: Ambulatory Visit | Attending: Radiation Oncology | Admitting: Radiation Oncology

## 2023-02-24 ENCOUNTER — Other Ambulatory Visit: Payer: Self-pay

## 2023-02-24 DIAGNOSIS — Z170 Estrogen receptor positive status [ER+]: Secondary | ICD-10-CM | POA: Diagnosis not present

## 2023-02-24 DIAGNOSIS — Z510 Encounter for antineoplastic radiation therapy: Secondary | ICD-10-CM | POA: Diagnosis not present

## 2023-02-24 DIAGNOSIS — C50812 Malignant neoplasm of overlapping sites of left female breast: Secondary | ICD-10-CM | POA: Diagnosis not present

## 2023-02-24 LAB — RAD ONC ARIA SESSION SUMMARY
Course Elapsed Days: 34
Plan Fractions Treated to Date: 16
Plan Fractions Treated to Date: 16
Plan Prescribed Dose Per Fraction: 1.8 Gy
Plan Prescribed Dose Per Fraction: 1.8 Gy
Plan Total Fractions Prescribed: 28
Plan Total Fractions Prescribed: 28
Plan Total Prescribed Dose: 50.4 Gy
Plan Total Prescribed Dose: 50.4 Gy
Reference Point Dosage Given to Date: 28.8 Gy
Reference Point Dosage Given to Date: 28.8 Gy
Reference Point Session Dosage Given: 1.8 Gy
Reference Point Session Dosage Given: 1.8 Gy
Session Number: 16

## 2023-02-25 ENCOUNTER — Other Ambulatory Visit: Payer: Self-pay | Admitting: Nurse Practitioner

## 2023-02-25 ENCOUNTER — Other Ambulatory Visit: Payer: Self-pay

## 2023-02-25 ENCOUNTER — Ambulatory Visit
Admission: RE | Admit: 2023-02-25 | Discharge: 2023-02-25 | Disposition: A | Discharge status: Home / Self Care | Payer: Medicaid Other | Source: Ambulatory Visit | Attending: Radiation Oncology | Admitting: Radiation Oncology

## 2023-02-25 DIAGNOSIS — Z170 Estrogen receptor positive status [ER+]: Secondary | ICD-10-CM | POA: Diagnosis not present

## 2023-02-25 DIAGNOSIS — R000 Tachycardia, unspecified: Secondary | ICD-10-CM

## 2023-02-25 DIAGNOSIS — Z510 Encounter for antineoplastic radiation therapy: Secondary | ICD-10-CM | POA: Diagnosis not present

## 2023-02-25 DIAGNOSIS — I10 Essential (primary) hypertension: Secondary | ICD-10-CM

## 2023-02-25 DIAGNOSIS — C50812 Malignant neoplasm of overlapping sites of left female breast: Secondary | ICD-10-CM | POA: Diagnosis not present

## 2023-02-25 LAB — RAD ONC ARIA SESSION SUMMARY
Course Elapsed Days: 35
Plan Fractions Treated to Date: 17
Plan Fractions Treated to Date: 17
Plan Prescribed Dose Per Fraction: 1.8 Gy
Plan Prescribed Dose Per Fraction: 1.8 Gy
Plan Total Fractions Prescribed: 28
Plan Total Fractions Prescribed: 28
Plan Total Prescribed Dose: 50.4 Gy
Plan Total Prescribed Dose: 50.4 Gy
Reference Point Dosage Given to Date: 30.6 Gy
Reference Point Dosage Given to Date: 30.6 Gy
Reference Point Session Dosage Given: 1.8 Gy
Reference Point Session Dosage Given: 1.8 Gy
Session Number: 17

## 2023-02-25 NOTE — Telephone Encounter (Signed)
Requesting: CARVEDILOL 25MG  TABLETS  Last Visit: 02/18/2023 Next Visit: Visit date not found Last Refill: 11/29/2022  Please Advise

## 2023-02-26 ENCOUNTER — Other Ambulatory Visit: Payer: Self-pay

## 2023-02-26 ENCOUNTER — Ambulatory Visit
Admission: RE | Admit: 2023-02-26 | Discharge: 2023-02-26 | Disposition: A | Discharge status: Home / Self Care | Payer: Medicaid Other | Source: Ambulatory Visit | Attending: Radiation Oncology | Admitting: Radiation Oncology

## 2023-02-26 DIAGNOSIS — C50812 Malignant neoplasm of overlapping sites of left female breast: Secondary | ICD-10-CM | POA: Diagnosis not present

## 2023-02-26 DIAGNOSIS — Z510 Encounter for antineoplastic radiation therapy: Secondary | ICD-10-CM | POA: Diagnosis not present

## 2023-02-26 DIAGNOSIS — Z170 Estrogen receptor positive status [ER+]: Secondary | ICD-10-CM | POA: Diagnosis not present

## 2023-02-26 LAB — RAD ONC ARIA SESSION SUMMARY
Course Elapsed Days: 36
Plan Fractions Treated to Date: 18
Plan Fractions Treated to Date: 18
Plan Prescribed Dose Per Fraction: 1.8 Gy
Plan Prescribed Dose Per Fraction: 1.8 Gy
Plan Total Fractions Prescribed: 28
Plan Total Fractions Prescribed: 28
Plan Total Prescribed Dose: 50.4 Gy
Plan Total Prescribed Dose: 50.4 Gy
Reference Point Dosage Given to Date: 32.4 Gy
Reference Point Dosage Given to Date: 32.4 Gy
Reference Point Session Dosage Given: 1.8 Gy
Reference Point Session Dosage Given: 1.8 Gy
Session Number: 18

## 2023-02-27 ENCOUNTER — Other Ambulatory Visit: Payer: Self-pay

## 2023-02-27 ENCOUNTER — Ambulatory Visit
Admission: RE | Admit: 2023-02-27 | Discharge: 2023-02-27 | Disposition: A | Discharge status: Home / Self Care | Payer: Medicaid Other | Source: Ambulatory Visit | Attending: Radiation Oncology | Admitting: Radiation Oncology

## 2023-02-27 DIAGNOSIS — Z170 Estrogen receptor positive status [ER+]: Secondary | ICD-10-CM | POA: Diagnosis not present

## 2023-02-27 DIAGNOSIS — C50812 Malignant neoplasm of overlapping sites of left female breast: Secondary | ICD-10-CM | POA: Diagnosis not present

## 2023-02-27 DIAGNOSIS — Z510 Encounter for antineoplastic radiation therapy: Secondary | ICD-10-CM | POA: Diagnosis not present

## 2023-02-27 LAB — RAD ONC ARIA SESSION SUMMARY
Course Elapsed Days: 37
Plan Fractions Treated to Date: 19
Plan Fractions Treated to Date: 19
Plan Prescribed Dose Per Fraction: 1.8 Gy
Plan Prescribed Dose Per Fraction: 1.8 Gy
Plan Total Fractions Prescribed: 28
Plan Total Fractions Prescribed: 28
Plan Total Prescribed Dose: 50.4 Gy
Plan Total Prescribed Dose: 50.4 Gy
Reference Point Dosage Given to Date: 34.2 Gy
Reference Point Dosage Given to Date: 34.2 Gy
Reference Point Session Dosage Given: 1.8 Gy
Reference Point Session Dosage Given: 1.8 Gy
Session Number: 19

## 2023-03-04 ENCOUNTER — Telehealth: Payer: Self-pay

## 2023-03-04 ENCOUNTER — Ambulatory Visit
Admission: RE | Admit: 2023-03-04 | Discharge: 2023-03-04 | Disposition: A | Discharge status: Home / Self Care | Payer: BC Managed Care – PPO | Source: Ambulatory Visit | Attending: Radiation Oncology | Admitting: Radiation Oncology

## 2023-03-04 ENCOUNTER — Other Ambulatory Visit (HOSPITAL_COMMUNITY): Payer: Self-pay

## 2023-03-04 ENCOUNTER — Encounter: Payer: Self-pay | Admitting: Hematology and Oncology

## 2023-03-04 ENCOUNTER — Inpatient Hospital Stay: Payer: BC Managed Care – PPO | Attending: Hematology and Oncology | Admitting: Hematology and Oncology

## 2023-03-04 ENCOUNTER — Other Ambulatory Visit: Payer: Self-pay

## 2023-03-04 ENCOUNTER — Telehealth: Payer: Self-pay | Admitting: Pharmacy Technician

## 2023-03-04 ENCOUNTER — Ambulatory Visit: Payer: BC Managed Care – PPO

## 2023-03-04 DIAGNOSIS — Z510 Encounter for antineoplastic radiation therapy: Secondary | ICD-10-CM | POA: Insufficient documentation

## 2023-03-04 DIAGNOSIS — Z923 Personal history of irradiation: Secondary | ICD-10-CM | POA: Insufficient documentation

## 2023-03-04 DIAGNOSIS — Z170 Estrogen receptor positive status [ER+]: Secondary | ICD-10-CM | POA: Insufficient documentation

## 2023-03-04 DIAGNOSIS — C50812 Malignant neoplasm of overlapping sites of left female breast: Secondary | ICD-10-CM | POA: Insufficient documentation

## 2023-03-04 DIAGNOSIS — Z1721 Progesterone receptor positive status: Secondary | ICD-10-CM | POA: Insufficient documentation

## 2023-03-04 DIAGNOSIS — Z1732 Human epidermal growth factor receptor 2 negative status: Secondary | ICD-10-CM | POA: Diagnosis not present

## 2023-03-04 DIAGNOSIS — Z9221 Personal history of antineoplastic chemotherapy: Secondary | ICD-10-CM | POA: Insufficient documentation

## 2023-03-04 LAB — RAD ONC ARIA SESSION SUMMARY
Course Elapsed Days: 42
Plan Fractions Treated to Date: 20
Plan Fractions Treated to Date: 20
Plan Prescribed Dose Per Fraction: 1.8 Gy
Plan Prescribed Dose Per Fraction: 1.8 Gy
Plan Total Fractions Prescribed: 28
Plan Total Fractions Prescribed: 28
Plan Total Prescribed Dose: 50.4 Gy
Plan Total Prescribed Dose: 50.4 Gy
Reference Point Dosage Given to Date: 36 Gy
Reference Point Dosage Given to Date: 36 Gy
Reference Point Session Dosage Given: 1.8 Gy
Reference Point Session Dosage Given: 1.8 Gy
Session Number: 20

## 2023-03-04 MED ORDER — ANASTROZOLE 1 MG PO TABS: 1.0000 mg | ORAL_TABLET | Freq: Every day | ORAL | 3 refills | Status: DC

## 2023-03-04 MED ORDER — ABEMACICLIB 100 MG PO TABS
100.0000 mg | ORAL_TABLET | Freq: Two times a day (BID) | ORAL | 3 refills | Status: DC
  Filled 2023-03-19 – 2023-03-29 (×2): qty 56, 28d supply, fill #0
  Filled 2023-04-23: qty 56, 28d supply, fill #1
  Filled 2023-05-28: qty 56, 28d supply, fill #2
  Filled 2023-06-24: qty 56, 28d supply, fill #3

## 2023-03-04 NOTE — Telephone Encounter (Signed)
Oral Oncology Patient Advocate Encounter  After completing a benefits investigation, prior authorization for Verzenio is not required at this time through [employer].  Patient's copay is $4.     Jinger Neighbors, CPhT-Adv Oncology Pharmacy Patient Advocate [facility] Direct Number: [phone]  Fax: [phone]

## 2023-03-04 NOTE — Assessment & Plan Note (Addendum)
stage IIIB ER/PR positive breast cancer diagnosed in 06/2022 currently receiving neoadjuvant chemotherapy.   06/27/2022: CT CAP: Left breast cancer, 2 left axillary lymph nodes, left adnexal cystic lesion 4 cm    Treatment Plan:  1. Neoadjuvant chemotherapy with Adriamycin and Cytoxan dose dense 4 followed by Taxol weekly 10 completed 10/16/2022 2. 11/20/2022: Left lumpectomy: No residual cancer, margins negative, 0/14 lymph nodes 3. Followed by adjuvant radiation therapy 01/22/2023-03/21/2023 4.  Followed by antiestrogen therapy to start 04/03/2023 ____________________________________________________ Antiestrogen therapy: I recommended ovarian function suppression with Zoladex along with anastrozole with Verzenio. Discussed the pros and cons of ovarian function suppression. Abemaciclib counseling: I discussed at length the risks and benefits of Abemaciclib in combination with letrozole. Adverse effects of Abemaciclib include decreasing neutrophil count, pneumonia, blood clots in lungs as well as nausea and GI symptoms. Side effects of letrozole include hot flashes, muscle aches and pains, uterine bleeding/spotting/cancer, osteoporosis, risk of blood clots.  Return to clinic to start antiestrogen therapy after radiation is completed.

## 2023-03-04 NOTE — Telephone Encounter (Signed)
 Per md orders entered for [employer] and all supported documents faxed to [phone]. Faxed confirmation was received.

## 2023-03-04 NOTE — Progress Notes (Signed)
Patient Care Team: Gerre Scull, NP as PCP - General (Internal Medicine) Gweneth Dimitri, MD as Consulting Physician (Family Medicine) Gweneth Dimitri, MD as Consulting Physician (Family Medicine) Serena Croissant, MD as Consulting Physician (Hematology and Oncology) Emelia Loron, MD as Consulting Physician (General Surgery) Dorothy Puffer, MD as Consulting Physician (Radiation Oncology) Donnelly Angelica, RN as Oncology Nurse Navigator Pershing Proud, RN as Oncology Nurse Navigator  DIAGNOSIS:  Encounter Diagnosis  Name Primary?   Malignant neoplasm of overlapping sites of left breast in female, estrogen receptor positive (HCC)     SUMMARY OF ONCOLOGIC HISTORY: Oncology History  Malignant neoplasm of overlapping sites of left breast in female, estrogen receptor positive (HCC)  05/29/2022 Initial Diagnosis   Palpable abnormalities in the left breast led to mammograms which revealed 3 masses 0.7 cm at 1 o'clock position, 3 cm at 2 o'clock position, additional nodule 8 mm total span 5 cm, 5 abnormal axillary lymph nodes biopsy of the masses and the lymph node were positive for grade 3 IDC with high-grade DCIS with LVI, ER 95%, PR 40%, Ki-67 60%, HER2 2+ by IHC FISH negative   06/15/2022 Cancer Staging   Staging form: Breast, AJCC 8th Edition - Clinical: Stage IIIB (cT3, cN3, cM0, G3, ER+, PR+, HER2-) - Signed by Loa Socks, NP on 06/15/2022 Stage prefix: Initial diagnosis Histologic grading system: 3 grade system   06/15/2022 Breast MRI   IMPRESSION: 1. Multiple sites of biopsy proven malignancy with in the lateral left breast. The overall conglomerate of suspicious findings spans approximately 4.4 x 3.5 x 3.5 cm, and contains all 3 post biopsy marking clips. 2. 10+ morphologically abnormal level I, II, III and Rotter's lymph nodes within the left axilla. 3. Diffuse skin and trabecular thickening on the left, consistent with lymphovascular spread of disease. 4. No  MRI evidence of malignancy on the right.   06/20/2022 - 10/09/2022 Chemotherapy   Patient is on Treatment Plan : BREAST ADJUVANT DOSE DENSE AC q14d / PACLitaxel q7d      Genetic Testing   [facility] Multi-Cancer Panel+RNA was Negative. Report date is 06/13/2022.  The Multi-Cancer + RNA Panel offered by [facility] includes sequencing and/or deletion/duplication analysis of the following 70 genes:  AIP*, ALK, APC*, ATM*, AXIN2*, BAP1*, BARD1*, BLM*, BMPR1A*, BRCA1*, BRCA2*, BRIP1*, CDC73*, CDH1*, CDK4, CDKN1B*, CDKN2A, CHEK2*, CTNNA1*, DICER1*, EPCAM (del/dup only), EGFR, FH*, FLCN*, GREM1 (promoter dup only), HOXB13, KIT, LZTR1, MAX*, MBD4, MEN1*, MET, MITF, MLH1*, MSH2*, MSH3*, MSH6*, MUTYH*, NF1*, NF2*, NTHL1*, PALB2*, PDGFRA, PMS2*, POLD1*, POLE*, POT1*, PRKAR1A*, PTCH1*, PTEN*, RAD51C*, RAD51D*, RB1*, RET, SDHA* (sequencing only), SDHAF2*, SDHB*, SDHC*, SDHD*, SMAD4*, SMARCA4*, SMARCB1*, SMARCE1*, STK11*, SUFU*, TMEM127*, TP53*, TSC1*, TSC2*, VHL*. RNA analysis is performed for * genes.   01/22/2023 - 03/21/2023 Radiation Therapy   Adjuvant radiation therapy     CHIEF COMPLIANT: Follow-up to discuss antiestrogen treatment options  HISTORY OF PRESENT ILLNESS:   History of Present Illness   The patient, with a history of breast cancer, is completing a course of radiation therapy. Shes experiencing radiation dermatitis. Shes here to discuss anti estrogen treatment options The patient is aware of the potential side effects of these medications, including menopausal symptoms, joint stiffness, bone loss, and diarrhea. The patient is also dealing with a family situation, as her sister is in end-stage renal failure and is considering hospice care.         ALLERGIES:  is allergic to macrobid [nitrofurantoin macrocrystal] and ibuprofen.  MEDICATIONS:  Current Outpatient Medications  Medication  Sig Dispense Refill   [START ON 04/09/2023] abemaciclib (VERZENIO) 100 MG tablet Take 1 tablet (100 mg total) by  mouth 2 (two) times daily. 60 tablet 3   ALPRAZolam (XANAX) 0.5 MG tablet TAKE 1 TABLET BY MOUTH AT BEDTIME AS NEEDED FOR ANXIETY 30 tablet 3   amoxicillin-clavulanate (AUGMENTIN) 875-125 MG tablet Take 1 tablet by mouth 2 (two) times daily. 20 tablet 0   aspirin EC 325 MG tablet Take 325 mg by mouth in the morning.     aspirin-acetaminophen-caffeine (EXCEDRIN MIGRAINE) 250-250-65 MG tablet Take 1-2 tablets by mouth 2 (two) times daily as needed for headache or migraine.     Blood Glucose Monitoring Suppl (ACCU-CHEK AVIVA PLUS) w/Device KIT 1 each by Does not apply route daily at 6 (six) AM. (Patient not taking: Reported on 02/18/2023) 1 kit 0   carvedilol (COREG) 25 MG tablet TAKE 1 TABLET(25 MG) BY MOUTH TWICE DAILY WITH A MEAL 180 tablet 0   cyclobenzaprine (FLEXERIL) 10 MG tablet Take 1 tablet (10 mg total) by mouth 3 (three) times daily as needed for muscle spasms. (Patient not taking: Reported on 02/18/2023) 30 tablet 1   famotidine (PEPCID) 20 MG tablet TAKE 1 TABLET(20 MG) BY MOUTH TWICE DAILY 180 tablet 0   fluconazole (DIFLUCAN) 150 MG tablet Take 1 tablet after finishing antibiotics and second tablet 3 days later if needed 2 tablet 0   glucose blood (ACCU-CHEK AVIVA PLUS) test strip Use as instructed (Patient not taking: Reported on 02/18/2023) 100 each 12   lidocaine (XYLOCAINE) 2 % jelly Apply 1 Application topically as needed. (Patient not taking: Reported on 11/09/2022) 85 g 0   magic mouthwash (lidocaine, diphenhydrAMINE, alum & mag hydroxide) suspension Take 5 mLs by mouth 3 (three) times daily as needed for mouth pain. (Patient not taking: Reported on 11/09/2022) 120 mL 0   methocarbamol (ROBAXIN) 750 MG tablet Take 1 tablet (750 mg total) by mouth 4 (four) times daily as needed (use for muscle cramps/pain). (Patient not taking: Reported on 02/18/2023) 30 tablet 0   ONE TOUCH CLUB LANCETS MISC 1 each by Does not apply route daily. (Patient not taking: Reported on 02/18/2023) 100 each 11    promethazine-dextromethorphan (PROMETHAZINE-DM) 6.25-15 MG/5ML syrup Take 5 mLs by mouth 4 (four) times daily as needed for cough. 118 mL 0   Semaglutide,0.25 or 0.5MG /DOS, (OZEMPIC, 0.25 OR 0.5 MG/DOSE,) 2 MG/3ML SOPN Inject 0.5 mg into the skin once a week. 3 mL 2   traMADol (ULTRAM) 50 MG tablet Take 1 tablet (50 mg total) by mouth every 6 (six) hours as needed. (Patient not taking: Reported on 02/18/2023) 15 tablet 0   No current facility-administered medications for this visit.    PHYSICAL EXAMINATION: ECOG PERFORMANCE STATUS: 1 - Symptomatic but completely ambulatory  Vitals:   03/04/23 1544  BP: (!) 153/88  Pulse: (!) 101  Resp: 18  Temp: (!) 97.4 F (36.3 C)  SpO2: 99%   Filed Weights   03/04/23 1544  Weight: (!) 341 lb (154.7 kg)     LABORATORY DATA:  I have reviewed the data as listed    Latest Ref Rng & Units 11/15/2022    8:22 AM 10/16/2022    8:13 AM 10/09/2022    1:23 PM  CMP  Glucose 70 - 99 mg/dL 578  469  629   BUN 6 - 20 mg/dL 12  15  13    Creatinine 0.44 - 1.00 mg/dL 5.28  4.13  2.44   Sodium 135 -  145 mmol/L 135  136  135   Potassium 3.5 - 5.1 mmol/L 4.1  4.3  4.3   Chloride 98 - 111 mmol/L 101  104  103   CO2 22 - 32 mmol/L 25  25  26    Calcium 8.9 - 10.3 mg/dL 8.5  8.9  8.8   Total Protein 6.5 - 8.1 g/dL  6.8  7.0   Total Bilirubin 0.3 - 1.2 mg/dL  0.5  0.6   Alkaline Phos 38 - 126 U/L  61  62   AST 15 - 41 U/L  17  21   ALT 0 - 44 U/L  22  27     Lab Results  Component Value Date   WBC 6.3 11/15/2022   HGB 11.4 (L) 11/15/2022   HCT 35.0 (L) 11/15/2022   MCV 84.3 11/15/2022   PLT 235 11/15/2022   NEUTROABS 4.3 10/16/2022    ASSESSMENT & PLAN:  Malignant neoplasm of overlapping sites of left breast in female, 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   06/27/2022: CT CAP: Left breast cancer, 2 left axillary lymph nodes, left adnexal cystic  lesion 4 cm    Treatment Plan:  1. Neoadjuvant chemotherapy with Adriamycin and Cytoxan dose dense 4 followed by Taxol weekly 10 completed 10/16/2022 2. 11/20/2022: Left lumpectomy: No residual cancer, margins negative, 0/14 lymph nodes 3. Followed by adjuvant radiation therapy 01/22/2023-03/21/2023 4.  Followed by antiestrogen therapy to start 04/03/2023 ____________________________________________________ Antiestrogen therapy: I recommended ovarian function suppression with Zoladex along with anastrozole with Verzenio. Discussed the pros and cons of ovarian function suppression. Abemaciclib counseling: I discussed at length the risks and benefits of Abemaciclib in combination with letrozole. Adverse effects of Abemaciclib include decreasing neutrophil count, pneumonia, blood clots in lungs as well as nausea and GI symptoms. Side effects of letrozole include hot flashes, muscle aches and pains, uterine bleeding/spotting/cancer, osteoporosis, risk of blood clots.  If she undergoes oophorectomy send she will not need Zoladex from that point onwards. Return to clinic to start antiestrogen therapy after radiation is completed.  Patient will see Jonny Ruiz for education regarding Verzinio.  Plan to start Verzinio 04/09/2023. We will alternate follow-ups with her.  No orders of the defined types were placed in this encounter.  The patient has a good understanding of the overall plan. she agrees with it. she will call with any problems that may develop before the next visit here. Total time spent: 30 mins including face to face time and time spent for planning, charting and co-ordination of care   Tamsen Meek, MD 03/04/23

## 2023-03-05 ENCOUNTER — Ambulatory Visit: Payer: BC Managed Care – PPO

## 2023-03-05 ENCOUNTER — Other Ambulatory Visit: Payer: Self-pay

## 2023-03-05 ENCOUNTER — Ambulatory Visit
Admission: RE | Admit: 2023-03-05 | Discharge: 2023-03-05 | Disposition: A | Discharge status: Home / Self Care | Payer: BC Managed Care – PPO | Source: Ambulatory Visit | Attending: Radiation Oncology | Admitting: Radiation Oncology

## 2023-03-05 DIAGNOSIS — Z170 Estrogen receptor positive status [ER+]: Secondary | ICD-10-CM | POA: Diagnosis not present

## 2023-03-05 DIAGNOSIS — Z510 Encounter for antineoplastic radiation therapy: Secondary | ICD-10-CM | POA: Diagnosis not present

## 2023-03-05 DIAGNOSIS — C50812 Malignant neoplasm of overlapping sites of left female breast: Secondary | ICD-10-CM | POA: Diagnosis not present

## 2023-03-05 LAB — RAD ONC ARIA SESSION SUMMARY
Course Elapsed Days: 43
Plan Fractions Treated to Date: 21
Plan Fractions Treated to Date: 21
Plan Prescribed Dose Per Fraction: 1.8 Gy
Plan Prescribed Dose Per Fraction: 1.8 Gy
Plan Total Fractions Prescribed: 28
Plan Total Fractions Prescribed: 28
Plan Total Prescribed Dose: 50.4 Gy
Plan Total Prescribed Dose: 50.4 Gy
Reference Point Dosage Given to Date: 37.8 Gy
Reference Point Dosage Given to Date: 37.8 Gy
Reference Point Session Dosage Given: 1.8 Gy
Reference Point Session Dosage Given: 1.8 Gy
Session Number: 21

## 2023-03-06 ENCOUNTER — Ambulatory Visit: Payer: BC Managed Care – PPO

## 2023-03-06 ENCOUNTER — Other Ambulatory Visit: Payer: Self-pay

## 2023-03-06 ENCOUNTER — Ambulatory Visit
Admission: RE | Admit: 2023-03-06 | Discharge: 2023-03-06 | Disposition: A | Discharge status: Home / Self Care | Payer: BC Managed Care – PPO | Source: Ambulatory Visit | Attending: Radiation Oncology | Admitting: Radiation Oncology

## 2023-03-06 DIAGNOSIS — C50812 Malignant neoplasm of overlapping sites of left female breast: Secondary | ICD-10-CM | POA: Diagnosis not present

## 2023-03-06 DIAGNOSIS — Z510 Encounter for antineoplastic radiation therapy: Secondary | ICD-10-CM | POA: Diagnosis not present

## 2023-03-06 DIAGNOSIS — Z170 Estrogen receptor positive status [ER+]: Secondary | ICD-10-CM | POA: Diagnosis not present

## 2023-03-06 LAB — RAD ONC ARIA SESSION SUMMARY
Course Elapsed Days: 44
Plan Fractions Treated to Date: 22
Plan Fractions Treated to Date: 22
Plan Prescribed Dose Per Fraction: 1.8 Gy
Plan Prescribed Dose Per Fraction: 1.8 Gy
Plan Total Fractions Prescribed: 28
Plan Total Fractions Prescribed: 28
Plan Total Prescribed Dose: 50.4 Gy
Plan Total Prescribed Dose: 50.4 Gy
Reference Point Dosage Given to Date: 39.6 Gy
Reference Point Dosage Given to Date: 39.6 Gy
Reference Point Session Dosage Given: 1.8 Gy
Reference Point Session Dosage Given: 1.8 Gy
Session Number: 22

## 2023-03-07 ENCOUNTER — Ambulatory Visit
Admission: RE | Admit: 2023-03-07 | Discharge: 2023-03-07 | Disposition: A | Discharge status: Home / Self Care | Payer: BC Managed Care – PPO | Source: Ambulatory Visit | Attending: Radiation Oncology | Admitting: Radiation Oncology

## 2023-03-07 ENCOUNTER — Other Ambulatory Visit: Payer: Self-pay

## 2023-03-07 ENCOUNTER — Ambulatory Visit: Payer: BC Managed Care – PPO

## 2023-03-07 DIAGNOSIS — Z170 Estrogen receptor positive status [ER+]: Secondary | ICD-10-CM | POA: Diagnosis not present

## 2023-03-07 DIAGNOSIS — C50812 Malignant neoplasm of overlapping sites of left female breast: Secondary | ICD-10-CM | POA: Diagnosis not present

## 2023-03-07 DIAGNOSIS — Z510 Encounter for antineoplastic radiation therapy: Secondary | ICD-10-CM | POA: Diagnosis not present

## 2023-03-07 LAB — RAD ONC ARIA SESSION SUMMARY
Course Elapsed Days: 45
Plan Fractions Treated to Date: 23
Plan Fractions Treated to Date: 23
Plan Prescribed Dose Per Fraction: 1.8 Gy
Plan Prescribed Dose Per Fraction: 1.8 Gy
Plan Total Fractions Prescribed: 28
Plan Total Fractions Prescribed: 28
Plan Total Prescribed Dose: 50.4 Gy
Plan Total Prescribed Dose: 50.4 Gy
Reference Point Dosage Given to Date: 41.4 Gy
Reference Point Dosage Given to Date: 41.4 Gy
Reference Point Session Dosage Given: 1.8 Gy
Reference Point Session Dosage Given: 1.8 Gy
Session Number: 23

## 2023-03-08 ENCOUNTER — Ambulatory Visit: Payer: BC Managed Care – PPO

## 2023-03-08 ENCOUNTER — Other Ambulatory Visit: Payer: Self-pay

## 2023-03-08 ENCOUNTER — Ambulatory Visit
Admission: RE | Admit: 2023-03-08 | Discharge: 2023-03-08 | Disposition: A | Discharge status: Home / Self Care | Payer: BC Managed Care – PPO | Source: Ambulatory Visit | Attending: Radiation Oncology | Admitting: Radiation Oncology

## 2023-03-08 DIAGNOSIS — Z170 Estrogen receptor positive status [ER+]: Secondary | ICD-10-CM | POA: Diagnosis not present

## 2023-03-08 DIAGNOSIS — Z510 Encounter for antineoplastic radiation therapy: Secondary | ICD-10-CM | POA: Diagnosis not present

## 2023-03-08 DIAGNOSIS — C50812 Malignant neoplasm of overlapping sites of left female breast: Secondary | ICD-10-CM | POA: Diagnosis not present

## 2023-03-08 LAB — RAD ONC ARIA SESSION SUMMARY
Course Elapsed Days: 46
Plan Fractions Treated to Date: 24
Plan Fractions Treated to Date: 24
Plan Prescribed Dose Per Fraction: 1.8 Gy
Plan Prescribed Dose Per Fraction: 1.8 Gy
Plan Total Fractions Prescribed: 28
Plan Total Fractions Prescribed: 28
Plan Total Prescribed Dose: 50.4 Gy
Plan Total Prescribed Dose: 50.4 Gy
Reference Point Dosage Given to Date: 43.2 Gy
Reference Point Dosage Given to Date: 43.2 Gy
Reference Point Session Dosage Given: 1.8 Gy
Reference Point Session Dosage Given: 1.8 Gy
Session Number: 24

## 2023-03-11 ENCOUNTER — Ambulatory Visit: Payer: BC Managed Care – PPO

## 2023-03-11 ENCOUNTER — Ambulatory Visit (INDEPENDENT_AMBULATORY_CARE_PROVIDER_SITE_OTHER): Payer: BC Managed Care – PPO

## 2023-03-11 ENCOUNTER — Other Ambulatory Visit: Payer: Self-pay

## 2023-03-11 ENCOUNTER — Encounter (HOSPITAL_COMMUNITY): Payer: Self-pay | Admitting: *Deleted

## 2023-03-11 ENCOUNTER — Other Ambulatory Visit: Payer: Self-pay | Admitting: Nurse Practitioner

## 2023-03-11 ENCOUNTER — Ambulatory Visit
Admission: RE | Admit: 2023-03-11 | Discharge: 2023-03-11 | Disposition: A | Discharge status: Home / Self Care | Payer: BC Managed Care – PPO | Source: Ambulatory Visit | Attending: Radiation Oncology | Admitting: Radiation Oncology

## 2023-03-11 ENCOUNTER — Ambulatory Visit (HOSPITAL_COMMUNITY)
Admission: EM | Admit: 2023-03-11 | Discharge: 2023-03-11 | Disposition: A | Discharge status: Home / Self Care | Payer: BC Managed Care – PPO | Attending: Family Medicine | Admitting: Family Medicine

## 2023-03-11 DIAGNOSIS — R091 Pleurisy: Secondary | ICD-10-CM

## 2023-03-11 DIAGNOSIS — R051 Acute cough: Secondary | ICD-10-CM | POA: Diagnosis not present

## 2023-03-11 DIAGNOSIS — C50812 Malignant neoplasm of overlapping sites of left female breast: Secondary | ICD-10-CM | POA: Diagnosis not present

## 2023-03-11 DIAGNOSIS — Z510 Encounter for antineoplastic radiation therapy: Secondary | ICD-10-CM | POA: Diagnosis not present

## 2023-03-11 DIAGNOSIS — Z170 Estrogen receptor positive status [ER+]: Secondary | ICD-10-CM | POA: Diagnosis not present

## 2023-03-11 LAB — RAD ONC ARIA SESSION SUMMARY
Course Elapsed Days: 49
Plan Fractions Treated to Date: 25
Plan Fractions Treated to Date: 25
Plan Prescribed Dose Per Fraction: 1.8 Gy
Plan Prescribed Dose Per Fraction: 1.8 Gy
Plan Total Fractions Prescribed: 28
Plan Total Fractions Prescribed: 28
Plan Total Prescribed Dose: 50.4 Gy
Plan Total Prescribed Dose: 50.4 Gy
Reference Point Dosage Given to Date: 45 Gy
Reference Point Dosage Given to Date: 45 Gy
Reference Point Session Dosage Given: 1.8 Gy
Reference Point Session Dosage Given: 1.8 Gy
Session Number: 25

## 2023-03-11 MED ORDER — TRAMADOL HCL 50 MG PO TABS: 50.0000 mg | ORAL_TABLET | Freq: Four times a day (QID) | ORAL | 0 refills | Status: DC | PRN

## 2023-03-11 MED ORDER — METHYLPREDNISOLONE 4 MG PO TBPK
ORAL_TABLET | ORAL | 0 refills | Status: DC
Start: 2023-03-11 — End: 2023-03-20

## 2023-03-11 MED ORDER — BENZONATATE 100 MG PO CAPS: 100.0000 mg | ORAL_CAPSULE | Freq: Three times a day (TID) | ORAL | 0 refills | Status: DC | PRN

## 2023-03-11 NOTE — ED Provider Notes (Signed)
[facility]    CSN: [id number] Arrival date & time: 03/11/23  4098      History   Chief Complaint Chief Complaint  Patient presents with   Cough   Sore Throat    HPI Lynn [name] is a 37 y.o. female.    Cough Sore Throat  Here for dry cough and now right-sided pleuritic chest pain.  For about 10 days she has had a sore throat that is only bothers her at night, and she attributed it to helping a lot on her own at her new job.  She is also had a dry cough without any sputum production.  No fever or chills and no nasal congestion or rhinorrhea.  She states that about 1 month ago she had some rhinorrhea and congestion and cough and ended up having some fever and was treated for a sinus infection about November 18 with some amoxicillin.  Those symptoms did improve and she was left just with a little bit of cough.  She is undergoing left-sided chest radiation for history of breast cancer.  She received chemotherapy in the spring and is also on Verzenio.  Last blood work was in August and her EGFR at that time was greater than 60 and her white blood cell count was 6300.  Past Medical History:  Diagnosis Date   Anemia    Cancer (HCC) 2024   L breast   Carpal tunnel syndrome, left    Depression    Diabetes (HCC)    Dysrhythmia    Palpitations   GERD (gastroesophageal reflux disease)    Hypertension    Iron deficiency    Panic attacks    PCOS (polycystic ovarian syndrome)    Pneumonia    Tachycardia    Umbilical hernia     Patient Active Problem List   Diagnosis Date Noted   Breast cancer (HCC) 11/20/2022   Port-A-Cath in place 07/31/2022   Genetic testing 06/15/2022   Anxiety and depression 06/11/2022   Malignant neoplasm of overlapping sites of left breast in female, estrogen receptor positive (HCC) 06/04/2022   Syncope and collapse 03/12/2022   Umbilical hernia without obstruction and without gangrene 03/12/2022   Vitamin D deficiency 03/12/2022    Cervical radicular pain 12/07/2021   Snoring 12/07/2021   Other fatigue 12/07/2021   Chest pain 08/15/2021   Epigastric pain 08/15/2021   Acute pain of both shoulders 04/21/2021   Nerve pain 04/21/2021   Morbid obesity (HCC) 10/13/2020   Essential hypertension 03/12/2019   Chronic tachycardia 03/12/2019   Generalized anxiety disorder with panic attacks 03/12/2019   Family history of heart attack 03/12/2019   LVH (left ventricular hypertrophy) due to hypertensive disease, without heart failure 03/12/2019   Type 2 diabetes mellitus (HCC) 03/12/2019    Past Surgical History:  Procedure Laterality Date   ADENOIDECTOMY     As a child   AXILLARY LYMPH NODE DISSECTION Left 11/20/2022   Procedure: LEFT AXILLARY DISSECTION;  Surgeon: Emelia Loron, MD;  Location[facility] OR;  Service: General;  Laterality: Left;   BREAST BIOPSY Left 05/29/2022   [place] LT BREAST BX W LOC DEV 1ST LESION IMG BX SPEC US GUIDE 05/29/2022 [facility]   BREAST BIOPSY Left 05/29/2022   [place] LT BREAST BX W LOC DEV EA ADD LESION IMG BX SPEC US GUIDE 05/29/2022 [facility]   BREAST BIOPSY Left 06/06/2022   MM LT BREAST BX W LOC DEV 1ST LESION IMAGE BX SPEC STEREO GUIDE 06/06/2022 [facility]  BREAST BIOPSY  11/19/2022   MM LT RADIOACTIVE SEED LOC MAMMO GUIDE 11/19/2022 [facility]   BREAST BIOPSY  11/19/2022   MM LT RADIOACTIVE SEED EA ADD LESION LOC MAMMO GUIDE 11/19/2022 [facility]   BREAST BIOPSY  11/19/2022   MM LT RADIOACTIVE SEED EA ADD LESION LOC MAMMO GUIDE 11/19/2022 [facility]   BREAST LUMPECTOMY WITH RADIOACTIVE SEED AND AXILLARY LYMPH NODE DISSECTION Left 11/20/2022   Procedure: LEFT BREAST LUMPECTOMY WITH RADIOACTIVE SEED;  Surgeon: Emelia Loron, MD;  Location[facility] OR;  Service: General;  Laterality: Left;  PEC BLOCK   CESAREAN SECTION  2015   PORT-A-CATH REMOVAL Right 11/20/2022   Procedure: REMOVAL PORT-A-CATH;  Surgeon: Emelia Loron, MD;  Location: [facility] OR;   Service: General;  Laterality: Right;   PORTACATH PLACEMENT N/A 06/19/2022   Procedure: INSERTION PORT-A-CATH;  Surgeon: Emelia Loron, MD;  Location: [facility] OR;  Service: General;  Laterality: N/A;   TONSILLECTOMY     As a child    OB History   No obstetric history on file.      Home Medications    Prior to Admission medications   Medication Sig Start Date End Date Taking? Authorizing Provider  ALPRAZolam (XANAX) 0.5 MG tablet TAKE 1 TABLET BY MOUTH AT BEDTIME AS NEEDED FOR ANXIETY 02/19/23  Yes Serena Croissant, MD  aspirin EC 325 MG tablet Take 325 mg by mouth in the morning.   Yes [provider]  benzonatate (TESSALON) 100 MG capsule Take 1 capsule (100 mg total) by mouth 3 (three) times daily as needed for cough. 03/11/23  Yes Zenia Resides, MD  carvedilol (COREG) 25 MG tablet TAKE 1 TABLET(25 MG) BY MOUTH TWICE DAILY WITH A MEAL 02/25/23  Yes McElwee, Lauren A, NP  famotidine (PEPCID) 20 MG tablet TAKE 1 TABLET(20 MG) BY MOUTH TWICE DAILY 08/16/21  Yes Tower, Audrie Gallus, MD  methylPREDNISolone (MEDROL DOSEPAK) 4 MG TBPK tablet Take as per package instructions 03/11/23  Yes Zenia Resides, MD  promethazine-dextromethorphan (PROMETHAZINE-DM) 6.25-15 MG/5ML syrup Take 5 mLs by mouth 4 (four) times daily as needed for cough. 02/18/23  Yes McElwee, Lauren A, NP  Semaglutide,0.25 or 0.5MG /DOS, (OZEMPIC, 0.25 OR 0.5 MG/DOSE,) 2 MG/3ML SOPN Inject 0.5 mg into the skin once a week. 11/22/22  Yes McElwee, Lauren A, NP  abemaciclib (VERZENIO) 100 MG tablet Take 1 tablet (100 mg total) by mouth 2 (two) times daily. 04/09/23   Serena Croissant, MD  anastrozole (ARIMIDEX) 1 MG tablet Take 1 tablet (1 mg total) by mouth daily. 04/09/23   Serena Croissant, MD  aspirin-acetaminophen-caffeine (EXCEDRIN MIGRAINE) [phone] MG tablet Take 1-2 tablets by mouth 2 (two) times daily as needed for headache or migraine.    [provider]  Blood Glucose Monitoring Suppl (ACCU-CHEK AVIVA PLUS)  w/Device KIT 1 each by Does not apply route daily at 6 (six) AM. Patient not taking: Reported on 02/18/2023 05/07/22   McElwee, Jake Church, NP  cyclobenzaprine (FLEXERIL) 10 MG tablet Take 1 tablet (10 mg total) by mouth 3 (three) times daily as needed for muscle spasms. Patient not taking: Reported on 02/18/2023 03/12/22   Gerre Scull, NP  fluconazole (DIFLUCAN) 150 MG tablet Take 1 tablet after finishing antibiotics and second tablet 3 days later if needed 02/18/23   McElwee, Lauren A, NP  glucose blood (ACCU-CHEK AVIVA PLUS) test strip Use as instructed Patient not taking: Reported on 02/18/2023 05/07/22   McElwee, Lauren A, NP  lidocaine (XYLOCAINE) 2 % jelly Apply 1  Application topically as needed. Patient not taking: Reported on 11/09/2022 08/08/22   Shanon Ace, PA-C  magic mouthwash (lidocaine, diphenhydrAMINE, alum & mag hydroxide) suspension Take 5 mLs by mouth 3 (three) times daily as needed for mouth pain. Patient not taking: Reported on 11/09/2022 07/31/22   Loa Socks, NP  methocarbamol (ROBAXIN) 750 MG tablet Take 1 tablet (750 mg total) by mouth 4 (four) times daily as needed (use for muscle cramps/pain). Patient not taking: Reported on 02/18/2023 11/21/22   Emelia Loron, MD  ONE TOUCH CLUB LANCETS MISC 1 each by Does not apply route daily. Patient not taking: Reported on 02/18/2023 12/22/21   Gerre Scull, NP  traMADol (ULTRAM) 50 MG tablet Take 1 tablet (50 mg total) by mouth every 6 (six) hours as needed (pain). 03/11/23   Zenia Resides, MD    Family History Family History  Problem Relation Age of Onset   Heart disease Mother    Diabetes Mother    Hypertension Mother    Kidney disease Mother    Heart failure Mother    Heart attack Mother 75   Arthritis Mother    Asthma Mother    COPD Mother    Depression Mother    Hyperlipidemia Mother    Mental illness Mother    Bladder Cancer Mother    Heart disease Father    Heart attack Father  23   COPD Father    Hyperlipidemia Father    Hypertension Father    Mental illness Father    Arthritis Sister    Depression Sister    Diabetes Sister    Hypertension Sister    Mental illness Sister    Miscarriages / Stillbirths Sister    Alcohol abuse Sister    Depression Sister    Diabetes Sister    Heart disease Sister    Heart attack Sister 17   Arthritis Sister    Kidney disease Sister    Transient ischemic attack Sister    Bladder Cancer Maternal Aunt    Colon cancer Maternal Aunt 22 - 47   Other Maternal Aunt 50 - 59       brain tumor (unknown if benign or malignant)   Lung cancer Paternal Aunt 54   Melanoma Paternal Aunt 27 - 49   Heart failure Maternal Grandmother    Arthritis Maternal Grandmother    Alcohol abuse Maternal Grandmother    COPD Maternal Grandmother    Diabetes Maternal Grandmother    Hearing loss Maternal Grandmother    Heart disease Maternal Grandmother    Hyperlipidemia Maternal Grandmother    Hypertension Maternal Grandmother    Stroke Maternal Grandmother    Miscarriages / Stillbirths Maternal Grandmother    Mental illness Maternal Grandmother    Heart attack Maternal Grandmother    Heart failure Maternal Grandfather    Alcohol abuse Maternal Grandfather    Arthritis Maternal Grandfather    Early death Maternal Grandfather    Heart disease Maternal Grandfather    Hyperlipidemia Maternal Grandfather    Hypertension Maternal Grandfather    Heart attack Maternal Grandfather 60   Mental illness Maternal Grandfather    Heart failure Paternal Grandmother    Arthritis Paternal Grandmother    Diabetes Paternal Grandmother    Heart attack Paternal Grandmother    Miscarriages / Stillbirths Paternal Grandmother    Mental illness Paternal Grandmother    Kidney disease Paternal Grandmother    Intellectual disability Paternal Grandmother    Hypertension Paternal Grandmother  Hyperlipidemia Paternal Grandmother    Heart disease Paternal  Grandmother    Heart failure Paternal Grandfather    Arthritis Paternal Grandfather    Heart attack Paternal Grandfather    Mental illness Paternal Grandfather    Hearing loss Paternal Grandfather    Heart disease Paternal Grandfather    Hyperlipidemia Paternal Grandfather    Hypertension Paternal Grandfather     Social History Social History   Tobacco Use   Smoking status: Former    Current packs/day: 0.00    Average packs/day: 1 pack/day for 7.0 years (7.0 ttl pk-yrs)    Types: Cigarettes, E-cigarettes    Start date: 03/20/2007    Quit date: 10/29/2021    Years since quitting: 1.3   Smokeless tobacco: Never  Vaping Use   Vaping status: Former   Quit date: 04/03/2019  Substance Use Topics   Alcohol use: Not Currently    Comment: rarely   Drug use: Yes    Types: Marijuana     Allergies   Macrobid [nitrofurantoin macrocrystal] and Ibuprofen   Review of Systems Review of Systems  Respiratory:  Positive for cough.      Physical Exam Triage Vital Signs ED Triage Vitals  Encounter Vitals Group     BP 03/11/23 0947 105/70     Systolic BP Percentile --      Diastolic BP Percentile --      Pulse Rate 03/11/23 0947 95     Resp 03/11/23 0947 20     Temp 03/11/23 0947 97.6 F (36.4 C)     Temp src --      SpO2 03/11/23 0947 92 %     Weight --      Height --      Head Circumference --      Peak Flow --      Pain Score 03/11/23 0941 8     Pain Loc --      Pain Education --      Exclude from Growth Chart --    No data found.  Updated Vital Signs BP 105/70   Pulse 95   Temp 97.6 F (36.4 C)   Resp 20   LMP 03/10/2023   SpO2 92%   Visual Acuity Right Eye Distance:   Left Eye Distance:   Bilateral Distance:    Right Eye Near:   Left Eye Near:    Bilateral Near:     Physical Exam Vitals reviewed.  Constitutional:      General: She is not in acute distress.    Appearance: She is not ill-appearing, toxic-appearing or diaphoretic.  HENT:     Nose:  Nose normal.     Mouth/Throat:     Mouth: Mucous membranes are moist.     Comments: There is some mild erythema of both tonsils.  No asymmetry Eyes:     Extraocular Movements: Extraocular movements intact.     Conjunctiva/sclera: Conjunctivae normal.     Pupils: Pupils are equal, round, and reactive to light.  Cardiovascular:     Rate and Rhythm: Normal rate and regular rhythm.     Heart sounds: No murmur heard. Pulmonary:     Effort: No respiratory distress.     Breath sounds: No stridor. No wheezing, rhonchi or rales.  Musculoskeletal:     Cervical back: Neck supple.  Lymphadenopathy:     Cervical: No cervical adenopathy.  Skin:    Capillary Refill: Capillary refill takes less than 2 seconds.     Coloration:  Skin is not jaundiced or pale.  Neurological:     General: No focal deficit present.     Mental Status: She is alert and oriented to person, place, and time.  Psychiatric:        Behavior: Behavior normal.      UC Treatments / Results  Labs (all labs ordered are listed, but only abnormal results are displayed) Labs Reviewed - No data to display  EKG   Radiology DG Chest 2 View  Result Date: 03/11/2023 CLINICAL DATA:  Dry cough for 10 days, now with pleuritic right-sided chest pain for 2-3 days. EXAM: CHEST - 2 VIEW COMPARISON:  Chest CT 06/26/2022 FINDINGS: Interstitial prominence, possibly bronchitic airway thickening. No collapse or consolidation. No edema or effusion. Normal heart size and mediastinal contours. Left axillary clips. IMPRESSION: Equivocal from bronchitic airway thickening.  No pneumonia. Electronically Signed   By: Tiburcio Pea M.D.   On: 03/11/2023 10:34    Procedures Procedures (including critical care time)  Medications Ordered in UC Medications - No data to display  Initial Impression / Assessment and Plan / UC Course  I have reviewed the triage vital signs and the nursing notes.  Pertinent labs & imaging results that were available  during my care of the patient were reviewed by me and considered in my medical decision making (see chart for details).     Chest x-ray is negative for infiltrate or fluid.  Medrol Dosepak is sent in for possible pleuritis and Tessalon Perles are sent in for the cough. Final Clinical Impressions(s) / UC Diagnoses   Final diagnoses:  Acute cough  Pleurisy     Discharge Instructions      There is no pneumonia or fluid on your chest x-ray.  Take the Medrol Dosepak as instructed in the package.  Take benzonatate 100 mg, 1 tab every 8 hours as needed for cough.  Take tramadol 50 mg-- 1 tablet every 6 hours as needed for pain.  This medication can make you sleepy or dizzy   .     ED Prescriptions     Medication Sig Dispense Auth. Provider   traMADol (ULTRAM) 50 MG tablet Take 1 tablet (50 mg total) by mouth every 6 (six) hours as needed (pain). 12 tablet Joniah Bednarski, Janace Aris, MD   methylPREDNISolone (MEDROL DOSEPAK) 4 MG TBPK tablet Take as per package instructions 1 each Zenia Resides, MD   benzonatate (TESSALON) 100 MG capsule Take 1 capsule (100 mg total) by mouth 3 (three) times daily as needed for cough. 21 capsule Zenia Resides, MD      I have reviewed the PDMP during this encounter.   Zenia Resides, MD 03/11/23 1044

## 2023-03-11 NOTE — Discharge Instructions (Addendum)
There is no pneumonia or fluid on your chest x-ray.  Take the Medrol Dosepak as instructed in the package.  Take benzonatate 100 mg, 1 tab every 8 hours as needed for cough.  Take tramadol 50 mg-- 1 tablet every 6 hours as needed for pain.  This medication can make you sleepy or dizzy   .

## 2023-03-11 NOTE — ED Triage Notes (Signed)
Pt reports for 3 days she has had cough and sharp pain when coughing in RT chest. Pt reports having a sore throat only at night. Pt continues to cough. Pt currently receiving radiation for Lt breast Cancer .

## 2023-03-12 ENCOUNTER — Ambulatory Visit: Payer: BC Managed Care – PPO

## 2023-03-13 ENCOUNTER — Ambulatory Visit: Payer: BC Managed Care – PPO

## 2023-03-14 ENCOUNTER — Ambulatory Visit: Payer: BC Managed Care – PPO

## 2023-03-14 ENCOUNTER — Ambulatory Visit
Admission: RE | Admit: 2023-03-14 | Discharge: 2023-03-14 | Disposition: A | Discharge status: Home / Self Care | Payer: Medicaid Other | Source: Ambulatory Visit | Attending: Radiation Oncology | Admitting: Radiation Oncology

## 2023-03-14 ENCOUNTER — Other Ambulatory Visit: Payer: Self-pay

## 2023-03-14 DIAGNOSIS — Z170 Estrogen receptor positive status [ER+]: Secondary | ICD-10-CM | POA: Diagnosis not present

## 2023-03-14 DIAGNOSIS — Z510 Encounter for antineoplastic radiation therapy: Secondary | ICD-10-CM | POA: Diagnosis not present

## 2023-03-14 DIAGNOSIS — C50812 Malignant neoplasm of overlapping sites of left female breast: Secondary | ICD-10-CM | POA: Diagnosis not present

## 2023-03-14 LAB — RAD ONC ARIA SESSION SUMMARY
Course Elapsed Days: 52
Plan Fractions Treated to Date: 26
Plan Fractions Treated to Date: 26
Plan Prescribed Dose Per Fraction: 1.8 Gy
Plan Prescribed Dose Per Fraction: 1.8 Gy
Plan Total Fractions Prescribed: 28
Plan Total Fractions Prescribed: 28
Plan Total Prescribed Dose: 50.4 Gy
Plan Total Prescribed Dose: 50.4 Gy
Reference Point Dosage Given to Date: 46.8 Gy
Reference Point Dosage Given to Date: 46.8 Gy
Reference Point Session Dosage Given: 1.8 Gy
Reference Point Session Dosage Given: 1.8 Gy
Session Number: 26

## 2023-03-15 ENCOUNTER — Telehealth: Payer: Self-pay | Admitting: Pharmacist

## 2023-03-15 ENCOUNTER — Ambulatory Visit
Admission: RE | Admit: 2023-03-15 | Discharge: 2023-03-15 | Disposition: A | Discharge status: Home / Self Care | Payer: Medicaid Other | Source: Ambulatory Visit | Attending: Radiation Oncology

## 2023-03-15 ENCOUNTER — Ambulatory Visit: Payer: BC Managed Care – PPO

## 2023-03-15 ENCOUNTER — Other Ambulatory Visit: Payer: Self-pay

## 2023-03-15 DIAGNOSIS — C50812 Malignant neoplasm of overlapping sites of left female breast: Secondary | ICD-10-CM | POA: Diagnosis not present

## 2023-03-15 DIAGNOSIS — Z170 Estrogen receptor positive status [ER+]: Secondary | ICD-10-CM | POA: Diagnosis not present

## 2023-03-15 DIAGNOSIS — Z510 Encounter for antineoplastic radiation therapy: Secondary | ICD-10-CM | POA: Diagnosis not present

## 2023-03-15 LAB — RAD ONC ARIA SESSION SUMMARY
Course Elapsed Days: 53
Plan Fractions Treated to Date: 27
Plan Fractions Treated to Date: 27
Plan Prescribed Dose Per Fraction: 1.8 Gy
Plan Prescribed Dose Per Fraction: 1.8 Gy
Plan Total Fractions Prescribed: 28
Plan Total Fractions Prescribed: 28
Plan Total Prescribed Dose: 50.4 Gy
Plan Total Prescribed Dose: 50.4 Gy
Reference Point Dosage Given to Date: 48.6 Gy
Reference Point Dosage Given to Date: 48.6 Gy
Reference Point Session Dosage Given: 1.8 Gy
Reference Point Session Dosage Given: 1.8 Gy
Session Number: 27

## 2023-03-15 NOTE — Telephone Encounter (Signed)
Left patient a message in regards to rescheduled appointment times/dates; left patient callback number for scheduling concerns

## 2023-03-18 ENCOUNTER — Ambulatory Visit: Payer: BC Managed Care – PPO

## 2023-03-18 ENCOUNTER — Other Ambulatory Visit: Payer: Self-pay

## 2023-03-18 ENCOUNTER — Encounter: Payer: Self-pay | Admitting: *Deleted

## 2023-03-18 ENCOUNTER — Inpatient Hospital Stay: Payer: BC Managed Care – PPO | Admitting: Pharmacist

## 2023-03-18 ENCOUNTER — Ambulatory Visit
Admission: RE | Admit: 2023-03-18 | Discharge: 2023-03-18 | Disposition: A | Discharge status: Home / Self Care | Payer: Medicaid Other | Source: Ambulatory Visit | Attending: Radiation Oncology | Admitting: Radiation Oncology

## 2023-03-18 DIAGNOSIS — Z510 Encounter for antineoplastic radiation therapy: Secondary | ICD-10-CM | POA: Diagnosis not present

## 2023-03-18 DIAGNOSIS — C50812 Malignant neoplasm of overlapping sites of left female breast: Secondary | ICD-10-CM | POA: Diagnosis not present

## 2023-03-18 DIAGNOSIS — Z170 Estrogen receptor positive status [ER+]: Secondary | ICD-10-CM | POA: Diagnosis not present

## 2023-03-18 LAB — RAD ONC ARIA SESSION SUMMARY
Course Elapsed Days: 56
Plan Fractions Treated to Date: 28
Plan Fractions Treated to Date: 28
Plan Prescribed Dose Per Fraction: 1.8 Gy
Plan Prescribed Dose Per Fraction: 1.8 Gy
Plan Total Fractions Prescribed: 28
Plan Total Fractions Prescribed: 28
Plan Total Prescribed Dose: 50.4 Gy
Plan Total Prescribed Dose: 50.4 Gy
Reference Point Dosage Given to Date: 50.4 Gy
Reference Point Dosage Given to Date: 50.4 Gy
Reference Point Session Dosage Given: 1.8 Gy
Reference Point Session Dosage Given: 1.8 Gy
Session Number: 28

## 2023-03-18 NOTE — Progress Notes (Signed)
Received message from PT team stating pt will not qualify for custom sleeve for lymphedema prevention because pt does not currently have active lymphedema.  Due to pt weight and amount of lymph nodes removed, pt at high risk for lymphedema in the future.  Pt educated to contact our office if she notices any swelling in extremity for evaluation of lymphedema.

## 2023-03-19 ENCOUNTER — Other Ambulatory Visit: Payer: Self-pay

## 2023-03-19 ENCOUNTER — Ambulatory Visit
Admission: RE | Admit: 2023-03-19 | Discharge: 2023-03-19 | Disposition: A | Discharge status: Home / Self Care | Payer: Medicaid Other | Source: Ambulatory Visit | Attending: Radiation Oncology | Admitting: Radiation Oncology

## 2023-03-19 ENCOUNTER — Other Ambulatory Visit (HOSPITAL_COMMUNITY): Payer: Self-pay

## 2023-03-19 ENCOUNTER — Ambulatory Visit: Payer: BC Managed Care – PPO

## 2023-03-19 ENCOUNTER — Other Ambulatory Visit: Payer: Self-pay | Admitting: Pharmacy Technician

## 2023-03-19 DIAGNOSIS — Z510 Encounter for antineoplastic radiation therapy: Secondary | ICD-10-CM | POA: Diagnosis not present

## 2023-03-19 DIAGNOSIS — C50812 Malignant neoplasm of overlapping sites of left female breast: Secondary | ICD-10-CM | POA: Diagnosis not present

## 2023-03-19 DIAGNOSIS — Z170 Estrogen receptor positive status [ER+]: Secondary | ICD-10-CM | POA: Diagnosis not present

## 2023-03-19 LAB — RAD ONC ARIA SESSION SUMMARY
Course Elapsed Days: 57
Plan Fractions Treated to Date: 1
Plan Prescribed Dose Per Fraction: 2 Gy
Plan Total Fractions Prescribed: 5
Plan Total Prescribed Dose: 10 Gy
Reference Point Dosage Given to Date: 2 Gy
Reference Point Session Dosage Given: 2 Gy
Session Number: 29

## 2023-03-19 NOTE — Progress Notes (Unsigned)
Specialty Pharmacy Initial Fill Coordination Note  Lynn [name] is a 37 y.o. female contacted today regarding refills of specialty medication(s) Abemaciclib (VERZENIO) .  Patient requested Daryll Drown at [facility]  on No data recorded  Medication will be filled on 03/19/23.   Patient is aware of $4 copayment.

## 2023-03-20 ENCOUNTER — Other Ambulatory Visit: Payer: Self-pay

## 2023-03-20 ENCOUNTER — Ambulatory Visit: Payer: BC Managed Care – PPO

## 2023-03-20 ENCOUNTER — Ambulatory Visit
Admission: RE | Admit: 2023-03-20 | Discharge: 2023-03-20 | Disposition: A | Discharge status: Home / Self Care | Payer: Medicaid Other | Source: Ambulatory Visit | Attending: Radiation Oncology

## 2023-03-20 ENCOUNTER — Inpatient Hospital Stay: Payer: BC Managed Care – PPO

## 2023-03-20 ENCOUNTER — Encounter: Payer: Self-pay | Admitting: Hematology and Oncology

## 2023-03-20 ENCOUNTER — Inpatient Hospital Stay: Payer: BC Managed Care – PPO | Admitting: Pharmacist

## 2023-03-20 VITALS — BP 159/78 | HR 107 | Temp 98.5°F | Resp 17 | Wt 344.0 lb

## 2023-03-20 DIAGNOSIS — Z170 Estrogen receptor positive status [ER+]: Secondary | ICD-10-CM

## 2023-03-20 DIAGNOSIS — Z1732 Human epidermal growth factor receptor 2 negative status: Secondary | ICD-10-CM | POA: Diagnosis not present

## 2023-03-20 DIAGNOSIS — Z923 Personal history of irradiation: Secondary | ICD-10-CM | POA: Diagnosis not present

## 2023-03-20 DIAGNOSIS — Z9221 Personal history of antineoplastic chemotherapy: Secondary | ICD-10-CM | POA: Diagnosis not present

## 2023-03-20 DIAGNOSIS — Z510 Encounter for antineoplastic radiation therapy: Secondary | ICD-10-CM | POA: Diagnosis not present

## 2023-03-20 DIAGNOSIS — Z1721 Progesterone receptor positive status: Secondary | ICD-10-CM | POA: Diagnosis not present

## 2023-03-20 DIAGNOSIS — C50812 Malignant neoplasm of overlapping sites of left female breast: Secondary | ICD-10-CM | POA: Diagnosis not present

## 2023-03-20 LAB — CBC WITH DIFFERENTIAL (CANCER CENTER ONLY)
Abs Immature Granulocytes: 0.03 10*3/uL (ref 0.00–0.07)
Basophils Absolute: 0.1 10*3/uL (ref 0.0–0.1)
Basophils Relative: 1 %
Eosinophils Absolute: 0.1 10*3/uL (ref 0.0–0.5)
Eosinophils Relative: 1 %
HCT: 35.7 % — ABNORMAL LOW (ref 36.0–46.0)
Hemoglobin: 12.2 g/dL (ref 12.0–15.0)
Immature Granulocytes: 0 %
Lymphocytes Relative: 9 %
Lymphs Abs: 0.6 10*3/uL — ABNORMAL LOW (ref 0.7–4.0)
MCH: 27.2 pg (ref 26.0–34.0)
MCHC: 34.2 g/dL (ref 30.0–36.0)
MCV: 79.5 fL — ABNORMAL LOW (ref 80.0–100.0)
Monocytes Absolute: 0.7 10*3/uL (ref 0.1–1.0)
Monocytes Relative: 10 %
Neutro Abs: 5.6 10*3/uL (ref 1.7–7.7)
Neutrophils Relative %: 79 %
Platelet Count: 213 10*3/uL (ref 150–400)
RBC: 4.49 MIL/uL (ref 3.87–5.11)
RDW: 14.5 % (ref 11.5–15.5)
WBC Count: 7 10*3/uL (ref 4.0–10.5)
nRBC: 0 % (ref 0.0–0.2)

## 2023-03-20 LAB — RAD ONC ARIA SESSION SUMMARY
Course Elapsed Days: 58
Plan Fractions Treated to Date: 2
Plan Prescribed Dose Per Fraction: 2 Gy
Plan Total Fractions Prescribed: 5
Plan Total Prescribed Dose: 10 Gy
Reference Point Dosage Given to Date: 4 Gy
Reference Point Session Dosage Given: 2 Gy
Session Number: 30

## 2023-03-20 LAB — CMP (CANCER CENTER ONLY)
ALT: 22 U/L (ref 0–44)
AST: 15 U/L (ref 15–41)
Albumin: 3.7 g/dL (ref 3.5–5.0)
Alkaline Phosphatase: 83 U/L (ref 38–126)
Anion gap: 5 (ref 5–15)
BUN: 11 mg/dL (ref 6–20)
CO2: 28 mmol/L (ref 22–32)
Calcium: 8.7 mg/dL — ABNORMAL LOW (ref 8.9–10.3)
Chloride: 101 mmol/L (ref 98–111)
Creatinine: 0.69 mg/dL (ref 0.44–1.00)
GFR, Estimated: >60 mL/min (ref >60)
Glucose, Bld: 248 mg/dL — ABNORMAL HIGH (ref 70–99)
Potassium: 4.4 mmol/L (ref 3.5–5.1)
Sodium: 134 mmol/L — ABNORMAL LOW (ref 135–145)
Total Bilirubin: 0.4 mg/dL (ref <1.2)
Total Protein: 6.4 g/dL — ABNORMAL LOW (ref 6.5–8.1)

## 2023-03-20 MED ORDER — ONDANSETRON HCL 8 MG PO TABS: 8.0000 mg | ORAL_TABLET | Freq: Three times a day (TID) | ORAL | 2 refills | Status: DC | PRN

## 2023-03-20 NOTE — Progress Notes (Signed)
[facility]       Telephone: [phone]?Fax: [phone]   Oncology Clinical Pharmacist Practitioner Initial Assessment  Lynn [name] is a 37 y.o. female with a diagnosis of breast cancer. They were contacted today via in-person visit.  Indication/Regimen Abemaciclib (Verzenio) is being used appropriately for treatment of breast cancer by Dr. Serena Croissant.      Wt Readings from Last 1 Encounters:  03/04/23 (!) 341 lb (154.7 kg)    Estimated body surface area is 2.7 meters squared as calculated from the following:   Height as of 03/04/23: 5\' 7"  (1.702 m).   Weight as of 03/04/23: 341 lb (154.7 kg).  The dosing regimen is 100 mg by mouth every 12 hours on days 1 to 28 of a 28-day cycle. This is being given  in combination with anastrozole starting 04/09/23 and goserelin starting 04/10/23 . It is planned to continue until two years in the adjuvant setting per the monarchE trial data. Prescription dose and frequency assessed for appropriateness.  Patient has agreed to treatment which is documented in physician note on 03/04/23. Counseled patient on administration, dosing, side effects, monitoring, drug-food interactions, safe handling, storage, and disposal.  She will likely start abemaciclib when she sees Dr. Pamelia Hoit next on 04/10/23. She is picking it up today. She just took her blood pressure medications this morning but is asymptomatic.    Dose Modifications Dr. Pamelia Hoit is starting abemaciclib at 100 mg by mouth every 12 hours.  Access Assessment Lynn [name] will be receiving abemaciclib through [facility] Concerns: none Start date if known: likely at her next visit with Dr. Pamelia Hoit on 04/10/23  Adherence Assessment Reviewed importance on keeping a med schedule and plan for any missed doses Barriers to adherence identified? No  Allergies Allergies  Allergen Reactions   Macrobid [Nitrofurantoin Macrocrystal] Shortness Of Breath and  Nausea And Vomiting   Ibuprofen Hives    Vitals    03/20/2023    9:04 AM 03/11/2023    9:47 AM 03/04/2023    3:44 PM  Oncology Vitals  Height   170 cm  Weight 156.037 kg  154.677 kg  Weight (lbs) 344 lbs  341 lbs  BMI 53.88 kg/m2  53.41 kg/m2  Temp 98.5 F (36.9 C) 97.6 F (36.4 C) 97.4 F (36.3 C)  Pulse Rate 107 95 101  BP 159/78 105/70 153/88  Resp 17 20 18   SpO2 97 % 92 % 99 %  BSA (m2) 2.72 m2  2.7 m2     Laboratory Data    Latest Ref Rng & Units 03/20/2023    8:34 AM 11/15/2022    8:22 AM 10/16/2022    8:13 AM  CBC EXTENDED  WBC 4.0 - 10.5 K/uL 7.0  6.3  6.1   RBC 3.87 - 5.11 MIL/uL 4.49  4.15  3.60   Hemoglobin 12.0 - 15.0 g/dL 87.5  64.3  32.9   HCT 36.0 - 46.0 % 35.7  35.0  31.5   Platelets 150 - 400 K/uL 213  235  243   NEUT# 1.7 - 7.7 K/uL 5.6   4.3   Lymph# 0.7 - 4.0 K/uL 0.6   0.9        Latest Ref Rng & Units 03/20/2023    8:34 AM 11/15/2022    8:22 AM 10/16/2022    8:13 AM  CMP  Glucose 70 - 99 mg/dL 518  841  660   BUN 6 - 20 mg/dL 11  12  15   Creatinine 0.44 - 1.00 mg/dL 4.09  8.11  9.14   Sodium 135 - 145 mmol/L 134  135  136   Potassium 3.5 - 5.1 mmol/L 4.4  4.1  4.3   Chloride 98 - 111 mmol/L 101  101  104   CO2 22 - 32 mmol/L 28  25  25    Calcium 8.9 - 10.3 mg/dL 8.7  8.5  8.9   Total Protein 6.5 - 8.1 g/dL 6.4   6.8   Total Bilirubin <1.2 mg/dL 0.4   0.5   Alkaline Phos 38 - 126 U/L 83   61   AST 15 - 41 U/L 15   17   ALT 0 - 44 U/L 22   22    Contraindications Contraindications were reviewed? Yes Contraindications to therapy were identified? No   Safety Precautions The following safety precautions for the use of abemaciclib were reviewed:  Changes in kidney function: importance of drinking plenty of fluids and monitoring urine output Diarrhea: we reviewed that diarrhea is common with abemaciclib and confirmed that she does have loperamide (Imodium) at home.  We reviewed how to take this medication PRN and gave her information on  abemaciclib Decreased white blood cells (WBCs) and increased risk for infection: we discussed the importance of having a thermometer and what the Centers for Disease Control and Prevention (CDC) considers a fever which is 100.35F (38C) or higher.  Gave patient 24/7 triage line to call if any fevers or symptoms Decreased hemoglobin, part of red blood cells that carry iron and oxygen Fatigue Nausea and Vomiting Hepatotoxicity: reviewed to contact clinic for RUQ pain that will not subside, yellowing of eyes/skin Decreased appetite or weight loss Abdominal pain Decreased platelet count and increased risk for bleeding Venous thromboembolism (VTE): reviewed signs of deep vein thrombosis (DVT) such as leg swelling, redness, pain, or tenderness and signs of pulmonary embolism (PE) such as shortness of breath, rapid or irregular heartbeat, cough, chest pain, or lightheadedness ILD/Pneumonitis: we reviewed potential symptoms including cough, shortness, and fatigue. Handling body fluids and waste Pregnancy, sexual activity, and contraception Avoid grapefruit products Reviewed to take the medication every 12 hours (with food sometimes can be easier on the stomach) and to take it at the same time every day. Discussed proper storage and handling of abemaciclib  Medication Reconciliation Current Outpatient Medications  Medication Sig Dispense Refill   [START ON 04/09/2023] abemaciclib (VERZENIO) 100 MG tablet Take 1 tablet (100 mg total) by mouth 2 (two) times daily. 60 tablet 3   ALPRAZolam (XANAX) 0.5 MG tablet TAKE 1 TABLET BY MOUTH AT BEDTIME AS NEEDED FOR ANXIETY 30 tablet 3   [START ON 04/09/2023] anastrozole (ARIMIDEX) 1 MG tablet Take 1 tablet (1 mg total) by mouth daily. 90 tablet 3   aspirin EC 325 MG tablet Take 325 mg by mouth in the morning.     aspirin-acetaminophen-caffeine (EXCEDRIN MIGRAINE) 250-250-65 MG tablet Take 1-2 tablets by mouth 2 (two) times daily as needed for headache or migraine.      benzonatate (TESSALON) 100 MG capsule Take 1 capsule (100 mg total) by mouth 3 (three) times daily as needed for cough. 21 capsule 0   Blood Glucose Monitoring Suppl (ACCU-CHEK AVIVA PLUS) w/Device KIT 1 each by Does not apply route daily at 6 (six) AM. (Patient not taking: Reported on 02/18/2023) 1 kit 0   carvedilol (COREG) 25 MG tablet TAKE 1 TABLET(25 MG) BY MOUTH TWICE DAILY WITH A MEAL 180  tablet 0   cyclobenzaprine (FLEXERIL) 10 MG tablet Take 1 tablet (10 mg total) by mouth 3 (three) times daily as needed for muscle spasms. (Patient not taking: Reported on 02/18/2023) 30 tablet 1   famotidine (PEPCID) 20 MG tablet TAKE 1 TABLET(20 MG) BY MOUTH TWICE DAILY 180 tablet 0   fluconazole (DIFLUCAN) 150 MG tablet Take 1 tablet after finishing antibiotics and second tablet 3 days later if needed 2 tablet 0   glucose blood (ACCU-CHEK AVIVA PLUS) test strip Use as instructed (Patient not taking: Reported on 02/18/2023) 100 each 12   lidocaine (XYLOCAINE) 2 % jelly Apply 1 Application topically as needed. (Patient not taking: Reported on 11/09/2022) 85 g 0   magic mouthwash (lidocaine, diphenhydrAMINE, alum & mag hydroxide) suspension Take 5 mLs by mouth 3 (three) times daily as needed for mouth pain. (Patient not taking: Reported on 11/09/2022) 120 mL 0   methocarbamol (ROBAXIN) 750 MG tablet Take 1 tablet (750 mg total) by mouth 4 (four) times daily as needed (use for muscle cramps/pain). (Patient not taking: Reported on 02/18/2023) 30 tablet 0   methylPREDNISolone (MEDROL DOSEPAK) 4 MG TBPK tablet Take as per package instructions 1 each 0   ONE TOUCH CLUB LANCETS MISC 1 each by Does not apply route daily. (Patient not taking: Reported on 02/18/2023) 100 each 11   OZEMPIC, 0.25 OR 0.5 MG/DOSE, 2 MG/3ML SOPN INJECT 0.5 INTO THE SKIN ONCE A WEEK 3 mL 2   promethazine-dextromethorphan (PROMETHAZINE-DM) 6.25-15 MG/5ML syrup Take 5 mLs by mouth 4 (four) times daily as needed for cough. 118 mL 0   traMADol  (ULTRAM) 50 MG tablet Take 1 tablet (50 mg total) by mouth every 6 (six) hours as needed (pain). 12 tablet 0   No current facility-administered medications for this visit.    Medication reconciliation is based on the patient's most recent medication list in the electronic medical record (EMR) including herbal products and OTC medications.   The patient's medication list was reviewed today with the patient? Yes   Drug-drug interactions (DDIs) DDIs were evaluated? Yes Significant DDIs identified? No   Drug-Food Interactions Drug-food interactions were evaluated? Yes Drug-food interactions identified?  Avoid grapefruit products  Follow-up Plan  Patient education handout given to patient Start abemaciclib after visiting with Dr. Pamelia Hoit on 04/10/23 Start anastrozole 1 mg by mouth daily on 04/09/23 Start goserelin 3.6 mg SubQ every 28 days on 04/10/23 Monitor lymphopenia, sodium, and calcium. Dr. Pamelia Hoit will likely add labs, follow up visit with clinical pharmacy two weeks after starting abemaciclib.  Lynn [name] participated in the discussion, expressed understanding, and voiced agreement with the above plan. All questions were answered to her satisfaction. The patient was advised to contact the clinic at [phone] with any questions or concerns prior to her return visit.   I spent 60 minutes assessing the patient.  Lynn [name] A. Odetta Pink, PharmD, BCOP, CPP  Anselm Lis, RPH-CPP, 03/20/2023 8:36 AM  **Disclaimer: This note was dictated with voice recognition software. Similar sounding words can inadvertently be transcribed and this note may contain transcription errors which may not have been corrected upon publication of note.**

## 2023-03-20 NOTE — Progress Notes (Signed)
Patient counseled in clinic visit note on 03/20/23

## 2023-03-21 ENCOUNTER — Encounter: Payer: Self-pay | Admitting: Hematology and Oncology

## 2023-03-21 ENCOUNTER — Ambulatory Visit
Admission: RE | Admit: 2023-03-21 | Discharge: 2023-03-21 | Disposition: A | Discharge status: Home / Self Care | Payer: BC Managed Care – PPO | Source: Ambulatory Visit | Attending: Radiation Oncology | Admitting: Radiation Oncology

## 2023-03-21 ENCOUNTER — Ambulatory Visit: Payer: BC Managed Care – PPO

## 2023-03-21 ENCOUNTER — Other Ambulatory Visit: Payer: Self-pay

## 2023-03-21 DIAGNOSIS — C50812 Malignant neoplasm of overlapping sites of left female breast: Secondary | ICD-10-CM | POA: Diagnosis not present

## 2023-03-21 DIAGNOSIS — Z510 Encounter for antineoplastic radiation therapy: Secondary | ICD-10-CM | POA: Diagnosis not present

## 2023-03-21 DIAGNOSIS — Z170 Estrogen receptor positive status [ER+]: Secondary | ICD-10-CM | POA: Diagnosis not present

## 2023-03-21 LAB — RAD ONC ARIA SESSION SUMMARY
Course Elapsed Days: 59
Plan Fractions Treated to Date: 3
Plan Prescribed Dose Per Fraction: 2 Gy
Plan Total Fractions Prescribed: 5
Plan Total Prescribed Dose: 10 Gy
Reference Point Dosage Given to Date: 6 Gy
Reference Point Session Dosage Given: 2 Gy
Session Number: 31

## 2023-03-22 ENCOUNTER — Ambulatory Visit: Payer: BC Managed Care – PPO

## 2023-03-22 ENCOUNTER — Ambulatory Visit
Admission: RE | Admit: 2023-03-22 | Discharge: 2023-03-22 | Disposition: A | Discharge status: Home / Self Care | Payer: BC Managed Care – PPO | Source: Ambulatory Visit | Attending: Radiation Oncology | Admitting: Radiation Oncology

## 2023-03-22 ENCOUNTER — Other Ambulatory Visit: Payer: Self-pay

## 2023-03-22 DIAGNOSIS — C50812 Malignant neoplasm of overlapping sites of left female breast: Secondary | ICD-10-CM | POA: Diagnosis not present

## 2023-03-22 DIAGNOSIS — Z510 Encounter for antineoplastic radiation therapy: Secondary | ICD-10-CM | POA: Diagnosis not present

## 2023-03-22 DIAGNOSIS — Z170 Estrogen receptor positive status [ER+]: Secondary | ICD-10-CM | POA: Diagnosis not present

## 2023-03-22 LAB — RAD ONC ARIA SESSION SUMMARY
Course Elapsed Days: 60
Plan Fractions Treated to Date: 4
Plan Prescribed Dose Per Fraction: 2 Gy
Plan Total Fractions Prescribed: 5
Plan Total Prescribed Dose: 10 Gy
Reference Point Dosage Given to Date: 8 Gy
Reference Point Session Dosage Given: 2 Gy
Session Number: 32

## 2023-03-25 ENCOUNTER — Other Ambulatory Visit: Payer: Self-pay

## 2023-03-25 ENCOUNTER — Ambulatory Visit
Admission: RE | Admit: 2023-03-25 | Discharge: 2023-03-25 | Disposition: A | Discharge status: Home / Self Care | Payer: BC Managed Care – PPO | Source: Ambulatory Visit | Attending: Radiation Oncology | Admitting: Radiation Oncology

## 2023-03-25 DIAGNOSIS — Z510 Encounter for antineoplastic radiation therapy: Secondary | ICD-10-CM | POA: Diagnosis not present

## 2023-03-25 DIAGNOSIS — C50812 Malignant neoplasm of overlapping sites of left female breast: Secondary | ICD-10-CM | POA: Diagnosis not present

## 2023-03-25 DIAGNOSIS — Z170 Estrogen receptor positive status [ER+]: Secondary | ICD-10-CM | POA: Diagnosis not present

## 2023-03-25 LAB — RAD ONC ARIA SESSION SUMMARY
Course Elapsed Days: 63
Plan Fractions Treated to Date: 5
Plan Prescribed Dose Per Fraction: 2 Gy
Plan Total Fractions Prescribed: 5
Plan Total Prescribed Dose: 10 Gy
Reference Point Dosage Given to Date: 10 Gy
Reference Point Session Dosage Given: 2 Gy
Session Number: 33

## 2023-03-26 NOTE — Radiation Completion Notes (Signed)
Patient Name: Lynn [name], Lynn [name] MRN: [id number] Date of Birth: November 29, 1985 Referring Physician: Rodman Pickle, M.D. Date of Service: 2023-03-26 Radiation Oncologist: Dorothy Puffer, M.D. [facility]                             RADIATION ONCOLOGY END OF TREATMENT NOTE     Diagnosis: C50.812 Malignant neoplasm of overlapping sites of left female breast Staging on 2022-06-15: Malignant neoplasm of overlapping sites of left breast in female, estrogen receptor positive (HCC) T=cT3, N=cN3, M=cM0 Intent: Curative     ==========DELIVERED PLANS==========  First Treatment Date: 2023-01-21 Last Treatment Date: 2023-03-25   Plan Name: Breast_L_BH Site: Breast, Left Technique: 3D Mode: Photon Dose Per Fraction: 1.8 Gy Prescribed Dose (Delivered / Prescribed): 50.4 Gy / 50.4 Gy Prescribed Fxs (Delivered / Prescribed): 28 / 28   Plan Name: Brst_L_SCV_BH Site: Sclav-LT Technique: 3D Mode: Photon Dose Per Fraction: 1.8 Gy Prescribed Dose (Delivered / Prescribed): 50.4 Gy / 50.4 Gy Prescribed Fxs (Delivered / Prescribed): 28 / 28   Plan Name: Brst_L_BH_Bst Site: Breast, Left Technique: 3D Mode: Photon Dose Per Fraction: 2 Gy Prescribed Dose (Delivered / Prescribed): 10 Gy / 10 Gy Prescribed Fxs (Delivered / Prescribed): 5 / 5     ==========ON TREATMENT VISIT DATES========== 2023-01-25, 2023-02-01, 2023-02-22, 2023-03-04, 2023-03-08, 2023-03-15, 2023-03-22     ==========UPCOMING VISITS==========       ==========APPENDIX - ON TREATMENT VISIT NOTES==========   See weekly On Treatment Notes in Epic for details in the Media tab (listed as Progress notes on the On Treatment Visit Dates listed above).

## 2023-03-29 ENCOUNTER — Other Ambulatory Visit (HOSPITAL_COMMUNITY): Payer: Self-pay

## 2023-03-29 ENCOUNTER — Other Ambulatory Visit: Payer: Self-pay

## 2023-03-29 ENCOUNTER — Telehealth: Payer: Self-pay | Admitting: Pharmacy Technician

## 2023-03-29 ENCOUNTER — Encounter: Payer: Self-pay | Admitting: Hematology and Oncology

## 2023-03-29 NOTE — Telephone Encounter (Signed)
Oral Oncology Patient Advocate Encounter   Received notification that prior authorization for Verzenio is required.   PA submitted on 03/29/23 Key [id number] Status is pending     Jinger Neighbors, CPhT-Adv Oncology Pharmacy Patient Advocate [facility] Direct Number: [phone]  Fax: [phone]

## 2023-03-29 NOTE — Telephone Encounter (Signed)
Oral Oncology Patient Advocate Encounter  Prior Authorization for Lynn [name] has been approved.    PA# [id number] Effective dates: 03/29/23 through 03/28/24  Patients co-pay is $4 after applying [place] Medicaid secondary.  Patient will need to fill 14 day supply for first 2 months   Jinger Neighbors, CPhT-Adv Oncology Pharmacy Patient Advocate [facility] Direct Number: [phone]  Fax: [phone]

## 2023-04-02 DIAGNOSIS — C50812 Malignant neoplasm of overlapping sites of left female breast: Secondary | ICD-10-CM | POA: Diagnosis not present

## 2023-04-02 DIAGNOSIS — Z170 Estrogen receptor positive status [ER+]: Secondary | ICD-10-CM | POA: Diagnosis not present

## 2023-04-04 ENCOUNTER — Encounter: Payer: Self-pay | Admitting: Hematology and Oncology

## 2023-04-10 ENCOUNTER — Inpatient Hospital Stay (HOSPITAL_BASED_OUTPATIENT_CLINIC_OR_DEPARTMENT_OTHER): Payer: BC Managed Care – PPO | Admitting: Hematology and Oncology

## 2023-04-10 ENCOUNTER — Inpatient Hospital Stay: Payer: BC Managed Care – PPO

## 2023-04-10 ENCOUNTER — Inpatient Hospital Stay: Payer: Medicaid Other | Attending: Hematology and Oncology

## 2023-04-10 VITALS — BP 140/77 | HR 104 | Temp 97.7°F | Resp 18 | Ht 67.0 in | Wt 344.0 lb

## 2023-04-10 DIAGNOSIS — E119 Type 2 diabetes mellitus without complications: Secondary | ICD-10-CM | POA: Diagnosis not present

## 2023-04-10 DIAGNOSIS — Z7982 Long term (current) use of aspirin: Secondary | ICD-10-CM | POA: Diagnosis not present

## 2023-04-10 DIAGNOSIS — Z7985 Long-term (current) use of injectable non-insulin antidiabetic drugs: Secondary | ICD-10-CM | POA: Diagnosis not present

## 2023-04-10 DIAGNOSIS — C50812 Malignant neoplasm of overlapping sites of left female breast: Secondary | ICD-10-CM

## 2023-04-10 DIAGNOSIS — Z79811 Long term (current) use of aromatase inhibitors: Secondary | ICD-10-CM | POA: Insufficient documentation

## 2023-04-10 DIAGNOSIS — Z79899 Other long term (current) drug therapy: Secondary | ICD-10-CM | POA: Insufficient documentation

## 2023-04-10 DIAGNOSIS — Z5111 Encounter for antineoplastic chemotherapy: Secondary | ICD-10-CM | POA: Diagnosis not present

## 2023-04-10 DIAGNOSIS — Z923 Personal history of irradiation: Secondary | ICD-10-CM | POA: Diagnosis not present

## 2023-04-10 DIAGNOSIS — Z170 Estrogen receptor positive status [ER+]: Secondary | ICD-10-CM | POA: Diagnosis not present

## 2023-04-10 LAB — CMP (CANCER CENTER ONLY)
ALT: 22 U/L (ref 0–44)
AST: 17 U/L (ref 15–41)
Albumin: 3.7 g/dL (ref 3.5–5.0)
Alkaline Phosphatase: 66 U/L (ref 38–126)
Anion gap: 7 (ref 5–15)
BUN: 15 mg/dL (ref 6–20)
CO2: 25 mmol/L (ref 22–32)
Calcium: 8.6 mg/dL — ABNORMAL LOW (ref 8.9–10.3)
Chloride: 102 mmol/L (ref 98–111)
Creatinine: 0.65 mg/dL (ref 0.44–1.00)
GFR, Estimated: >60 mL/min (ref >60)
Glucose, Bld: 203 mg/dL — ABNORMAL HIGH (ref 70–99)
Potassium: 4.3 mmol/L (ref 3.5–5.1)
Sodium: 134 mmol/L — ABNORMAL LOW (ref 135–145)
Total Bilirubin: 0.5 mg/dL (ref 0.0–1.2)
Total Protein: 6.7 g/dL (ref 6.5–8.1)

## 2023-04-10 LAB — CBC WITH DIFFERENTIAL (CANCER CENTER ONLY)
Abs Immature Granulocytes: 0.02 10*3/uL (ref 0.00–0.07)
Basophils Absolute: 0 10*3/uL (ref 0.0–0.1)
Basophils Relative: 1 %
Eosinophils Absolute: 0.1 10*3/uL (ref 0.0–0.5)
Eosinophils Relative: 1 %
HCT: 33.1 % — ABNORMAL LOW (ref 36.0–46.0)
Hemoglobin: 11.3 g/dL — ABNORMAL LOW (ref 12.0–15.0)
Immature Granulocytes: 0 %
Lymphocytes Relative: 11 %
Lymphs Abs: 0.6 10*3/uL — ABNORMAL LOW (ref 0.7–4.0)
MCH: 27 pg (ref 26.0–34.0)
MCHC: 34.1 g/dL (ref 30.0–36.0)
MCV: 79.2 fL — ABNORMAL LOW (ref 80.0–100.0)
Monocytes Absolute: 0.6 10*3/uL (ref 0.1–1.0)
Monocytes Relative: 12 %
Neutro Abs: 3.8 10*3/uL (ref 1.7–7.7)
Neutrophils Relative %: 75 %
Platelet Count: 213 10*3/uL (ref 150–400)
RBC: 4.18 MIL/uL (ref 3.87–5.11)
RDW: 14.1 % (ref 11.5–15.5)
WBC Count: 5 10*3/uL (ref 4.0–10.5)
nRBC: 0 % (ref 0.0–0.2)

## 2023-04-10 MED ORDER — GOSERELIN ACETATE 3.6 MG ~~LOC~~ IMPL
3.6000 mg | DRUG_IMPLANT | Freq: Once | SUBCUTANEOUS | Status: AC
  Administered 2023-04-10: 3.6 mg via SUBCUTANEOUS
  Filled 2023-04-10: qty 3.6

## 2023-04-10 NOTE — Assessment & Plan Note (Signed)
 stage IIIB ER/PR positive breast cancer diagnosed in 06/2022 currently receiving neoadjuvant chemotherapy.   06/27/2022: CT CAP: Left breast cancer, 2 left axillary lymph nodes, left adnexal cystic lesion 4 cm    Treatment Plan:  1. Neoadjuvant chemotherapy with Adriamycin  and Cytoxan  dose dense 4 followed by Taxol  weekly 10 completed 10/16/2022 2. 11/20/2022: Left lumpectomy: No residual cancer, margins negative, 0/14 lymph nodes 3. Followed by adjuvant radiation therapy 01/22/2023-03/21/2023 4.  Followed by antiestrogen therapy to start 04/03/2023 ____________________________________________________ Antiestrogen therapy: I recommended ovarian function suppression with Zoladex  along with anastrozole  with Verzenio  (start 04/10/23)X 2 years

## 2023-04-10 NOTE — Progress Notes (Signed)
 Patient Care Team: Nedra Tinnie LABOR, NP as PCP - General (Internal Medicine) Velma Raisin, MD as Consulting Physician (Family Medicine) Velma Raisin, MD as Consulting Physician (Family Medicine) Odean Potts, MD as Consulting Physician (Hematology and Oncology) Ebbie Cough, MD as Consulting Physician (General Surgery) Dewey Rush, MD as Consulting Physician (Radiation Oncology) Tyree Nanetta SAILOR, RN as Oncology Nurse Navigator Glean Stephane BROCKS, RN as Oncology Nurse Navigator  DIAGNOSIS:  Encounter Diagnosis  Name Primary?   Malignant neoplasm of overlapping sites of left breast in female, estrogen receptor positive (HCC) Yes    SUMMARY OF ONCOLOGIC HISTORY: Oncology History  Malignant neoplasm of overlapping sites of left breast in female, estrogen receptor positive (HCC)  05/29/2022 Initial Diagnosis   Palpable abnormalities in the left breast led to mammograms which revealed 3 masses 0.7 cm at 1 o'clock position, 3 cm at 2 o'clock position, additional nodule 8 mm total span 5 cm, 5 abnormal axillary lymph nodes biopsy of the masses and the lymph node were positive for grade 3 IDC with high-grade DCIS with LVI, ER 95%, PR 40%, Ki-67 60%, HER2 2+ by IHC FISH negative   06/15/2022 Cancer Staging   Staging form: Breast, AJCC 8th Edition - Clinical: Stage IIIB (cT3, cN3, cM0, G3, ER+, PR+, HER2-) - Signed by Crawford Morna Pickle, NP on 06/15/2022 Stage prefix: Initial diagnosis Histologic grading system: 3 grade system   06/15/2022 Breast MRI   IMPRESSION: 1. Multiple sites of biopsy proven malignancy with in the lateral left breast. The overall conglomerate of suspicious findings spans approximately 4.4 x 3.5 x 3.5 cm, and contains all 3 post biopsy marking clips. 2. 10+ morphologically abnormal level I, II, III and Rotter's lymph nodes within the left axilla. 3. Diffuse skin and trabecular thickening on the left, consistent with lymphovascular spread of disease. 4.  No MRI evidence of malignancy on the right.   06/20/2022 - 10/09/2022 Chemotherapy   Patient is on Treatment Plan : BREAST ADJUVANT DOSE DENSE AC q14d / PACLitaxel  q7d      Genetic Testing   [facility] Multi-Cancer Panel+RNA was Negative. Report date is 06/13/2022.  The Multi-Cancer + RNA Panel offered by [facility] includes sequencing and/or deletion/duplication analysis of the following 70 genes:  AIP*, ALK, APC*, ATM*, AXIN2*, BAP1*, BARD1*, BLM*, BMPR1A*, BRCA1*, BRCA2*, BRIP1*, CDC73*, CDH1*, CDK4, CDKN1B*, CDKN2A, CHEK2*, CTNNA1*, DICER1*, EPCAM (del/dup only), EGFR, FH*, FLCN*, GREM1 (promoter dup only), HOXB13, KIT, LZTR1, MAX*, MBD4, MEN1*, MET, MITF, MLH1*, MSH2*, MSH3*, MSH6*, MUTYH*, NF1*, NF2*, NTHL1*, PALB2*, PDGFRA, PMS2*, POLD1*, POLE*, POT1*, PRKAR1A*, PTCH1*, PTEN*, RAD51C*, RAD51D*, RB1*, RET, SDHA* (sequencing only), SDHAF2*, SDHB*, SDHC*, SDHD*, SMAD4*, SMARCA4*, SMARCB1*, SMARCE1*, STK11*, SUFU*, TMEM127*, TP53*, TSC1*, TSC2*, VHL*. RNA analysis is performed for * genes.   01/22/2023 - 03/21/2023 Radiation Therapy   Adjuvant radiation therapy     CHIEF COMPLIANT: Follow-up on anastrozole , Verzenio , Zoladex   HISTORY OF PRESENT ILLNESS:Lynn [name], a patient with a history of breast cancer, presents for follow-up after completing radiation therapy. She reports that her skin is still healing from the radiation, describing it as pretty much burned. She experienced a severe reaction to Neosporin, which she initially used for the raw skin, resulting in blistering. She has since stopped using Neosporin after a nurse friend suggested she might have developed an allergic reaction to neomycin.  In addition to her cancer treatment, she also has diabetes, which is managed with Ozempic . Her blood sugar levels fluctuate, but are generally improving. today she started anastrozole  and Verzinio  ALLERGIES:  is allergic to macrobid [nitrofurantoin macrocrystal] and  ibuprofen.  MEDICATIONS:  Current Outpatient Medications  Medication Sig Dispense Refill   abemaciclib  (VERZENIO ) 100 MG tablet Take 1 tablet (100 mg total) by mouth 2 (two) times daily. (Patient not taking: Reported on 03/20/2023) 60 tablet 3   ALPRAZolam  (XANAX ) 0.5 MG tablet TAKE 1 TABLET BY MOUTH AT BEDTIME AS NEEDED FOR ANXIETY 30 tablet 3   anastrozole  (ARIMIDEX ) 1 MG tablet Take 1 tablet (1 mg total) by mouth daily. (Patient not taking: Reported on 03/20/2023) 90 tablet 3   aspirin EC 81 MG tablet Take 81 mg by mouth daily. Swallow whole.     aspirin-acetaminophen -caffeine (EXCEDRIN MIGRAINE) 250-250-65 MG tablet Take 1-2 tablets by mouth 2 (two) times daily as needed for headache or migraine. (Patient not taking: Reported on 03/20/2023)     Blood Glucose Monitoring Suppl (ACCU-CHEK AVIVA PLUS) w/Device KIT 1 each by Does not apply route daily at 6 (six) AM. (Patient not taking: Reported on 02/18/2023) 1 kit 0   carvedilol  (COREG ) 25 MG tablet TAKE 1 TABLET(25 MG) BY MOUTH TWICE DAILY WITH A MEAL 180 tablet 0   famotidine  (PEPCID ) 20 MG tablet TAKE 1 TABLET(20 MG) BY MOUTH TWICE DAILY 180 tablet 0   glucose blood (ACCU-CHEK AVIVA PLUS) test strip Use as instructed (Patient not taking: Reported on 02/18/2023) 100 each 12   lidocaine  (XYLOCAINE ) 2 % jelly Apply 1 Application topically as needed. (Patient not taking: Reported on 11/09/2022) 85 g 0   ondansetron  (ZOFRAN ) 8 MG tablet Take 1 tablet (8 mg total) by mouth every 8 (eight) hours as needed for nausea or vomiting. 30 tablet 2   ONE TOUCH CLUB LANCETS MISC 1 each by Does not apply route daily. (Patient not taking: Reported on 02/18/2023) 100 each 11   OZEMPIC , 0.25 OR 0.5 MG/DOSE, 2 MG/3ML SOPN INJECT 0.5 INTO THE SKIN ONCE A WEEK 3 mL 2   No current facility-administered medications for this visit.   Facility-Administered Medications Ordered in Other Visits  Medication Dose Route Frequency Provider Last Rate Last Admin   goserelin  (ZOLADEX ) injection 3.6 mg  3.6 mg Subcutaneous Once Farris Geiman, MD        PHYSICAL EXAMINATION: ECOG PERFORMANCE STATUS: 1 - Symptomatic but completely ambulatory  Vitals:   04/10/23 0943  BP: (!) 140/77  Pulse: (!) 104  Resp: 18  Temp: 97.7 F (36.5 C)  SpO2: 100%   Filed Weights   04/10/23 0943  Weight: (!) 344 lb (156 kg)    Physical Exam          (exam performed in the presence of a chaperone)  LABORATORY DATA:  I have reviewed the data as listed    Latest Ref Rng & Units 04/10/2023    9:29 AM 03/20/2023    8:34 AM 11/15/2022    8:22 AM  CMP  Glucose 70 - 99 mg/dL 796  751  794   BUN 6 - 20 mg/dL 15  11  12    Creatinine 0.44 - 1.00 mg/dL 9.34  9.30  9.29   Sodium 135 - 145 mmol/L 134  134  135   Potassium 3.5 - 5.1 mmol/L 4.3  4.4  4.1   Chloride 98 - 111 mmol/L 102  101  101   CO2 22 - 32 mmol/L 25  28  25    Calcium 8.9 - 10.3 mg/dL 8.6  8.7  8.5   Total Protein 6.5 - 8.1 g/dL 6.7  6.4  Total Bilirubin 0.0 - 1.2 mg/dL 0.5  0.4    Alkaline Phos 38 - 126 U/L 66  83    AST 15 - 41 U/L 17  15    ALT 0 - 44 U/L 22  22      Lab Results  Component Value Date   WBC 5.0 04/10/2023   HGB 11.3 (L) 04/10/2023   HCT 33.1 (L) 04/10/2023   MCV 79.2 (L) 04/10/2023   PLT 213 04/10/2023   NEUTROABS 3.8 04/10/2023    ASSESSMENT & PLAN:  Malignant neoplasm of overlapping sites of left breast in female, 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   06/27/2022: CT CAP: Left breast cancer, 2 left axillary lymph nodes, left adnexal cystic lesion 4 cm    Treatment Plan:  1. Neoadjuvant chemotherapy with Adriamycin  and Cytoxan  dose dense 4 followed by Taxol  weekly 10 completed 10/16/2022 2. 11/20/2022: Left lumpectomy: No residual cancer, margins negative, 0/14 lymph nodes 3. Followed by adjuvant radiation therapy 01/22/2023-03/21/2023 4.  Followed by antiestrogen therapy to  start 04/03/2023 ____________________________________________________ Antiestrogen therapy: I recommended ovarian function suppression with Zoladex  along with anastrozole  with Verzenio  (start 04/10/23)X 2 years   Return to clinic in 2 weeks for follow-up with Lynn [name] with labs.  I will see the patient back in 4 weeks.    Orders Placed This Encounter  Procedures   CBC with Differential (Cancer Center Only)    Standing Status:   Future    Expiration Date:   04/09/2024   CMP (Cancer Center only)    Standing Status:   Future    Expiration Date:   04/09/2024   The patient has a good understanding of the overall plan. she agrees with it. she will call with any problems that may develop before the next visit here. Total time spent: 30 mins including face to face time and time spent for planning, charting and co-ordination of care   Lynn MARLA Chad, MD 04/10/23

## 2023-04-12 ENCOUNTER — Other Ambulatory Visit: Payer: Self-pay

## 2023-04-12 ENCOUNTER — Inpatient Hospital Stay: Payer: BC Managed Care – PPO

## 2023-04-18 ENCOUNTER — Other Ambulatory Visit (HOSPITAL_COMMUNITY): Payer: Self-pay

## 2023-04-22 ENCOUNTER — Ambulatory Visit
Admission: RE | Admit: 2023-04-22 | Discharge: 2023-04-22 | Disposition: A | Discharge status: Home / Self Care | Payer: BC Managed Care – PPO | Source: Ambulatory Visit | Attending: Adult Health | Admitting: Adult Health

## 2023-04-22 NOTE — Progress Notes (Signed)
  Radiation Oncology         [phone] ________________________________  Name: Lynn [name] MRN: [id number]  Date of Service: 04/22/2023  DOB: Feb 03, 1986  Post Treatment Telephone Note  Diagnosis:  Stage IIIB, AO1H0Q6, grade 3 ER/PR positive invasive ductal carcinoma of the left breast (as documented in provider EOT note)  The patient was not available for call today.   The patient was encouraged to avoid sun exposure in the area of prior treatment for up to one year following radiation with either sunscreen or by the style of clothing worn in the sun.  The patient has scheduled follow up with her medical oncologist Dr. Pamelia Hoit for ongoing surveillance, and was encouraged to call if she develops concerns or questions regarding radiation.    Ruel Favors, LPN

## 2023-04-23 ENCOUNTER — Other Ambulatory Visit (HOSPITAL_COMMUNITY): Payer: Self-pay

## 2023-04-23 NOTE — Progress Notes (Signed)
Specialty Pharmacy Refill Coordination Note  Lynn [name] is a 38 y.o. female contacted today regarding refills of specialty medication(s) Abemaciclib [name])   Patient requested Daryll Drown at [facility] at [place] date: 04/30/23   Medication will be filled on 04/29/23.

## 2023-04-23 NOTE — Progress Notes (Signed)
Specialty Pharmacy Ongoing Clinical Assessment Note  Lynn [name] is a 39 y.o. female who is being followed by the specialty pharmacy service for RxSp Oncology   Patient's specialty medication(s) reviewed today: Abemaciclib (VERZENIO)   Missed doses in the last 4 weeks: 0   Patient/Caregiver did not have any additional questions or concerns.   Therapeutic benefit summary: Patient is achieving benefit   Adverse events/side effects summary: Experienced adverse events/side effects (occasional diarrhea (Imonidum helps), headache, and fatigue, all are tolerable at this time)   Patient's therapy is appropriate to: Continue    Goals Addressed             This Visit's Progress    Maintain optimal adherence to therapy   On track    Patient is initiating therapy. Patient will maintain adherence         Follow up:  3 months  Servando Snare Specialty Pharmacist

## 2023-04-24 ENCOUNTER — Inpatient Hospital Stay: Payer: BC Managed Care – PPO | Admitting: Pharmacist

## 2023-04-24 ENCOUNTER — Inpatient Hospital Stay: Payer: BC Managed Care – PPO

## 2023-04-24 VITALS — BP 137/71 | HR 92 | Temp 97.7°F | Resp 18 | Ht 67.0 in | Wt 339.8 lb

## 2023-04-24 DIAGNOSIS — C50812 Malignant neoplasm of overlapping sites of left female breast: Secondary | ICD-10-CM

## 2023-04-24 DIAGNOSIS — Z7982 Long term (current) use of aspirin: Secondary | ICD-10-CM | POA: Diagnosis not present

## 2023-04-24 DIAGNOSIS — Z923 Personal history of irradiation: Secondary | ICD-10-CM | POA: Diagnosis not present

## 2023-04-24 DIAGNOSIS — Z79811 Long term (current) use of aromatase inhibitors: Secondary | ICD-10-CM | POA: Diagnosis not present

## 2023-04-24 DIAGNOSIS — Z7985 Long-term (current) use of injectable non-insulin antidiabetic drugs: Secondary | ICD-10-CM | POA: Diagnosis not present

## 2023-04-24 DIAGNOSIS — Z79899 Other long term (current) drug therapy: Secondary | ICD-10-CM | POA: Diagnosis not present

## 2023-04-24 DIAGNOSIS — E119 Type 2 diabetes mellitus without complications: Secondary | ICD-10-CM | POA: Diagnosis not present

## 2023-04-24 DIAGNOSIS — Z5111 Encounter for antineoplastic chemotherapy: Secondary | ICD-10-CM | POA: Diagnosis not present

## 2023-04-24 DIAGNOSIS — Z170 Estrogen receptor positive status [ER+]: Secondary | ICD-10-CM | POA: Diagnosis not present

## 2023-04-24 LAB — CBC WITH DIFFERENTIAL (CANCER CENTER ONLY)
Abs Immature Granulocytes: 0.01 10*3/uL (ref 0.00–0.07)
Basophils Absolute: 0 10*3/uL (ref 0.0–0.1)
Basophils Relative: 1 %
Eosinophils Absolute: 0.1 10*3/uL (ref 0.0–0.5)
Eosinophils Relative: 2 %
HCT: 35.2 % — ABNORMAL LOW (ref 36.0–46.0)
Hemoglobin: 11.8 g/dL — ABNORMAL LOW (ref 12.0–15.0)
Immature Granulocytes: 0 %
Lymphocytes Relative: 24 %
Lymphs Abs: 0.9 10*3/uL (ref 0.7–4.0)
MCH: 27.4 pg (ref 26.0–34.0)
MCHC: 33.5 g/dL (ref 30.0–36.0)
MCV: 81.9 fL (ref 80.0–100.0)
Monocytes Absolute: 0.4 10*3/uL (ref 0.1–1.0)
Monocytes Relative: 10 %
Neutro Abs: 2.4 10*3/uL (ref 1.7–7.7)
Neutrophils Relative %: 63 %
Platelet Count: 217 10*3/uL (ref 150–400)
RBC: 4.3 MIL/uL (ref 3.87–5.11)
RDW: 15 % (ref 11.5–15.5)
WBC Count: 3.8 10*3/uL — ABNORMAL LOW (ref 4.0–10.5)
nRBC: 0 % (ref 0.0–0.2)

## 2023-04-24 LAB — CMP (CANCER CENTER ONLY)
ALT: 25 U/L (ref 0–44)
AST: 23 U/L (ref 15–41)
Albumin: 4 g/dL (ref 3.5–5.0)
Alkaline Phosphatase: 65 U/L (ref 38–126)
Anion gap: 8 (ref 5–15)
BUN: 19 mg/dL (ref 6–20)
CO2: 26 mmol/L (ref 22–32)
Calcium: 9.1 mg/dL (ref 8.9–10.3)
Chloride: 101 mmol/L (ref 98–111)
Creatinine: 1 mg/dL (ref 0.44–1.00)
GFR, Estimated: >60 mL/min (ref >60)
Glucose, Bld: 145 mg/dL — ABNORMAL HIGH (ref 70–99)
Potassium: 4.1 mmol/L (ref 3.5–5.1)
Sodium: 135 mmol/L (ref 135–145)
Total Bilirubin: 0.5 mg/dL (ref 0.0–1.2)
Total Protein: 7.4 g/dL (ref 6.5–8.1)

## 2023-04-24 MED ORDER — GABAPENTIN 100 MG PO CAPS: 100.0000 mg | ORAL_CAPSULE | Freq: Three times a day (TID) | ORAL | 3 refills | Status: DC

## 2023-04-24 MED ORDER — PROCHLORPERAZINE MALEATE 10 MG PO TABS: 10.0000 mg | ORAL_TABLET | Freq: Four times a day (QID) | ORAL | 2 refills | Status: DC | PRN

## 2023-04-24 NOTE — Progress Notes (Signed)
[facility]       Telephone: [phone]?Fax: [phone]   Oncology Clinical Pharmacist Practitioner Progress Note  Lynn [name] was contacted via in-person to discuss her chemotherapy regimen for abemaciclib which they receive under the care of Dr. Serena Croissant.  Current treatment regimen and start date Abemaciclib (04/10/23) Anastrozole (04/09/23) Goserelin (04/10/23)  Interval History She continues on abemaciclib 100 mg by mouth every 12 hours on days 1 to 28 of a 28-day cycle. This is being given in combination with anastrozole and goserelin . Therapy is planned to continue until two years in the adjuvant setting per the [facility] trial data. She was last seen by Dr. Pamelia Hoit on 04/10/23 and clinical pharmacy on 03/20/23.  Response to Therapy Lynn [name]  is doing well. She is not having any loose stools, had some constipation with ondansetron and headaches. Headaches are in the morning and Excedrin helps. She has cut down on her caffeine intact as well. She will monitor. She requested prochlorperazine be sent and ondansetron be removed. This has been done.  She is also having some pain which she describes as neuropathic in nature which happens at night. She feels it started right around two weeks ago after she started anastrozole, abemaciclib, and goserelin. She will monitor for improvement but we did send gabapentin 100 mg by mouth TID today. She will see Dr. Pamelia Hoit again in two weeks when she is next due for goserelin. We also discontinued famotidine and added esomeprazole per her request to her medication list She is also reported some fatigue. Labs, vitals, treatment parameters, and manufacturer guidelines assessing toxicity were reviewed with Lynn [name] today. Based on these values, patient is in agreement to continue abemaciclib therapy at this time.  Allergies Allergies  Allergen Reactions   Macrobid [Nitrofurantoin Macrocrystal] Shortness Of Breath and Nausea And  Vomiting   Ibuprofen Hives    Vitals    04/24/2023    3:14 PM 04/10/2023    9:43 AM 03/20/2023    9:04 AM  Oncology Vitals  Height 170 cm 170 cm   Weight 154.132 kg 156.037 kg 156.037 kg  Weight (lbs) 339 lbs 13 oz 344 lbs 344 lbs  BMI 53.22 kg/m2 53.88 kg/m2 53.88 kg/m2  Temp 97.7 F (36.5 C) 97.7 F (36.5 C) 98.5 F (36.9 C)  Pulse Rate 92 104 107  BP 137/71 140/77 159/78  Resp 18 18 17   SpO2 98 % 100 % 97 %  BSA (m2) 2.7 m2 2.72 m2 2.72 m2    Laboratory Data    Latest Ref Rng & Units 04/24/2023    2:47 PM 04/10/2023    9:29 AM 03/20/2023    8:34 AM  CBC EXTENDED  WBC 4.0 - 10.5 K/uL 3.8  5.0  7.0   RBC 3.87 - 5.11 MIL/uL 4.30  4.18  4.49   Hemoglobin 12.0 - 15.0 g/dL 24.4  01.0  27.2   HCT 36.0 - 46.0 % 35.2  33.1  35.7   Platelets 150 - 400 K/uL 217  213  213   NEUT# 1.7 - 7.7 K/uL 2.4  3.8  5.6   Lymph# 0.7 - 4.0 K/uL 0.9  0.6  0.6        Latest Ref Rng & Units 04/24/2023    2:47 PM 04/10/2023    9:29 AM 03/20/2023    8:34 AM  CMP  Glucose 70 - 99 mg/dL 536  644  034   BUN 6 - 20 mg/dL 19  15  11   Creatinine 0.44 - 1.00 mg/dL 3.47  4.25  9.56   Sodium 135 - 145 mmol/L 135  134  134   Potassium 3.5 - 5.1 mmol/L 4.1  4.3  4.4   Chloride 98 - 111 mmol/L 101  102  101   CO2 22 - 32 mmol/L 26  25  28    Calcium 8.9 - 10.3 mg/dL 9.1  8.6  8.7   Total Protein 6.5 - 8.1 g/dL 7.4  6.7  6.4   Total Bilirubin 0.0 - 1.2 mg/dL 0.5  0.5  0.4   Alkaline Phos 38 - 126 U/L 65  66  83   AST 15 - 41 U/L 23  17  15    ALT 0 - 44 U/L 25  22  22      Adverse Effects Assessment Fatigue: monitor Neuropathic chest pain, mainly on left side by surgery site. Will try gabapentin Nausea: prefers prochlorperazine, removed ondansetron Headaches: will monitor. Happening in morning and do not last. No other symptoms  Adherence Assessment Lynn [name] reports missing 0 doses over the past 2 weeks.   Reason for missed dose: N/A Patient was re-educated on importance of adherence.    Access Assessment Lynn [name] is currently receiving her abemaciclib through [facility] concerns:  none  Medication Reconciliation The patient's medication list was reviewed today with the patient? Yes New medications or herbal supplements have recently been started? No  Any medications have been discontinued? No  The medication list was updated and reconciled based on the patient's most recent medication list in the electronic medical record (EMR) including herbal products and OTC medications.   Medications Current Outpatient Medications  Medication Sig Dispense Refill   abemaciclib (VERZENIO) 100 MG tablet Take 1 tablet (100 mg total) by mouth 2 (two) times daily. 60 tablet 3   ALPRAZolam (XANAX) 0.5 MG tablet TAKE 1 TABLET BY MOUTH AT BEDTIME AS NEEDED FOR ANXIETY 30 tablet 3   anastrozole (ARIMIDEX) 1 MG tablet Take 1 tablet (1 mg total) by mouth daily. 90 tablet 3   aspirin EC 81 MG tablet Take 81 mg by mouth daily. Swallow whole.     Blood Glucose Monitoring Suppl (ACCU-CHEK AVIVA PLUS) w/Device KIT 1 each by Does not apply route daily at 6 (six) AM. 1 kit 0   carvedilol (COREG) 25 MG tablet TAKE 1 TABLET(25 MG) BY MOUTH TWICE DAILY WITH A MEAL 180 tablet 0   esomeprazole (NEXIUM) 20 MG capsule Take 20 mg by mouth daily at 12 noon.     glucose blood (ACCU-CHEK AVIVA PLUS) test strip Use as instructed 100 each 12   lidocaine (XYLOCAINE) 2 % jelly Apply 1 Application topically as needed. 85 g 0   ONE TOUCH CLUB LANCETS MISC 1 each by Does not apply route daily. 100 each 11   OZEMPIC, 0.25 OR 0.5 MG/DOSE, 2 MG/3ML SOPN INJECT 0.5 INTO THE SKIN ONCE A WEEK 3 mL 2   aspirin-acetaminophen-caffeine (EXCEDRIN MIGRAINE) 250-250-65 MG tablet Take 1-2 tablets by mouth 2 (two) times daily as needed for headache or migraine.     gabapentin (NEURONTIN) 100 MG capsule Take 1 capsule (100 mg total) by mouth 3 (three) times daily. (Patient not taking: Reported on  04/24/2023) 90 capsule 3   prochlorperazine (COMPAZINE) 10 MG tablet Take 1 tablet (10 mg total) by mouth every 6 (six) hours as needed for nausea or vomiting. (Patient not taking: Reported on 04/24/2023) 30 tablet 2  No current facility-administered medications for this visit.   Drug-Drug Interactions (DDIs) DDIs were evaluated? Yes Significant DDIs? No  The patient was instructed to speak with their health care provider and/or the oral chemotherapy pharmacist before starting any new drug, including prescription or over the counter, natural / herbal products, or vitamins.  Supportive Care Diarrhea: we reviewed that diarrhea is common with abemaciclib and confirmed that she does have loperamide (Imodium) at home.  We reviewed how to take this medication PRN. Neutropenia: we discussed the importance of having a thermometer and what the Centers for Disease Control and Prevention (CDC) considers a fever which is 100.13F (38C) or higher.  Gave patient 24/7 triage line to call if any fevers or symptoms. ILD/Pneumonitis: we reviewed potential symptoms including cough, shortness, and fatigue.  VTE: reviewed signs of DVT such as leg swelling, redness, pain, or tenderness and signs of PE such as shortness of breath, rapid or irregular heartbeat, cough, chest pain, or lightheadedness. Reviewed to take the medication every 12 hours (with food sometimes can be easier on the stomach) and to take it at the same time every day. Hepatotoxicity:WNL Drug interactions with grapefruit products  Dosing Assessment Hepatic adjustments needed? No  Renal adjustments needed? No  Toxicity adjustments needed? No  The current dosing regimen is appropriate to continue at this time.  Follow-Up Plan Continue abemaciclib 100 by mouth BID Continue anastrozole 1 mg by mouth daily on 04/09/23 Continue goserelin 3.6 mg SubQ every 28 days. Next due 05/08/23 Start gabapentin 100 mg by mouth TID for left sided neuropathic pain  near surgical site. Onset is at night Monitor lymphopenia, sodium, and calcium. Labs, Dr. Pamelia Hoit visit scheduled for 05/09/23. Will add goserelin to this visit Dr. Pamelia Hoit will have Lynn [name] see clinical pharmacy as deemed necessary  Lynn [name] participated in the discussion, expressed understanding, and voiced agreement with the above plan. All questions were answered to her satisfaction. The patient was advised to contact the clinic at [phone] with any questions or concerns prior to her return visit.   I spent 30 minutes assessing and educating the patient.  Namon Villarin A. Odetta Pink, PharmD, BCOP, CPP  Anselm Lis, RPH-CPP, 04/24/2023  3:43 PM   **Disclaimer: This note was dictated with voice recognition software. Similar sounding words can inadvertently be transcribed and this note may contain transcription errors which may not have been corrected upon publication of note.**

## 2023-04-26 ENCOUNTER — Other Ambulatory Visit: Payer: BC Managed Care – PPO

## 2023-04-29 ENCOUNTER — Ambulatory Visit (INDEPENDENT_AMBULATORY_CARE_PROVIDER_SITE_OTHER): Payer: BC Managed Care – PPO | Admitting: Family Medicine

## 2023-04-29 ENCOUNTER — Other Ambulatory Visit: Payer: Self-pay

## 2023-04-29 ENCOUNTER — Ambulatory Visit: Payer: Self-pay | Admitting: Nurse Practitioner

## 2023-04-29 VITALS — BP 134/78 | HR 75 | Temp 97.4°F | Wt 343.6 lb

## 2023-04-29 DIAGNOSIS — R079 Chest pain, unspecified: Secondary | ICD-10-CM

## 2023-04-29 DIAGNOSIS — M25561 Pain in right knee: Secondary | ICD-10-CM | POA: Diagnosis not present

## 2023-04-29 DIAGNOSIS — M79661 Pain in right lower leg: Secondary | ICD-10-CM

## 2023-04-29 DIAGNOSIS — W19XXXA Unspecified fall, initial encounter: Secondary | ICD-10-CM

## 2023-04-29 DIAGNOSIS — C50919 Malignant neoplasm of unspecified site of unspecified female breast: Secondary | ICD-10-CM

## 2023-04-29 MED ORDER — BIOFREEZE 10 % EX CREA: 1.0000 | TOPICAL_CREAM | Freq: Three times a day (TID) | CUTANEOUS | Status: DC | PRN

## 2023-04-29 NOTE — Telephone Encounter (Signed)
Copied from CRM [id number]. Topic: Clinical - Red Word Triage >> Apr 29, 2023  8:13 AM Lynn [name] wrote: Red Word that prompted transfer to Nurse Triage: Patient had [name] fall on 04/23/23 trying to get in the bath, hurt her knee really bad. Put ice on the spot & elevated, still in pain.   Chief Complaint: Knee Pain Symptoms: Swelling, Bruising  Frequency: One Week Pertinent Negatives: Patient denies acute distress like symptoms.  Disposition: [] ED /[] Urgent Care (no appt availability in office) / [x] Appointment(In office/virtual)/ []  [facility]/ [] Home Care/ [] Refused Recommended Disposition /[] [facility]/ []  Follow-up with PCP Additional Notes: Lynn [name] is [name] 38 year old female being triaged for knee swelling and bruising after [name] fall one week ago. The patient reports having no bruising initially, but now the bruising has extending down her whole leg. Additionally, the patient reports, the knee started swelling immediately after injury.    Reason for Disposition  Large swelling or bruise (> 2 inches or 5 cm)  Answer Assessment - Initial Assessment Questions 1. MECHANISM: "How did the injury happen?" (e.g., twisting injury, direct blow)      Fell getting into the bath, all the weight onto one side of the knee before the fall.   2. ONSET: "When did the injury happen?" (Minutes or hours ago)      One Week Ago  3. LOCATION: "Where is the injury located?"      Right knee  4. APPEARANCE of INJURY: "What does the injury look like?"      Lump, and bruise extending down the shin  5. SEVERITY: "Can you put weight on that leg?" "Can you walk?"      Yes able to walk, sore, but not unbearable  6. SIZE: For cuts, bruises, or swelling, ask: "How large is it?" (e.g., inches or centimeters;  entire joint)      Bruise extending down the shin  7. PAIN: "Is there pain?" If Yes, ask: "How bad is the pain?"  "What does it keep you from doing?" (e.g., Scale 1-10; or mild, moderate, severe)    -  NONE: (0): no pain   -  MILD (1-3): doesn't interfere with normal activities    -  MODERATE (4-7): interferes with normal activities (e.g., work or school) or awakens from sleep, limping    -  SEVERE (8-10): excruciating pain, unable to do any normal activities, unable to walk     Moderate  8. TETANUS: For any breaks in the skin, ask: "When was the last tetanus booster?"     No  9. OTHER SYMPTOMS: "Do you have any other symptoms?"  (e.g., "pop" when knee injured, swelling, locking, buckling)      Swelling, Bruising (All the way down the leg)  10. PREGNANCY: "Is there any chance you are pregnant?" "When was your last menstrual period?"       No, Week after Christmas  Protocols used: Knee Injury-[name]-AH

## 2023-04-29 NOTE — Progress Notes (Signed)
Assessment/Plan:      Knee and Shin Trauma Acute pain and swelling in the knee and shin following a fall a week ago. Bruising noted. No signs of DVT. -Order X-ray of right knee and right shin to rule out fracture. -Continue current pain management with Tylenol and Aspirin as needed. -Prescribe topical Diclofenac gel for localized pain relief.  Chest Pain New onset chest pain around the same time as the fall. Unclear etiology due to concurrent nerve pain from recent lumpectomy. -Monitor chest pain. -Start Gabapentin as prescribed by oncologist for nerve pain. -Advise patient to seek emergency care if chest pain becomes severe.  Breast Cancer Patient recently started on Verzenio by oncology team. -Continue Verzenio as prescribed by oncology team.       There are no discontinued medications.  Return if symptoms worsen or fail to improve.    Subjective:   Encounter date: 04/29/2023  Rhema Boyett is a 38 y.o. female who has Essential hypertension; Chronic tachycardia; Generalized anxiety disorder with panic attacks; Family history of heart attack; LVH (left ventricular hypertrophy) due to hypertensive disease, without heart failure; Morbid obesity (HCC); Acute pain of both shoulders; Nerve pain; Chest pain; Epigastric pain; Cervical radicular pain; Snoring; Other fatigue; Syncope and collapse; Umbilical hernia without obstruction and without gangrene; Vitamin D deficiency; Type 2 diabetes mellitus (HCC); Malignant neoplasm of overlapping sites of left breast in female, estrogen receptor positive (HCC); Anxiety and depression; Genetic testing; Port-A-Cath in place; and Breast cancer (HCC) on their problem list..   She  has a past medical history of Anemia, Cancer (HCC) (2024), Carpal tunnel syndrome, left, Depression, Diabetes (HCC), Dysrhythmia, GERD (gastroesophageal reflux disease), Hypertension, Iron deficiency, Panic attacks, PCOS (polycystic ovarian syndrome), Pneumonia,  Tachycardia, and Umbilical hernia..   She presents with chief complaint of Joint Swelling (Knee Swelling, Large Bruise down shin after falling getting into bathtub. Has been icing and elevating x 5 days.) .   History of Present Illness   The patient, diagnosed with cancer and recently started on Verzenio, presented with a knee injury sustained a little over a week ago from a fall in the bath. The patient reported immediate swelling but no audible pop at the time of injury. The area of impact was on the side of the knee, which subsequently developed a hard lump that occasionally gives way. The patient noted that the lump becomes hot and swollen, particularly towards the end of the day after being on it, but does not cause pain when walking. However, bending the knee, such as when ascending stairs or sitting, triggers pain.  Approximately three days prior to the consultation, the patient noticed bruising on the leg, but not at the site of impact. The patient has been managing the pain with over-the-counter aspirin and sotret as needed, as they are unable to take ibuprofen. The patient reported that these medications provide some relief, but not significantly.  Around the same time as the fall, the patient began experiencing chest pain. The patient has been finding it difficult to determine the cause of this pain due to concurrent nerve pain from a previous lumpectomy. The patient was prescribed gabapentin for the nerve pain, which they were due to start taking on the day of the consultation. The patient expressed hope that the gabapentin would help differentiate between the nerve pain and any potential cardiac pain.      Past Surgical History:  Procedure Laterality Date   ADENOIDECTOMY     As a child  AXILLARY LYMPH NODE DISSECTION Left 11/20/2022   Procedure: LEFT AXILLARY DISSECTION;  Surgeon: Emelia Loron, MD;  Location: [facility];  Service: General;  Laterality: Left;   BREAST BIOPSY Left  05/29/2022   [place] LT BREAST BX W LOC DEV 1ST LESION IMG BX SPEC US GUIDE 05/29/2022 [facility]   BREAST BIOPSY Left 05/29/2022   [place] LT BREAST BX W LOC DEV EA ADD LESION IMG BX SPEC US GUIDE 05/29/2022 [facility]   BREAST BIOPSY Left 06/06/2022   MM LT BREAST BX W LOC DEV 1ST LESION IMAGE BX SPEC STEREO GUIDE 06/06/2022 [facility]   BREAST BIOPSY  11/19/2022   MM LT RADIOACTIVE SEED LOC MAMMO GUIDE 11/19/2022 [facility]   BREAST BIOPSY  11/19/2022   MM LT RADIOACTIVE SEED EA ADD LESION LOC MAMMO GUIDE 11/19/2022 [facility]   BREAST BIOPSY  11/19/2022   MM LT RADIOACTIVE SEED EA ADD LESION LOC MAMMO GUIDE 11/19/2022 [facility]   BREAST LUMPECTOMY WITH RADIOACTIVE SEED AND AXILLARY LYMPH NODE DISSECTION Left 11/20/2022   Procedure: LEFT BREAST LUMPECTOMY WITH RADIOACTIVE SEED;  Surgeon: Emelia Loron, MD;  Location: [facility] OR;  Service: General;  Laterality: Left;  PEC BLOCK   CESAREAN SECTION  2015   PORT-A-CATH REMOVAL Right 11/20/2022   Procedure: REMOVAL PORT-A-CATH;  Surgeon: Emelia Loron, MD;  Location: [facility] OR;  Service: General;  Laterality: Right;   PORTACATH PLACEMENT N/A 06/19/2022   Procedure: INSERTION PORT-A-CATH;  Surgeon: Emelia Loron, MD;  Location: [facility] OR;  Service: General;  Laterality: N/A;   TONSILLECTOMY     As a child    Outpatient Medications Prior to Visit  Medication Sig Dispense Refill   abemaciclib (VERZENIO) 100 MG tablet Take 1 tablet (100 mg total) by mouth 2 (two) times daily. 60 tablet 3   ALPRAZolam (XANAX) 0.5 MG tablet TAKE 1 TABLET BY MOUTH AT BEDTIME AS NEEDED FOR ANXIETY 30 tablet 3   anastrozole (ARIMIDEX) 1 MG tablet Take 1 tablet (1 mg total) by mouth daily. 90 tablet 3   aspirin EC 81 MG tablet Take 81 mg by mouth daily. Swallow whole.     aspirin-acetaminophen-caffeine (EXCEDRIN MIGRAINE) 250-250-65 MG tablet Take 1-2 tablets by mouth 2 (two) times daily as needed for headache or migraine.     Blood  Glucose Monitoring Suppl (ACCU-CHEK AVIVA PLUS) w/Device KIT 1 each by Does not apply route daily at 6 (six) AM. 1 kit 0   carvedilol (COREG) 25 MG tablet TAKE 1 TABLET(25 MG) BY MOUTH TWICE DAILY WITH A MEAL 180 tablet 0   esomeprazole (NEXIUM) 20 MG capsule Take 20 mg by mouth daily at 12 noon.     gabapentin (NEURONTIN) 100 MG capsule Take 1 capsule (100 mg total) by mouth 3 (three) times daily. 90 capsule 3   glucose blood (ACCU-CHEK AVIVA PLUS) test strip Use as instructed 100 each 12   ONE TOUCH CLUB LANCETS MISC 1 each by Does not apply route daily. 100 each 11   OZEMPIC, 0.25 OR 0.5 MG/DOSE, 2 MG/3ML SOPN INJECT 0.5 INTO THE SKIN ONCE A WEEK 3 mL 2   prochlorperazine (COMPAZINE) 10 MG tablet Take 1 tablet (10 mg total) by mouth every 6 (six) hours as needed for nausea or vomiting. 30 tablet 2   lidocaine (XYLOCAINE) 2 % jelly Apply 1 Application topically as needed. (Patient not taking: Reported on 04/29/2023) 85 g 0   No facility-administered medications prior to visit.    Family History  Problem Relation Age of  Onset   Heart disease Mother    Diabetes Mother    Hypertension Mother    Kidney disease Mother    Heart failure Mother    Heart attack Mother 5   Arthritis Mother    Asthma Mother    COPD Mother    Depression Mother    Hyperlipidemia Mother    Mental illness Mother    Bladder Cancer Mother    Heart disease Father    Heart attack Father 76   COPD Father    Hyperlipidemia Father    Hypertension Father    Mental illness Father    Arthritis Sister    Depression Sister    Diabetes Sister    Hypertension Sister    Mental illness Sister    Miscarriages / Stillbirths Sister    Alcohol abuse Sister    Depression Sister    Diabetes Sister    Heart disease Sister    Heart attack Sister 77   Arthritis Sister    Kidney disease Sister    Transient ischemic attack Sister    Bladder Cancer Maternal Aunt    Colon cancer Maternal Aunt 17 - 19   Other Maternal Aunt 86  - 59       brain tumor (unknown if benign or malignant)   Lung cancer Paternal Aunt 23   Melanoma Paternal Aunt 68 - 49   Heart failure Maternal Grandmother    Arthritis Maternal Grandmother    Alcohol abuse Maternal Grandmother    COPD Maternal Grandmother    Diabetes Maternal Grandmother    Hearing loss Maternal Grandmother    Heart disease Maternal Grandmother    Hyperlipidemia Maternal Grandmother    Hypertension Maternal Grandmother    Stroke Maternal Grandmother    Miscarriages / Stillbirths Maternal Grandmother    Mental illness Maternal Grandmother    Heart attack Maternal Grandmother    Heart failure Maternal Grandfather    Alcohol abuse Maternal Grandfather    Arthritis Maternal Grandfather    Early death Maternal Grandfather    Heart disease Maternal Grandfather    Hyperlipidemia Maternal Grandfather    Hypertension Maternal Grandfather    Heart attack Maternal Grandfather 60   Mental illness Maternal Grandfather    Heart failure Paternal Grandmother    Arthritis Paternal Grandmother    Diabetes Paternal Grandmother    Heart attack Paternal Grandmother    Miscarriages / Stillbirths Paternal Grandmother    Mental illness Paternal Grandmother    Kidney disease Paternal Grandmother    Intellectual disability Paternal Grandmother    Hypertension Paternal Grandmother    Hyperlipidemia Paternal Grandmother    Heart disease Paternal Grandmother    Heart failure Paternal Grandfather    Arthritis Paternal Grandfather    Heart attack Paternal Grandfather    Mental illness Paternal Grandfather    Hearing loss Paternal Grandfather    Heart disease Paternal Grandfather    Hyperlipidemia Paternal Grandfather    Hypertension Paternal Grandfather     Social History   Socioeconomic History   Marital status: Significant Other    Spouse name: Not on file   Number of children: 1   Years of education: some college   Highest education level: Some college, no degree   Occupational History   Not on file  Tobacco Use   Smoking status: Former    Current packs/day: 0.00    Average packs/day: 1 pack/day for 7.0 years (7.0 ttl pk-yrs)    Types: Cigarettes, E-cigarettes  Start date: 03/20/2007    Quit date: 10/29/2021    Years since quitting: 1.4   Smokeless tobacco: Never  Vaping Use   Vaping status: Former   Quit date: 04/03/2019  Substance and Sexual Activity   Alcohol use: Not Currently    Comment: rarely   Drug use: Yes    Types: Marijuana   Sexual activity: Yes    Birth control/protection: Condom  Other Topics Concern   Not on file  Social History Narrative      Social Drivers of Health   Financial Resource Strain: Medium Risk (04/29/2023)   Overall Financial Resource Strain (CARDIA)    Difficulty of Paying Living Expenses: Somewhat hard  Food Insecurity: Food Insecurity Present (04/29/2023)   Hunger Vital Sign    Worried About Running Out of Food in the Last Year: Sometimes true    Ran Out of Food in the Last Year: Sometimes true  Transportation Needs: No Transportation Needs (04/29/2023)   PRAPARE - Administrator, Civil Service (Medical): No    Lack of Transportation (Non-Medical): No  Physical Activity: Sufficiently Active (04/29/2023)   Exercise Vital Sign    Days of Exercise per Week: 5 days    Minutes of Exercise per Session: 30 min  Stress: Stress Concern Present (04/29/2023)   [employer] of Occupational Health - Occupational Stress Questionnaire    Feeling of Stress : To some extent  Social Connections: Socially Integrated (04/29/2023)   Social Connection and Isolation Panel [NHANES]    Frequency of Communication with Friends and Family: More than three times a week    Frequency of Social Gatherings with Friends and Family: Once a week    Attends Religious Services: More than 4 times per year    Active Member of [employer] or Organizations: Yes    Attends Engineer, structural: More than 4 times per year     Marital Status: Married  Catering manager Violence: Not At Risk (11/20/2022)   Humiliation, Afraid, Rape, and Kick questionnaire    Fear of Current or Ex-Partner: No    Emotionally Abused: No    Physically Abused: No    Sexually Abused: No                                                                                                  Objective:  Physical Exam: BP 134/78   Pulse 75   Temp (!) 97.4 F (36.3 C) (Temporal)   Wt (!) 343 lb 9.6 oz (155.9 kg)   SpO2 99%   BMI 53.82 kg/m     Physical Exam Constitutional:      General: She is not in acute distress.    Appearance: Normal appearance. She is not ill-appearing or toxic-appearing.  HENT:     Head: Normocephalic and atraumatic.     Nose: Nose normal. No congestion.  Eyes:     General: No scleral icterus.    Extraocular Movements: Extraocular movements intact.  Cardiovascular:     Rate and Rhythm: Normal rate and regular rhythm.     Pulses: Normal pulses.  Heart sounds: Normal heart sounds.  Pulmonary:     Effort: Pulmonary effort is normal. No respiratory distress.     Breath sounds: Normal breath sounds.  Abdominal:     General: Abdomen is flat. Bowel sounds are normal.     Palpations: Abdomen is soft.  Musculoskeletal:        General: Normal range of motion.     Right knee: Swelling present. Tenderness present.     Right lower leg: Swelling and tenderness present. Edema present.     Left lower leg: No edema.     Comments: Right lower extremity swelling and tenderness located on anterior portion of the knee and shin associated with contusion  Lymphadenopathy:     Cervical: No cervical adenopathy.  Skin:    General: Skin is warm and dry.     Findings: No rash.  Neurological:     General: No focal deficit present.     Mental Status: She is alert and oriented to person, place, and time. Mental status is at baseline.  Psychiatric:        Mood and Affect: Mood normal.        Behavior: Behavior normal.         Thought Content: Thought content normal.        Judgment: Judgment normal.     DG Chest 2 View Result Date: 03/11/2023 CLINICAL DATA:  Dry cough for 10 days, now with pleuritic right-sided chest pain for 2-3 days. EXAM: CHEST - 2 VIEW COMPARISON:  Chest CT 06/26/2022 FINDINGS: Interstitial prominence, possibly bronchitic airway thickening. No collapse or consolidation. No edema or effusion. Normal heart size and mediastinal contours. Left axillary clips. IMPRESSION: Equivocal from bronchitic airway thickening.  No pneumonia. Electronically Signed   By: Tiburcio Pea M.D.   On: 03/11/2023 10:34    Recent Results (from the past 2160 hours)  Rad Onc [facility]ria Session Summary     Status: None   Collection Time: 01/30/23  4:44 PM  Result Value Ref Range   Course ID C1_Breast    Course Intent Curative    Course Start Date 01/04/2023  9:43 AM    Session Number 8    Course First Treatment Date 01/21/2023 12:37 PM    Course Last Treatment Date 01/30/2023  4:43 PM    Course Elapsed Days 9    Reference Point ID [email or web address]    Reference Point Dosage Given to Date 14.4 Gy   Reference Point Session Dosage Given 1.8 Gy   Reference Point ID Brst_L_SCV_BH    Reference Point Dosage Given to Date 14.4 Gy   Reference Point Session Dosage Given 1.8 Gy   Plan ID Breast_L_BH    Plan Name Breast_L_BH    Plan Fractions Treated to Date 8    Plan Total Fractions Prescribed 28    Plan Prescribed Dose Per Fraction 1.8 Gy   Plan Total Prescribed Dose 50.400000 Gy   Plan Primary Reference Point Breast_L_BHdp    Plan ID Brst_L_SCV_BH    Plan Name Brst_L_SCV_BH    Plan Fractions Treated to Date 8    Plan Total Fractions Prescribed 28    Plan Prescribed Dose Per Fraction 1.8 Gy   Plan Total Prescribed Dose 50.400000 Gy   Plan Primary Reference Point Brst_L_SCV_BH   Rad Onc Aria Session Summary     Status: None   Collection Time: 02/01/23  3:34 PM  Result Value Ref Range   Course ID C1_Breast    Course  Intent Curative  Course Start Date 01/04/2023  9:43 AM    Session Number 9    Course First Treatment Date 01/21/2023 12:37 PM    Course Last Treatment Date 02/01/2023  3:34 PM    Course Elapsed Days 11    Reference Point ID [email or web address]    Reference Point Dosage Given to Date 16.2 Gy   Reference Point Session Dosage Given 1.8 Gy   Reference Point ID Brst_L_SCV_BH    Reference Point Dosage Given to Date 16.2 Gy   Reference Point Session Dosage Given 1.8 Gy   Plan ID Breast_L_BH    Plan Name Breast_L_BH    Plan Fractions Treated to Date 9    Plan Total Fractions Prescribed 28    Plan Prescribed Dose Per Fraction 1.8 Gy   Plan Total Prescribed Dose 50.400000 Gy   Plan Primary Reference Point Breast_L_BHdp    Plan ID Brst_L_SCV_BH    Plan Name Brst_L_SCV_BH    Plan Fractions Treated to Date 9    Plan Total Fractions Prescribed 28    Plan Prescribed Dose Per Fraction 1.8 Gy   Plan Total Prescribed Dose 50.400000 Gy   Plan Primary Reference Point Brst_L_SCV_BH   Rad Onc Aria Session Summary     Status: None   Collection Time: 02/05/23  4:03 PM  Result Value Ref Range   Course ID C1_Breast    Course Intent Curative    Course Start Date 01/04/2023  9:43 AM    Session Number 10    Course First Treatment Date 01/21/2023 12:37 PM    Course Last Treatment Date 02/05/2023  4:02 PM    Course Elapsed Days 15    Reference Point ID [email or web address]    Reference Point Dosage Given to Date 18 Gy   Reference Point Session Dosage Given 1.8 Gy   Reference Point ID Brst_L_SCV_BH    Reference Point Dosage Given to Date 18 Gy   Reference Point Session Dosage Given 1.8 Gy   Plan ID Brst_L_SCV_BH    Plan Name Brst_L_SCV_BH    Plan Fractions Treated to Date 10    Plan Total Fractions Prescribed 28    Plan Prescribed Dose Per Fraction 1.8 Gy   Plan Total Prescribed Dose 50.400000 Gy   Plan Primary Reference Point Brst_L_SCV_BH    Plan ID Breast_L_BH    Plan Name Breast_L_BH    Plan Fractions  Treated to Date 10    Plan Total Fractions Prescribed 28    Plan Prescribed Dose Per Fraction 1.8 Gy   Plan Total Prescribed Dose 50.400000 Gy   Plan Primary Reference Point Breast_L_BHdp   Rad Onc [facility]ria Session Summary     Status: None   Collection Time: 02/06/23  4:28 PM  Result Value Ref Range   Course ID C1_Breast    Course Intent Curative    Course Start Date 01/04/2023  9:43 AM    Session Number 11    Course First Treatment Date 01/21/2023 12:37 PM    Course Last Treatment Date 02/06/2023  4:27 PM    Course Elapsed Days 16    Reference Point ID [email or web address]    Reference Point Dosage Given to Date 19.8 Gy   Reference Point Session Dosage Given 1.8 Gy   Reference Point ID Brst_L_SCV_BH    Reference Point Dosage Given to Date 19.8 Gy   Reference Point Session Dosage Given 1.8 Gy   Plan ID Breast_L_BH    Plan Name Breast_L_BH    Plan Fractions Treated to Date 26  Plan Total Fractions Prescribed 28    Plan Prescribed Dose Per Fraction 1.8 Gy   Plan Total Prescribed Dose 50.400000 Gy   Plan Primary Reference Point Breast_L_BHdp    Plan ID Brst_L_SCV_BH    Plan Name Brst_L_SCV_BH    Plan Fractions Treated to Date 11    Plan Total Fractions Prescribed 28    Plan Prescribed Dose Per Fraction 1.8 Gy   Plan Total Prescribed Dose 50.400000 Gy   Plan Primary Reference Point Brst_L_SCV_BH   Rad Onc [facility]ria Session Summary     Status: None   Collection Time: 02/07/23  4:31 PM  Result Value Ref Range   Course ID C1_Breast    Course Intent Curative    Course Start Date 01/04/2023  9:43 AM    Session Number 12    Course First Treatment Date 01/21/2023 12:37 PM    Course Last Treatment Date 02/07/2023  4:30 PM    Course Elapsed Days 17    Reference Point ID [email or web address]    Reference Point Dosage Given to Date 21.6 Gy   Reference Point Session Dosage Given 1.8 Gy   Reference Point ID Brst_L_SCV_BH    Reference Point Dosage Given to Date 21.6 Gy   Reference Point Session Dosage Given  1.8 Gy   Plan ID Breast_L_BH    Plan Name Breast_L_BH    Plan Fractions Treated to Date 12    Plan Total Fractions Prescribed 28    Plan Prescribed Dose Per Fraction 1.8 Gy   Plan Total Prescribed Dose 50.400000 Gy   Plan Primary Reference Point Breast_L_BHdp    Plan ID Brst_L_SCV_BH    Plan Name Brst_L_SCV_BH    Plan Fractions Treated to Date 12    Plan Total Fractions Prescribed 28    Plan Prescribed Dose Per Fraction 1.8 Gy   Plan Total Prescribed Dose 50.400000 Gy   Plan Primary Reference Point Brst_L_SCV_BH   Rad Onc Aria Session Summary     Status: None   Collection Time: 02/20/23  4:11 PM  Result Value Ref Range   Course ID C1_Breast    Course Intent Curative    Course Start Date 01/04/2023  9:43 AM    Session Number 13    Course First Treatment Date 01/21/2023 12:37 PM    Course Last Treatment Date 02/20/2023  4:11 PM    Course Elapsed Days 30    Reference Point ID [email or web address]    Reference Point Dosage Given to Date 23.4 Gy   Reference Point Session Dosage Given 1.8 Gy   Reference Point ID Brst_L_SCV_BH    Reference Point Dosage Given to Date 23.4 Gy   Reference Point Session Dosage Given 1.8 Gy   Plan ID Breast_L_BH    Plan Name Breast_L_BH    Plan Fractions Treated to Date 13    Plan Total Fractions Prescribed 28    Plan Prescribed Dose Per Fraction 1.8 Gy   Plan Total Prescribed Dose 50.400000 Gy   Plan Primary Reference Point Breast_L_BHdp    Plan ID Brst_L_SCV_BH    Plan Name Brst_L_SCV_BH    Plan Fractions Treated to Date 13    Plan Total Fractions Prescribed 28    Plan Prescribed Dose Per Fraction 1.8 Gy   Plan Total Prescribed Dose 50.400000 Gy   Plan Primary Reference Point Brst_L_SCV_BH   Rad Onc [facility]ria Session Summary     Status: None   Collection Time: 02/21/23  4:30 PM  Result Value Ref Range   Course ID C1_Breast  Course Intent Curative    Course Start Date 01/04/2023  9:43 AM    Session Number 14    Course First Treatment Date 01/21/2023  12:37 PM    Course Last Treatment Date 02/21/2023  4:29 PM    Course Elapsed Days 31    Reference Point ID [email or web address]_BHdp    Reference Point Dosage Given to Date 25.2 Gy   Reference Point Session Dosage Given 1.8 Gy   Reference Point ID Brst_L_SCV_BH    Reference Point Dosage Given to Date 25.2 Gy   Reference Point Session Dosage Given 1.8 Gy   Plan ID Breast_L_BH    Plan Name Breast_L_BH    Plan Fractions Treated to Date 14    Plan Total Fractions Prescribed 28    Plan Prescribed Dose Per Fraction 1.8 Gy   Plan Total Prescribed Dose 50.400000 Gy   Plan Primary Reference Point Breast_L_BHdp    Plan ID Brst_L_SCV_BH    Plan Name Brst_L_SCV_BH    Plan Fractions Treated to Date 14    Plan Total Fractions Prescribed 28    Plan Prescribed Dose Per Fraction 1.8 Gy   Plan Total Prescribed Dose 50.400000 Gy   Plan Primary Reference Point Brst_L_SCV_BH   Rad Onc [facility]ria Session Summary     Status: None   Collection Time: 02/22/23  4:15 PM  Result Value Ref Range   Course ID C1_Breast    Course Intent Curative    Course Start Date 01/04/2023  9:43 AM    Session Number 15    Course First Treatment Date 01/21/2023 12:37 PM    Course Last Treatment Date 02/22/2023  4:14 PM    Course Elapsed Days 32    Reference Point ID Breast_L_BHdp    Reference Point Dosage Given to Date 27 Gy   Reference Point Session Dosage Given 1.8 Gy   Reference Point ID Brst_L_SCV_BH    Reference Point Dosage Given to Date 27 Gy   Reference Point Session Dosage Given 1.8 Gy   Plan ID Brst_L_SCV_BH    Plan Name Brst_L_SCV_BH    Plan Fractions Treated to Date 15    Plan Total Fractions Prescribed 28    Plan Prescribed Dose Per Fraction 1.8 Gy   Plan Total Prescribed Dose 50.400000 Gy   Plan Primary Reference Point Brst_L_SCV_BH    Plan ID Breast_L_BH    Plan Name Breast_L_BH    Plan Fractions Treated to Date 15    Plan Total Fractions Prescribed 28    Plan Prescribed Dose Per Fraction 1.8 Gy   Plan Total  Prescribed Dose 50.400000 Gy   Plan Primary Reference Point Breast_L_BHdp   Rad Onc [facility]ria Session Summary     Status: None   Collection Time: 02/24/23  7:27 AM  Result Value Ref Range   Course ID C1_Breast    Course Intent Curative    Course Start Date 01/04/2023  9:43 AM    Session Number 16    Course First Treatment Date 01/21/2023 12:37 PM    Course Last Treatment Date 02/24/2023  7:26 AM    Course Elapsed Days 34    Reference Point ID Breast_L_BHdp    Reference Point Dosage Given to Date 28.8 Gy   Reference Point Session Dosage Given 1.8 Gy   Reference Point ID Brst_L_SCV_BH    Reference Point Dosage Given to Date 28.8 Gy   Reference Point Session Dosage Given 1.8 Gy   Plan ID Brst_L_SCV_BH    Plan Name Brst_L_SCV_BH    Plan Fractions  Treated to Date 16    Plan Total Fractions Prescribed 28    Plan Prescribed Dose Per Fraction 1.8 Gy   Plan Total Prescribed Dose 50.400000 Gy   Plan Primary Reference Point Brst_L_SCV_BH    Plan ID Breast_L_BH    Plan Name Breast_L_BH    Plan Fractions Treated to Date 16    Plan Total Fractions Prescribed 28    Plan Prescribed Dose Per Fraction 1.8 Gy   Plan Total Prescribed Dose 50.400000 Gy   Plan Primary Reference Point Breast_L_BHdp   Rad Onc [facility]ria Session Summary     Status: None   Collection Time: 02/25/23  7:37 AM  Result Value Ref Range   Course ID C1_Breast    Course Intent Curative    Course Start Date 01/04/2023  9:43 AM    Session Number 17    Course First Treatment Date 01/21/2023 12:37 PM    Course Last Treatment Date 02/25/2023  7:36 AM    Course Elapsed Days 35    Reference Point ID Breast_L_BHdp    Reference Point Dosage Given to Date 30.6 Gy   Reference Point Session Dosage Given 1.8 Gy   Reference Point ID Brst_L_SCV_BH    Reference Point Dosage Given to Date 30.6 Gy   Reference Point Session Dosage Given 1.8 Gy   Plan ID Breast_L_BH    Plan Name Breast_L_BH    Plan Fractions Treated to Date 17    Plan Total  Fractions Prescribed 28    Plan Prescribed Dose Per Fraction 1.8 Gy   Plan Total Prescribed Dose 50.400000 Gy   Plan Primary Reference Point Breast_L_BHdp    Plan ID Brst_L_SCV_BH    Plan Name Brst_L_SCV_BH    Plan Fractions Treated to Date 17    Plan Total Fractions Prescribed 28    Plan Prescribed Dose Per Fraction 1.8 Gy   Plan Total Prescribed Dose 50.400000 Gy   Plan Primary Reference Point Brst_L_SCV_BH   Rad Onc Aria Session Summary     Status: None   Collection Time: 02/26/23  7:44 AM  Result Value Ref Range   Course ID C1_Breast    Course Intent Curative    Course Start Date 01/04/2023  9:43 AM    Session Number 18    Course First Treatment Date 01/21/2023 12:37 PM    Course Last Treatment Date 02/26/2023  7:43 AM    Course Elapsed Days 36    Reference Point ID Breast_L_BHdp    Reference Point Dosage Given to Date 32.4 Gy   Reference Point Session Dosage Given 1.8 Gy   Reference Point ID Brst_L_SCV_BH    Reference Point Dosage Given to Date 32.4 Gy   Reference Point Session Dosage Given 1.8 Gy   Plan ID Breast_L_BH    Plan Name Breast_L_BH    Plan Fractions Treated to Date 18    Plan Total Fractions Prescribed 28    Plan Prescribed Dose Per Fraction 1.8 Gy   Plan Total Prescribed Dose 50.400000 Gy   Plan Primary Reference Point Breast_L_BHdp    Plan ID Brst_L_SCV_BH    Plan Name Brst_L_SCV_BH    Plan Fractions Treated to Date 18    Plan Total Fractions Prescribed 28    Plan Prescribed Dose Per Fraction 1.8 Gy   Plan Total Prescribed Dose 50.400000 Gy   Plan Primary Reference Point Brst_L_SCV_BH   Rad Onc [facility]ria Session Summary     Status: None   Collection Time: 02/27/23  7:35 AM  Result Value Ref Range  Course ID C1_Breast    Course Intent Curative    Course Start Date 01/04/2023  9:43 AM    Session Number 19    Course First Treatment Date 01/21/2023 12:37 PM    Course Last Treatment Date 02/27/2023  7:34 AM    Course Elapsed Days 37    Reference Point ID  Breast_L_BHdp    Reference Point Dosage Given to Date 34.2 Gy   Reference Point Session Dosage Given 1.8 Gy   Reference Point ID Brst_L_SCV_BH    Reference Point Dosage Given to Date 34.2 Gy   Reference Point Session Dosage Given 1.8 Gy   Plan ID Breast_L_BH    Plan Name Breast_L_BH    Plan Fractions Treated to Date 19    Plan Total Fractions Prescribed 28    Plan Prescribed Dose Per Fraction 1.8 Gy   Plan Total Prescribed Dose 50.400000 Gy   Plan Primary Reference Point Breast_L_BHdp    Plan ID Brst_L_SCV_BH    Plan Name Brst_L_SCV_BH    Plan Fractions Treated to Date 19    Plan Total Fractions Prescribed 28    Plan Prescribed Dose Per Fraction 1.8 Gy   Plan Total Prescribed Dose 50.400000 Gy   Plan Primary Reference Point Brst_L_SCV_BH   Rad Onc [facility]ria Session Summary     Status: None   Collection Time: 03/04/23  7:45 AM  Result Value Ref Range   Course ID C1_Breast    Course Intent Curative    Course Start Date 01/04/2023  9:43 AM    Session Number 20    Course First Treatment Date 01/21/2023 12:37 PM    Course Last Treatment Date 03/04/2023  7:44 AM    Course Elapsed Days 42    Reference Point ID Breast_L_BHdp    Reference Point Dosage Given to Date 36 Gy   Reference Point Session Dosage Given 1.8 Gy   Reference Point ID Brst_L_SCV_BH    Reference Point Dosage Given to Date 36 Gy   Reference Point Session Dosage Given 1.8 Gy   Plan ID Breast_L_BH    Plan Name Breast_L_BH    Plan Fractions Treated to Date 20    Plan Total Fractions Prescribed 28    Plan Prescribed Dose Per Fraction 1.8 Gy   Plan Total Prescribed Dose 50.400000 Gy   Plan Primary Reference Point Breast_L_BHdp    Plan ID Brst_L_SCV_BH    Plan Name Brst_L_SCV_BH    Plan Fractions Treated to Date 20    Plan Total Fractions Prescribed 28    Plan Prescribed Dose Per Fraction 1.8 Gy   Plan Total Prescribed Dose 50.400000 Gy   Plan Primary Reference Point Brst_L_SCV_BH   Rad Onc [facility]ria Session Summary      Status: None   Collection Time: 03/05/23  1:59 PM  Result Value Ref Range   Course ID C1_Breast    Course Intent Curative    Course Start Date 01/04/2023  9:43 AM    Session Number 21    Course First Treatment Date 01/21/2023 12:37 PM    Course Last Treatment Date 03/05/2023  1:58 PM    Course Elapsed Days 43    Reference Point ID Breast_L_BHdp    Reference Point Dosage Given to Date 37.8 Gy   Reference Point Session Dosage Given 1.8 Gy   Reference Point ID Brst_L_SCV_BH    Reference Point Dosage Given to Date 37.8 Gy   Reference Point Session Dosage Given 1.8 Gy   Plan ID Brst_L_SCV_BH    Plan Name  Brst_L_SCV_BH    Plan Fractions Treated to Date 21    Plan Total Fractions Prescribed 28    Plan Prescribed Dose Per Fraction 1.8 Gy   Plan Total Prescribed Dose 50.400000 Gy   Plan Primary Reference Point Brst_L_SCV_BH    Plan ID Breast_L_BH    Plan Name Breast_L_BH    Plan Fractions Treated to Date 21    Plan Total Fractions Prescribed 28    Plan Prescribed Dose Per Fraction 1.8 Gy   Plan Total Prescribed Dose 50.400000 Gy   Plan Primary Reference Point Breast_L_BHdp   Rad Onc [facility]ria Session Summary     Status: None   Collection Time: 03/06/23  7:52 AM  Result Value Ref Range   Course ID C1_Breast    Course Intent Curative    Course Start Date 01/04/2023  9:43 AM    Session Number 22    Course First Treatment Date 01/21/2023 12:37 PM    Course Last Treatment Date 03/06/2023  7:51 AM    Course Elapsed Days 44    Reference Point ID Breast_L_BHdp    Reference Point Dosage Given to Date 39.6 Gy   Reference Point Session Dosage Given 1.8 Gy   Reference Point ID Brst_L_SCV_BH    Reference Point Dosage Given to Date 39.6 Gy   Reference Point Session Dosage Given 1.8 Gy   Plan ID Breast_L_BH    Plan Name Breast_L_BH    Plan Fractions Treated to Date 22    Plan Total Fractions Prescribed 28    Plan Prescribed Dose Per Fraction 1.8 Gy   Plan Total Prescribed Dose 50.400000 Gy   Plan  Primary Reference Point Breast_L_BHdp    Plan ID Brst_L_SCV_BH    Plan Name Brst_L_SCV_BH    Plan Fractions Treated to Date 22    Plan Total Fractions Prescribed 28    Plan Prescribed Dose Per Fraction 1.8 Gy   Plan Total Prescribed Dose 50.400000 Gy   Plan Primary Reference Point Brst_L_SCV_BH   Rad Onc [facility]ria Session Summary     Status: None   Collection Time: 03/07/23  7:43 AM  Result Value Ref Range   Course ID C1_Breast    Course Intent Curative    Course Start Date 01/04/2023  9:43 AM    Session Number 23    Course First Treatment Date 01/21/2023 12:37 PM    Course Last Treatment Date 03/07/2023  7:43 AM    Course Elapsed Days 45    Reference Point ID Breast_L_BHdp    Reference Point Dosage Given to Date 41.4 Gy   Reference Point Session Dosage Given 1.8 Gy   Reference Point ID Brst_L_SCV_BH    Reference Point Dosage Given to Date 41.4 Gy   Reference Point Session Dosage Given 1.8 Gy   Plan ID Breast_L_BH    Plan Name Breast_L_BH    Plan Fractions Treated to Date 23    Plan Total Fractions Prescribed 28    Plan Prescribed Dose Per Fraction 1.8 Gy   Plan Total Prescribed Dose 50.400000 Gy   Plan Primary Reference Point Breast_L_BHdp    Plan ID Brst_L_SCV_BH    Plan Name Brst_L_SCV_BH    Plan Fractions Treated to Date 23    Plan Total Fractions Prescribed 28    Plan Prescribed Dose Per Fraction 1.8 Gy   Plan Total Prescribed Dose 50.400000 Gy   Plan Primary Reference Point Brst_L_SCV_BH   Rad Onc [facility]ria Session Summary     Status: None   Collection Time: 03/08/23  7:53  AM  Result Value Ref Range   Course ID C1_Breast    Course Intent Curative    Course Start Date 01/04/2023  9:43 AM    Session Number 24    Course First Treatment Date 01/21/2023 12:37 PM    Course Last Treatment Date 03/08/2023  7:52 AM    Course Elapsed Days 46    Reference Point ID Breast_L_BHdp    Reference Point Dosage Given to Date 43.2 Gy   Reference Point Session Dosage Given 1.8 Gy   Reference  Point ID Brst_L_SCV_BH    Reference Point Dosage Given to Date 43.2 Gy   Reference Point Session Dosage Given 1.8 Gy   Plan ID Breast_L_BH    Plan Name Breast_L_BH    Plan Fractions Treated to Date 24    Plan Total Fractions Prescribed 28    Plan Prescribed Dose Per Fraction 1.8 Gy   Plan Total Prescribed Dose 50.400000 Gy   Plan Primary Reference Point Breast_L_BHdp    Plan ID Brst_L_SCV_BH    Plan Name Brst_L_SCV_BH    Plan Fractions Treated to Date 24    Plan Total Fractions Prescribed 28    Plan Prescribed Dose Per Fraction 1.8 Gy   Plan Total Prescribed Dose 50.400000 Gy   Plan Primary Reference Point Brst_L_SCV_BH   Rad Onc [facility]ria Session Summary     Status: None   Collection Time: 03/11/23  8:29 AM  Result Value Ref Range   Course ID C1_Breast    Course Intent Curative    Course Start Date 01/04/2023  9:43 AM    Session Number 25    Course First Treatment Date 01/21/2023 12:37 PM    Course Last Treatment Date 03/11/2023  8:28 AM    Course Elapsed Days 49    Reference Point ID Breast_L_BHdp    Reference Point Dosage Given to Date 45 Gy   Reference Point Session Dosage Given 1.8 Gy   Reference Point ID Brst_L_SCV_BH    Reference Point Dosage Given to Date 45 Gy   Reference Point Session Dosage Given 1.8 Gy   Plan ID Brst_L_SCV_BH    Plan Name Brst_L_SCV_BH    Plan Fractions Treated to Date 25    Plan Total Fractions Prescribed 28    Plan Prescribed Dose Per Fraction 1.8 Gy   Plan Total Prescribed Dose 50.400000 Gy   Plan Primary Reference Point Brst_L_SCV_BH    Plan ID Breast_L_BH    Plan Name Breast_L_BH    Plan Fractions Treated to Date 25    Plan Total Fractions Prescribed 28    Plan Prescribed Dose Per Fraction 1.8 Gy   Plan Total Prescribed Dose 50.400000 Gy   Plan Primary Reference Point Breast_L_BHdp   Rad Onc [facility]ria Session Summary     Status: None   Collection Time: 03/14/23  8:05 AM  Result Value Ref Range   Course ID C1_Breast    Course Intent Curative     Course Start Date 01/04/2023  9:43 AM    Session Number 26    Course First Treatment Date 01/21/2023 12:37 PM    Course Last Treatment Date 03/14/2023  8:03 AM    Course Elapsed Days 52    Reference Point ID Breast_L_BHdp    Reference Point Dosage Given to Date 46.8 Gy   Reference Point Session Dosage Given 1.8 Gy   Reference Point ID Brst_L_SCV_BH    Reference Point Dosage Given to Date 46.8 Gy   Reference Point Session Dosage Given 1.8 Gy  Plan ID Breast_L_BH    Plan Name Breast_L_BH    Plan Fractions Treated to Date 26    Plan Total Fractions Prescribed 28    Plan Prescribed Dose Per Fraction 1.8 Gy   Plan Total Prescribed Dose 50.400000 Gy   Plan Primary Reference Point Breast_L_BHdp    Plan ID Brst_L_SCV_BH    Plan Name Brst_L_SCV_BH    Plan Fractions Treated to Date 26    Plan Total Fractions Prescribed 28    Plan Prescribed Dose Per Fraction 1.8 Gy   Plan Total Prescribed Dose 50.400000 Gy   Plan Primary Reference Point Brst_L_SCV_BH   Rad Onc [facility]ria Session Summary     Status: None   Collection Time: 03/15/23  8:18 AM  Result Value Ref Range   Course ID C1_Breast    Course Intent Curative    Course Start Date 01/04/2023  9:43 AM    Session Number 27    Course First Treatment Date 01/21/2023 12:37 PM    Course Last Treatment Date 03/15/2023  8:16 AM    Course Elapsed Days 53    Reference Point ID Breast_L_BHdp    Reference Point Dosage Given to Date 48.6 Gy   Reference Point Session Dosage Given 1.8 Gy   Reference Point ID Brst_L_SCV_BH    Reference Point Dosage Given to Date 48.6 Gy   Reference Point Session Dosage Given 1.8 Gy   Plan ID Breast_L_BH    Plan Name Breast_L_BH    Plan Fractions Treated to Date 27    Plan Total Fractions Prescribed 28    Plan Prescribed Dose Per Fraction 1.8 Gy   Plan Total Prescribed Dose 50.400000 Gy   Plan Primary Reference Point Breast_L_BHdp    Plan ID Brst_L_SCV_BH    Plan Name Brst_L_SCV_BH    Plan Fractions Treated to Date  27    Plan Total Fractions Prescribed 28    Plan Prescribed Dose Per Fraction 1.8 Gy   Plan Total Prescribed Dose 50.400000 Gy   Plan Primary Reference Point Brst_L_SCV_BH   Rad Onc [facility]ria Session Summary     Status: None   Collection Time: 03/18/23  8:04 AM  Result Value Ref Range   Course ID C1_Breast    Course Intent Curative    Course Start Date 01/04/2023  9:43 AM    Session Number 28    Course First Treatment Date 01/21/2023 12:37 PM    Course Last Treatment Date 03/18/2023  8:02 AM    Course Elapsed Days 56    Reference Point ID Breast_L_BHdp    Reference Point Dosage Given to Date 50.4 Gy   Reference Point Session Dosage Given 1.8 Gy   Reference Point ID Brst_L_SCV_BH    Reference Point Dosage Given to Date 50.4 Gy   Reference Point Session Dosage Given 1.8 Gy   Plan ID Breast_L_BH    Plan Name Breast_L_BH    Plan Fractions Treated to Date 28    Plan Total Fractions Prescribed 28    Plan Prescribed Dose Per Fraction 1.8 Gy   Plan Total Prescribed Dose 50.400000 Gy   Plan Primary Reference Point Breast_L_BHdp    Plan ID Brst_L_SCV_BH    Plan Name Brst_L_SCV_BH    Plan Fractions Treated to Date 28    Plan Total Fractions Prescribed 28    Plan Prescribed Dose Per Fraction 1.8 Gy   Plan Total Prescribed Dose 50.400000 Gy   Plan Primary Reference Point Brst_L_SCV_BH   Rad Onc [facility]ria Session Summary     Status:  None   Collection Time: 03/19/23  8:09 AM  Result Value Ref Range   Course ID C1_Breast    Course Intent Curative    Course Start Date 01/04/2023  9:43 AM    Session Number 29    Course First Treatment Date 01/21/2023 12:37 PM    Course Last Treatment Date 03/19/2023  8:08 AM    Course Elapsed Days 57    Reference Point ID Brst_L_BH_Bst_dp    Reference Point Dosage Given to Date 2 Gy   Reference Point Session Dosage Given 2 Gy   Plan ID Brst_L_BH_Bst    Plan Name Brst_L_BH_Bst    Plan Fractions Treated to Date 1    Plan Total Fractions Prescribed 5    Plan  Prescribed Dose Per Fraction 2 Gy   Plan Total Prescribed Dose 10.000000 Gy   Plan Primary Reference Point Brst_L_[email or web address]_dp   Rad Onc Aria Session Summary     Status: None   Collection Time: 03/20/23  8:04 AM  Result Value Ref Range   Course ID C1_Breast    Course Intent Curative    Course Start Date 01/04/2023  9:43 AM    Session Number 30    Course First Treatment Date 01/21/2023 12:37 PM    Course Last Treatment Date 03/20/2023  8:04 AM    Course Elapsed Days 58    Reference Point ID Brst_L_BH_Bst_dp    Reference Point Dosage Given to Date 4 Gy   Reference Point Session Dosage Given 2 Gy   Plan ID Brst_L_BH_Bst    Plan Name Brst_L_BH_Bst    Plan Fractions Treated to Date 2    Plan Total Fractions Prescribed 5    Plan Prescribed Dose Per Fraction 2 Gy   Plan Total Prescribed Dose 10.000000 Gy   Plan Primary Reference Point [email or web address]_dp   CMP (Cancer Center only)     Status: Abnormal   Collection Time: 03/20/23  8:34 AM  Result Value Ref Range   Sodium 134 (L) 135 - 145 mmol/L   Potassium 4.4 3.5 - 5.1 mmol/L   Chloride 101 98 - 111 mmol/L   CO2 28 22 - 32 mmol/L   Glucose, Bld 248 (H) 70 - 99 mg/dL    Comment: Glucose reference range applies only to samples taken after fasting for at least 8 hours.   BUN 11 6 - 20 mg/dL   Creatinine 7.82 9.56 - 1.00 mg/dL   Calcium 8.7 (L) 8.9 - 10.3 mg/dL   Total Protein 6.4 (L) 6.5 - 8.1 g/dL   Albumin 3.7 3.5 - 5.0 g/dL   AST 15 15 - 41 U/L   ALT 22 0 - 44 U/L   Alkaline Phosphatase 83 38 - 126 U/L   Total Bilirubin 0.4 <1.2 mg/dL   GFR, Estimated >21 >30 mL/min    Comment: (NOTE) Calculated using the CKD-EPI Creatinine Equation (2021)    Anion gap 5 5 - 15    Comment: Performed at [facility], [address], [place], [place] [address]  CBC with Differential (Cancer Center Only)     Status: Abnormal   Collection Time: 03/20/23  8:34 AM  Result Value Ref Range   WBC Count 7.0 4.0 - 10.5 K/uL   RBC  4.49 3.87 - 5.11 MIL/uL   Hemoglobin 12.2 12.0 - 15.0 g/dL   HCT 46.9 (L) 62.9 - 52.8 %   MCV 79.5 (L) 80.0 - 100.0 fL   MCH 27.2 26.0 - 34.0 pg   MCHC  34.2 30.0 - 36.0 g/dL   RDW 16.1 09.6 - 04.5 %   Platelet Count 213 150 - 400 K/uL   nRBC 0.0 0.0 - 0.2 %   Neutrophils Relative % 79 %   Neutro Abs 5.6 1.7 - 7.7 K/uL   Lymphocytes Relative 9 %   Lymphs Abs 0.6 (L) 0.7 - 4.0 K/uL   Monocytes Relative 10 %   Monocytes Absolute 0.7 0.1 - 1.0 K/uL   Eosinophils Relative 1 %   Eosinophils Absolute 0.1 0.0 - 0.5 K/uL   Basophils Relative 1 %   Basophils Absolute 0.1 0.0 - 0.1 K/uL   Immature Granulocytes 0 %   Abs Immature Granulocytes 0.03 0.00 - 0.07 K/uL    Comment: Performed at [facility], [address], [place], [place] [address]  Rad Jeralene Huff Session Summary     Status: None   Collection Time: 03/21/23  8:19 AM  Result Value Ref Range   Course ID C1_Breast    Course Intent Curative    Course Start Date 01/04/2023  9:43 AM    Session Number 31    Course First Treatment Date 01/21/2023 12:37 PM    Course Last Treatment Date 03/21/2023  8:17 AM    Course Elapsed Days 59    Reference Point ID Brst_L_BH_Bst_dp    Reference Point Dosage Given to Date 6 Gy   Reference Point Session Dosage Given 2 Gy   Plan ID Brst_L_BH_Bst    Plan Name Brst_L_BH_Bst    Plan Fractions Treated to Date 3    Plan Total Fractions Prescribed 5    Plan Prescribed Dose Per Fraction 2 Gy   Plan Total Prescribed Dose 10.000000 Gy   Plan Primary Reference Point [email or web address]_[email or web address]_dp   Rad Jeralene Huff Session Summary     Status: None   Collection Time: 03/22/23  9:11 AM  Result Value Ref Range   Course ID C1_Breast    Course Intent Curative    Course Start Date 01/04/2023  9:43 AM    Session Number 32    Course First Treatment Date 01/21/2023 12:37 PM    Course Last Treatment Date 03/22/2023  9:10 AM    Course Elapsed Days 60    Reference Point ID Brst_L_BH_Bst_dp    Reference  Point Dosage Given to Date 8 Gy   Reference Point Session Dosage Given 2 Gy   Plan ID Brst_L_BH_Bst    Plan Name Brst_L_BH_Bst    Plan Fractions Treated to Date 4    Plan Total Fractions Prescribed 5    Plan Prescribed Dose Per Fraction 2 Gy   Plan Total Prescribed Dose 10.000000 Gy   Plan Primary Reference Point [email or web address]_[email or web address]_dp   Rad Jeralene Huff Session Summary     Status: None   Collection Time: 03/25/23  9:43 AM  Result Value Ref Range   Course ID C1_Breast    Course Intent Curative    Course Start Date 01/04/2023  9:43 AM    Session Number 33    Course First Treatment Date 01/21/2023 12:37 PM    Course Last Treatment Date 03/25/2023  9:42 AM    Course Elapsed Days 63    Reference Point ID Brst_L_BH_Bst_dp    Reference Point Dosage Given to Date 10 Gy   Reference Point Session Dosage Given 2 Gy   Plan ID Brst_L_BH_Bst    Plan Name Brst_L_BH_Bst    Plan Fractions Treated to Date 5    Plan Total Fractions Prescribed 5  Plan Prescribed Dose Per Fraction 2 Gy   Plan Total Prescribed Dose 10.000000 Gy   Plan Primary Reference Point [email or web address]_dp   CMP (Cancer Center only)     Status: Abnormal   Collection Time: 04/10/23  9:29 AM  Result Value Ref Range   Sodium 134 (L) 135 - 145 mmol/L   Potassium 4.3 3.5 - 5.1 mmol/L   Chloride 102 98 - 111 mmol/L   CO2 25 22 - 32 mmol/L   Glucose, Bld 203 (H) 70 - 99 mg/dL    Comment: Glucose reference range applies only to samples taken after fasting for at least 8 hours.   BUN 15 6 - 20 mg/dL   Creatinine 1.61 0.96 - 1.00 mg/dL   Calcium 8.6 (L) 8.9 - 10.3 mg/dL   Total Protein 6.7 6.5 - 8.1 g/dL   Albumin 3.7 3.5 - 5.0 g/dL   AST 17 15 - 41 U/L   ALT 22 0 - 44 U/L   Alkaline Phosphatase 66 38 - 126 U/L   Total Bilirubin 0.5 0.0 - 1.2 mg/dL   GFR, Estimated >04 >54 mL/min    Comment: (NOTE) Calculated using the CKD-EPI Creatinine Equation (2021)    Anion gap 7 5 - 15    Comment: Performed at [facility], [address], [place], [place] [address]  CBC with Differential (Cancer Center Only)     Status: Abnormal   Collection Time: 04/10/23  9:29 AM  Result Value Ref Range   WBC Count 5.0 4.0 - 10.5 K/uL   RBC 4.18 3.87 - 5.11 MIL/uL   Hemoglobin 11.3 (L) 12.0 - 15.0 g/dL   HCT 91.4 (L) 78.2 - 95.6 %   MCV 79.2 (L) 80.0 - 100.0 fL   MCH 27.0 26.0 - 34.0 pg   MCHC 34.1 30.0 - 36.0 g/dL   RDW 21.3 08.6 - 57.8 %   Platelet Count 213 150 - 400 K/uL   nRBC 0.0 0.0 - 0.2 %   Neutrophils Relative % 75 %   Neutro Abs 3.8 1.7 - 7.7 K/uL   Lymphocytes Relative 11 %   Lymphs Abs 0.6 (L) 0.7 - 4.0 K/uL   Monocytes Relative 12 %   Monocytes Absolute 0.6 0.1 - 1.0 K/uL   Eosinophils Relative 1 %   Eosinophils Absolute 0.1 0.0 - 0.5 K/uL   Basophils Relative 1 %   Basophils Absolute 0.0 0.0 - 0.1 K/uL   Immature Granulocytes 0 %   Abs Immature Granulocytes 0.02 0.00 - 0.07 K/uL    Comment: Performed at [facility], [address], [place], [place] [address]  CMP (Cancer Center only)     Status: Abnormal   Collection Time: 04/24/23  2:47 PM  Result Value Ref Range   Sodium 135 135 - 145 mmol/L   Potassium 4.1 3.5 - 5.1 mmol/L   Chloride 101 98 - 111 mmol/L   CO2 26 22 - 32 mmol/L   Glucose, Bld 145 (H) 70 - 99 mg/dL    Comment: Glucose reference range applies only to samples taken after fasting for at least 8 hours.   BUN 19 6 - 20 mg/dL   Creatinine 9.52 8.41 - 1.00 mg/dL   Calcium 9.1 8.9 - 32.4 mg/dL   Total Protein 7.4 6.5 - 8.1 g/dL   Albumin 4.0 3.5 - 5.0 g/dL   AST 23 15 - 41 U/L   ALT 25 0 - 44 U/L   Alkaline Phosphatase  65 38 - 126 U/L   Total Bilirubin 0.5 0.0 - 1.2 mg/dL   GFR, Estimated >16 >10 mL/min    Comment: (NOTE) Calculated using the CKD-EPI Creatinine Equation (2021)    Anion gap 8 5 - 15    Comment: Performed at [facility], [address] [place], [place] [address]  CBC with Differential (Cancer  Center Only)     Status: Abnormal   Collection Time: 04/24/23  2:47 PM  Result Value Ref Range   WBC Count 3.8 (L) 4.0 - 10.5 K/uL   RBC 4.30 3.87 - 5.11 MIL/uL   Hemoglobin 11.8 (L) 12.0 - 15.0 g/dL   HCT 40.9 (L) 81.1 - 91.4 %   MCV 81.9 80.0 - 100.0 fL   MCH 27.4 26.0 - 34.0 pg   MCHC 33.5 30.0 - 36.0 g/dL   RDW 78.2 95.6 - 21.3 %   Platelet Count 217 150 - 400 K/uL   nRBC 0.0 0.0 - 0.2 %   Neutrophils Relative % 63 %   Neutro Abs 2.4 1.7 - 7.7 K/uL   Lymphocytes Relative 24 %   Lymphs Abs 0.9 0.7 - 4.0 K/uL   Monocytes Relative 10 %   Monocytes Absolute 0.4 0.1 - 1.0 K/uL   Eosinophils Relative 2 %   Eosinophils Absolute 0.1 0.0 - 0.5 K/uL   Basophils Relative 1 %   Basophils Absolute 0.0 0.0 - 0.1 K/uL   Immature Granulocytes 0 %   Abs Immature Granulocytes 0.01 0.00 - 0.07 K/uL    Comment: Performed at [facility], [address], [place], [place] [address]        Garner Nash, MD, MS

## 2023-04-29 NOTE — Patient Instructions (Addendum)
VISIT SUMMARY:  During today's visit, we discussed your recent knee injury from a fall, new chest pain, and ongoing breast cancer treatment. We reviewed your symptoms and created a plan to address each issue.  YOUR PLAN:  -KNEE AND SHIN TRAUMA: You have acute pain and swelling in your knee and shin from a fall a week ago. We will get an X-ray of your right knee and shin to check for any fractures. Continue using Tylenol and Aspirin for pain relief. Consider topical lidocaine gel to apply topically for localized pain relief.  -CHEST PAIN: You have new chest pain that started around the same time as your fall. It is difficult to determine the cause due to your existing nerve pain from a recent lumpectomy. Start taking Gabapentin as prescribed by your oncologist for nerve pain. If your chest pain becomes severe, please seek emergency care immediately.  -BREAST CANCER: You are continuing your treatment for breast cancer with Verzenio as prescribed by your oncology team. [name] is a medication used to treat certain types of breast cancer by slowing the growth of cancer cells.  INSTRUCTIONS:  Please follow up with the X-ray of your right knee and shin as soon as possible. Continue taking Gabapentin for nerve pain and monitor your chest pain closely. If you experience severe chest pain, seek emergency care immediately.  For xrays, go to:    [facility] [address] [place] [place], [place], [place] [address] Phone: [phone]

## 2023-05-03 ENCOUNTER — Other Ambulatory Visit (HOSPITAL_COMMUNITY): Payer: Self-pay

## 2023-05-09 ENCOUNTER — Inpatient Hospital Stay: Payer: BC Managed Care – PPO

## 2023-05-09 ENCOUNTER — Inpatient Hospital Stay (HOSPITAL_BASED_OUTPATIENT_CLINIC_OR_DEPARTMENT_OTHER): Payer: Medicaid Other | Attending: Hematology and Oncology | Admitting: Hematology and Oncology

## 2023-05-09 VITALS — BP 140/79 | HR 97 | Temp 97.7°F | Resp 18 | Ht 67.0 in | Wt 340.0 lb

## 2023-05-09 DIAGNOSIS — Z170 Estrogen receptor positive status [ER+]: Secondary | ICD-10-CM | POA: Diagnosis not present

## 2023-05-09 DIAGNOSIS — Z79899 Other long term (current) drug therapy: Secondary | ICD-10-CM | POA: Diagnosis not present

## 2023-05-09 DIAGNOSIS — C50812 Malignant neoplasm of overlapping sites of left female breast: Secondary | ICD-10-CM | POA: Insufficient documentation

## 2023-05-09 DIAGNOSIS — Z7982 Long term (current) use of aspirin: Secondary | ICD-10-CM | POA: Diagnosis not present

## 2023-05-09 DIAGNOSIS — K5900 Constipation, unspecified: Secondary | ICD-10-CM | POA: Diagnosis not present

## 2023-05-09 DIAGNOSIS — Z923 Personal history of irradiation: Secondary | ICD-10-CM | POA: Diagnosis not present

## 2023-05-09 DIAGNOSIS — Z79811 Long term (current) use of aromatase inhibitors: Secondary | ICD-10-CM | POA: Insufficient documentation

## 2023-05-09 DIAGNOSIS — R519 Headache, unspecified: Secondary | ICD-10-CM | POA: Insufficient documentation

## 2023-05-09 DIAGNOSIS — R197 Diarrhea, unspecified: Secondary | ICD-10-CM | POA: Insufficient documentation

## 2023-05-09 DIAGNOSIS — Z9221 Personal history of antineoplastic chemotherapy: Secondary | ICD-10-CM | POA: Diagnosis not present

## 2023-05-09 DIAGNOSIS — R5383 Other fatigue: Secondary | ICD-10-CM | POA: Insufficient documentation

## 2023-05-09 LAB — CBC WITH DIFFERENTIAL (CANCER CENTER ONLY)
Abs Immature Granulocytes: 0 10*3/uL (ref 0.00–0.07)
Basophils Absolute: 0.1 10*3/uL (ref 0.0–0.1)
Basophils Relative: 1 %
Eosinophils Absolute: 0.1 10*3/uL (ref 0.0–0.5)
Eosinophils Relative: 2 %
HCT: 33.3 % — ABNORMAL LOW (ref 36.0–46.0)
Hemoglobin: 11.5 g/dL — ABNORMAL LOW (ref 12.0–15.0)
Immature Granulocytes: 0 %
Lymphocytes Relative: 23 %
Lymphs Abs: 0.9 10*3/uL (ref 0.7–4.0)
MCH: 27.8 pg (ref 26.0–34.0)
MCHC: 34.5 g/dL (ref 30.0–36.0)
MCV: 80.6 fL (ref 80.0–100.0)
Monocytes Absolute: 0.3 10*3/uL (ref 0.1–1.0)
Monocytes Relative: 9 %
Neutro Abs: 2.5 10*3/uL (ref 1.7–7.7)
Neutrophils Relative %: 65 %
Platelet Count: 219 10*3/uL (ref 150–400)
RBC: 4.13 MIL/uL (ref 3.87–5.11)
RDW: 15.2 % (ref 11.5–15.5)
WBC Count: 3.9 10*3/uL — ABNORMAL LOW (ref 4.0–10.5)
nRBC: 0 % (ref 0.0–0.2)

## 2023-05-09 LAB — CMP (CANCER CENTER ONLY)
ALT: 22 U/L (ref 0–44)
AST: 21 U/L (ref 15–41)
Albumin: 4.1 g/dL (ref 3.5–5.0)
Alkaline Phosphatase: 62 U/L (ref 38–126)
Anion gap: 6 (ref 5–15)
BUN: 16 mg/dL (ref 6–20)
CO2: 26 mmol/L (ref 22–32)
Calcium: 9.2 mg/dL (ref 8.9–10.3)
Chloride: 102 mmol/L (ref 98–111)
Creatinine: 0.87 mg/dL (ref 0.44–1.00)
GFR, Estimated: >60 mL/min (ref >60)
Glucose, Bld: 128 mg/dL — ABNORMAL HIGH (ref 70–99)
Potassium: 4.1 mmol/L (ref 3.5–5.1)
Sodium: 134 mmol/L — ABNORMAL LOW (ref 135–145)
Total Bilirubin: 0.6 mg/dL (ref 0.0–1.2)
Total Protein: 7.4 g/dL (ref 6.5–8.1)

## 2023-05-09 MED ORDER — GOSERELIN ACETATE 3.6 MG ~~LOC~~ IMPL
3.6000 mg | DRUG_IMPLANT | Freq: Once | SUBCUTANEOUS | Status: AC
  Administered 2023-05-09: 3.6 mg via SUBCUTANEOUS
  Filled 2023-05-09: qty 3.6

## 2023-05-09 NOTE — Progress Notes (Signed)
 Patient Care Team: Nedra Tinnie LABOR, NP as PCP - General (Internal Medicine) Velma Raisin, MD as Consulting Physician (Family Medicine) Velma Raisin, MD as Consulting Physician (Family Medicine) Odean Potts, MD as Consulting Physician (Hematology and Oncology) Ebbie Cough, MD as Consulting Physician (General Surgery) Dewey Rush, MD as Consulting Physician (Radiation Oncology) Tyree Nanetta SAILOR, RN as Oncology Nurse Navigator Glean Stephane BROCKS, RN as Oncology Nurse Navigator  DIAGNOSIS:  Encounter Diagnosis  Name Primary?   Malignant neoplasm of overlapping sites of left breast in female, estrogen receptor positive (HCC) Yes    SUMMARY OF ONCOLOGIC HISTORY: Oncology History  Malignant neoplasm of overlapping sites of left breast in female, estrogen receptor positive (HCC)  05/29/2022 Initial Diagnosis   Palpable abnormalities in the left breast led to mammograms which revealed 3 masses 0.7 cm at 1 o'clock position, 3 cm at 2 o'clock position, additional nodule 8 mm total span 5 cm, 5 abnormal axillary lymph nodes biopsy of the masses and the lymph node were positive for grade 3 IDC with high-grade DCIS with LVI, ER 95%, PR 40%, Ki-67 60%, HER2 2+ by IHC FISH negative   06/15/2022 Cancer Staging   Staging form: Breast, AJCC 8th Edition - Clinical: Stage IIIB (cT3, cN3, cM0, G3, ER+, PR+, HER2-) - Signed by Crawford Morna Pickle, NP on 06/15/2022 Stage prefix: Initial diagnosis Histologic grading system: 3 grade system   06/15/2022 Breast MRI   IMPRESSION: 1. Multiple sites of biopsy proven malignancy with in the lateral left breast. The overall conglomerate of suspicious findings spans approximately 4.4 x 3.5 x 3.5 cm, and contains all 3 post biopsy marking clips. 2. 10+ morphologically abnormal level I, II, III and Rotter's lymph nodes within the left axilla. 3. Diffuse skin and trabecular thickening on the left, consistent with lymphovascular spread of disease. 4.  No MRI evidence of malignancy on the right.   06/20/2022 - 10/09/2022 Chemotherapy   Patient is on Treatment Plan : BREAST ADJUVANT DOSE DENSE AC q14d / PACLitaxel  q7d      Genetic Testing   [facility] Multi-Cancer Panel+RNA was Negative. Report date is 06/13/2022.  The Multi-Cancer + RNA Panel offered by [facility] includes sequencing and/or deletion/duplication analysis of the following 70 genes:  AIP*, ALK, APC*, ATM*, AXIN2*, BAP1*, BARD1*, BLM*, BMPR1A*, BRCA1*, BRCA2*, BRIP1*, CDC73*, CDH1*, CDK4, CDKN1B*, CDKN2A, CHEK2*, CTNNA1*, DICER1*, EPCAM (del/dup only), EGFR, FH*, FLCN*, GREM1 (promoter dup only), HOXB13, KIT, LZTR1, MAX*, MBD4, MEN1*, MET, MITF, MLH1*, MSH2*, MSH3*, MSH6*, MUTYH*, NF1*, NF2*, NTHL1*, PALB2*, PDGFRA, PMS2*, POLD1*, POLE*, POT1*, PRKAR1A*, PTCH1*, PTEN*, RAD51C*, RAD51D*, RB1*, RET, SDHA* (sequencing only), SDHAF2*, SDHB*, SDHC*, SDHD*, SMAD4*, SMARCA4*, SMARCB1*, SMARCE1*, STK11*, SUFU*, TMEM127*, TP53*, TSC1*, TSC2*, VHL*. RNA analysis is performed for * genes.   01/22/2023 - 03/21/2023 Radiation Therapy   Adjuvant radiation therapy     CHIEF COMPLIANT: Follow-up on Verzenio   HISTORY OF PRESENT ILLNESS: Lynn [name] is 38 year old with above-mentioned history of breast cancer was currently on adjuvant therapy with Verzinio along with anastrozole .  She is tolerating the treatment reasonably well.  She developed a headache that comes on every morning but goes away through the rest of the day.  It is unclear which of the medications is causing it.  Other than that she has intermittent diarrhea and intermittent constipation.     ALLERGIES:  is allergic to macrobid [nitrofurantoin macrocrystal] and ibuprofen.  MEDICATIONS:  Current Outpatient Medications  Medication Sig Dispense Refill   abemaciclib  (VERZENIO ) 100 MG tablet Take 1 tablet (100 mg total) by  mouth 2 (two) times daily. 60 tablet 3   ALPRAZolam  (XANAX ) 0.5 MG tablet TAKE 1 TABLET BY MOUTH AT BEDTIME AS NEEDED FOR  ANXIETY 30 tablet 3   anastrozole  (ARIMIDEX ) 1 MG tablet Take 1 tablet (1 mg total) by mouth daily. 90 tablet 3   aspirin EC 81 MG tablet Take 81 mg by mouth daily. Swallow whole.     aspirin-acetaminophen -caffeine (EXCEDRIN MIGRAINE) 250-250-65 MG tablet Take 1-2 tablets by mouth 2 (two) times daily as needed for headache or migraine.     Blood Glucose Monitoring Suppl (ACCU-CHEK AVIVA PLUS) w/Device KIT 1 each by Does not apply route daily at 6 (six) AM. 1 kit 0   carvedilol  (COREG ) 25 MG tablet TAKE 1 TABLET(25 MG) BY MOUTH TWICE DAILY WITH A MEAL 180 tablet 0   esomeprazole (NEXIUM) 20 MG capsule Take 20 mg by mouth daily at 12 noon.     gabapentin  (NEURONTIN ) 100 MG capsule Take 1 capsule (100 mg total) by mouth 3 (three) times daily. 90 capsule 3   glucose blood (ACCU-CHEK AVIVA PLUS) test strip Use as instructed 100 each 12   lidocaine  (XYLOCAINE ) 2 % jelly Apply 1 Application topically as needed. (Patient not taking: Reported on 04/29/2023) 85 g 0   Menthol, Topical Analgesic, (BIOFREEZE) 10 % CREA Apply 1 Application topically 3 (three) times daily as needed (pain).     ONE TOUCH CLUB LANCETS MISC 1 each by Does not apply route daily. 100 each 11   OZEMPIC , 0.25 OR 0.5 MG/DOSE, 2 MG/3ML SOPN INJECT 0.5 INTO THE SKIN ONCE A WEEK 3 mL 2   prochlorperazine  (COMPAZINE ) 10 MG tablet Take 1 tablet (10 mg total) by mouth every 6 (six) hours as needed for nausea or vomiting. 30 tablet 2   No current facility-administered medications for this visit.    PHYSICAL EXAMINATION: ECOG PERFORMANCE STATUS: 1 - Symptomatic but completely ambulatory  Vitals:   05/09/23 1504  BP: (!) 140/79  Pulse: 97  Resp: 18  Temp: 97.7 F (36.5 C)  SpO2: 100%   Filed Weights   05/09/23 1504  Weight: (!) 340 lb (154.2 kg)      LABORATORY DATA:  I have reviewed the data as listed    Latest Ref Rng & Units 05/09/2023    2:38 PM 04/24/2023    2:47 PM 04/10/2023    9:29 AM  CMP  Glucose 70 - 99 mg/dL 871   854  796   BUN 6 - 20 mg/dL 16  19  15    Creatinine 0.44 - 1.00 mg/dL 9.12  8.99  9.34   Sodium 135 - 145 mmol/L 134  135  134   Potassium 3.5 - 5.1 mmol/L 4.1  4.1  4.3   Chloride 98 - 111 mmol/L 102  101  102   CO2 22 - 32 mmol/L 26  26  25    Calcium 8.9 - 10.3 mg/dL 9.2  9.1  8.6   Total Protein 6.5 - 8.1 g/dL 7.4  7.4  6.7   Total Bilirubin 0.0 - 1.2 mg/dL 0.6  0.5  0.5   Alkaline Phos 38 - 126 U/L 62  65  66   AST 15 - 41 U/L 21  23  17    ALT 0 - 44 U/L 22  25  22      Lab Results  Component Value Date   WBC 3.9 (L) 05/09/2023   HGB 11.5 (L) 05/09/2023   HCT 33.3 (L) 05/09/2023   MCV  80.6 05/09/2023   PLT 219 05/09/2023   NEUTROABS 2.5 05/09/2023    ASSESSMENT & PLAN:  Malignant neoplasm of overlapping sites of left breast in female, 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   06/27/2022: CT CAP: Left breast cancer, 2 left axillary lymph nodes, left adnexal cystic lesion 4 cm    Treatment Plan:  1. Neoadjuvant chemotherapy with Adriamycin  and Cytoxan  dose dense 4 followed by Taxol  weekly 10 completed 10/16/2022 2. 11/20/2022: Left lumpectomy: No residual cancer, margins negative, 0/14 lymph nodes 3. Followed by adjuvant radiation therapy 01/22/2023-03/21/2023 4.  Followed by antiestrogen therapy to start 04/03/2023 ____________________________________________________ Antiestrogen therapy: I recommended ovarian function suppression with Zoladex  along with anastrozole  with Verzenio  (start 04/10/23)X 2 years   Verzinio toxicities: Headaches: I discussed with her about adding anastrozole  for we can see if that makes a difference. Intermittent diarrhea and constipation Fatigue Labs and follow-up in 1 month    No orders of the defined types were placed in this encounter.  The patient has a good understanding of the overall plan. she agrees with it. she will call with any problems  that may develop before the next visit here. Total time spent: 30 mins including face to face time and time spent for planning, charting and co-ordination of care   Naomi MARLA Chad, MD 05/09/23

## 2023-05-09 NOTE — Assessment & Plan Note (Addendum)
 stage IIIB ER/PR positive breast cancer diagnosed in 06/2022 currently receiving neoadjuvant chemotherapy.   06/27/2022: CT CAP: Left breast cancer, 2 left axillary lymph nodes, left adnexal cystic lesion 4 cm    Treatment Plan:  1. Neoadjuvant chemotherapy with Adriamycin  and Cytoxan  dose dense 4 followed by Taxol  weekly 10 completed 10/16/2022 2. 11/20/2022: Left lumpectomy: No residual cancer, margins negative, 0/14 lymph nodes 3. Followed by adjuvant radiation therapy 01/22/2023-03/21/2023 4.  Followed by antiestrogen therapy to start 04/03/2023 ____________________________________________________ Antiestrogen therapy: I recommended ovarian function suppression with Zoladex  along with anastrozole  with Verzenio  (start 04/10/23)X 2 years   Verzinio toxicities: Labs and follow-up in 1 month

## 2023-05-10 ENCOUNTER — Inpatient Hospital Stay: Payer: BC Managed Care – PPO

## 2023-05-13 ENCOUNTER — Telehealth: Payer: Self-pay

## 2023-05-13 NOTE — Telephone Encounter (Signed)
 Pt called inquiring about r/s her SCP visit for 3/6. Advised pt Loris Ros is not available that day. Pt asks if she can come 05/24/23 for SCP. Pt scheduler per [facility] 05/24/23 at 2:00 PM. She verbalized thanks.

## 2023-05-13 NOTE — Telephone Encounter (Signed)
 Lynn [name]

## 2023-05-14 ENCOUNTER — Inpatient Hospital Stay: Payer: BC Managed Care – PPO | Admitting: Hematology and Oncology

## 2023-05-17 ENCOUNTER — Inpatient Hospital Stay: Payer: BC Managed Care – PPO | Admitting: Adult Health

## 2023-05-17 LAB — HM DIABETES EYE EXAM

## 2023-05-20 ENCOUNTER — Encounter: Payer: Self-pay | Admitting: Nurse Practitioner

## 2023-05-21 ENCOUNTER — Other Ambulatory Visit: Payer: Self-pay

## 2023-05-23 NOTE — Therapy (Signed)
 OUTPATIENT PHYSICAL THERAPY SOZO SCREENING NOTE   Patient Name: Lynn [name] MRN: [id number] DOB:25-May-1985, 38 y.o., female Today's Date: 05/27/2023  PCP: Gerre Scull, NP REFERRING PROVIDER: Emelia Loron, MD   PT End of Session - 05/27/23 [phone]     Visit Number 5   unchanged due to screen only   PT Start Time 0840    PT Stop Time 0846    PT Time Calculation (min) 6 min              Past Medical History:  Diagnosis Date   Anemia    Cancer (HCC) 2024   L breast   Carpal tunnel syndrome, left    Depression    Diabetes (HCC)    Dysrhythmia    Palpitations   GERD (gastroesophageal reflux disease)    Hypertension    Iron deficiency    Panic attacks    PCOS (polycystic ovarian syndrome)    Pneumonia    Port-A-Cath in place 07/31/2022   Tachycardia    Umbilical hernia    Past Surgical History:  Procedure Laterality Date   ADENOIDECTOMY     As a child   AXILLARY LYMPH NODE DISSECTION Left 11/20/2022   Procedure: LEFT AXILLARY DISSECTION;  Surgeon: Emelia Loron, MD;  Location: [facility] OR;  Service: General;  Laterality: Left;   BREAST BIOPSY Left 05/29/2022   [place] LT BREAST BX W LOC DEV 1ST LESION IMG BX SPEC US GUIDE 05/29/2022 [facility]   BREAST BIOPSY Left 05/29/2022   [place] LT BREAST BX W LOC DEV EA ADD LESION IMG BX SPEC US GUIDE 05/29/2022 [facility]   BREAST BIOPSY Left 06/06/2022   MM LT BREAST BX W LOC DEV 1ST LESION IMAGE BX SPEC STEREO GUIDE 06/06/2022 [facility]   BREAST BIOPSY  11/19/2022   MM LT RADIOACTIVE SEED LOC MAMMO GUIDE 11/19/2022 [facility]MO[facility]   BREAST BIOPSY  11/19/2022   MM LT RADIOACTIVE SEED EA ADD LESION LOC MAMMO GUIDE 11/19/2022 [facility]   BREAST BIOPSY  11/19/2022   MM LT RADIOACTIVE SEED EA ADD LESION LOC MAMMO GUIDE 11/19/2022 [facility]   BREAST LUMPECTOMY WITH RADIOACTIVE SEED AND AXILLARY LYMPH NODE DISSECTION Left 11/20/2022   Procedure: LEFT BREAST LUMPECTOMY WITH RADIOACTIVE SEED;   Surgeon: Emelia Loron, MD;  Location: [facility] OR;  Service: General;  Laterality: Left;  PEC BLOCK   CESAREAN SECTION  2015   PORT-A-CATH REMOVAL Right 11/20/2022   Procedure: REMOVAL PORT-A-CATH;  Surgeon: Emelia Loron, MD;  Location: [facility] OR;  Service: General;  Laterality: Right;   PORTACATH PLACEMENT N/A 06/19/2022   Procedure: INSERTION PORT-A-CATH;  Surgeon: Emelia Loron, MD;  Location: [facility] OR;  Service: General;  Laterality: N/A;   TONSILLECTOMY     As a child   Patient Active Problem List   Diagnosis Date Noted   Breast cancer (HCC) 11/20/2022   Genetic testing 06/15/2022   Anxiety and depression 06/11/2022   Malignant neoplasm of overlapping sites of left breast in female, estrogen receptor positive (HCC) 06/04/2022   Syncope and collapse 03/12/2022   Umbilical hernia without obstruction and without gangrene 03/12/2022   Vitamin D deficiency 03/12/2022   Cervical radicular pain 12/07/2021   Snoring 12/07/2021   Other fatigue 12/07/2021   Chest pain 08/15/2021   Epigastric pain 08/15/2021   Acute pain of both shoulders 04/21/2021   Nerve pain 04/21/2021   Morbid obesity (HCC) 10/13/2020   Essential hypertension 03/12/2019   Chronic tachycardia 03/12/2019   Generalized anxiety disorder with  panic attacks 03/12/2019   Family history of heart attack 03/12/2019   LVH (left ventricular hypertrophy) due to hypertensive disease, without heart failure 03/12/2019   Type 2 diabetes mellitus (HCC) 03/12/2019    REFERRING DIAG: left breast cancer at risk for lymphedema  THERAPY DIAG: Malignant neoplasm of upper-outer quadrant of left breast in female, estrogen receptor positive (HCC)  PERTINENT HISTORY: Patient was diagnosed on 05/21/2022 with left grade 3 invasive ductal carcinoma breast cancer. It measures 3 cm and 7 mm (5 cm total) and is located in the upper outer quadrant. It is ER/PR positive and HER2 negative with a Ki67 of 60%. She has 5 abnormal appearing axillary  lymph nodes and 1 was biopsied and is positive. She has a BMI > 50, hypertension, and diabetes. Left rotator cuff tear; needs surgery but is now postponed. Pt had neoadjuvant chemotherapy and she is s/p a Left lumpectomy with ALND and 0+/14 LN's. She will have radiation simulation on Sept 17, 2024   PRECAUTIONS: left UE Lymphedema risk, None  SUBJECTIVE: Pt returns for her first 3 month L-Dex screen. "I am having pulling pain in my breast."  PAIN:  Are you having pain? Yes: NPRS scale: 6/10 Pain location: L breast Pain description: pulling Aggravating factors: wearing the bra Relieving factors: nothing  SOZO SCREENING: Patient was assessed today using the SOZO machine to determine the lymphedema index score. This was compared to her baseline score. It was determined that she is within the recommended range when compared to her baseline and no further action is needed at this time. She will continue SOZO screenings. These are done every 3 months for 2 years post operatively followed by every 6 months for 2 years, and then annually.  Message sent to Dr. Pamelia Hoit to request a referral for L breast pain and discomfort.     L-DEX FLOWSHEETS - 05/27/23 0800       L-DEX LYMPHEDEMA SCREENING   Measurement Type Unilateral    L-DEX MEASUREMENT EXTREMITY Upper Extremity    POSITION  Standing    DOMINANT SIDE Right    At Risk Side Left    BASELINE SCORE (UNILATERAL) 1.8    L-DEX SCORE (UNILATERAL) 4    VALUE CHANGE (UNILAT) 2.2              L-DEX FLOWSHEETS - 05/27/23 0800       L-DEX LYMPHEDEMA SCREENING   Measurement Type Unilateral    L-DEX MEASUREMENT EXTREMITY Upper Extremity    POSITION  Standing    DOMINANT SIDE Right    At Risk Side Left    BASELINE SCORE (UNILATERAL) 1.8    L-DEX SCORE (UNILATERAL) 4    VALUE CHANGE (UNILAT) 2.2                [employer], PT 05/27/2023, 8:47 AM

## 2023-05-24 ENCOUNTER — Other Ambulatory Visit: Payer: Self-pay

## 2023-05-24 ENCOUNTER — Inpatient Hospital Stay (HOSPITAL_BASED_OUTPATIENT_CLINIC_OR_DEPARTMENT_OTHER): Payer: BC Managed Care – PPO | Admitting: Adult Health

## 2023-05-24 ENCOUNTER — Encounter: Payer: Self-pay | Admitting: Adult Health

## 2023-05-24 VITALS — BP 137/83 | HR 96 | Temp 97.7°F | Resp 17 | Wt 342.0 lb

## 2023-05-24 DIAGNOSIS — R519 Headache, unspecified: Secondary | ICD-10-CM | POA: Diagnosis not present

## 2023-05-24 DIAGNOSIS — Z170 Estrogen receptor positive status [ER+]: Secondary | ICD-10-CM | POA: Diagnosis not present

## 2023-05-24 DIAGNOSIS — C50812 Malignant neoplasm of overlapping sites of left female breast: Secondary | ICD-10-CM

## 2023-05-24 DIAGNOSIS — Z79811 Long term (current) use of aromatase inhibitors: Secondary | ICD-10-CM | POA: Diagnosis not present

## 2023-05-24 DIAGNOSIS — K5900 Constipation, unspecified: Secondary | ICD-10-CM | POA: Diagnosis not present

## 2023-05-24 DIAGNOSIS — Z7982 Long term (current) use of aspirin: Secondary | ICD-10-CM | POA: Diagnosis not present

## 2023-05-24 DIAGNOSIS — Z79899 Other long term (current) drug therapy: Secondary | ICD-10-CM | POA: Diagnosis not present

## 2023-05-24 DIAGNOSIS — R5383 Other fatigue: Secondary | ICD-10-CM | POA: Diagnosis not present

## 2023-05-24 DIAGNOSIS — R197 Diarrhea, unspecified: Secondary | ICD-10-CM | POA: Diagnosis not present

## 2023-05-24 DIAGNOSIS — Z923 Personal history of irradiation: Secondary | ICD-10-CM | POA: Diagnosis not present

## 2023-05-24 DIAGNOSIS — Z9221 Personal history of antineoplastic chemotherapy: Secondary | ICD-10-CM | POA: Diagnosis not present

## 2023-05-24 NOTE — Progress Notes (Signed)
 SURVIVORSHIP VISIT:  BRIEF ONCOLOGIC HISTORY:  Oncology History  Malignant neoplasm of overlapping sites of left breast in female, estrogen receptor positive (HCC)  05/29/2022 Initial Diagnosis   Palpable abnormalities in the left breast led to mammograms which revealed 3 masses 0.7 cm at 1 o'clock position, 3 cm at 2 o'clock position, additional nodule 8 mm total span 5 cm, 5 abnormal axillary lymph nodes biopsy of the masses and the lymph node were positive for grade 3 IDC with high-grade DCIS with LVI, ER 95%, PR 40%, Ki-67 60%, HER2 2+ by IHC FISH negative   06/15/2022 Cancer Staging   Staging form: Breast, AJCC 8th Edition - Clinical: Stage IIIB (cT3, cN3, cM0, G3, ER+, PR+, HER2-) - Signed by Loa Socks, NP on 06/15/2022 Stage prefix: Initial diagnosis Histologic grading system: 3 grade system   06/15/2022 Breast MRI   IMPRESSION: 1. Multiple sites of biopsy proven malignancy with in the lateral left breast. The overall conglomerate of suspicious findings spans approximately 4.4 x 3.5 x 3.5 cm, and contains all 3 post biopsy marking clips. 2. 10+ morphologically abnormal level I, II, III and Rotter's lymph nodes within the left axilla. 3. Diffuse skin and trabecular thickening on the left, consistent with lymphovascular spread of disease. 4. No MRI evidence of malignancy on the right.   06/20/2022 - 10/09/2022 Chemotherapy   Patient is on Treatment Plan : BREAST ADJUVANT DOSE DENSE AC q14d / PACLitaxel q7d      Genetic Testing   [facility] Multi-Cancer Panel+RNA was Negative. Report date is 06/13/2022.  The Multi-Cancer + RNA Panel offered by [facility] includes sequencing and/or deletion/duplication analysis of the following 70 genes:  AIP*, ALK, APC*, ATM*, AXIN2*, BAP1*, BARD1*, BLM*, BMPR1A*, BRCA1*, BRCA2*, BRIP1*, CDC73*, CDH1*, CDK4, CDKN1B*, CDKN2A, CHEK2*, CTNNA1*, DICER1*, EPCAM (del/dup only), EGFR, FH*, FLCN*, GREM1 (promoter dup only), HOXB13, KIT, LZTR1, MAX*,  MBD4, MEN1*, MET, MITF, MLH1*, MSH2*, MSH3*, MSH6*, MUTYH*, NF1*, NF2*, NTHL1*, PALB2*, PDGFRA, PMS2*, POLD1*, POLE*, POT1*, PRKAR1A*, PTCH1*, PTEN*, RAD51C*, RAD51D*, RB1*, RET, SDHA* (sequencing only), SDHAF2*, SDHB*, SDHC*, SDHD*, SMAD4*, SMARCA4*, SMARCB1*, SMARCE1*, STK11*, SUFU*, TMEM127*, TP53*, TSC1*, TSC2*, VHL*. RNA analysis is performed for * genes.   01/22/2023 - 03/21/2023 Radiation Therapy   Plan Name: Breast_L_BH Site: Breast, Left Technique: 3D Mode: Photon Dose Per Fraction: 1.8 Gy Prescribed Dose (Delivered / Prescribed): 50.4 Gy / 50.4 Gy Prescribed Fxs (Delivered / Prescribed): 28 / 28   Plan Name: Brst_L_SCV_BH Site: Sclav-LT Technique: 3D Mode: Photon Dose Per Fraction: 1.8 Gy Prescribed Dose (Delivered / Prescribed): 50.4 Gy / 50.4 Gy Prescribed Fxs (Delivered / Prescribed): 28 / 28   Plan Name: Brst_L_BH_Bst Site: Breast, Left Technique: 3D Mode: Photon Dose Per Fraction: 2 Gy Prescribed Dose (Delivered / Prescribed): 10 Gy / 10 Gy Prescribed Fxs (Delivered / Prescribed): 5 / 5   04/2023 -  Anti-estrogen oral therapy   Anastrozole     INTERVAL HISTORY:    Discussed the use of AI scribe software for clinical note transcription with the patient, who gave verbal consent to proceed.  Lynn [name], a breast cancer survivor, presents with severe headaches that have been occurring since she stopped taking anastrozole. The headaches are described as more intense than any she has experienced before, often waking her up at night and occurring first thing in the morning. The headaches are primarily located at the base of the neck and the top of the head. The patient reports that the headaches seem to improve with Excedrin, but she is aware that  she cannot rely on this medication daily.  The patient also reports experiencing significant anxiety, which has been exacerbated by recent family issues and fear of cancer recurrence. She has joined a support group to help manage  this anxiety. The patient has been taking Verzenio and Zoladex for her breast cancer, and reports constipation and occasional diarrhea as side effects. She is considering a hysterectomy and removal of ovaries to suppress estrogen production.   REVIEW OF SYSTEMS:  Review of Systems  Constitutional:  Negative for appetite change, chills, fatigue, fever and unexpected weight change.  HENT:   Negative for hearing loss, lump/mass and trouble swallowing.   Eyes:  Negative for eye problems and icterus.  Respiratory:  Negative for chest tightness, cough and shortness of breath.   Cardiovascular:  Negative for chest pain, leg swelling and palpitations.  Gastrointestinal:  Negative for abdominal distention, abdominal pain, constipation, diarrhea, nausea and vomiting.  Endocrine: Negative for hot flashes.  Genitourinary:  Negative for difficulty urinating.   Musculoskeletal:  Negative for arthralgias.  Skin:  Negative for itching and rash.  Neurological:  Positive for headaches. Negative for dizziness, extremity weakness and numbness.  Hematological:  Negative for adenopathy. Does not bruise/bleed easily.  Psychiatric/Behavioral:  Negative for depression. The patient is not nervous/anxious.   Breast: Denies any new nodularity, masses, tenderness, nipple changes, or nipple discharge.       PAST MEDICAL/SURGICAL HISTORY:  Past Medical History:  Diagnosis Date   Anemia    Cancer (HCC) 2024   L breast   Carpal tunnel syndrome, left    Depression    Diabetes (HCC)    Dysrhythmia    Palpitations   GERD (gastroesophageal reflux disease)    Hypertension    Iron deficiency    Panic attacks    PCOS (polycystic ovarian syndrome)    Pneumonia    Tachycardia    Umbilical hernia    Past Surgical History:  Procedure Laterality Date   ADENOIDECTOMY     As a child   AXILLARY LYMPH NODE DISSECTION Left 11/20/2022   Procedure: LEFT AXILLARY DISSECTION;  Surgeon: Emelia Loron, MD;  Location: [facility]  OR;  Service: General;  Laterality: Left;   BREAST BIOPSY Left 05/29/2022   [place] LT BREAST BX W LOC DEV 1ST LESION IMG BX SPEC US GUIDE 05/29/2022 [facility]   BREAST BIOPSY Left 05/29/2022   [place] LT BREAST BX W LOC DEV EA ADD LESION IMG BX SPEC US GUIDE 05/29/2022 [facility]   BREAST BIOPSY Left 06/06/2022   MM LT BREAST BX W LOC DEV 1ST LESION IMAGE BX SPEC STEREO GUIDE 06/06/2022 [facility]   BREAST BIOPSY  11/19/2022   MM LT RADIOACTIVE SEED LOC MAMMO GUIDE 11/19/2022 [facility]   BREAST BIOPSY  11/19/2022   MM LT RADIOACTIVE SEED EA ADD LESION LOC MAMMO GUIDE 11/19/2022 [facility]   BREAST BIOPSY  11/19/2022   MM LT RADIOACTIVE SEED EA ADD LESION LOC MAMMO GUIDE 11/19/2022 [facility]   BREAST LUMPECTOMY WITH RADIOACTIVE SEED AND AXILLARY LYMPH NODE DISSECTION Left 11/20/2022   Procedure: LEFT BREAST LUMPECTOMY WITH RADIOACTIVE SEED;  Surgeon: Emelia Loron, MD;  [facility];  Service: General;  Laterality: Left;  PEC BLOCK   CESAREAN SECTION  2015   PORT-A-CATH REMOVAL Right 11/20/2022   Procedure: REMOVAL PORT-A-CATH;  Surgeon: Emelia Loron, MD;  Location: [facility] OR;  Service: General;  Laterality: Right;   PORTACATH PLACEMENT N/A 06/19/2022   Procedure: INSERTION PORT-A-CATH;  Surgeon: Emelia Loron, MD;  Location: [facility] OR;  Service: General;  Laterality: N/A;   TONSILLECTOMY     As a child     ALLERGIES:  Allergies  Allergen Reactions   Macrobid [Nitrofurantoin Macrocrystal] Shortness Of Breath and Nausea And Vomiting   Ibuprofen Hives     CURRENT MEDICATIONS:  Outpatient Encounter Medications as of 05/24/2023  Medication Sig Note   abemaciclib (VERZENIO) 100 MG tablet Take 1 tablet (100 mg total) by mouth 2 (two) times daily. 03/11/2023: Pt will start in Jan 25   ALPRAZolam (XANAX) 0.5 MG tablet TAKE 1 TABLET BY MOUTH AT BEDTIME AS NEEDED FOR ANXIETY    aspirin EC 81 MG tablet Take 81 mg by mouth daily. Swallow whole.     aspirin-acetaminophen-caffeine (EXCEDRIN MIGRAINE) 250-250-65 MG tablet Take 1-2 tablets by mouth 2 (two) times daily as needed for headache or migraine.    Blood Glucose Monitoring Suppl (ACCU-CHEK AVIVA PLUS) w/Device KIT 1 each by Does not apply route daily at 6 (six) AM.    carvedilol (COREG) 25 MG tablet TAKE 1 TABLET(25 MG) BY MOUTH TWICE DAILY WITH A MEAL    esomeprazole (NEXIUM) 20 MG capsule Take 20 mg by mouth daily at 12 noon.    gabapentin (NEURONTIN) 100 MG capsule Take 1 capsule (100 mg total) by mouth 3 (three) times daily.    glucose blood (ACCU-CHEK AVIVA PLUS) test strip Use as instructed    lidocaine (XYLOCAINE) 2 % jelly Apply 1 Application topically as needed.    Menthol, Topical Analgesic, (BIOFREEZE) 10 % CREA Apply 1 Application topically 3 (three) times daily as needed (pain).    ONE TOUCH CLUB LANCETS MISC 1 each by Does not apply route daily.    OZEMPIC, 0.25 OR 0.5 MG/DOSE, 2 MG/3ML SOPN INJECT 0.5 INTO THE SKIN ONCE A WEEK    prochlorperazine (COMPAZINE) 10 MG tablet Take 1 tablet (10 mg total) by mouth every 6 (six) hours as needed for nausea or vomiting.    [DISCONTINUED] anastrozole (ARIMIDEX) 1 MG tablet Take 1 tablet (1 mg total) by mouth daily. (Patient not taking: Reported on 05/24/2023) 03/11/2023: Starts in Jan 2025   No facility-administered encounter medications on file as of 05/24/2023.     ONCOLOGIC FAMILY HISTORY:  Family History  Problem Relation Age of Onset   Heart disease Mother    Diabetes Mother    Hypertension Mother    Kidney disease Mother    Heart failure Mother    Heart attack Mother 60   Arthritis Mother    Asthma Mother    COPD Mother    Depression Mother    Hyperlipidemia Mother    Mental illness Mother    Bladder Cancer Mother    Heart disease Father    Heart attack Father 67   COPD Father    Hyperlipidemia Father    Hypertension Father    Mental illness Father    Arthritis Sister    Depression Sister    Diabetes Sister     Hypertension Sister    Mental illness Sister    Miscarriages / [place] Sister    Alcohol abuse Sister    Depression Sister    Diabetes Sister    Heart disease Sister    Heart attack Sister 43   Arthritis Sister    Kidney disease Sister    Transient ischemic attack Sister    Bladder Cancer Maternal Aunt    Colon cancer Maternal Aunt 50 - 59   Other Maternal Aunt 50 - 30  brain tumor (unknown if benign or malignant)   Lung cancer Paternal Aunt 40   Melanoma Paternal Aunt 40 - 49   Heart failure Maternal Grandmother    Arthritis Maternal Grandmother    Alcohol abuse Maternal Grandmother    COPD Maternal Grandmother    Diabetes Maternal Grandmother    Hearing loss Maternal Grandmother    Heart disease Maternal Grandmother    Hyperlipidemia Maternal Grandmother    Hypertension Maternal Grandmother    Stroke Maternal Grandmother    Miscarriages / Stillbirths Maternal Grandmother    Mental illness Maternal Grandmother    Heart attack Maternal Grandmother    Heart failure Maternal Grandfather    Alcohol abuse Maternal Grandfather    Arthritis Maternal Grandfather    Early death Maternal Grandfather    Heart disease Maternal Grandfather    Hyperlipidemia Maternal Grandfather    Hypertension Maternal Grandfather    Heart attack Maternal Grandfather 60   Mental illness Maternal Grandfather    Heart failure Paternal Grandmother    Arthritis Paternal Grandmother    Diabetes Paternal Grandmother    Heart attack Paternal Grandmother    Miscarriages / Stillbirths Paternal Grandmother    Mental illness Paternal Grandmother    Kidney disease Paternal Grandmother    Intellectual disability Paternal Grandmother    Hypertension Paternal Grandmother    Hyperlipidemia Paternal Grandmother    Heart disease Paternal Grandmother    Heart failure Paternal Grandfather    Arthritis Paternal Grandfather    Heart attack Paternal Grandfather    Mental illness Paternal Grandfather     Hearing loss Paternal Grandfather    Heart disease Paternal Grandfather    Hyperlipidemia Paternal Grandfather    Hypertension Paternal Grandfather      SOCIAL HISTORY:  Social History   Socioeconomic History   Marital status: Significant Other    Spouse name: Not on file   Number of children: 1   Years of education: some college   Highest education level: Some college, no degree  Occupational History   Not on file  Tobacco Use   Smoking status: Former    Current packs/day: 0.00    Average packs/day: 1 pack/day for 7.0 years (7.0 ttl pk-yrs)    Types: Cigarettes, E-cigarettes    Start date: 03/20/2007    Quit date: 10/29/2021    Years since quitting: 1.5   Smokeless tobacco: Never  Vaping Use   Vaping status: Former   Quit date: 04/03/2019  Substance and Sexual Activity   Alcohol use: Not Currently    Comment: rarely   Drug use: Yes    Types: Marijuana   Sexual activity: Yes    Birth control/protection: Condom  Other Topics Concern   Not on file  Social History Narrative      Social Drivers of Health   Financial Resource Strain: Medium Risk (04/29/2023)   Overall Financial Resource Strain (CARDIA)    Difficulty of Paying Living Expenses: Somewhat hard  Food Insecurity: Food Insecurity Present (04/29/2023)   Hunger Vital Sign    Worried About Running Out of Food in the Last Year: Sometimes true    Ran Out of Food in the Last Year: Sometimes true  Transportation Needs: No Transportation Needs (04/29/2023)   PRAPARE - Administrator, Civil Service (Medical): No    Lack of Transportation (Non-Medical): No  Physical Activity: Sufficiently Active (04/29/2023)   Exercise Vital Sign    Days of Exercise per Week: 5 days    Minutes  of Exercise per Session: 30 min  Stress: Stress Concern Present (04/29/2023)   Lynn [name] Occupational [employer] - Occupational Stress Questionnaire    Feeling of Stress : To some extent  Social Connections: Socially Integrated  (04/29/2023)   Social Connection and Isolation Panel [NHANES]    Frequency of Communication with Friends and Family: More than three times a week    Frequency of Social Gatherings with Friends and Family: Once a week    Attends Religious Services: More than 4 times per year    Active Member of [employer] or Organizations: Yes    Attends Engineer, structural: More than 4 times per year    Marital Status: Married  Catering manager Violence: Not At Risk (11/20/2022)   Humiliation, Afraid, Rape, and Kick questionnaire    Fear of Current or Ex-Partner: No    Emotionally Abused: No    Physically Abused: No    Sexually Abused: No     OBSERVATIONS/OBJECTIVE:  BP 137/83 (BP Location: Right Wrist, Patient Position: Sitting)   Pulse 96   Temp 97.7 F (36.5 C) (Temporal)   Resp 17   Wt (!) 342 lb (155.1 kg)   SpO2 97%   BMI 53.56 kg/m  GENERAL: Patient is a well appearing female in no acute distress HEENT:  Sclerae anicteric.  Oropharynx clear and moist. No ulcerations or evidence of oropharyngeal candidiasis. Neck is supple.  NODES:  No cervical, supraclavicular, or axillary lymphadenopathy palpated.  BREAST EXAM:  Left breast s/p lumpectomy and radiation, no sign of local recurrence, right breast benign LUNGS:  Clear to auscultation bilaterally.  No wheezes or rhonchi. HEART:  Regular rate and rhythm. No murmur appreciated. ABDOMEN:  Soft, nontender.  Positive, normoactive bowel sounds. No organomegaly palpated. MSK:  No focal spinal tenderness to palpation. Full range of motion bilaterally in the upper extremities. EXTREMITIES:  No peripheral edema.   SKIN:  Clear with no obvious rashes or skin changes. No nail dyscrasia. NEURO:  Nonfocal. Well oriented.  Appropriate affect.   LABORATORY DATA:  None for this visit.  DIAGNOSTIC IMAGING:  None for this visit.      ASSESSMENT AND PLAN:  Lynn [name] is a pleasant 38 y.o. 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  She presents to the Survivorship Clinic for our initial meeting and routine follow-up post-completion of treatment for breast cancer.    1. Stage IIIB left breast cancer:  Lynn [name] is continuing to recover from definitive treatment for breast cancer. She will follow-up with Dr. Pamelia Hoit 06/06/2023 with history and physical exam per surveillance protocol.  See #2.  Her mammogram is due 05/2023.  Today, a comprehensive survivorship care plan and treatment summary was reviewed with the patient today detailing her breast cancer diagnosis, treatment course, potential late/long-term effects of treatment, appropriate follow-up care with recommendations for the future, and patient education resources.  A copy of this summary, along with a letter will be sent to the patient's primary care provider via mail/fax/In Basket message after today's visit.    2. Headaches: Patient is a breast cancer survivor currently on Zoladex and Verzenio with good tolerance. She has been experiencing severe headaches, possibly related to hormonal changes or anastrozole, which she has recently stopped. She also has a history of anxiety, which has been exacerbated by recent family stressors and  health concerns. -Order MRI to evaluate cause of severe headaches. -Continue Verzenio. -Consider alternative to anastrozole at follow-up visit with Dr. Ree Shay on March 6th.  3. Bone health:  Given Lynn [name]'s age/history of breast cancer and her current treatment regimen including anti-estrogen therapy with Anastrozole, she is at risk for bone demineralization. Oder bone density testing.  She was given education on specific activities to promote bone health.  4. Cancer screening:  Due to Lynn [name]'s history and her age, she should  receive screening for skin cancers, colon cancer (at age 61), and gynecologic cancers.  The information and recommendations are listed on the patient's comprehensive care plan/treatment summary and were reviewed in detail with the patient.    5. Health maintenance and wellness promotion: Lynn [name] was encouraged to consume 5-7 servings of fruits and vegetables per day. We reviewed the "Nutrition Rainbow" handout.  She was also encouraged to engage in moderate to vigorous exercise for 30 minutes per day most days of the week.  She was instructed to limit her alcohol consumption and continue to abstain from tobacco use.     6. Support services/counseling: It is not uncommon for this period of the patient's cancer care trajectory to be one of many emotions and stressors.   She was given information regarding our available services and encouraged to contact me with any questions or for help enrolling in any of our support group/programs.    Follow up instructions:    -Return to cancer center in 6 months for f/u   -Mammogram due in 05/2023 -brain MRI -She is welcome to return back to the Survivorship Clinic at any time; no additional follow-up needed at this time.  -Consider referral back to survivorship as a long-term survivor for continued surveillance  The patient was provided an opportunity to ask questions and all were answered. The patient agreed with the plan and demonstrated an understanding of the instructions.   Total encounter time:45 minutes*in face-to-face visit time, chart review, lab review, care coordination, order entry, and documentation of the encounter time.    Lillard Anes, NP 05/24/23 2:46 PM Medical Oncology and Hematology [facility] [address] [place], [place] [address] Tel. [phone]    Fax. [phone]  *Total Encounter Time as defined by the Centers for Medicare and Medicaid Services includes, in addition to the face-to-face time of a patient  visit (documented in the note above) non-face-to-face time: obtaining and reviewing outside history, ordering and reviewing medications, tests or procedures, care coordination (communications with other health care professionals or caregivers) and documentation in the medical record.

## 2023-05-25 ENCOUNTER — Telehealth: Payer: Self-pay | Admitting: Adult Health

## 2023-05-25 NOTE — Telephone Encounter (Signed)
 Rescheduled and scheduled appointments per 2/21 los. Left VM with appointment details for those made and rescheduled.

## 2023-05-27 ENCOUNTER — Other Ambulatory Visit (HOSPITAL_COMMUNITY): Payer: Self-pay

## 2023-05-27 ENCOUNTER — Encounter: Payer: Self-pay | Admitting: *Deleted

## 2023-05-27 ENCOUNTER — Other Ambulatory Visit: Payer: Self-pay | Admitting: *Deleted

## 2023-05-27 ENCOUNTER — Ambulatory Visit: Payer: BC Managed Care – PPO | Attending: General Surgery | Admitting: Physical Therapy

## 2023-05-27 DIAGNOSIS — Z170 Estrogen receptor positive status [ER+]: Secondary | ICD-10-CM | POA: Insufficient documentation

## 2023-05-27 DIAGNOSIS — C50812 Malignant neoplasm of overlapping sites of left female breast: Secondary | ICD-10-CM | POA: Insufficient documentation

## 2023-05-27 DIAGNOSIS — C50412 Malignant neoplasm of upper-outer quadrant of left female breast: Secondary | ICD-10-CM | POA: Insufficient documentation

## 2023-05-28 ENCOUNTER — Encounter: Payer: Self-pay | Admitting: Hematology and Oncology

## 2023-05-28 ENCOUNTER — Other Ambulatory Visit: Payer: Self-pay

## 2023-05-28 ENCOUNTER — Telehealth: Payer: Self-pay

## 2023-05-28 ENCOUNTER — Other Ambulatory Visit: Payer: BC Managed Care – PPO

## 2023-05-28 NOTE — Telephone Encounter (Signed)
 Spoke with Lynn [name] regarding her referral to GYN oncology. She has an appointment scheduled with Dr. Alvester Morin on 4/14 at 9:00. Patient agrees to date and time. She has been provided with office address and location. She is also aware of our mask and visitor policy. Patient verbalized understanding and will call with any questions.

## 2023-05-28 NOTE — Progress Notes (Signed)
 Specialty Pharmacy Refill Coordination Note  Lynn [name] is a 38 y.o. female contacted today regarding refills of specialty medication(s) Abemaciclib [name])   Patient requested (Patient-Rptd) Pickup at [facility] at [facility] date: (Patient-Rptd) 05/30/23   Medication will be filled on 02.26.25.

## 2023-05-29 ENCOUNTER — Other Ambulatory Visit: Payer: Self-pay

## 2023-05-29 ENCOUNTER — Ambulatory Visit
Admission: RE | Admit: 2023-05-29 | Discharge: 2023-05-29 | Disposition: A | Discharge status: Home / Self Care | Payer: BC Managed Care – PPO | Source: Ambulatory Visit | Attending: Adult Health | Admitting: Adult Health

## 2023-05-29 DIAGNOSIS — C50812 Malignant neoplasm of overlapping sites of left female breast: Secondary | ICD-10-CM

## 2023-05-29 DIAGNOSIS — Z853 Personal history of malignant neoplasm of breast: Secondary | ICD-10-CM | POA: Diagnosis not present

## 2023-05-29 HISTORY — DX: Personal history of antineoplastic chemotherapy: Z92.21

## 2023-06-06 ENCOUNTER — Inpatient Hospital Stay: Payer: BC Managed Care – PPO

## 2023-06-06 ENCOUNTER — Other Ambulatory Visit (HOSPITAL_COMMUNITY): Payer: Self-pay | Admitting: Hematology and Oncology

## 2023-06-06 ENCOUNTER — Telehealth: Payer: Self-pay | Admitting: Hematology and Oncology

## 2023-06-06 ENCOUNTER — Ambulatory Visit (HOSPITAL_COMMUNITY)
Admission: RE | Admit: 2023-06-06 | Discharge: 2023-06-06 | Disposition: A | Discharge status: Home / Self Care | Source: Ambulatory Visit | Attending: Adult Health | Admitting: Adult Health

## 2023-06-06 ENCOUNTER — Inpatient Hospital Stay: Payer: BC Managed Care – PPO | Admitting: Hematology and Oncology

## 2023-06-06 ENCOUNTER — Inpatient Hospital Stay: Payer: BC Managed Care – PPO | Attending: Hematology and Oncology

## 2023-06-06 ENCOUNTER — Other Ambulatory Visit: Payer: Self-pay | Admitting: *Deleted

## 2023-06-06 VITALS — BP 154/74 | HR 105 | Temp 97.9°F | Resp 18 | Ht 67.0 in | Wt 339.6 lb

## 2023-06-06 DIAGNOSIS — C50912 Malignant neoplasm of unspecified site of left female breast: Secondary | ICD-10-CM | POA: Diagnosis not present

## 2023-06-06 DIAGNOSIS — F40240 Claustrophobia: Secondary | ICD-10-CM

## 2023-06-06 DIAGNOSIS — Z79811 Long term (current) use of aromatase inhibitors: Secondary | ICD-10-CM | POA: Diagnosis not present

## 2023-06-06 DIAGNOSIS — E1165 Type 2 diabetes mellitus with hyperglycemia: Secondary | ICD-10-CM | POA: Diagnosis not present

## 2023-06-06 DIAGNOSIS — Z5111 Encounter for antineoplastic chemotherapy: Secondary | ICD-10-CM | POA: Diagnosis not present

## 2023-06-06 DIAGNOSIS — Z79899 Other long term (current) drug therapy: Secondary | ICD-10-CM | POA: Insufficient documentation

## 2023-06-06 DIAGNOSIS — Z170 Estrogen receptor positive status [ER+]: Secondary | ICD-10-CM

## 2023-06-06 DIAGNOSIS — Z7982 Long term (current) use of aspirin: Secondary | ICD-10-CM | POA: Diagnosis not present

## 2023-06-06 DIAGNOSIS — C50812 Malignant neoplasm of overlapping sites of left female breast: Secondary | ICD-10-CM | POA: Insufficient documentation

## 2023-06-06 DIAGNOSIS — R519 Headache, unspecified: Secondary | ICD-10-CM | POA: Diagnosis not present

## 2023-06-06 DIAGNOSIS — Z923 Personal history of irradiation: Secondary | ICD-10-CM | POA: Diagnosis not present

## 2023-06-06 DIAGNOSIS — Z9221 Personal history of antineoplastic chemotherapy: Secondary | ICD-10-CM | POA: Insufficient documentation

## 2023-06-06 DIAGNOSIS — R197 Diarrhea, unspecified: Secondary | ICD-10-CM | POA: Diagnosis not present

## 2023-06-06 LAB — CBC WITH DIFFERENTIAL (CANCER CENTER ONLY)
Abs Immature Granulocytes: 0.01 10*3/uL (ref 0.00–0.07)
Basophils Absolute: 0.1 10*3/uL (ref 0.0–0.1)
Basophils Relative: 2 %
Eosinophils Absolute: 0.1 10*3/uL (ref 0.0–0.5)
Eosinophils Relative: 2 %
HCT: 29.7 % — ABNORMAL LOW (ref 36.0–46.0)
Hemoglobin: 10.3 g/dL — ABNORMAL LOW (ref 12.0–15.0)
Immature Granulocytes: 0 %
Lymphocytes Relative: 15 %
Lymphs Abs: 0.6 10*3/uL — ABNORMAL LOW (ref 0.7–4.0)
MCH: 28.8 pg (ref 26.0–34.0)
MCHC: 34.7 g/dL (ref 30.0–36.0)
MCV: 83 fL (ref 80.0–100.0)
Monocytes Absolute: 0.4 10*3/uL (ref 0.1–1.0)
Monocytes Relative: 10 %
Neutro Abs: 2.9 10*3/uL (ref 1.7–7.7)
Neutrophils Relative %: 71 %
Platelet Count: 176 10*3/uL (ref 150–400)
RBC: 3.58 MIL/uL — ABNORMAL LOW (ref 3.87–5.11)
RDW: 15.5 % (ref 11.5–15.5)
WBC Count: 4 10*3/uL (ref 4.0–10.5)
nRBC: 0 % (ref 0.0–0.2)

## 2023-06-06 LAB — CMP (CANCER CENTER ONLY)
ALT: 22 U/L (ref 0–44)
AST: 19 U/L (ref 15–41)
Albumin: 3.8 g/dL (ref 3.5–5.0)
Alkaline Phosphatase: 56 U/L (ref 38–126)
Anion gap: 4 — ABNORMAL LOW (ref 5–15)
BUN: 13 mg/dL (ref 6–20)
CO2: 29 mmol/L (ref 22–32)
Calcium: 8.8 mg/dL — ABNORMAL LOW (ref 8.9–10.3)
Chloride: 103 mmol/L (ref 98–111)
Creatinine: 0.81 mg/dL (ref 0.44–1.00)
GFR, Estimated: >60 mL/min (ref >60)
Glucose, Bld: 176 mg/dL — ABNORMAL HIGH (ref 70–99)
Potassium: 4.4 mmol/L (ref 3.5–5.1)
Sodium: 136 mmol/L (ref 135–145)
Total Bilirubin: 0.6 mg/dL (ref 0.0–1.2)
Total Protein: 6.9 g/dL (ref 6.5–8.1)

## 2023-06-06 MED ORDER — LORAZEPAM 1 MG PO TABS
1.0000 mg | ORAL_TABLET | Freq: Once | ORAL | Status: AC
  Administered 2023-06-06: 1 mg via ORAL
  Filled 2023-06-06: qty 1

## 2023-06-06 MED ORDER — LETROZOLE 2.5 MG PO TABS: 2.5000 mg | ORAL_TABLET | Freq: Every day | ORAL | 3 refills | Status: AC

## 2023-06-06 MED ORDER — GOSERELIN ACETATE 3.6 MG ~~LOC~~ IMPL
3.6000 mg | DRUG_IMPLANT | Freq: Once | SUBCUTANEOUS | Status: AC
  Administered 2023-06-06: 3.6 mg via SUBCUTANEOUS
  Filled 2023-06-06: qty 3.6

## 2023-06-06 MED ORDER — GADOBUTROL 1 MMOL/ML IV SOLN
10.0000 mL | Freq: Once | INTRAVENOUS | Status: AC | PRN
  Administered 2023-06-06: 10 mL via INTRAVENOUS

## 2023-06-06 NOTE — Progress Notes (Signed)
 Pt, here today for Zoladex injection.  She will have a scan today.  She's claustrophilia, therefore will receive Ativan.  Kelsey/RN Dr. Earmon Phoenix nurse will administer the injection

## 2023-06-06 NOTE — Progress Notes (Signed)
 Patient Care Team: Gerre Scull, NP as PCP - General (Internal Medicine) Serena Croissant, MD as Consulting Physician (Hematology and Oncology) Emelia Loron, MD as Consulting Physician (General Surgery) Dorothy Puffer, MD as Consulting Physician (Radiation Oncology)  DIAGNOSIS:  Encounter Diagnosis  Name Primary?   Malignant neoplasm of overlapping sites of left breast in female, estrogen receptor positive (HCC) Yes    SUMMARY OF ONCOLOGIC HISTORY: Oncology History  Malignant neoplasm of overlapping sites of left breast in female, estrogen receptor positive (HCC)  05/29/2022 Initial Diagnosis   Palpable abnormalities in the left breast led to mammograms which revealed 3 masses 0.7 cm at 1 o'clock position, 3 cm at 2 o'clock position, additional nodule 8 mm total span 5 cm, 5 abnormal axillary lymph nodes biopsy of the masses and the lymph node were positive for grade 3 IDC with high-grade DCIS with LVI, ER 95%, PR 40%, Ki-67 60%, HER2 2+ by IHC FISH negative   06/15/2022 Cancer Staging   Staging form: Breast, AJCC 8th Edition - Clinical: Stage IIIB (cT3, cN3, cM0, G3, ER+, PR+, HER2-) - Signed by Loa Socks, NP on 06/15/2022 Stage prefix: Initial diagnosis Histologic grading system: 3 grade system   06/15/2022 Breast MRI   IMPRESSION: 1. Multiple sites of biopsy proven malignancy with in the lateral left breast. The overall conglomerate of suspicious findings spans approximately 4.4 x 3.5 x 3.5 cm, and contains all 3 post biopsy marking clips. 2. 10+ morphologically abnormal level I, II, III and Rotter's lymph nodes within the left axilla. 3. Diffuse skin and trabecular thickening on the left, consistent with lymphovascular spread of disease. 4. No MRI evidence of malignancy on the right.   06/20/2022 - 10/09/2022 Chemotherapy   Patient is on Treatment Plan : BREAST ADJUVANT DOSE DENSE AC q14d / PACLitaxel q7d      Genetic Testing   [facility] Multi-Cancer  Panel+RNA was Negative. Report date is 06/13/2022.  The Multi-Cancer + RNA Panel offered by [facility] includes sequencing and/or deletion/duplication analysis of the following 70 genes:  AIP*, ALK, APC*, ATM*, AXIN2*, BAP1*, BARD1*, BLM*, BMPR1A*, BRCA1*, BRCA2*, BRIP1*, CDC73*, CDH1*, CDK4, CDKN1B*, CDKN2A, CHEK2*, CTNNA1*, DICER1*, EPCAM (del/dup only), EGFR, FH*, FLCN*, GREM1 (promoter dup only), HOXB13, KIT, LZTR1, MAX*, MBD4, MEN1*, MET, MITF, MLH1*, MSH2*, MSH3*, MSH6*, MUTYH*, NF1*, NF2*, NTHL1*, PALB2*, PDGFRA, PMS2*, POLD1*, POLE*, POT1*, PRKAR1A*, PTCH1*, PTEN*, RAD51C*, RAD51D*, RB1*, RET, SDHA* (sequencing only), SDHAF2*, SDHB*, SDHC*, SDHD*, SMAD4*, SMARCA4*, SMARCB1*, SMARCE1*, STK11*, SUFU*, TMEM127*, TP53*, TSC1*, TSC2*, VHL*. RNA analysis is performed for * genes.   11/20/2022 Surgery   A. BREAST, LEFT, LUMPECTOMY:  Fibrosis and slight inflammation consistent with treatment effects  No residual invasive or in situ carcinoma (ypT0)  Margins: All margins negative for carcinoma  Lymphovascular space invasion: Not identified  Biopsy site and biopsy clip  See oncology table   14 LN biopsied negative for cancer.    01/22/2023 - 03/21/2023 Radiation Therapy   Plan Name: Breast_L_BH Site: Breast, Left Technique: 3D Mode: Photon Dose Per Fraction: 1.8 Gy Prescribed Dose (Delivered / Prescribed): 50.4 Gy / 50.4 Gy Prescribed Fxs (Delivered / Prescribed): 28 / 28   Plan Name: Brst_L_SCV_BH Site: Sclav-LT Technique: 3D Mode: Photon Dose Per Fraction: 1.8 Gy Prescribed Dose (Delivered / Prescribed): 50.4 Gy / 50.4 Gy Prescribed Fxs (Delivered / Prescribed): 28 / 28   Plan Name: Brst_L_BH_Bst Site: Breast, Left Technique: 3D Mode: Photon Dose Per Fraction: 2 Gy Prescribed Dose (Delivered / Prescribed): 10 Gy / 10 Gy Prescribed Fxs (  Delivered / Prescribed): 5 / 5   04/2023 -  Anti-estrogen oral therapy   Anastrozole     CHIEF COMPLIANT: Complains of headaches, patient lost  her sister and is very sad.  Intermittent diarrhea  HISTORY OF PRESENT ILLNESS:   History of Present Illness The patient, with a history of breast cancer, presents for ongoing management. She reports intermittent diarrhea, which she attributes to dietary changes. She also reports a recent fall, resulting in a bruised knee. The patient has been experiencing headaches, which have improved somewhat since discontinuing anastrozole. She also reports fatigue, which may be related to recent bereavement following the death of her sister. The patient has been managing her grief and has been considering getting a tattoo in memory of her sister.     ALLERGIES:  is allergic to macrobid [nitrofurantoin macrocrystal] and ibuprofen.  MEDICATIONS:  Current Outpatient Medications  Medication Sig Dispense Refill   abemaciclib (VERZENIO) 100 MG tablet Take 1 tablet (100 mg total) by mouth 2 (two) times daily. 60 tablet 3   ALPRAZolam (XANAX) 0.5 MG tablet TAKE 1 TABLET BY MOUTH AT BEDTIME AS NEEDED FOR ANXIETY 30 tablet 3   aspirin-acetaminophen-caffeine (EXCEDRIN MIGRAINE) 250-250-65 MG tablet Take 1-2 tablets by mouth 2 (two) times daily as needed for headache or migraine.     Blood Glucose Monitoring Suppl (ACCU-CHEK AVIVA PLUS) w/Device KIT 1 each by Does not apply route daily at 6 (six) AM. 1 kit 0   carvedilol (COREG) 25 MG tablet TAKE 1 TABLET(25 MG) BY MOUTH TWICE DAILY WITH A MEAL 180 tablet 0   esomeprazole (NEXIUM) 20 MG capsule Take 20 mg by mouth daily at 12 noon.     gabapentin (NEURONTIN) 100 MG capsule Take 1 capsule (100 mg total) by mouth 3 (three) times daily. 90 capsule 3   glucose blood (ACCU-CHEK AVIVA PLUS) test strip Use as instructed 100 each 12   lidocaine (XYLOCAINE) 2 % jelly Apply 1 Application topically as needed. 85 g 0   ONE TOUCH CLUB LANCETS MISC 1 each by Does not apply route daily. 100 each 11   OZEMPIC, 0.25 OR 0.5 MG/DOSE, 2 MG/3ML SOPN INJECT 0.5 INTO THE SKIN ONCE A WEEK  3 mL 2   aspirin EC 81 MG tablet Take 81 mg by mouth daily. Swallow whole. (Patient not taking: Reported on 06/06/2023)     Menthol, Topical Analgesic, (BIOFREEZE) 10 % CREA Apply 1 Application topically 3 (three) times daily as needed (pain). (Patient not taking: Reported on 06/06/2023)     prochlorperazine (COMPAZINE) 10 MG tablet Take 1 tablet (10 mg total) by mouth every 6 (six) hours as needed for nausea or vomiting. (Patient not taking: Reported on 06/06/2023) 30 tablet 2   No current facility-administered medications for this visit.    PHYSICAL EXAMINATION: ECOG PERFORMANCE STATUS: 1 - Symptomatic but completely ambulatory  Vitals:   06/06/23 1117  BP: (!) 154/74  Pulse: (!) 105  Resp: 18  Temp: 97.9 F (36.6 C)  SpO2: 97%   Filed Weights   06/06/23 1117  Weight: (!) 339 lb 9.6 oz (154 kg)      LABORATORY DATA:  I have reviewed the data as listed    Latest Ref Rng & Units 06/06/2023   10:25 AM 05/09/2023    2:38 PM 04/24/2023    2:47 PM  CMP  Glucose 70 - 99 mg/dL 401  027  253   BUN 6 - 20 mg/dL 13  16  19  Creatinine 0.44 - 1.00 mg/dL 4.09  8.11  9.14   Sodium 135 - 145 mmol/L 136  134  135   Potassium 3.5 - 5.1 mmol/L 4.4  4.1  4.1   Chloride 98 - 111 mmol/L 103  102  101   CO2 22 - 32 mmol/L 29  26  26    Calcium 8.9 - 10.3 mg/dL 8.8  9.2  9.1   Total Protein 6.5 - 8.1 g/dL 6.9  7.4  7.4   Total Bilirubin 0.0 - 1.2 mg/dL 0.6  0.6  0.5   Alkaline Phos 38 - 126 U/L 56  62  65   AST 15 - 41 U/L 19  21  23    ALT 0 - 44 U/L 22  22  25      Lab Results  Component Value Date   WBC 4.0 06/06/2023   HGB 10.3 (L) 06/06/2023   HCT 29.7 (L) 06/06/2023   MCV 83.0 06/06/2023   PLT 176 06/06/2023   NEUTROABS 2.9 06/06/2023    ASSESSMENT & PLAN:  Malignant neoplasm of overlapping sites of left breast in female, 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   06/27/2022: CT CAP:  Left breast cancer, 2 left axillary lymph nodes, left adnexal cystic lesion 4 cm    Treatment Plan:  1. Neoadjuvant chemotherapy with Adriamycin and Cytoxan dose dense 4 followed by Taxol weekly 10 completed 10/16/2022 2. 11/20/2022: Left lumpectomy: No residual cancer, margins negative, 0/14 lymph nodes 3. Followed by adjuvant radiation therapy 01/22/2023-03/21/2023 4.  Followed by antiestrogen therapy to start 04/03/2023 ____________________________________________________ Antiestrogen therapy: I recommended ovarian function suppression with Zoladex along with anastrozole with Verzenio (start 04/10/23)X 2 years, switched to letrozole 06/06/2023   Verzinio toxicities: Headaches: She stopped anastrozole and it improved slightly.  We are getting a brain MRI for further evaluation.  I sent a new prescription for letrozole. Intermittent diarrhea and constipation Fatigue Labs and follow-up in 2 months ------------------------------------- Assessment and Plan Assessment & Plan Breast Cancer Undergoing Verzenio treatment. Intermittent diarrhea linked to diet. Anemia stable but slightly decreased. 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 - Continue Verzenio. - Monitor anemia closely. - Advise on dietary management for diarrhea. - Consider interventions for neuropathy.  Headaches Persistent headaches at neck base post-anastrozole. Awaiting MRI approval. Letrozole considered. - Follow up on MRI approval and schedule. - Prescribe letrozole.  Hyperglycemia Blood glucose at 176, influenced by diet and stress. - Monitor blood glucose closely. - Advise on dietary management.      No orders of the defined types were placed in this encounter.  The patient has a good understanding of the overall plan. she agrees with it. she will call with any problems that may develop before the next visit here. Total time spent: 30 mins including face to face time and time spent for planning, charting and co-ordination  of care   Tamsen Meek, MD 06/06/23

## 2023-06-06 NOTE — Telephone Encounter (Signed)
 I informed her that the brain MRI was normal.

## 2023-06-06 NOTE — Assessment & Plan Note (Signed)
 stage IIIB ER/PR positive breast cancer diagnosed in 06/2022 currently receiving neoadjuvant chemotherapy.   06/27/2022: CT CAP: Left breast cancer, 2 left axillary lymph nodes, left adnexal cystic lesion 4 cm    Treatment Plan:  1. Neoadjuvant chemotherapy with Adriamycin and Cytoxan dose dense 4 followed by Taxol weekly 10 completed 10/16/2022 2. 11/20/2022: Left lumpectomy: No residual cancer, margins negative, 0/14 lymph nodes 3. Followed by adjuvant radiation therapy 01/22/2023-03/21/2023 4.  Followed by antiestrogen therapy to start 04/03/2023 ____________________________________________________ Antiestrogen therapy: I recommended ovarian function suppression with Zoladex along with anastrozole with Verzenio (start 04/10/23)X 2 years   Verzinio toxicities: Headaches: I discussed with her about adding anastrozole for we can see if that makes a difference. Intermittent diarrhea and constipation Fatigue Labs and follow-up in 2 months

## 2023-06-06 NOTE — Progress Notes (Signed)
 Pt scheduled for brain MRI today.  Pt with hx of claustrophobia and verbal orders received from MD for pt to be prescribed Ativan 1 mg p.o tablet to take today during appt prior to MRI.  Orders placed.

## 2023-06-07 ENCOUNTER — Telehealth: Payer: Self-pay | Admitting: Hematology and Oncology

## 2023-06-07 ENCOUNTER — Telehealth: Payer: Self-pay | Admitting: *Deleted

## 2023-06-07 NOTE — Telephone Encounter (Signed)
 Scheduled appointments per 3/6 los. Left VM with appointment details.

## 2023-06-07 NOTE — Telephone Encounter (Signed)
 RN placed call to pt with Brain MRI results being negative.

## 2023-06-11 ENCOUNTER — Ambulatory Visit: Payer: BC Managed Care – PPO | Admitting: Rehabilitation

## 2023-06-18 ENCOUNTER — Other Ambulatory Visit: Payer: Self-pay | Admitting: Nurse Practitioner

## 2023-06-20 ENCOUNTER — Other Ambulatory Visit (HOSPITAL_COMMUNITY): Payer: Self-pay

## 2023-06-20 ENCOUNTER — Other Ambulatory Visit: Payer: Self-pay

## 2023-06-20 NOTE — Telephone Encounter (Signed)
 Requesting: OZEMPIC 0.25 OR 0.5MG  DOS(2MG /3ML)  Last Visit: 02/18/2023 Next Visit: Visit date not found Last Refill: 03/12/2023  Please Advise

## 2023-06-21 ENCOUNTER — Telehealth: Payer: Self-pay | Admitting: *Deleted

## 2023-06-21 ENCOUNTER — Other Ambulatory Visit (HOSPITAL_COMMUNITY): Payer: Self-pay

## 2023-06-21 ENCOUNTER — Inpatient Hospital Stay (HOSPITAL_BASED_OUTPATIENT_CLINIC_OR_DEPARTMENT_OTHER): Admitting: Physician Assistant

## 2023-06-21 VITALS — BP 162/86 | HR 101 | Temp 97.3°F | Resp 18 | Wt 347.3 lb

## 2023-06-21 DIAGNOSIS — Z923 Personal history of irradiation: Secondary | ICD-10-CM | POA: Diagnosis not present

## 2023-06-21 DIAGNOSIS — Z9221 Personal history of antineoplastic chemotherapy: Secondary | ICD-10-CM | POA: Diagnosis not present

## 2023-06-21 DIAGNOSIS — R197 Diarrhea, unspecified: Secondary | ICD-10-CM | POA: Diagnosis not present

## 2023-06-21 DIAGNOSIS — C50812 Malignant neoplasm of overlapping sites of left female breast: Secondary | ICD-10-CM

## 2023-06-21 DIAGNOSIS — N644 Mastodynia: Secondary | ICD-10-CM

## 2023-06-21 DIAGNOSIS — Z170 Estrogen receptor positive status [ER+]: Secondary | ICD-10-CM

## 2023-06-21 DIAGNOSIS — R519 Headache, unspecified: Secondary | ICD-10-CM | POA: Diagnosis not present

## 2023-06-21 DIAGNOSIS — F419 Anxiety disorder, unspecified: Secondary | ICD-10-CM

## 2023-06-21 DIAGNOSIS — Z79899 Other long term (current) drug therapy: Secondary | ICD-10-CM | POA: Diagnosis not present

## 2023-06-21 DIAGNOSIS — Z5111 Encounter for antineoplastic chemotherapy: Secondary | ICD-10-CM | POA: Diagnosis not present

## 2023-06-21 DIAGNOSIS — Z7982 Long term (current) use of aspirin: Secondary | ICD-10-CM | POA: Diagnosis not present

## 2023-06-21 DIAGNOSIS — Z79811 Long term (current) use of aromatase inhibitors: Secondary | ICD-10-CM | POA: Diagnosis not present

## 2023-06-21 DIAGNOSIS — E1165 Type 2 diabetes mellitus with hyperglycemia: Secondary | ICD-10-CM | POA: Diagnosis not present

## 2023-06-21 MED ORDER — CEPHALEXIN 500 MG PO CAPS
ORAL_CAPSULE | ORAL | 0 refills | Status: AC
  Filled 2023-06-21: qty 64, 14d supply, fill #0

## 2023-06-21 NOTE — Progress Notes (Signed)
 Symptom Management Consult Note [facility]    Patient Care Team: Gerre Scull, NP as PCP - General (Internal Medicine) Serena Croissant, MD as Consulting Physician (Hematology and Oncology) Emelia Loron, MD as Consulting Physician (General Surgery) Dorothy Puffer, MD as Consulting Physician (Radiation Oncology)    Name / MRN / DOB: Lynn [name]  [id number]  October 09, 1985   Date of visit: 06/21/2023   Chief Complaint/Reason for visit: left breast pain   Current Therapy: Zoladex, letrozole, verzenio   ASSESSMENT & PLAN: Patient is a 38 y.o. female with oncologic history of malignant neoplasm of overlapping sites of left breast in female, estrogen receptor positive followed by Dr. Pamelia Hoit.  I have viewed most recent oncology note and lab work.   Assessment & Plan  #Malignant neoplasm of overlapping sites of left breast in female, estrogen receptor positive  -Next appointment with pharmacist is 08/01/23. - Planning to pick up Letrozole from the pharmacy today,  #Left breast pain - Concern for radiation recall. Symptoms of heaviness, swelling, and redness in the left breast. Physical exam today without palpable mass. - Chart review shows recent mammogram 05/29/23 without evidence of malignancy. Patient had PT visit on 05/27/23 for "pulling pain in breast." SOZO screening from that visit showed that she was within the recommended range when compared to baseline.  - Prescribed high-dose Keflex for first two days, then lower dose, every six hours x 12 days. - Follow-up in two weeks to assess improvement  - Encouraged rescheduling physical therapy for potential cording evaluation.   #History of anxiety -Patient experiencing emotional distress and anxiety due to personal loss and fear of cancer recurrence. Discussed referral to chaplain and support groups.    Strict ED precautions discussed should symptoms worsen.   Heme/Onc History: Oncology History   Malignant neoplasm of overlapping sites of left breast in female, estrogen receptor positive (HCC)  05/29/2022 Initial Diagnosis   Palpable abnormalities in the left breast led to mammograms which revealed 3 masses 0.7 cm at 1 o'clock position, 3 cm at 2 o'clock position, additional nodule 8 mm total span 5 cm, 5 abnormal axillary lymph nodes biopsy of the masses and the lymph node were positive for grade 3 IDC with high-grade DCIS with LVI, ER 95%, PR 40%, Ki-67 60%, HER2 2+ by IHC FISH negative   06/15/2022 Cancer Staging   Staging form: Breast, AJCC 8th Edition - Clinical: Stage IIIB (cT3, cN3, cM0, G3, ER+, PR+, HER2-) - Signed by Loa Socks, NP on 06/15/2022 Stage prefix: Initial diagnosis Histologic grading system: 3 grade system   06/15/2022 Breast MRI   IMPRESSION: 1. Multiple sites of biopsy proven malignancy with in the lateral left breast. The overall conglomerate of suspicious findings spans approximately 4.4 x 3.5 x 3.5 cm, and contains all 3 post biopsy marking clips. 2. 10+ morphologically abnormal level I, II, III and Rotter's lymph nodes within the left axilla. 3. Diffuse skin and trabecular thickening on the left, consistent with lymphovascular spread of disease. 4. No MRI evidence of malignancy on the right.   06/20/2022 - 10/09/2022 Chemotherapy   Patient is on Treatment Plan : BREAST ADJUVANT DOSE DENSE AC q14d / PACLitaxel q7d      Genetic Testing   [facility] Multi-Cancer Panel+RNA was Negative. Report date is 06/13/2022.  The Multi-Cancer + RNA Panel offered by [facility] includes sequencing and/or deletion/duplication analysis of the following 70 genes:  AIP*, ALK, APC*, ATM*, AXIN2*, BAP1*, BARD1*, BLM*, BMPR1A*, BRCA1*, BRCA2*, BRIP1*,  CDC73*, CDH1*, CDK4, CDKN1B*, CDKN2A, CHEK2*, CTNNA1*, DICER1*, EPCAM (del/dup only), EGFR, FH*, FLCN*, GREM1 (promoter dup only), HOXB13, KIT, LZTR1, MAX*, MBD4, MEN1*, MET, MITF, MLH1*, MSH2*, MSH3*, MSH6*, MUTYH*, NF1*, NF2*,  NTHL1*, PALB2*, PDGFRA, PMS2*, POLD1*, POLE*, POT1*, PRKAR1A*, PTCH1*, PTEN*, RAD51C*, RAD51D*, RB1*, RET, SDHA* (sequencing only), SDHAF2*, SDHB*, SDHC*, SDHD*, SMAD4*, SMARCA4*, SMARCB1*, SMARCE1*, STK11*, SUFU*, TMEM127*, TP53*, TSC1*, TSC2*, VHL*. RNA analysis is performed for * genes.   11/20/2022 Surgery   A. BREAST, LEFT, LUMPECTOMY:  Fibrosis and slight inflammation consistent with treatment effects  No residual invasive or in situ carcinoma (ypT0)  Margins: All margins negative for carcinoma  Lymphovascular space invasion: Not identified  Biopsy site and biopsy clip  See oncology table   14 LN biopsied negative for cancer.    01/22/2023 - 03/21/2023 Radiation Therapy   Plan Name: Breast_L_BH Site: Breast, Left Technique: 3D Mode: Photon Dose Per Fraction: 1.8 Gy Prescribed Dose (Delivered / Prescribed): 50.4 Gy / 50.4 Gy Prescribed Fxs (Delivered / Prescribed): 28 / 28   Plan Name: Brst_L_SCV_BH Site: Sclav-LT Technique: 3D Mode: Photon Dose Per Fraction: 1.8 Gy Prescribed Dose (Delivered / Prescribed): 50.4 Gy / 50.4 Gy Prescribed Fxs (Delivered / Prescribed): 28 / 28   Plan Name: Brst_L_BH_Bst Site: Breast, Left Technique: 3D Mode: Photon Dose Per Fraction: 2 Gy Prescribed Dose (Delivered / Prescribed): 10 Gy / 10 Gy Prescribed Fxs (Delivered / Prescribed): 5 / 5   04/2023 -  Anti-estrogen oral therapy   Anastrozole       Interval history-: Discussed the use of AI scribe software for clinical note transcription with the patient, who gave verbal consent to proceed.  Lynn [name] is a 38 y.o. female with oncologic history as above presenting to [facility] today with chief complaint of left breast pain.Patient presents unaccompanied to visit today.  History of Present Illness She first noticed a band-like sensation on the side of the breast approximately 1 month ago, which was followed by a feeling of heaviness. She also reports swelling that worsens by the end of  the day and skin changes including lumpiness, dimpling, and enlarged pores. She thinks the breast might look red. The symptoms were first only present on side of her breast and has recently spread to the entire breast. She denies any nipple discharge. The patient also mentions a recent personal loss and the emotional stress related to it. She denies any fever or chills.    ROS  All other systems are reviewed and are negative for acute change except as noted in the HPI.    Allergies  Allergen Reactions   Macrobid [Nitrofurantoin Macrocrystal] Shortness Of Breath and Nausea And Vomiting   Ibuprofen Hives     Past Medical History:  Diagnosis Date   Anemia    Cancer (HCC) 2024   L breast   Carpal tunnel syndrome, left    Depression    Diabetes (HCC)    Dysrhythmia    Palpitations   GERD (gastroesophageal reflux disease)    Hypertension    Iron deficiency    Panic attacks    PCOS (polycystic ovarian syndrome)    Personal history of chemotherapy    Pneumonia    Port-A-Cath in place 07/31/2022   Tachycardia    Umbilical hernia      Past Surgical History:  Procedure Laterality Date   ADENOIDECTOMY     As a child   AXILLARY LYMPH NODE DISSECTION Left 11/20/2022   Procedure: LEFT AXILLARY DISSECTION;  Surgeon: Emelia Loron, MD;  Location: [facility] OR;  Service: General;  Laterality: Left;   BREAST BIOPSY Left 05/29/2022   [place] LT BREAST BX W LOC DEV 1ST LESION IMG BX SPEC US GUIDE 05/29/2022 [facility]MO[facility]   BREAST BIOPSY Left 05/29/2022   [place] LT BREAST BX W LOC DEV EA ADD LESION IMG BX SPEC US GUIDE 05/29/2022 [facility]   BREAST BIOPSY Left 06/06/2022   MM LT BREAST BX W LOC DEV 1ST LESION IMAGE BX SPEC STEREO GUIDE 06/06/2022 [facility]   BREAST BIOPSY  11/19/2022   MM LT RADIOACTIVE SEED LOC MAMMO GUIDE 11/19/2022 [facility]   BREAST BIOPSY  11/19/2022   MM LT RADIOACTIVE SEED EA ADD LESION LOC MAMMO GUIDE 11/19/2022 [facility]   BREAST BIOPSY   11/19/2022   MM LT RADIOACTIVE SEED EA ADD LESION LOC MAMMO GUIDE 11/19/2022 [facility]   BREAST LUMPECTOMY WITH RADIOACTIVE SEED AND AXILLARY LYMPH NODE DISSECTION Left 11/20/2022   Procedure: LEFT BREAST LUMPECTOMY WITH RADIOACTIVE SEED;  Surgeon: Emelia Loron, MD;  Location: [facility] OR;  Service: General;  Laterality: Left;  PEC BLOCK   CESAREAN SECTION  2015   PORT-A-CATH REMOVAL Right 11/20/2022   Procedure: REMOVAL PORT-A-CATH;  Surgeon: Emelia Loron, MD;  Location[facility] OR;  Service: General;  Laterality: Right;   PORTACATH PLACEMENT N/A 06/19/2022   Procedure: INSERTION PORT-A-CATH;  Surgeon: Emelia Loron, MD;  Location: [facility] OR;  Service: General;  Laterality: N/A;   TONSILLECTOMY     As a child    Social History   Socioeconomic History   Marital status: Significant Other    Spouse name: Not on file   Number of children: 1   Years of education: some college   Highest education level: Some college, no degree  Occupational History   Not on file  Tobacco Use   Smoking status: Former    Current packs/day: 0.00    Average packs/day: 1 pack/day for 7.0 years (7.0 ttl pk-yrs)    Types: Cigarettes, E-cigarettes    Start date: 03/20/2007    Quit date: 10/29/2021    Years since quitting: 1.6   Smokeless tobacco: Never  Vaping Use   Vaping status: Former   Quit date: 04/03/2019  Substance and Sexual Activity   Alcohol use: Not Currently    Comment: rarely   Drug use: Yes    Types: Marijuana   Sexual activity: Yes    Birth control/protection: Condom  Other Topics Concern   Not on file  Social History Narrative      Social Drivers of Health   Financial Resource Strain: Medium Risk (04/29/2023)   Overall Financial Resource Strain (CARDIA)    Difficulty of Paying Living Expenses: Somewhat hard  Food Insecurity: Food Insecurity Present (04/29/2023)   Hunger Vital Sign    Worried About Running Out of Food in the Last Year: Sometimes true    Ran Out of Food in  the Last Year: Sometimes true  Transportation Needs: No Transportation Needs (04/29/2023)   PRAPARE - Administrator, Civil Service (Medical): No    Lack of Transportation (Non-Medical): No  Physical Activity: Sufficiently Active (04/29/2023)   Exercise Vital Sign    Days of Exercise per Week: 5 days    Minutes of Exercise per Session: 30 min  Stress: Stress Concern Present (04/29/2023)   [employer] of Occupational Health - Occupational Stress Questionnaire    Feeling of Stress : To some extent  Social Connections: Socially Integrated (04/29/2023)   Social Connection and  Isolation Panel [NHANES]    Frequency of Communication with Friends and Family: More than three times a week    Frequency of Social Gatherings with Friends and Family: Once a week    Attends Religious Services: More than 4 times per year    Active Member of [employer] or Organizations: Yes    Attends Engineer, structural: More than 4 times per year    Marital Status: Married  Catering manager Violence: Not At Risk (11/20/2022)   Humiliation, Afraid, Rape, and Kick questionnaire    Fear of Current or Ex-Partner: No    Emotionally Abused: No    Physically Abused: No    Sexually Abused: No    Family History  Problem Relation Age of Onset   Heart disease Mother    Diabetes Mother    Hypertension Mother    Kidney disease Mother    Heart failure Mother    Heart attack Mother 29   Arthritis Mother    Asthma Mother    COPD Mother    Depression Mother    Hyperlipidemia Mother    Mental illness Mother    Bladder Cancer Mother    Heart disease Father    Heart attack Father 57   COPD Father    Hyperlipidemia Father    Hypertension Father    Mental illness Father    Arthritis Sister    Depression Sister    Diabetes Sister    Hypertension Sister    Mental illness Sister    Miscarriages / Stillbirths Sister    Alcohol abuse Sister    Depression Sister    Diabetes Sister    Heart disease  Sister    Heart attack Sister 23   Arthritis Sister    Kidney disease Sister    Transient ischemic attack Sister    Bladder Cancer Maternal Aunt    Colon cancer Maternal Aunt 41 - 2   Other Maternal Aunt 50 - 59       brain tumor (unknown if benign or malignant)   Lung cancer Paternal Aunt 43   Melanoma Paternal Aunt 4 - 49   Heart failure Maternal Grandmother    Arthritis Maternal Grandmother    Alcohol abuse Maternal Grandmother    COPD Maternal Grandmother    Diabetes Maternal Grandmother    Hearing loss Maternal Grandmother    Heart disease Maternal Grandmother    Hyperlipidemia Maternal Grandmother    Hypertension Maternal Grandmother    Stroke Maternal Grandmother    Miscarriages / Stillbirths Maternal Grandmother    Mental illness Maternal Grandmother    Heart attack Maternal Grandmother    Heart failure Maternal Grandfather    Alcohol abuse Maternal Grandfather    Arthritis Maternal Grandfather    Early death Maternal Grandfather    Heart disease Maternal Grandfather    Hyperlipidemia Maternal Grandfather    Hypertension Maternal Grandfather    Heart attack Maternal Grandfather 60   Mental illness Maternal Grandfather    Heart failure Paternal Grandmother    Arthritis Paternal Grandmother    Diabetes Paternal Grandmother    Heart attack Paternal Grandmother    Miscarriages / Stillbirths Paternal Grandmother    Mental illness Paternal Grandmother    Kidney disease Paternal Grandmother    Intellectual disability Paternal Grandmother    Hypertension Paternal Grandmother    Hyperlipidemia Paternal Grandmother    Heart disease Paternal Grandmother    Heart failure Paternal Grandfather    Arthritis Paternal Grandfather  Heart attack Paternal Grandfather    Mental illness Paternal Grandfather    Hearing loss Paternal Grandfather    Heart disease Paternal Grandfather    Hyperlipidemia Paternal Grandfather    Hypertension Paternal Grandfather      Current  Outpatient Medications:    cephALEXin (KEFLEX) 500 MG capsule, Take 2 capsules (1,000 mg total) by mouth every 6 (six) hours for 2 days, THEN 1 capsule (500 mg total) every 6 (six) hours for 12 days., Disp: 64 capsule, Rfl: 0   abemaciclib (VERZENIO) 100 MG tablet, Take 1 tablet (100 mg total) by mouth 2 (two) times daily., Disp: 60 tablet, Rfl: 3   ALPRAZolam (XANAX) 0.5 MG tablet, TAKE 1 TABLET BY MOUTH AT BEDTIME AS NEEDED FOR ANXIETY, Disp: 30 tablet, Rfl: 3   aspirin EC 81 MG tablet, Take 81 mg by mouth daily. Swallow whole. (Patient not taking: Reported on 06/06/2023), Disp: , Rfl:    aspirin-acetaminophen-caffeine (EXCEDRIN MIGRAINE) 250-250-65 MG tablet, Take 1-2 tablets by mouth 2 (two) times daily as needed for headache or migraine., Disp: , Rfl:    Blood Glucose Monitoring Suppl (ACCU-CHEK AVIVA PLUS) w/Device KIT, 1 each by Does not apply route daily at 6 (six) AM., Disp: 1 kit, Rfl: 0   carvedilol (COREG) 25 MG tablet, TAKE 1 TABLET(25 MG) BY MOUTH TWICE DAILY WITH A MEAL, Disp: 180 tablet, Rfl: 0   esomeprazole (NEXIUM) 20 MG capsule, Take 20 mg by mouth daily at 12 noon., Disp: , Rfl:    gabapentin (NEURONTIN) 100 MG capsule, Take 1 capsule (100 mg total) by mouth 3 (three) times daily., Disp: 90 capsule, Rfl: 3   glucose blood (ACCU-CHEK AVIVA PLUS) test strip, Use as instructed, Disp: 100 each, Rfl: 12   letrozole (FEMARA) 2.5 MG tablet, Take 1 tablet (2.5 mg total) by mouth daily., Disp: 90 tablet, Rfl: 3   lidocaine (XYLOCAINE) 2 % jelly, Apply 1 Application topically as needed., Disp: 85 g, Rfl: 0   Menthol, Topical Analgesic, (BIOFREEZE) 10 % CREA, Apply 1 Application topically 3 (three) times daily as needed (pain). (Patient not taking: Reported on 06/06/2023), Disp: , Rfl:    ONE TOUCH CLUB LANCETS MISC, 1 each by Does not apply route daily., Disp: 100 each, Rfl: 11   OZEMPIC, 0.25 OR 0.5 MG/DOSE, 2 MG/3ML SOPN, INJECT 0.5 INTO THE SKIN ONCE A WEEK, Disp: 3 mL, Rfl: 2    prochlorperazine (COMPAZINE) 10 MG tablet, Take 1 tablet (10 mg total) by mouth every 6 (six) hours as needed for nausea or vomiting. (Patient not taking: Reported on 06/06/2023), Disp: 30 tablet, Rfl: 2  PHYSICAL EXAM: ECOG FS:1 - Symptomatic but completely ambulatory    Vitals:   06/21/23 1131  BP: (!) 162/86  Pulse: (!) 101  Resp: 18  Temp: (!) 97.3 F (36.3 C)  TempSrc: Temporal  SpO2: 96%  Weight: (!) 347 lb 4.8 oz (157.5 kg)   Physical Exam Vitals and nursing note reviewed.  Constitutional:      Appearance: She is not ill-appearing or toxic-appearing.  HENT:     Head: Normocephalic.  Eyes:     Conjunctiva/sclera: Conjunctivae normal.  Cardiovascular:     Rate and Rhythm: Normal rate.     Pulses: Normal pulses.     Comments: HR in the 90s during exam Pulmonary:     Effort: Pulmonary effort is normal.  Chest:     Comments: Patient is status post left lumpectomy with a well-healed surgical scar. No evidence of any palpable  masses. No evidence of axillary adenopathy. Left breast with faint erythema and diffuse tenderness. Skin is dimpled. Please see media below. Abdominal:     General: There is no distension.  Musculoskeletal:     Cervical back: Normal range of motion.     Comments: Compartments soft in left upper extremity. No swelling.  Skin:    General: Skin is warm and dry.  Neurological:     Mental Status: She is alert.        LABORATORY DATA: I have reviewed the data as listed    Latest Ref Rng & Units 06/06/2023   10:25 AM 05/09/2023    2:38 PM 04/24/2023    2:47 PM  CBC  WBC 4.0 - 10.5 K/uL 4.0  3.9  3.8   Hemoglobin 12.0 - 15.0 g/dL 40.9  81.1  91.4   Hematocrit 36.0 - 46.0 % 29.7  33.3  35.2   Platelets 150 - 400 K/uL 176  219  217         Latest Ref Rng & Units 06/06/2023   10:25 AM 05/09/2023    2:38 PM 04/24/2023    2:47 PM  CMP  Glucose 70 - 99 mg/dL 782  956  213   BUN 6 - 20 mg/dL 13  16  19    Creatinine 0.44 - 1.00 mg/dL 0.86  5.78  4.69    Sodium 135 - 145 mmol/L 136  134  135   Potassium 3.5 - 5.1 mmol/L 4.4  4.1  4.1   Chloride 98 - 111 mmol/L 103  102  101   CO2 22 - 32 mmol/L 29  26  26    Calcium 8.9 - 10.3 mg/dL 8.8  9.2  9.1   Total Protein 6.5 - 8.1 g/dL 6.9  7.4  7.4   Total Bilirubin 0.0 - 1.2 mg/dL 0.6  0.6  0.5   Alkaline Phos 38 - 126 U/L 56  62  65   AST 15 - 41 U/L 19  21  23    ALT 0 - 44 U/L 22  22  25         RADIOGRAPHIC STUDIES (from last 24 hours if applicable) I have personally reviewed the radiological images as listed and agreed with the findings in the report. No results found.      Visit Diagnosis: 1. Malignant neoplasm of overlapping sites of left breast in female, estrogen receptor positive (HCC)   2. Breast pain, left   3. Anxiety in oncology patient      No orders of the defined types were placed in this encounter.   All questions were answered. The patient knows to call the clinic with any problems, questions or concerns. No barriers to learning was detected.  A total of more than 30 minutes were spent on this encounter with face-to-face time and non-face-to-face time, including preparing to see the patient, ordering tests and/or medications, counseling the patient and coordination of care as outlined above.    Thank you for allowing me to participate in the care of this patient.    Shanon Ace, PA-C Department of Hematology/Oncology [facility] at [facility] Phone: [phone]  [phone]    06/21/2023 12:35 PM

## 2023-06-21 NOTE — Telephone Encounter (Signed)
 Received call from pt with complaint of left breast pain, heaviness, hard to the touch, and enlarged poors x several days. MD out of office, RN sent message to [facility] team for further evaluation and tx.  Appt scheduled.

## 2023-06-24 ENCOUNTER — Encounter: Payer: Self-pay | Admitting: General Practice

## 2023-06-24 ENCOUNTER — Other Ambulatory Visit: Payer: Self-pay | Admitting: Pharmacy Technician

## 2023-06-24 ENCOUNTER — Other Ambulatory Visit: Payer: Self-pay

## 2023-06-24 NOTE — Progress Notes (Signed)
 [facility] Spiritual Care Note  Referred by Darnelle Going for additional layer of support. Left voicemail with direct number and encouragement to return call.   [address] [place], [place], [facility] Pager [phone] Voicemail [phone]

## 2023-06-24 NOTE — Progress Notes (Signed)
 Specialty Pharmacy Refill Coordination Note  Lynn [name] is a 38 y.o. female contacted today regarding refills of specialty medication(s) Abemaciclib [name])   Patient requested Delivery   Delivery date: 06/28/23   Verified address: Patient address [address]  [place]   Medication will be filled on 06/27/23.

## 2023-06-27 ENCOUNTER — Other Ambulatory Visit (HOSPITAL_COMMUNITY): Payer: Self-pay

## 2023-06-27 ENCOUNTER — Other Ambulatory Visit: Payer: Self-pay

## 2023-07-02 ENCOUNTER — Encounter: Payer: Self-pay | Admitting: General Practice

## 2023-07-02 NOTE — Progress Notes (Signed)
 [facility] Spiritual Care Note  Rosanna Bickle upon follow-up phone call, scheduling Spiritual Care visit following injection on 07/04/2023. Added her to [facility]) interest list per her request, as well.   [address] [place], [place], [facility] Pager [phone] Voicemail [phone]

## 2023-07-04 ENCOUNTER — Inpatient Hospital Stay: Payer: BC Managed Care – PPO | Attending: Hematology and Oncology

## 2023-07-04 ENCOUNTER — Other Ambulatory Visit: Payer: Self-pay

## 2023-07-04 ENCOUNTER — Inpatient Hospital Stay: Admitting: General Practice

## 2023-07-04 VITALS — BP 155/89 | HR 107 | Temp 98.0°F | Resp 20

## 2023-07-04 DIAGNOSIS — Z923 Personal history of irradiation: Secondary | ICD-10-CM | POA: Diagnosis not present

## 2023-07-04 DIAGNOSIS — Z801 Family history of malignant neoplasm of trachea, bronchus and lung: Secondary | ICD-10-CM | POA: Diagnosis not present

## 2023-07-04 DIAGNOSIS — Z9221 Personal history of antineoplastic chemotherapy: Secondary | ICD-10-CM | POA: Diagnosis not present

## 2023-07-04 DIAGNOSIS — I10 Essential (primary) hypertension: Secondary | ICD-10-CM | POA: Diagnosis not present

## 2023-07-04 DIAGNOSIS — Z808 Family history of malignant neoplasm of other organs or systems: Secondary | ICD-10-CM | POA: Diagnosis not present

## 2023-07-04 DIAGNOSIS — Z800 Family history of malignant neoplasm of digestive organs: Secondary | ICD-10-CM | POA: Insufficient documentation

## 2023-07-04 DIAGNOSIS — E119 Type 2 diabetes mellitus without complications: Secondary | ICD-10-CM | POA: Insufficient documentation

## 2023-07-04 DIAGNOSIS — Z8052 Family history of malignant neoplasm of bladder: Secondary | ICD-10-CM | POA: Insufficient documentation

## 2023-07-04 DIAGNOSIS — K449 Diaphragmatic hernia without obstruction or gangrene: Secondary | ICD-10-CM | POA: Diagnosis not present

## 2023-07-04 DIAGNOSIS — Z7985 Long-term (current) use of injectable non-insulin antidiabetic drugs: Secondary | ICD-10-CM | POA: Insufficient documentation

## 2023-07-04 DIAGNOSIS — E282 Polycystic ovarian syndrome: Secondary | ICD-10-CM | POA: Insufficient documentation

## 2023-07-04 DIAGNOSIS — F32A Depression, unspecified: Secondary | ICD-10-CM | POA: Insufficient documentation

## 2023-07-04 DIAGNOSIS — Z1732 Human epidermal growth factor receptor 2 negative status: Secondary | ICD-10-CM | POA: Insufficient documentation

## 2023-07-04 DIAGNOSIS — C50812 Malignant neoplasm of overlapping sites of left female breast: Secondary | ICD-10-CM | POA: Insufficient documentation

## 2023-07-04 DIAGNOSIS — Z7982 Long term (current) use of aspirin: Secondary | ICD-10-CM | POA: Diagnosis not present

## 2023-07-04 DIAGNOSIS — Z79899 Other long term (current) drug therapy: Secondary | ICD-10-CM | POA: Insufficient documentation

## 2023-07-04 DIAGNOSIS — Z170 Estrogen receptor positive status [ER+]: Secondary | ICD-10-CM | POA: Insufficient documentation

## 2023-07-04 DIAGNOSIS — K219 Gastro-esophageal reflux disease without esophagitis: Secondary | ICD-10-CM | POA: Diagnosis not present

## 2023-07-04 DIAGNOSIS — Z79811 Long term (current) use of aromatase inhibitors: Secondary | ICD-10-CM | POA: Insufficient documentation

## 2023-07-04 DIAGNOSIS — I890 Lymphedema, not elsewhere classified: Secondary | ICD-10-CM | POA: Diagnosis not present

## 2023-07-04 MED ORDER — GOSERELIN ACETATE 3.6 MG ~~LOC~~ IMPL
3.6000 mg | DRUG_IMPLANT | Freq: Once | SUBCUTANEOUS | Status: AC
  Administered 2023-07-04: 3.6 mg via SUBCUTANEOUS
  Filled 2023-07-04: qty 3.6

## 2023-07-05 ENCOUNTER — Encounter: Payer: Self-pay | Admitting: Rehabilitation

## 2023-07-05 ENCOUNTER — Inpatient Hospital Stay: Admitting: Physician Assistant

## 2023-07-05 ENCOUNTER — Ambulatory Visit: Attending: Hematology and Oncology | Admitting: Rehabilitation

## 2023-07-05 VITALS — BP 142/81 | HR 101 | Temp 97.6°F | Resp 19 | Wt 344.1 lb

## 2023-07-05 DIAGNOSIS — M25512 Pain in left shoulder: Secondary | ICD-10-CM | POA: Insufficient documentation

## 2023-07-05 DIAGNOSIS — Z1732 Human epidermal growth factor receptor 2 negative status: Secondary | ICD-10-CM | POA: Diagnosis not present

## 2023-07-05 DIAGNOSIS — E282 Polycystic ovarian syndrome: Secondary | ICD-10-CM | POA: Diagnosis not present

## 2023-07-05 DIAGNOSIS — I890 Lymphedema, not elsewhere classified: Secondary | ICD-10-CM

## 2023-07-05 DIAGNOSIS — Z7985 Long-term (current) use of injectable non-insulin antidiabetic drugs: Secondary | ICD-10-CM | POA: Diagnosis not present

## 2023-07-05 DIAGNOSIS — K449 Diaphragmatic hernia without obstruction or gangrene: Secondary | ICD-10-CM | POA: Diagnosis not present

## 2023-07-05 DIAGNOSIS — E119 Type 2 diabetes mellitus without complications: Secondary | ICD-10-CM | POA: Diagnosis not present

## 2023-07-05 DIAGNOSIS — Z79899 Other long term (current) drug therapy: Secondary | ICD-10-CM | POA: Diagnosis not present

## 2023-07-05 DIAGNOSIS — Z483 Aftercare following surgery for neoplasm: Secondary | ICD-10-CM | POA: Diagnosis not present

## 2023-07-05 DIAGNOSIS — K219 Gastro-esophageal reflux disease without esophagitis: Secondary | ICD-10-CM | POA: Diagnosis not present

## 2023-07-05 DIAGNOSIS — Z923 Personal history of irradiation: Secondary | ICD-10-CM | POA: Diagnosis not present

## 2023-07-05 DIAGNOSIS — C50812 Malignant neoplasm of overlapping sites of left female breast: Secondary | ICD-10-CM | POA: Insufficient documentation

## 2023-07-05 DIAGNOSIS — I10 Essential (primary) hypertension: Secondary | ICD-10-CM | POA: Diagnosis not present

## 2023-07-05 DIAGNOSIS — Z170 Estrogen receptor positive status [ER+]: Secondary | ICD-10-CM

## 2023-07-05 DIAGNOSIS — C50412 Malignant neoplasm of upper-outer quadrant of left female breast: Secondary | ICD-10-CM | POA: Insufficient documentation

## 2023-07-05 DIAGNOSIS — Z9221 Personal history of antineoplastic chemotherapy: Secondary | ICD-10-CM | POA: Diagnosis not present

## 2023-07-05 DIAGNOSIS — G8929 Other chronic pain: Secondary | ICD-10-CM | POA: Diagnosis not present

## 2023-07-05 DIAGNOSIS — Z800 Family history of malignant neoplasm of digestive organs: Secondary | ICD-10-CM | POA: Diagnosis not present

## 2023-07-05 DIAGNOSIS — Z79811 Long term (current) use of aromatase inhibitors: Secondary | ICD-10-CM | POA: Diagnosis not present

## 2023-07-05 DIAGNOSIS — Z7982 Long term (current) use of aspirin: Secondary | ICD-10-CM | POA: Diagnosis not present

## 2023-07-05 NOTE — Therapy (Signed)
 OUTPATIENT PHYSICAL THERAPY  UPPER EXTREMITY ONCOLOGY EVALUATION  Patient Name: Lynn [name] MRN: [id number] DOB:08/25/1985, 38 y.o., female Today's Date: 07/05/2023  END OF SESSION:  PT End of Session - 07/05/23 0900     Visit Number 1    Number of Visits 9    Date for PT Re-Evaluation 08/09/23    Authorization Type needed    PT Start Time 0805    PT Stop Time 0855    PT Time Calculation (min) 50 min    Activity Tolerance Patient tolerated treatment well    Behavior During Therapy [facility] for tasks assessed/performed             Past Medical History:  Diagnosis Date   Anemia    Cancer (HCC) 2024   L breast   Carpal tunnel syndrome, left    Depression    Diabetes (HCC)    Dysrhythmia    Palpitations   GERD (gastroesophageal reflux disease)    Hypertension    Iron deficiency    Panic attacks    PCOS (polycystic ovarian syndrome)    Personal history of chemotherapy    Pneumonia    Port-A-Cath in place 07/31/2022   Tachycardia    Umbilical hernia    Past Surgical History:  Procedure Laterality Date   ADENOIDECTOMY     As a child   AXILLARY LYMPH NODE DISSECTION Left 11/20/2022   Procedure: LEFT AXILLARY DISSECTION;  Surgeon: Emelia Loron, MD;  Location: [facility] OR;  Service: General;  Laterality: Left;   BREAST BIOPSY Left 05/29/2022   [place] LT BREAST BX W LOC DEV 1ST LESION IMG BX SPEC US GUIDE 05/29/2022 [facility]   BREAST BIOPSY Left 05/29/2022   [place] LT BREAST BX W LOC DEV EA ADD LESION IMG BX SPEC US GUIDE 05/29/2022 [facility]   BREAST BIOPSY Left 06/06/2022   MM LT BREAST BX W LOC DEV 1ST LESION IMAGE BX SPEC STEREO GUIDE 06/06/2022 [facility]   BREAST BIOPSY  11/19/2022   MM LT RADIOACTIVE SEED LOC MAMMO GUIDE 11/19/2022 [facility]   BREAST BIOPSY  11/19/2022   MM LT RADIOACTIVE SEED EA ADD LESION LOC MAMMO GUIDE 11/19/2022 [facility]   BREAST BIOPSY  11/19/2022   MM LT RADIOACTIVE SEED EA ADD LESION LOC MAMMO GUIDE  11/19/2022 [facility]   BREAST LUMPECTOMY WITH RADIOACTIVE SEED AND AXILLARY LYMPH NODE DISSECTION Left 11/20/2022   Procedure: LEFT BREAST LUMPECTOMY WITH RADIOACTIVE SEED;  Surgeon: Emelia Loron, MD;  Location: [facility] OR;  Service: General;  Laterality: Left;  PEC BLOCK   CESAREAN SECTION  2015   PORT-A-CATH REMOVAL Right 11/20/2022   Procedure: REMOVAL PORT-A-CATH;  Surgeon: Emelia Loron, MD;  Location: [facility] OR;  Service: General;  Laterality: Right;   PORTACATH PLACEMENT N/A 06/19/2022   Procedure: INSERTION PORT-A-CATH;  Surgeon: Emelia Loron, MD;  Location: [facility] OR;  Service: General;  Laterality: N/A;   TONSILLECTOMY     As a child   Patient Active Problem List   Diagnosis Date Noted   Breast cancer (HCC) 11/20/2022   Genetic testing 06/15/2022   Anxiety and depression 06/11/2022   Malignant neoplasm of overlapping sites of left breast in female, estrogen receptor positive (HCC) 06/04/2022   Syncope and collapse 03/12/2022   Umbilical hernia without obstruction and without gangrene 03/12/2022   Vitamin D deficiency 03/12/2022   Cervical radicular pain 12/07/2021   Snoring 12/07/2021   Other fatigue 12/07/2021   Chest pain 08/15/2021   Epigastric pain 08/15/2021  Acute pain of both shoulders 04/21/2021   Nerve pain 04/21/2021   Morbid obesity (HCC) 10/13/2020   Essential hypertension 03/12/2019   Chronic tachycardia 03/12/2019   Generalized anxiety disorder with panic attacks 03/12/2019   Family history of heart attack 03/12/2019   LVH (left ventricular hypertrophy) due to hypertensive disease, without heart failure 03/12/2019   Type 2 diabetes mellitus (HCC) 03/12/2019    PCP: Rodman Pickle, NP  REFERRING PROVIDER: Serena Croissant, MD  REFERRING DIAG:  Diagnosis  C50.812,Z17.0 (ICD-10-CM) - Malignant neoplasm of overlapping sites of left breast in female, estrogen receptor positive (HCC)    THERAPY DIAG:  Malignant neoplasm of upper-outer quadrant of  left breast in female, estrogen receptor positive (HCC)  Aftercare following surgery for neoplasm  Lymphedema, not elsewhere classified  Chronic left shoulder pain  ONSET DATE: 05/2022  Rationale for Evaluation and Treatment: Rehabilitation  SUBJECTIVE:                                                                                                                                                                                           SUBJECTIVE STATEMENT: I was sent to symptom management for Left breast and they thought it is lymphedema and radiation recall.  It is starting to get hard and more painful.   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 She has a BMI > 50, hypertension, and diabetes. Left rotator cuff tear; needs surgery but is now postponed. Pt had neoadjuvant chemotherapy and she is s/p a Left lumpectomy with ALND and 0+/14 LN's. Completed radiation 03/25/23. Noted breast pain at [facility].   PAIN:  Are you having pain? Yes NPRS scale: 5/10 Pain location: Left breast - whole breast Pain orientation: Left PAIN TYPE: burning, dull, and sharp Pain description: constant  Aggravating factors: When things rub against it, at the end of the day it gets more swollen.   Relieving factors: Nothing really   PRECAUTIONS: Lt UE lymphedema risk   RED FLAGS: None   WEIGHT BEARING RESTRICTIONS: No  FALLS:  Has patient fallen in last 6 months? No  LIVING ENVIRONMENT: Lives with: lives with their family and lives with their spouse  OCCUPATION: [employer], desk work     LEISURE: nothing now   HAND DOMINANCE: right   PRIOR LEVEL OF FUNCTION: Independent  PATIENT GOALS: decrease the pain   OBJECTIVE: Note: Objective measures were completed at Evaluation unless otherwise noted.  COGNITION: Overall cognitive status: Within functional limits for  tasks assessed   PALPATION: Fibrosis and +2  ttp medial breast, fibrosis lateral breast  with +1 ttp   OBSERVATIONS / OTHER ASSESSMENTS: enlarged pores and peau de orange   POSTURE: rounded shoulders   UPPER EXTREMITY AROM/PROM: WFL  BREAST COMPLAINTS QUESTIONNAIRE    07/05/23 Pain:   8 Heaviness:  6 Swollen feeling: 8 Tense Skin:  8 Redness:  8 Bra Print:  10 Size of Pores:  10 Hard feeling:   10 Total:   68  /80 A Score over 9 indicates lymphedema issues in the breast                                                                                                                             TREATMENT DATE:  With pt permission for breast MLD Education on lymphatic anatomy and drainage patterns of the breast and principles of MLD Performed each step in supine per instruction section below with PT reading and then performing each step and then with hand over hand cueing and performance by pt with modification as needed - noted below.  Pt will get out her compression bra from after surgery and see if it still fits.   PATIENT EDUCATION:  Education details: per today's note Person educated: Patient Education method: Programmer, multimedia, Demonstration, Tactile cues, Verbal cues, and Handouts Education comprehension: verbalized understanding, returned demonstration, and needs further education  HOME EXERCISE PROGRAM: Self MLD, compression  ASSESSMENT:  CLINICAL IMPRESSION: Patient is a 38 y.o. female who was seen today for physical therapy evaluation and treatment for her Left breast lymphedema and new tenderness.  Over the past 1-2 months this has appeared along with some redness so it was attributed to radiation recall and lymphedema.  We started MLD and education on MLD today as well as POC education.     OBJECTIVE IMPAIRMENTS: decreased knowledge of condition, decreased knowledge of use of DME, increased edema, and increased fascial restrictions.   ACTIVITY LIMITATIONS:  sleeping  PARTICIPATION LIMITATIONS: community activity  PERSONAL FACTORS: 1-2 comorbidities: ALND, radiation  are also affecting patient's functional outcome.   REHAB POTENTIAL: Excellent  CLINICAL DECISION MAKING: Evolving/moderate complexity  EVALUATION COMPLEXITY: Low  GOALS: Goals reviewed with patient? Yes  SHORT TERM GOALS: Target date: 08/09/23  Pt will be ind with MLD for the Left breast Baseline:5/10 Goal status: INITIAL  2.  Pt will decrease resting breast pain to 1/10 or less Baseline:  Goal status: INITIAL  3.  Pt will decrease breast edema complaints questionnaire to 45 or less to demonstrate improvements in the breast pain and composition.   Baseline: 68 Goal status: INITIAL   PLAN:  PT FREQUENCY: 2x/week  PT DURATION: 4 weeks  PLANNED INTERVENTIONS: 97110-Therapeutic exercises, 97530- Therapeutic activity, 97535- Self Care, [id number]- Manual therapy, Patient/Family education, Therapeutic exercises, Therapeutic activity, Neuromuscular re-education, Gait training, and Self Care  PLAN FOR NEXT SESSION: Left breast MLD and reviewing self MLD, add foam  Idamae Lusher, PT 07/05/2023, 9:01 AM

## 2023-07-05 NOTE — Progress Notes (Signed)
 Symptom Management Consult Note [facility]    Patient Care Team: Gerre Scull, NP as PCP - General (Internal Medicine) Serena Croissant, MD as Consulting Physician (Hematology and Oncology) Emelia Loron, MD as Consulting Physician (General Surgery) Dorothy Puffer, MD as Consulting Physician (Radiation Oncology)    Name / MRN / DOB: Lynn [name]  [id number]  08/24/1985   Date of visit: 07/05/2023   Chief Complaint/Reason for visit: recheck left breast pain   Current Therapy:  Zoladex, letrozole, verzenio     ASSESSMENT AND PLAN Patient is a 38 y.o. female with oncologic history of malignant neoplasm of overlapping sites of left breast in female, estrogen receptor positive followed by Dr. Pamelia Hoit.  I have viewed most recent oncology note and lab work.  #Malignant neoplasm of overlapping sites of left breast in female, estrogen receptor positive - Next appointment with clinical pharmacist is 08/01/23   #Breast Lymphedema - Reviewed note from PT earlier today. Appreciate recommendations. - Confirmed breast lymphedema with redness, dimpling, and pain. Symptoms exacerbated by radiation recall. Antibiotics minimally effective, indicating lymphedema as primary issue. Compression bra and physical therapy recommended. - Continue wearing compression bra. - Attend physical therapy twice a week for four weeks. - Discontinue antibiotics.    Strict ED precautions discussed should symptoms worsen.   HEME/ONC HISTORY Oncology History  Malignant neoplasm of overlapping sites of left breast in female, estrogen receptor positive (HCC)  05/29/2022 Initial Diagnosis   Palpable abnormalities in the left breast led to mammograms which revealed 3 masses 0.7 cm at 1 o'clock position, 3 cm at 2 o'clock position, additional nodule 8 mm total span 5 cm, 5 abnormal axillary lymph nodes biopsy of the masses and the lymph node were positive for grade 3 IDC with high-grade DCIS  with LVI, ER 95%, PR 40%, Ki-67 60%, HER2 2+ by IHC FISH negative   06/15/2022 Cancer Staging   Staging form: Breast, AJCC 8th Edition - Clinical: Stage IIIB (cT3, cN3, cM0, G3, ER+, PR+, HER2-) - Signed by Loa Socks, NP on 06/15/2022 Stage prefix: Initial diagnosis Histologic grading system: 3 grade system   06/15/2022 Breast MRI   IMPRESSION: 1. Multiple sites of biopsy proven malignancy with in the lateral left breast. The overall conglomerate of suspicious findings spans approximately 4.4 x 3.5 x 3.5 cm, and contains all 3 post biopsy marking clips. 2. 10+ morphologically abnormal level I, II, III and Rotter's lymph nodes within the left axilla. 3. Diffuse skin and trabecular thickening on the left, consistent with lymphovascular spread of disease. 4. No MRI evidence of malignancy on the right.   06/20/2022 - 10/09/2022 Chemotherapy   Patient is on Treatment Plan : BREAST ADJUVANT DOSE DENSE AC q14d / PACLitaxel q7d      Genetic Testing   [facility] Multi-Cancer Panel+RNA was Negative. Report date is 06/13/2022.  The Multi-Cancer + RNA Panel offered by [facility] includes sequencing and/or deletion/duplication analysis of the following 70 genes:  AIP*, ALK, APC*, ATM*, AXIN2*, BAP1*, BARD1*, BLM*, BMPR1A*, BRCA1*, BRCA2*, BRIP1*, CDC73*, CDH1*, CDK4, CDKN1B*, CDKN2A, CHEK2*, CTNNA1*, DICER1*, EPCAM (del/dup only), EGFR, FH*, FLCN*, GREM1 (promoter dup only), HOXB13, KIT, LZTR1, MAX*, MBD4, MEN1*, MET, MITF, MLH1*, MSH2*, MSH3*, MSH6*, MUTYH*, NF1*, NF2*, NTHL1*, PALB2*, PDGFRA, PMS2*, POLD1*, POLE*, POT1*, PRKAR1A*, PTCH1*, PTEN*, RAD51C*, RAD51D*, RB1*, RET, SDHA* (sequencing only), SDHAF2*, SDHB*, SDHC*, SDHD*, SMAD4*, SMARCA4*, SMARCB1*, SMARCE1*, STK11*, SUFU*, TMEM127*, TP53*, TSC1*, TSC2*, VHL*. RNA analysis is performed for * genes.   11/20/2022 Surgery  A. BREAST, LEFT, LUMPECTOMY:  Fibrosis and slight inflammation consistent with treatment effects  No residual invasive  or in situ carcinoma (ypT0)  Margins: All margins negative for carcinoma  Lymphovascular space invasion: Not identified  Biopsy site and biopsy clip  See oncology table   14 LN biopsied negative for cancer.    01/22/2023 - 03/21/2023 Radiation Therapy   Plan Name: Breast_L_BH Site: Breast, Left Technique: 3D Mode: Photon Dose Per Fraction: 1.8 Gy Prescribed Dose (Delivered / Prescribed): 50.4 Gy / 50.4 Gy Prescribed Fxs (Delivered / Prescribed): 28 / 28   Plan Name: Brst_L_SCV_BH Site: Sclav-LT Technique: 3D Mode: Photon Dose Per Fraction: 1.8 Gy Prescribed Dose (Delivered / Prescribed): 50.4 Gy / 50.4 Gy Prescribed Fxs (Delivered / Prescribed): 28 / 28   Plan Name: Brst_L_BH_Bst Site: Breast, Left Technique: 3D Mode: Photon Dose Per Fraction: 2 Gy Prescribed Dose (Delivered / Prescribed): 10 Gy / 10 Gy Prescribed Fxs (Delivered / Prescribed): 5 / 5   04/2023 -  Anti-estrogen oral therapy   Anastrozole       INTERVAL HISTORY  Discussed the use of AI scribe software for clinical note transcription with the patient, who gave verbal consent to proceed.    Lynn [name] is a 38 y.o. female with oncologic history as above presenting to [facility] today with chief complaint of recheck left breast erythema.  She experiences lymphedema in her breast, characterized by dimply skin and a hard, hot-to-the-touch spot, with significant redness by the end of the day and severe pain, even from light contact. She wears a compression bra and has a few antibiotics left, which have helped reduce the dimply and orange skin appearance, though symptoms fluctuate. Pain is primarily in her underarm area and breast, not in her arm.  Patient saw physical therapist today to evaluate for lymphedema. Per patient pysical therapist believes symptoms are more related to lymphedema.  She is on Verzenio and recently switched from anastrozole to letrozole due to headaches. She tolerates Verzenio well without  experiencing diarrhea, a common side effect. She also has a history of nerve pain, for which she takes gabapentin.  No fevers or concerning symptoms beyond those related to her breast and underarm. No swelling in her arm.    ROS  All other systems are reviewed and are negative for acute change except as noted in the HPI.    Allergies  Allergen Reactions   Macrobid [Nitrofurantoin Macrocrystal] Shortness Of Breath and Nausea And Vomiting   Ibuprofen Hives     Past Medical History:  Diagnosis Date   Anemia    Cancer (HCC) 2024   L breast   Carpal tunnel syndrome, left    Depression    Diabetes (HCC)    Dysrhythmia    Palpitations   GERD (gastroesophageal reflux disease)    Hypertension    Iron deficiency    Panic attacks    PCOS (polycystic ovarian syndrome)    Personal history of chemotherapy    Pneumonia    Port-A-Cath in place 07/31/2022   Tachycardia    Umbilical hernia      Past Surgical History:  Procedure Laterality Date   ADENOIDECTOMY     As a child   AXILLARY LYMPH NODE DISSECTION Left 11/20/2022   Procedure: LEFT AXILLARY DISSECTION;  Surgeon: Emelia Loron, MD;  Location: [facility] OR;  Service: General;  Laterality: Left;   BREAST BIOPSY Left 05/29/2022   [place] LT BREAST BX W LOC DEV 1ST LESION IMG BX SPEC US GUIDE 05/29/2022  [facility]   BREAST BIOPSY Left 05/29/2022   [place] LT BREAST BX W LOC DEV EA ADD LESION IMG BX SPEC US GUIDE 05/29/2022 [facility]   BREAST BIOPSY Left 06/06/2022   MM LT BREAST BX W LOC DEV 1ST LESION IMAGE BX SPEC STEREO GUIDE 06/06/2022 [facility]   BREAST BIOPSY  11/19/2022   MM LT RADIOACTIVE SEED LOC MAMMO GUIDE 11/19/2022 [facility]   BREAST BIOPSY  11/19/2022   MM LT RADIOACTIVE SEED EA ADD LESION LOC MAMMO GUIDE 11/19/2022 [facility]   BREAST BIOPSY  11/19/2022   MM LT RADIOACTIVE SEED EA ADD LESION LOC MAMMO GUIDE 11/19/2022 [facility]   BREAST LUMPECTOMY WITH RADIOACTIVE SEED AND  AXILLARY LYMPH NODE DISSECTION Left 11/20/2022   Procedure: LEFT BREAST LUMPECTOMY WITH RADIOACTIVE SEED;  Surgeon: Emelia Loron, MD;  Location: [facility] OR;  Service: General;  Laterality: Left;  PEC BLOCK   CESAREAN SECTION  2015   PORT-A-CATH REMOVAL Right 11/20/2022   Procedure: REMOVAL PORT-A-CATH;  Surgeon: Emelia Loron, MD;  Location: [facility] OR;  Service: General;  Laterality: Right;   PORTACATH PLACEMENT N/A 06/19/2022   Procedure: INSERTION PORT-A-CATH;  Surgeon: Emelia Loron, MD;  Location: [facility] OR;  Service: General;  Laterality: N/A;   TONSILLECTOMY     As a child    Social History   Socioeconomic History   Marital status: Significant Other    Spouse name: Not on file   Number of children: 1   Years of education: some college   Highest education level: Some college, no degree  Occupational History   Not on file  Tobacco Use   Smoking status: Former    Current packs/day: 0.00    Average packs/day: 1 pack/day for 7.0 years (7.0 ttl pk-yrs)    Types: Cigarettes, E-cigarettes    Start date: 03/20/2007    Quit date: 10/29/2021    Years since quitting: 1.6   Smokeless tobacco: Never  Vaping Use   Vaping status: Former   Quit date: 04/03/2019  Substance and Sexual Activity   Alcohol use: Not Currently    Comment: rarely   Drug use: Yes    Types: Marijuana   Sexual activity: Yes    Birth control/protection: Condom  Other Topics Concern   Not on file  Social History Narrative      Social Drivers of Health   Financial Resource Strain: Medium Risk (04/29/2023)   Overall Financial Resource Strain (CARDIA)    Difficulty of Paying Living Expenses: Somewhat hard  Food Insecurity: Food Insecurity Present (04/29/2023)   Hunger Vital Sign    Worried About Running Out of Food in the Last Year: Sometimes true    Ran Out of Food in the Last Year: Sometimes true  Transportation Needs: No Transportation Needs (04/29/2023)   PRAPARE - Administrator, Civil Service  (Medical): No    Lack of Transportation (Non-Medical): No  Physical Activity: Sufficiently Active (04/29/2023)   Exercise Vital Sign    Days of Exercise per Week: 5 days    Minutes of Exercise per Session: 30 min  Stress: Stress Concern Present (04/29/2023)   [employer] of Occupational Health - Occupational Stress Questionnaire    Feeling of Stress : To some extent  Social Connections: Socially Integrated (04/29/2023)   Social Connection and Isolation Panel [NHANES]    Frequency of Communication with Friends and Family: More than three times a week    Frequency of Social Gatherings with Friends and Family: Once a  week    Attends Religious Services: More than 4 times per year    Active Member of Clubs or Organizations: Yes    Attends Banker Meetings: More than 4 times per year    Marital Status: Married  Catering manager Violence: Not At Risk (11/20/2022)   Humiliation, Afraid, Rape, and Kick questionnaire    Fear of Current or Ex-Partner: No    Emotionally Abused: No    Physically Abused: No    Sexually Abused: No    Family History  Problem Relation Age of Onset   Heart disease Mother    Diabetes Mother    Hypertension Mother    Kidney disease Mother    Heart failure Mother    Heart attack Mother 6   Arthritis Mother    Asthma Mother    COPD Mother    Depression Mother    Hyperlipidemia Mother    Mental illness Mother    Bladder Cancer Mother    Heart disease Father    Heart attack Father 24   COPD Father    Hyperlipidemia Father    Hypertension Father    Mental illness Father    Arthritis Sister    Depression Sister    Diabetes Sister    Hypertension Sister    Mental illness Sister    Miscarriages / Stillbirths Sister    Alcohol abuse Sister    Depression Sister    Diabetes Sister    Heart disease Sister    Heart attack Sister 60   Arthritis Sister    Kidney disease Sister    Transient ischemic attack Sister    Bladder Cancer Maternal  Aunt    Colon cancer Maternal Aunt 15 - 37   Other Maternal Aunt 50 - 59       brain tumor (unknown if benign or malignant)   Lung cancer Paternal Aunt 44   Melanoma Paternal Aunt 75 - 49   Heart failure Maternal Grandmother    Arthritis Maternal Grandmother    Alcohol abuse Maternal Grandmother    COPD Maternal Grandmother    Diabetes Maternal Grandmother    Hearing loss Maternal Grandmother    Heart disease Maternal Grandmother    Hyperlipidemia Maternal Grandmother    Hypertension Maternal Grandmother    Stroke Maternal Grandmother    Miscarriages / Stillbirths Maternal Grandmother    Mental illness Maternal Grandmother    Heart attack Maternal Grandmother    Heart failure Maternal Grandfather    Alcohol abuse Maternal Grandfather    Arthritis Maternal Grandfather    Early death Maternal Grandfather    Heart disease Maternal Grandfather    Hyperlipidemia Maternal Grandfather    Hypertension Maternal Grandfather    Heart attack Maternal Grandfather 60   Mental illness Maternal Grandfather    Heart failure Paternal Grandmother    Arthritis Paternal Grandmother    Diabetes Paternal Grandmother    Heart attack Paternal Grandmother    Miscarriages / Stillbirths Paternal Grandmother    Mental illness Paternal Grandmother    Kidney disease Paternal Grandmother    Intellectual disability Paternal Grandmother    Hypertension Paternal Grandmother    Hyperlipidemia Paternal Grandmother    Heart disease Paternal Grandmother    Heart failure Paternal Grandfather    Arthritis Paternal Grandfather    Heart attack Paternal Grandfather    Mental illness Paternal Grandfather    Hearing loss Paternal Grandfather    Heart disease Paternal Grandfather    Hyperlipidemia Paternal Actor  Hypertension Paternal Grandfather      Current Outpatient Medications:    abemaciclib (VERZENIO) 100 MG tablet, Take 1 tablet (100 mg total) by mouth 2 (two) times daily., Disp: 60 tablet, Rfl:  3   ALPRAZolam (XANAX) 0.5 MG tablet, TAKE 1 TABLET BY MOUTH AT BEDTIME AS NEEDED FOR ANXIETY, Disp: 30 tablet, Rfl: 3   aspirin EC 81 MG tablet, Take 81 mg by mouth daily. Swallow whole. (Patient not taking: Reported on 06/06/2023), Disp: , Rfl:    aspirin-acetaminophen-caffeine (EXCEDRIN MIGRAINE) 250-250-65 MG tablet, Take 1-2 tablets by mouth 2 (two) times daily as needed for headache or migraine., Disp: , Rfl:    Blood Glucose Monitoring Suppl (ACCU-CHEK AVIVA PLUS) w/Device KIT, 1 each by Does not apply route daily at 6 (six) AM., Disp: 1 kit, Rfl: 0   carvedilol (COREG) 25 MG tablet, TAKE 1 TABLET(25 MG) BY MOUTH TWICE DAILY WITH A MEAL, Disp: 180 tablet, Rfl: 0   cephALEXin (KEFLEX) 500 MG capsule, Take 2 capsules (1,000 mg total) by mouth every 6 (six) hours for 2 days, THEN 1 capsule (500 mg total) every 6 (six) hours for 12 days., Disp: 64 capsule, Rfl: 0   esomeprazole (NEXIUM) 20 MG capsule, Take 20 mg by mouth daily at 12 noon., Disp: , Rfl:    gabapentin (NEURONTIN) 100 MG capsule, Take 1 capsule (100 mg total) by mouth 3 (three) times daily., Disp: 90 capsule, Rfl: 3   glucose blood (ACCU-CHEK AVIVA PLUS) test strip, Use as instructed, Disp: 100 each, Rfl: 12   letrozole (FEMARA) 2.5 MG tablet, Take 1 tablet (2.5 mg total) by mouth daily., Disp: 90 tablet, Rfl: 3   lidocaine (XYLOCAINE) 2 % jelly, Apply 1 Application topically as needed., Disp: 85 g, Rfl: 0   Menthol, Topical Analgesic, (BIOFREEZE) 10 % CREA, Apply 1 Application topically 3 (three) times daily as needed (pain). (Patient not taking: Reported on 06/06/2023), Disp: , Rfl:    ONE TOUCH CLUB LANCETS MISC, 1 each by Does not apply route daily., Disp: 100 each, Rfl: 11   OZEMPIC, 0.25 OR 0.5 MG/DOSE, 2 MG/3ML SOPN, INJECT 0.5 INTO THE SKIN ONCE A WEEK, Disp: 3 mL, Rfl: 2   prochlorperazine (COMPAZINE) 10 MG tablet, Take 1 tablet (10 mg total) by mouth every 6 (six) hours as needed for nausea or vomiting. (Patient not taking:  Reported on 06/06/2023), Disp: 30 tablet, Rfl: 2  PHYSICAL EXAM ECOG FS:1 - Symptomatic but completely ambulatory    Vitals:   07/05/23 1301  BP: (!) 142/81  Pulse: (!) 101  Resp: 19  Temp: 97.6 F (36.4 C)  TempSrc: Temporal  SpO2: 95%  Weight: (!) 344 lb 1.6 oz (156.1 kg)   Physical Exam Vitals and nursing note reviewed.  Constitutional:      Appearance: She is not ill-appearing or toxic-appearing.  HENT:     Head: Normocephalic.  Eyes:     Conjunctiva/sclera: Conjunctivae normal.  Cardiovascular:     Rate and Rhythm: Regular rhythm. Tachycardia present.  Pulmonary:     Effort: Pulmonary effort is normal.  Chest:     Comments: Faint erythema to left breast. No palpable masses Abdominal:     General: There is no distension.  Musculoskeletal:     Cervical back: Normal range of motion.  Skin:    General: Skin is warm and dry.  Neurological:     Mental Status: She is alert.        LABORATORY DATA I have reviewed the data  as listed    Latest Ref Rng & Units 06/06/2023   10:25 AM 05/09/2023    2:38 PM 04/24/2023    2:47 PM  CBC  WBC 4.0 - 10.5 K/uL 4.0  3.9  3.8   Hemoglobin 12.0 - 15.0 g/dL 16.1  09.6  04.5   Hematocrit 36.0 - 46.0 % 29.7  33.3  35.2   Platelets 150 - 400 K/uL 176  219  217         Latest Ref Rng & Units 06/06/2023   10:25 AM 05/09/2023    2:38 PM 04/24/2023    2:47 PM  CMP  Glucose 70 - 99 mg/dL 409  811  914   BUN 6 - 20 mg/dL 13  16  19    Creatinine 0.44 - 1.00 mg/dL 7.82  9.56  2.13   Sodium 135 - 145 mmol/L 136  134  135   Potassium 3.5 - 5.1 mmol/L 4.4  4.1  4.1   Chloride 98 - 111 mmol/L 103  102  101   CO2 22 - 32 mmol/L 29  26  26    Calcium 8.9 - 10.3 mg/dL 8.8  9.2  9.1   Total Protein 6.5 - 8.1 g/dL 6.9  7.4  7.4   Total Bilirubin 0.0 - 1.2 mg/dL 0.6  0.6  0.5   Alkaline Phos 38 - 126 U/L 56  62  65   AST 15 - 41 U/L 19  21  23    ALT 0 - 44 U/L 22  22  25         RADIOGRAPHIC STUDIES (from last 24 hours if applicable) I  have personally reviewed the radiological images as listed and agreed with the findings in the report. No results found.      Visit Diagnosis: 1. Malignant neoplasm of overlapping sites of left breast in female, estrogen receptor positive (HCC)   2. Lymphedema of breast      No orders of the defined types were placed in this encounter.   All questions were answered. The patient knows to call the clinic with any problems, questions or concerns. No barriers to learning was detected.  A total of more than 10 minutes were spent on this encounter with face-to-face time and non-face-to-face time, including preparing to see the patient, counseling the patient and coordination of care as outlined above.    Thank you for allowing me to participate in the care of this patient.    Shanon Ace, PA-C Department of Hematology/Oncology [facility] at [facility] Phone: [phone]  [phone]    07/05/2023 2:41 PM

## 2023-07-09 ENCOUNTER — Ambulatory Visit

## 2023-07-09 DIAGNOSIS — C50812 Malignant neoplasm of overlapping sites of left female breast: Secondary | ICD-10-CM | POA: Diagnosis not present

## 2023-07-09 DIAGNOSIS — M25512 Pain in left shoulder: Secondary | ICD-10-CM | POA: Diagnosis not present

## 2023-07-09 DIAGNOSIS — C50412 Malignant neoplasm of upper-outer quadrant of left female breast: Secondary | ICD-10-CM | POA: Diagnosis not present

## 2023-07-09 DIAGNOSIS — G8929 Other chronic pain: Secondary | ICD-10-CM | POA: Diagnosis not present

## 2023-07-09 DIAGNOSIS — I890 Lymphedema, not elsewhere classified: Secondary | ICD-10-CM | POA: Diagnosis not present

## 2023-07-09 DIAGNOSIS — Z483 Aftercare following surgery for neoplasm: Secondary | ICD-10-CM

## 2023-07-09 DIAGNOSIS — Z170 Estrogen receptor positive status [ER+]: Secondary | ICD-10-CM

## 2023-07-09 NOTE — Therapy (Signed)
 OUTPATIENT PHYSICAL THERAPY  UPPER EXTREMITY ONCOLOGY TREATMENT  Patient Name: Lynn [name] MRN: [id number] DOB:1985-09-25, 38 y.o., female Today's Date: 07/09/2023  END OF SESSION:  PT End of Session - 07/09/23 0812     Visit Number 2    Number of Visits 9    Date for PT Re-Evaluation 08/09/23    PT Start Time 0809   pt arrived late   PT Stop Time 0859    PT Time Calculation (min) 50 min    Activity Tolerance Patient tolerated treatment well    Behavior During Therapy [facility] for tasks assessed/performed             Past Medical History:  Diagnosis Date   Anemia    Cancer (HCC) 2024   L breast   Carpal tunnel syndrome, left    Depression    Diabetes (HCC)    Dysrhythmia    Palpitations   GERD (gastroesophageal reflux disease)    Hypertension    Iron deficiency    Panic attacks    PCOS (polycystic ovarian syndrome)    Personal history of chemotherapy    Pneumonia    Port-A-Cath in place 07/31/2022   Tachycardia    Umbilical hernia    Past Surgical History:  Procedure Laterality Date   ADENOIDECTOMY     As a child   AXILLARY LYMPH NODE DISSECTION Left 11/20/2022   Procedure: LEFT AXILLARY DISSECTION;  Surgeon: Emelia Loron, MD;  Location: [facility] OR;  Service: General;  Laterality: Left;   BREAST BIOPSY Left 05/29/2022   [place] LT BREAST BX W LOC DEV 1ST LESION IMG BX SPEC US GUIDE 05/29/2022 [facility]   BREAST BIOPSY Left 05/29/2022   [place] LT BREAST BX W LOC DEV EA ADD LESION IMG BX SPEC US GUIDE 05/29/2022 [facility]   BREAST BIOPSY Left 06/06/2022   MM LT BREAST BX W LOC DEV 1ST LESION IMAGE BX SPEC STEREO GUIDE 06/06/2022 [facility]   BREAST BIOPSY  11/19/2022   MM LT RADIOACTIVE SEED LOC MAMMO GUIDE 11/19/2022 [facility]   BREAST BIOPSY  11/19/2022   MM LT RADIOACTIVE SEED EA ADD LESION LOC MAMMO GUIDE 11/19/2022 [facility]   BREAST BIOPSY  11/19/2022   MM LT RADIOACTIVE SEED EA ADD LESION LOC MAMMO GUIDE 11/19/2022 [facility]   BREAST LUMPECTOMY WITH RADIOACTIVE SEED AND AXILLARY LYMPH NODE DISSECTION Left 11/20/2022   Procedure: LEFT BREAST LUMPECTOMY WITH RADIOACTIVE SEED;  Surgeon: Emelia Loron, MD;  Location: [facility] OR;  Service: General;  Laterality: Left;  PEC BLOCK   CESAREAN SECTION  2015   PORT-A-CATH REMOVAL Right 11/20/2022   Procedure: REMOVAL PORT-A-CATH;  Surgeon: Emelia Loron, MD;  Location: [facility] OR;  Service: General;  Laterality: Right;   PORTACATH PLACEMENT N/A 06/19/2022   Procedure: INSERTION PORT-A-CATH;  Surgeon: Emelia Loron, MD;  Location: [facility] OR;  Service: General;  Laterality: N/A;   TONSILLECTOMY     As a child   Patient Active Problem List   Diagnosis Date Noted   Breast cancer (HCC) 11/20/2022   Genetic testing 06/15/2022   Anxiety and depression 06/11/2022   Malignant neoplasm of overlapping sites of left breast in female, estrogen receptor positive (HCC) 06/04/2022   Syncope and collapse 03/12/2022   Umbilical hernia without obstruction and without gangrene 03/12/2022   Vitamin D deficiency 03/12/2022   Cervical radicular pain 12/07/2021   Snoring 12/07/2021   Other fatigue 12/07/2021   Chest pain 08/15/2021   Epigastric pain 08/15/2021   Acute pain  of both shoulders 04/21/2021   Nerve pain 04/21/2021   Morbid obesity (HCC) 10/13/2020   Essential hypertension 03/12/2019   Chronic tachycardia 03/12/2019   Generalized anxiety disorder with panic attacks 03/12/2019   Family history of heart attack 03/12/2019   LVH (left ventricular hypertrophy) due to hypertensive disease, without heart failure 03/12/2019   Type 2 diabetes mellitus (HCC) 03/12/2019    PCP: Rodman Pickle, NP  REFERRING PROVIDER: Serena Croissant, MD  REFERRING DIAG:  Diagnosis  C50.812,Z17.0 (ICD-10-CM) - Malignant neoplasm of overlapping sites of left breast in female, estrogen receptor positive (HCC)    THERAPY DIAG:  Malignant neoplasm of upper-outer quadrant of left breast in  female, estrogen receptor positive (HCC)  Aftercare following surgery for neoplasm  Lymphedema, not elsewhere classified  Chronic left shoulder pain  ONSET DATE: 05/2022  Rationale for Evaluation and Treatment: Rehabilitation  SUBJECTIVE:                                                                                                                                                                                           SUBJECTIVE STATEMENT: I've been doing the self MLD and I can tell it helps the hardness in my breast. The hard area is smaller. My armpit feels really tight today.   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 She has a BMI > 50, hypertension, and diabetes. Left rotator cuff tear; needs surgery but is now postponed. Pt had neoadjuvant chemotherapy and she is s/p a Left lumpectomy with ALND and 0+/14 LN's. Completed radiation 03/25/23. Noted breast pain at [facility].   PAIN:  Are you having pain? Yes NPRS scale: 6/10 Pain location: Left axilla, breast is improving Pain orientation: Left PAIN TYPE: pulling and tight Pain description: constant  Aggravating factors: Lt S/L  Relieving factors: stretching seems to help some  PRECAUTIONS: Lt UE lymphedema risk   RED FLAGS: None   WEIGHT BEARING RESTRICTIONS: No  FALLS:  Has patient fallen in last 6 months? No  LIVING ENVIRONMENT: Lives with: lives with their family and lives with their spouse  OCCUPATION: [employer], desk work     LEISURE: nothing now   HAND DOMINANCE: right   PRIOR LEVEL OF FUNCTION: Independent  PATIENT GOALS: decrease the pain   OBJECTIVE: Note: Objective measures were completed at Evaluation unless otherwise noted.  COGNITION: Overall cognitive status: Within functional limits for tasks assessed   PALPATION: Fibrosis and +2 ttp medial breast, fibrosis  lateral breast  with +1 ttp   OBSERVATIONS / OTHER  ASSESSMENTS: enlarged pores and peau de orange   POSTURE: rounded shoulders   UPPER EXTREMITY AROM/PROM: WFL  BREAST COMPLAINTS QUESTIONNAIRE    07/05/23 Pain:   8 Heaviness:  6 Swollen feeling: 8 Tense Skin:  8 Redness:  8 Bra Print:  10 Size of Pores:  10 Hard feeling:   10 Total:   68  /80 A Score over 9 indicates lymphedema issues in the breast                                                                                                                             TREATMENT DATE:  With pt permission for breast MLD 07/09/23: Manual Therapy MLD to Lt breast as follows: Short neck, superficial and deep abdominals, Lt inguinal nodes and Lt axillo-inguinal anastomosis, Rt axillary and pectoral nodes, anterior intact thorax sequence, anterior inter-axillary anastomosis, then focused on medial and superior breast, next into Rt S/L for focus to lateral and inferior breast redirecting towards lateral anastomosis, then finished retracing all steps in supine reviewing correct pressure and skin stretch with pt. P/ROM to Lt shoulder during MLD into flex, abd and D2 with scapular depression throughout MFR to Lt axilla at areas of tightness  07/05/23: Education on lymphatic anatomy and drainage patterns of the breast and principles of MLD Performed each step in supine per instruction section below with PT reading and then performing each step and then with hand over hand cueing and performance by pt with modification as needed - noted below.  Pt will get out her compression bra from after surgery and see if it still fits.   PATIENT EDUCATION:  Education details: per today's note Person educated: Patient Education method: Programmer, multimedia, Demonstration, Tactile cues, Verbal cues, and Handouts Education comprehension: verbalized understanding, returned demonstration, and needs further education  HOME EXERCISE PROGRAM: Self MLD,  compression  ASSESSMENT:  CLINICAL IMPRESSION: First full session of MLD to Lt breast and incorporated other manual therapies working to decrease Lt upper quadrant tightness. Pt reports she is noticing improvements with breast softening.      OBJECTIVE IMPAIRMENTS: decreased knowledge of condition, decreased knowledge of use of DME, increased edema, and increased fascial restrictions.   ACTIVITY LIMITATIONS: sleeping  PARTICIPATION LIMITATIONS: community activity  PERSONAL FACTORS: 1-2 comorbidities: ALND, radiation  are also affecting patient's functional outcome.   REHAB POTENTIAL: Excellent  CLINICAL DECISION MAKING: Evolving/moderate complexity  EVALUATION COMPLEXITY: Low  GOALS: Goals reviewed with patient? Yes  SHORT TERM GOALS: Target date: 08/09/23  Pt will be ind with MLD for the Left breast Baseline:5/10 Goal status: INITIAL  2.  Pt will decrease resting breast pain to 1/10 or less Baseline:  Goal status: INITIAL  3.  Pt will decrease breast edema complaints questionnaire to 45 or less to demonstrate improvements in the breast pain and composition.   Baseline: 68 Goal status: INITIAL   PLAN:  PT FREQUENCY: 2x/week  PT DURATION: 4 weeks  PLANNED INTERVENTIONS: 97110-Therapeutic exercises, 97530-  Therapeutic activity, 97535- Self Care, 16109- Manual therapy, Patient/Family education, Therapeutic exercises, Therapeutic activity, Neuromuscular re-education, Gait training, and Self Care  PLAN FOR NEXT SESSION: Left breast MLD and reviewing self MLD, add foam  Hermenia Bers, PTA 07/09/2023, 9:04 AM

## 2023-07-11 ENCOUNTER — Encounter: Payer: Self-pay | Admitting: Psychiatry

## 2023-07-11 ENCOUNTER — Encounter: Payer: Self-pay | Admitting: Nurse Practitioner

## 2023-07-11 ENCOUNTER — Other Ambulatory Visit (HOSPITAL_COMMUNITY): Payer: Self-pay

## 2023-07-11 ENCOUNTER — Telehealth: Payer: Self-pay | Admitting: Pharmacy Technician

## 2023-07-11 ENCOUNTER — Encounter: Payer: Self-pay | Admitting: Hematology and Oncology

## 2023-07-11 ENCOUNTER — Ambulatory Visit (INDEPENDENT_AMBULATORY_CARE_PROVIDER_SITE_OTHER): Admitting: Nurse Practitioner

## 2023-07-11 VITALS — BP 158/90 | HR 96 | Temp 97.3°F | Ht 67.0 in | Wt 345.6 lb

## 2023-07-11 DIAGNOSIS — F410 Panic disorder [episodic paroxysmal anxiety] without agoraphobia: Secondary | ICD-10-CM | POA: Diagnosis not present

## 2023-07-11 DIAGNOSIS — E119 Type 2 diabetes mellitus without complications: Secondary | ICD-10-CM | POA: Diagnosis not present

## 2023-07-11 DIAGNOSIS — D649 Anemia, unspecified: Secondary | ICD-10-CM | POA: Diagnosis not present

## 2023-07-11 DIAGNOSIS — R0683 Snoring: Secondary | ICD-10-CM | POA: Diagnosis not present

## 2023-07-11 DIAGNOSIS — E559 Vitamin D deficiency, unspecified: Secondary | ICD-10-CM

## 2023-07-11 DIAGNOSIS — F411 Generalized anxiety disorder: Secondary | ICD-10-CM | POA: Diagnosis not present

## 2023-07-11 DIAGNOSIS — C50812 Malignant neoplasm of overlapping sites of left female breast: Secondary | ICD-10-CM | POA: Diagnosis not present

## 2023-07-11 DIAGNOSIS — Z7985 Long-term (current) use of injectable non-insulin antidiabetic drugs: Secondary | ICD-10-CM | POA: Diagnosis not present

## 2023-07-11 DIAGNOSIS — R000 Tachycardia, unspecified: Secondary | ICD-10-CM | POA: Diagnosis not present

## 2023-07-11 DIAGNOSIS — Z170 Estrogen receptor positive status [ER+]: Secondary | ICD-10-CM | POA: Diagnosis not present

## 2023-07-11 DIAGNOSIS — I10 Essential (primary) hypertension: Secondary | ICD-10-CM

## 2023-07-11 LAB — MICROALBUMIN / CREATININE URINE RATIO
Creatinine,U: 122.9 mg/dL
Microalb Creat Ratio: UNDETERMINED mg/g (ref 0.0–30.0)
Microalb, Ur: <0.7 mg/dL

## 2023-07-11 LAB — LIPID PANEL
Cholesterol: 148 mg/dL (ref 0–200)
HDL: 56.5 mg/dL (ref >39.00)
LDL Cholesterol: 46 mg/dL (ref 0–99)
NonHDL: 91.88
Total CHOL/HDL Ratio: 3
Triglycerides: 231 mg/dL — ABNORMAL HIGH (ref 0.0–149.0)
VLDL: 46.2 mg/dL — ABNORMAL HIGH (ref 0.0–40.0)

## 2023-07-11 LAB — HEMOGLOBIN A1C: Hgb A1c MFr Bld: 6.6 % — ABNORMAL HIGH (ref 4.6–6.5)

## 2023-07-11 LAB — FERRITIN: Ferritin: 58 ng/mL (ref 10.0–291.0)

## 2023-07-11 LAB — VITAMIN B12: Vitamin B-12: >1537 pg/mL — ABNORMAL HIGH (ref 211–911)

## 2023-07-11 LAB — VITAMIN D 25 HYDROXY (VIT D DEFICIENCY, FRACTURES): VITD: 13.32 ng/mL — ABNORMAL LOW (ref 30.00–100.00)

## 2023-07-11 LAB — IRON: Iron: 73 ug/dL (ref 42–145)

## 2023-07-11 MED ORDER — ACCU-CHEK AVIVA PLUS W/DEVICE KIT
1.0000 | PACK | Freq: Every day | 0 refills | Status: AC
  Filled 2023-07-11 (×2): qty 1, 30d supply, fill #0

## 2023-07-11 MED ORDER — VITAMIN D (ERGOCALCIFEROL) 1.25 MG (50000 UNIT) PO CAPS
50000.0000 [IU] | ORAL_CAPSULE | ORAL | 0 refills | Status: DC
Start: 2023-07-11 — End: 2023-10-08
  Filled 2023-07-11: qty 12, 84d supply, fill #0

## 2023-07-11 MED ORDER — ONETOUCH DELICA PLUS LANCET33G MISC
0 refills | Status: AC
  Filled 2023-07-11: qty 100, 90d supply, fill #0

## 2023-07-11 MED ORDER — ESCITALOPRAM OXALATE 10 MG PO TABS
10.0000 mg | ORAL_TABLET | Freq: Every day | ORAL | 2 refills | Status: DC
  Filled 2023-07-11: qty 30, 30d supply, fill #0
  Filled 2023-08-08: qty 30, 30d supply, fill #1

## 2023-07-11 MED ORDER — LISINOPRIL-HYDROCHLOROTHIAZIDE 10-12.5 MG PO TABS
1.0000 | ORAL_TABLET | Freq: Every day | ORAL | 1 refills | Status: DC
  Filled 2023-07-11: qty 30, 30d supply, fill #0
  Filled 2023-08-08: qty 30, 30d supply, fill #1

## 2023-07-11 MED ORDER — ACCU-CHEK GUIDE TEST VI STRP
ORAL_STRIP | 0 refills | Status: DC
  Filled 2023-07-11: qty 50, 50d supply, fill #0

## 2023-07-11 MED ORDER — SEMAGLUTIDE (1 MG/DOSE) 4 MG/3ML ~~LOC~~ SOPN
1.0000 mg | PEN_INJECTOR | SUBCUTANEOUS | 1 refills | Status: DC
  Filled 2023-07-11: qty 3, 28d supply, fill #0
  Filled 2023-08-08: qty 3, 28d supply, fill #1

## 2023-07-11 NOTE — Addendum Note (Signed)
 Addended by: Rodman Pickle A on: 07/11/2023 03:05 PM   Modules accepted: Orders

## 2023-07-11 NOTE — Assessment & Plan Note (Signed)
 Chronic, not controlled. She experiences anxiety and panic attacks. Lexapro was effective in the past and she is willing to restart. Prescribe Lexapro 10 mg daily. Continue Xanax as needed for acute anxiety and sleep. Follow-up in 4-6 weeks.

## 2023-07-11 NOTE — Assessment & Plan Note (Signed)
 BMI is 54.1 She has gained weight while undergoing treatment for breast cancer. Will increase ozempic to 1mg  weekly to help with sugar control and weight.

## 2023-07-11 NOTE — Assessment & Plan Note (Signed)
Check vitamin D and treat based on results.  ?

## 2023-07-11 NOTE — Telephone Encounter (Signed)
 Pharmacy Patient Advocate Encounter   Received notification from CoverMyMeds that prior authorization for Ozempic (1 MG/DOSE) 4MG /3ML pen-injectors is required/requested.   Insurance verification completed.   The patient is insured through [employer] [facility] [employer] .   Per test claim: PA required; PA submitted to above mentioned insurance via CoverMyMeds Key/confirmation #/EOC [id number] Status is pending

## 2023-07-11 NOTE — Assessment & Plan Note (Signed)
 Chronic, stable.  Continue carvedilol 25 mg twice daily.  She needs new referral to cardiology. Referral placed.

## 2023-07-11 NOTE — Assessment & Plan Note (Signed)
 She is undergoing treatment with Zoladex, letrozole, and Verzenio. Lymphedema is managed with physical therapy. Considering laparoscopic removal of tubes and ovaries to eliminate Zoladex due to discomfort. Continue collaboration and recommendations from oncology.

## 2023-07-11 NOTE — Patient Instructions (Addendum)
 It was great to see you!  We are checking your labs today and will let you know the results via mychart/phone.   Increase ozempic to 1 mg injection weekly   Start lisinopril-hydrochlorothiazide 1 tablet daily for your blood pressure  I have placed a referral to cardiology and sleep medicine   Let's follow-up in 4-6 weeks, sooner if you have concerns.  If a referral was placed today, you will be contacted for an appointment. Please note that routine referrals can sometimes take up to 3-4 weeks to process. Please call our office if you haven't heard anything after this time frame.  Take care,  Rodman Pickle, NP

## 2023-07-11 NOTE — Assessment & Plan Note (Signed)
 Chronic, not controlled. Her blood pressure is consistently elevated. Start lisinopril-hctz 10-12.5mg  daily in addition to her coreg 25mg  BID. Recommend home blood pressure monitoring. Follow-up in 4 weeks.

## 2023-07-11 NOTE — Assessment & Plan Note (Signed)
 Noted on labs. Check iron, ferritin, and vitamin B12 today.

## 2023-07-11 NOTE — Assessment & Plan Note (Signed)
 Chronic, stable. Her glucose monitor is not functioning. She is on Ozempic 0.5 mg and willing to increase the dose for weight management. Order a new glucose monitor. Increase Ozempic to 1 mg. Monitor blood glucose levels. Check A1c, lipid panel today.

## 2023-07-11 NOTE — Telephone Encounter (Signed)
 Pharmacy Patient Advocate Encounter  Received notification from [facility] that Prior Authorization for Ozempic (1 MG/DOSE) 4MG /3ML pen-injectors has been APPROVED from 07/11/23 to 07/10/24. Ran test claim, Copay is $977.83. This test claim was processed through [facility]- copay amounts may vary at other pharmacies due to pharmacy/plan contracts, or as the patient moves through the different stages of their insurance plan.   PA #/Case ID/Reference #:  [id number]   *patient has high deductible which is why she has a high copay

## 2023-07-11 NOTE — Progress Notes (Signed)
 Established Patient Office Visit  Subjective   Patient ID: Lynn [name], female    DOB: [birth year]  Age: 38 y.o. MRN: [id number]  Chief Complaint  Patient presents with   Discuss Treatment    Discuss cancer treatment, request lab work, set up cardio visit    HPI  Discussed the use of AI scribe software for clinical note transcription with the patient, who gave verbal consent to proceed.  History of Present Illness   Lynn [name], a patient with a history of breast cancer, presents with multiple concerns related to her ongoing treatment and overall health. She is currently on Zoladex and letrozole, the latter of which was switched from anastrozole due to severe headaches. However, she reports that letrozole may also be causing similar headaches. She is also dealing with lymphedema in the breast, which she describes as painful. She is currently undergoing physical therapy twice a week to manage this condition.  In addition to her cancer-related concerns, Lynn [name] is also dealing with anxiety and panic attacks. She has been prescribed Xanax, which she takes before bed to help manage these symptoms. However, she reports that her anxiety is still present and affecting her daily life. She also mentions a recent death in her family which adds to her anxiety.  Lynn [name] also reports a high heart rate, which she has been monitoring at home. She mentions that her heart rate is usually above 100, even when at rest. She also reports fatigue and a lack of stamina, which she attributes to her cancer treatment and possibly her heart rate. She had a referral to cardiology before her cancer diagnosis, but the provider had to cancel the appointment and then she was busy with cancer treatments.        07/11/2023    9:05 AM 02/18/2023    1:16 PM 06/11/2022    8:33 AM 03/12/2022    8:25 AM 12/07/2021   12:15 PM  Depression screen PHQ 2/9  Decreased Interest 1 0 1 0 1  Down, Depressed, Hopeless 1 0 1 0 1  PHQ - 2  Score 2 0 2 0 2  Altered sleeping 2 1 1  3   Tired, decreased energy 3 1 1  1   Change in appetite 3 1 0  2  Feeling bad or failure about yourself  3 0 2  0  Trouble concentrating 1 0 2  0  Moving slowly or fidgety/restless 0 0 0  0  Suicidal thoughts 0 0 0  0  PHQ-9 Score 14 3 8  8   Difficult doing work/chores Somewhat difficult Not difficult at all Somewhat difficult  Somewhat difficult      07/11/2023    9:05 AM 02/18/2023    1:16 PM 06/11/2022    8:33 AM 10/13/2020   11:38 AM  GAD 7 : Generalized Anxiety Score  Nervous, Anxious, on Edge 3 1 3 2   Control/stop worrying 3 1 3 1   Worry too much - different things 3 1 3 2   Trouble relaxing 2 1 3 2   Restless 0 0 2 1  Easily annoyed or irritable 3 1 2 2   Afraid - awful might happen 3 0 3 2  Total GAD 7 Score 17 5 19 12   Anxiety Difficulty Somewhat difficult Somewhat difficult Somewhat difficult Somewhat difficult      ROS See pertinent positives and negatives per HPI.    Objective:     BP (!) 158/90 (BP Location: Right Arm, Cuff Size: Large)   Pulse 96  Temp (!) 97.3 F (36.3 C)   Ht 5\' 7"  (1.702 m)   Wt (!) 345 lb 9.6 oz (156.8 kg)   SpO2 97%   BMI 54.13 kg/m  BP Readings from Last 3 Encounters:  07/11/23 (!) 158/90  07/05/23 (!) 142/81  07/04/23 (!) 155/89   Wt Readings from Last 3 Encounters:  07/11/23 (!) 345 lb 9.6 oz (156.8 kg)  07/05/23 (!) 344 lb 1.6 oz (156.1 kg)  06/21/23 (!) 347 lb 4.8 oz (157.5 kg)      Physical Exam Vitals and nursing note reviewed.  Constitutional:      General: She is not in acute distress.    Appearance: Normal appearance.  HENT:     Head: Normocephalic.  Eyes:     Conjunctiva/sclera: Conjunctivae normal.  Cardiovascular:     Rate and Rhythm: Normal rate and regular rhythm.     Pulses: Normal pulses.     Heart sounds: Normal heart sounds.  Pulmonary:     Effort: Pulmonary effort is normal.     Breath sounds: Normal breath sounds.  Musculoskeletal:     Cervical back:  Normal range of motion.  Skin:    General: Skin is warm.  Neurological:     General: No focal deficit present.     Mental Status: She is alert and oriented to person, place, and time.  Psychiatric:        Mood and Affect: Mood normal.        Behavior: Behavior normal.        Thought Content: Thought content normal.        Judgment: Judgment normal.      Assessment & Plan:   Problem List Items Addressed This Visit       Cardiovascular and Mediastinum   Essential hypertension - Primary   Chronic, not controlled. Her blood pressure is consistently elevated. Start lisinopril-hctz 10-12.5mg  daily in addition to her coreg 25mg  BID. Recommend home blood pressure monitoring. Follow-up in 4 weeks.       Relevant Medications   lisinopril-hydrochlorothiazide (ZESTORETIC) 10-12.5 MG tablet     Endocrine   Type 2 diabetes mellitus (HCC)   Chronic, stable. Her glucose monitor is not functioning. She is on Ozempic 0.5 mg and willing to increase the dose for weight management. Order a new glucose monitor. Increase Ozempic to 1 mg. Monitor blood glucose levels. Check A1c, lipid panel today.       Relevant Medications   Semaglutide, 1 MG/DOSE, 4 MG/3ML SOPN   lisinopril-hydrochlorothiazide (ZESTORETIC) 10-12.5 MG tablet   Other Relevant Orders   Lipid panel   Hemoglobin A1c   Microalbumin / creatinine urine ratio (Completed)     Other   Chronic tachycardia   Chronic, stable.  Continue carvedilol 25 mg twice daily.  She needs new referral to cardiology. Referral placed.       Relevant Orders   Ambulatory referral to Cardiology   Generalized anxiety disorder with panic attacks   Chronic, not controlled. She experiences anxiety and panic attacks. Lexapro was effective in the past and she is willing to restart. Prescribe Lexapro 10 mg daily. Continue Xanax as needed for acute anxiety and sleep. Follow-up in 4-6 weeks.       Relevant Medications   escitalopram (LEXAPRO) 10 MG tablet    Morbid obesity (HCC)   BMI is 54.1 She has gained weight while undergoing treatment for breast cancer. Will increase ozempic to 1mg  weekly to help with sugar control and weight.  Relevant Medications   Semaglutide, 1 MG/DOSE, 4 MG/3ML SOPN   Snoring   She reports snoring and fatigue and is interested in a home sleep study. Will place referral to sleep medicine.       Relevant Orders   Ambulatory referral to Sleep Studies   Vitamin D deficiency   Check vitamin D and treat based on results.       Relevant Orders   VITAMIN D 25 Hydroxy (Vit-D Deficiency, Fractures)   Malignant neoplasm of overlapping sites of left breast in female, estrogen receptor positive (HCC)   She is undergoing treatment with Zoladex, letrozole, and Verzenio. Lymphedema is managed with physical therapy. Considering laparoscopic removal of tubes and ovaries to eliminate Zoladex due to discomfort. Continue collaboration and recommendations from oncology.      Relevant Medications   goserelin (ZOLADEX) 3.6 MG injection   Anemia   Noted on labs. Check iron, ferritin, and vitamin B12 today.      Relevant Orders   Ferritin   Iron   Vitamin B12     Return in about 4 weeks (around 08/08/2023) for HTN, Anxiety.    Gerre Scull, NP

## 2023-07-11 NOTE — Assessment & Plan Note (Signed)
 She reports snoring and fatigue and is interested in a home sleep study. Will place referral to sleep medicine.

## 2023-07-12 ENCOUNTER — Ambulatory Visit

## 2023-07-12 DIAGNOSIS — G8929 Other chronic pain: Secondary | ICD-10-CM | POA: Diagnosis not present

## 2023-07-12 DIAGNOSIS — Z170 Estrogen receptor positive status [ER+]: Secondary | ICD-10-CM | POA: Diagnosis not present

## 2023-07-12 DIAGNOSIS — I890 Lymphedema, not elsewhere classified: Secondary | ICD-10-CM

## 2023-07-12 DIAGNOSIS — Z483 Aftercare following surgery for neoplasm: Secondary | ICD-10-CM | POA: Diagnosis not present

## 2023-07-12 DIAGNOSIS — C50812 Malignant neoplasm of overlapping sites of left female breast: Secondary | ICD-10-CM | POA: Diagnosis not present

## 2023-07-12 DIAGNOSIS — M25512 Pain in left shoulder: Secondary | ICD-10-CM | POA: Diagnosis not present

## 2023-07-12 DIAGNOSIS — C50412 Malignant neoplasm of upper-outer quadrant of left female breast: Secondary | ICD-10-CM | POA: Diagnosis not present

## 2023-07-12 NOTE — Telephone Encounter (Signed)
 I called and spoke with patient and notified her of below message and she said that she saw the total too and that it was not ran under her correct insurance. Patient had Rx ran under another insurance and was much cheaper.

## 2023-07-12 NOTE — Therapy (Signed)
 OUTPATIENT PHYSICAL THERAPY  UPPER EXTREMITY ONCOLOGY TREATMENT  Patient Name: Lynn [name] MRN: [id number] DOB:April 14, 1985, 38 y.o., female Today's Date: 07/12/2023  END OF SESSION:  PT End of Session - 07/12/23 0814     Visit Number 3    Number of Visits 9    Date for PT Re-Evaluation 08/09/23    PT Start Time 0811    PT Stop Time 0906    PT Time Calculation (min) 55 min    Activity Tolerance Patient tolerated treatment well    Behavior During Therapy [facility] for tasks assessed/performed             Past Medical History:  Diagnosis Date   Anemia    Cancer (HCC) 2024   L breast   Carpal tunnel syndrome, left    Depression    Diabetes (HCC)    Dysrhythmia    Palpitations   GERD (gastroesophageal reflux disease)    Hypertension    Iron deficiency    Panic attacks    PCOS (polycystic ovarian syndrome)    Personal history of chemotherapy    Pneumonia    Port-A-Cath in place 07/31/2022   Tachycardia    Umbilical hernia    Past Surgical History:  Procedure Laterality Date   ADENOIDECTOMY     As a child   AXILLARY LYMPH NODE DISSECTION Left 11/20/2022   Procedure: LEFT AXILLARY DISSECTION;  Surgeon: Emelia Loron, MD;  Location: [facility] OR;  Service: General;  Laterality: Left;   BREAST BIOPSY Left 05/29/2022   [place] LT BREAST BX W LOC DEV 1ST LESION IMG BX SPEC US GUIDE 05/29/2022 [facility]   BREAST BIOPSY Left 05/29/2022   [place] LT BREAST BX W LOC DEV EA ADD LESION IMG BX SPEC US GUIDE 05/29/2022 [facility]   BREAST BIOPSY Left 06/06/2022   MM LT BREAST BX W LOC DEV 1ST LESION IMAGE BX SPEC STEREO GUIDE 06/06/2022 [facility]   BREAST BIOPSY  11/19/2022   MM LT RADIOACTIVE SEED LOC MAMMO GUIDE 11/19/2022 [facility]   BREAST BIOPSY  11/19/2022   MM LT RADIOACTIVE SEED EA ADD LESION LOC MAMMO GUIDE 11/19/2022 [facility]   BREAST BIOPSY  11/19/2022   MM LT RADIOACTIVE SEED EA ADD LESION LOC MAMMO GUIDE 11/19/2022 [facility]   BREAST  LUMPECTOMY WITH RADIOACTIVE SEED AND AXILLARY LYMPH NODE DISSECTION Left 11/20/2022   Procedure: LEFT BREAST LUMPECTOMY WITH RADIOACTIVE SEED;  Surgeon: Emelia Loron, MD;  [facility];  Service: General;  Laterality: Left;  PEC BLOCK   CESAREAN SECTION  2015   PORT-A-CATH REMOVAL Right 11/20/2022   Procedure: REMOVAL PORT-A-CATH;  Surgeon: Emelia Loron, MD;  Location: [facility] OR;  Service: General;  Laterality: Right;   PORTACATH PLACEMENT N/A 06/19/2022   Procedure: INSERTION PORT-A-CATH;  Surgeon: Emelia Loron, MD;  Location: [facility] OR;  Service: General;  Laterality: N/A;   TONSILLECTOMY     As a child   Patient Active Problem List   Diagnosis Date Noted   Anemia 07/11/2023   Breast cancer (HCC) 11/20/2022   Genetic testing 06/15/2022   Anxiety and depression 06/11/2022   Malignant neoplasm of overlapping sites of left breast in female, estrogen receptor positive (HCC) 06/04/2022   Syncope and collapse 03/12/2022   Umbilical hernia without obstruction and without gangrene 03/12/2022   Vitamin D deficiency 03/12/2022   Cervical radicular pain 12/07/2021   Snoring 12/07/2021   Other fatigue 12/07/2021   Chest pain 08/15/2021   Epigastric pain 08/15/2021   Acute pain  of both shoulders 04/21/2021   Nerve pain 04/21/2021   Morbid obesity (HCC) 10/13/2020   Essential hypertension 03/12/2019   Chronic tachycardia 03/12/2019   Generalized anxiety disorder with panic attacks 03/12/2019   Family history of heart attack 03/12/2019   LVH (left ventricular hypertrophy) due to hypertensive disease, without heart failure 03/12/2019   Type 2 diabetes mellitus (HCC) 03/12/2019    PCP: Rodman Pickle, NP  REFERRING PROVIDER: Serena Croissant, MD  REFERRING DIAG:  Diagnosis  C50.812,Z17.0 (ICD-10-CM) - Malignant neoplasm of overlapping sites of left breast in female, estrogen receptor positive (HCC)    THERAPY DIAG:  Malignant neoplasm of upper-outer quadrant of left breast in  female, estrogen receptor positive (HCC)  Aftercare following surgery for neoplasm  Lymphedema, not elsewhere classified  Chronic left shoulder pain  ONSET DATE: 05/2022  Rationale for Evaluation and Treatment: Rehabilitation  SUBJECTIVE:                                                                                                                                                                                           SUBJECTIVE STATEMENT: I've been doing the self MLD and I can tell it helps the hardness in my breast. The hard area is smaller. My armpit feels really tight today.   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 She has a BMI > 50, hypertension, and diabetes. Left rotator cuff tear; needs surgery but is now postponed. Pt had neoadjuvant chemotherapy and she is s/p a Left lumpectomy with ALND and 0+/14 LN's. Completed radiation 03/25/23. Noted breast pain at [facility].   PAIN:  Are you having pain? Yes NPRS scale: 6/10 Pain location: Left axilla, breast is improving Pain orientation: Left PAIN TYPE: pulling and tight Pain description: constant  Aggravating factors: Lt S/L  Relieving factors: stretching seems to help some  PRECAUTIONS: Lt UE lymphedema risk   RED FLAGS: None   WEIGHT BEARING RESTRICTIONS: No  FALLS:  Has patient fallen in last 6 months? No  LIVING ENVIRONMENT: Lives with: lives with their family and lives with their spouse  OCCUPATION: [employer], desk work     LEISURE: nothing now   HAND DOMINANCE: right   PRIOR LEVEL OF FUNCTION: Independent  PATIENT GOALS: decrease the pain   OBJECTIVE: Note: Objective measures were completed at Evaluation unless otherwise noted.  COGNITION: Overall cognitive status: Within functional limits for tasks assessed   PALPATION: Fibrosis and +2 ttp medial breast, fibrosis  lateral breast  with +1 ttp   OBSERVATIONS / OTHER  ASSESSMENTS: enlarged pores and peau de orange   POSTURE: rounded shoulders   UPPER EXTREMITY AROM/PROM: WFL  BREAST COMPLAINTS QUESTIONNAIRE    07/05/23 Pain:   8 Heaviness:  6 Swollen feeling: 8 Tense Skin:  8 Redness:  8 Bra Print:  10 Size of Pores:  10 Hard feeling:   10 Total:   68  /80 A Score over 9 indicates lymphedema issues in the breast                                                                                                                             TREATMENT DATE:  With pt permission for breast MLD 07/12/23: Manual Therapy MLD to Lt breast as follows: Short neck, superficial and deep abdominals, Lt inguinal nodes and Lt axillo-inguinal anastomosis, Rt axillary and pectoral nodes, anterior intact thorax sequence, anterior inter-axillary anastomosis, then focused on medial and superior breast, then finished retracing all steps. P/ROM to Lt shoulder during MLD into flex, abd and D2 with scapular depression throughout, pt still with limited end motions due to fascial restrictions STM to Lt pect insertion and lateral trunk at areas of palpable tightness  07/09/23: Manual Therapy MLD to Lt breast as follows: Short neck, superficial and deep abdominals, Lt inguinal nodes and Lt axillo-inguinal anastomosis, Rt axillary and pectoral nodes, anterior intact thorax sequence, anterior inter-axillary anastomosis, then focused on medial and superior breast, next into Rt S/L for focus to lateral and inferior breast redirecting towards lateral anastomosis, then finished retracing all steps in supine reviewing correct pressure and skin stretch with pt. P/ROM to Lt shoulder during MLD into flex, abd and D2 with scapular depression throughout MFR to Lt axilla at areas of tightness  07/05/23: Education on lymphatic anatomy and drainage patterns of the breast and principles of MLD Performed each step in supine per instruction section  below with PT reading and then performing each step and then with hand over hand cueing and performance by pt with modification as needed - noted below.  Pt will get out her compression bra from after surgery and see if it still fits.   PATIENT EDUCATION:  Education details: per today's note Person educated: Patient Education method: Programmer, multimedia, Demonstration, Tactile cues, Verbal cues, and Handouts Education comprehension: verbalized understanding, returned demonstration, and needs further education  HOME EXERCISE PROGRAM: Self MLD, compression  ASSESSMENT:  CLINICAL IMPRESSION: Pt reports didn't sleep in her compression bra last night as it she had washed it and it was still drying. She could tell an increase in lymphatic fluid at medial breast this morning due to this. Continued with MLD to Lt breast and spent most time focusing on medial breast where pt more fibrotic this morning. Also continued with end Lt shoulder P/ROM and STM working to decrease fascial restrictions due to recent radiation.      OBJECTIVE IMPAIRMENTS: decreased knowledge of condition, decreased knowledge of use of DME, increased edema, and increased fascial restrictions.   ACTIVITY  LIMITATIONS: sleeping  PARTICIPATION LIMITATIONS: community activity  PERSONAL FACTORS: 1-2 comorbidities: ALND, radiation  are also affecting patient's functional outcome.   REHAB POTENTIAL: Excellent  CLINICAL DECISION MAKING: Evolving/moderate complexity  EVALUATION COMPLEXITY: Low  GOALS: Goals reviewed with patient? Yes  SHORT TERM GOALS: Target date: 08/09/23  Pt will be ind with MLD for the Left breast Baseline:5/10 Goal status: INITIAL  2.  Pt will decrease resting breast pain to 1/10 or less Baseline:  Goal status: INITIAL  3.  Pt will decrease breast edema complaints questionnaire to 45 or less to demonstrate improvements in the breast pain and composition.   Baseline: 68 Goal status: INITIAL   PLAN:  PT  FREQUENCY: 2x/week  PT DURATION: 4 weeks  PLANNED INTERVENTIONS: 97110-Therapeutic exercises, 97530- Therapeutic activity, 97535- Self Care, [id number]- Manual therapy, Patient/Family education, Therapeutic exercises, Therapeutic activity, Neuromuscular re-education, Gait training, and Self Care  PLAN FOR NEXT SESSION: Left breast MLD and reviewing self MLD, add foam for medial breast  Hermenia Bers, PTA 07/12/2023, 9:30 AM

## 2023-07-15 ENCOUNTER — Other Ambulatory Visit: Payer: Self-pay

## 2023-07-15 ENCOUNTER — Inpatient Hospital Stay: Payer: BC Managed Care – PPO | Admitting: Psychiatry

## 2023-07-15 ENCOUNTER — Encounter: Payer: Self-pay | Admitting: Psychiatry

## 2023-07-15 VITALS — BP 138/72 | HR 101 | Temp 97.7°F | Resp 18 | Wt 338.6 lb

## 2023-07-15 DIAGNOSIS — Z800 Family history of malignant neoplasm of digestive organs: Secondary | ICD-10-CM | POA: Diagnosis not present

## 2023-07-15 DIAGNOSIS — Z7982 Long term (current) use of aspirin: Secondary | ICD-10-CM | POA: Diagnosis not present

## 2023-07-15 DIAGNOSIS — Z1732 Human epidermal growth factor receptor 2 negative status: Secondary | ICD-10-CM | POA: Diagnosis not present

## 2023-07-15 DIAGNOSIS — C50812 Malignant neoplasm of overlapping sites of left female breast: Secondary | ICD-10-CM | POA: Diagnosis not present

## 2023-07-15 DIAGNOSIS — E282 Polycystic ovarian syndrome: Secondary | ICD-10-CM | POA: Diagnosis not present

## 2023-07-15 DIAGNOSIS — Z79899 Other long term (current) drug therapy: Secondary | ICD-10-CM | POA: Diagnosis not present

## 2023-07-15 DIAGNOSIS — I890 Lymphedema, not elsewhere classified: Secondary | ICD-10-CM | POA: Diagnosis not present

## 2023-07-15 DIAGNOSIS — Z170 Estrogen receptor positive status [ER+]: Secondary | ICD-10-CM

## 2023-07-15 DIAGNOSIS — K219 Gastro-esophageal reflux disease without esophagitis: Secondary | ICD-10-CM | POA: Diagnosis not present

## 2023-07-15 DIAGNOSIS — Z923 Personal history of irradiation: Secondary | ICD-10-CM | POA: Diagnosis not present

## 2023-07-15 DIAGNOSIS — E119 Type 2 diabetes mellitus without complications: Secondary | ICD-10-CM | POA: Diagnosis not present

## 2023-07-15 DIAGNOSIS — K449 Diaphragmatic hernia without obstruction or gangrene: Secondary | ICD-10-CM | POA: Diagnosis not present

## 2023-07-15 DIAGNOSIS — Z9221 Personal history of antineoplastic chemotherapy: Secondary | ICD-10-CM | POA: Diagnosis not present

## 2023-07-15 DIAGNOSIS — Z7985 Long-term (current) use of injectable non-insulin antidiabetic drugs: Secondary | ICD-10-CM | POA: Diagnosis not present

## 2023-07-15 DIAGNOSIS — Z79811 Long term (current) use of aromatase inhibitors: Secondary | ICD-10-CM | POA: Diagnosis not present

## 2023-07-15 DIAGNOSIS — I10 Essential (primary) hypertension: Secondary | ICD-10-CM | POA: Diagnosis not present

## 2023-07-15 NOTE — Patient Instructions (Signed)
 It was a pleasure to see you in clinic today. - We discussed considerations surrounding removal of tubes and ovaries in the setting in hormone positive breast cancer. We also discussed at your age, you may have some resumption of ovarian function after completion of hormone suppression that may be healthy for your bone and heart health. Removal of ovaries is permanent. Encourage you to discuss this further with Dr. Gudena and we can always revisit.  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  I appreciate your confidence in choosing our Gynecologic Oncology team at [facility].  If you have any questions about your visit today please call our office or send us  a MyChart message and we will get back to you as soon as possible.

## 2023-07-15 NOTE — Progress Notes (Signed)
 GYNECOLOGIC ONCOLOGY NEW PATIENT CONSULTATION  Date of Service: 07/15/2023 Referring Provider: Alwin Baars, NP Cameron Cea, MD   ASSESSMENT AND PLAN: Lynn [name] is a 38 y.o. woman with ER/PR+ breast cancer.  Reviewed pt's diagnosis. Also reviewed patient's prior workup for adnexal cystic lesion that on MRI was consistent with a hydrosalpinx.  Patient does report history of pelvic infection following her C-section which may be a risk factor for hydrosalpinx.  A long discussion was held with the patient regarding the rationale for therapeutic removal of the ovaries. She has an estrogen receptor positive breast cancer and her medical oncologist has referred for discussion of possible surgical ablation of ovarian function. We also discussed the pros and cons of removing the uterus.   Given patient's young age of 12, we did review that removal of ovaries is permanent but at her age, following 10 years of hormonal suppression, she may have some resumption of ovarian function that may be valuable for her bone and heart health.  Patient has several risk factors for cardiovascular disease including hypertension, diabetes, obesity, and family history of MI in her father at age 50.  Additionally, in regards to hysterectomy, we did review her potential risk factors for increased complexity including history of C-section and history of pelvic infection following C-section.   At this time, patient would like to consider further and continue on her current medication regimen.  Discussed that we could always revisit and reconsider surgical ablation of ovarian function with BSO.  She will discuss further with Dr. Gudena.  Advised that if she were to have any evidence of bleeding while on hormone suppression, this would be abnormal and would warrant further evaluation.  A copy of this note was sent to the patient's referring provider.  Derrel Flies, MD Gynecologic Oncology   Medical Decision  Making I personally spent  TOTAL 45 minutes face-to-face and non-face-to-face in the care of this patient, which includes all pre, intra, and post visit time on the date of service.   ------------  CC: ER/PR+ breast cancer  HISTORY OF PRESENT ILLNESS:  Lynn [name] is a 38 y.o. 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  Pt was diagnosed with her breast cancer 06/2022. She was treated with NACT, lumpectomy, adjuvant radiation therapy and anti-estrogen therapy with zoladex, anastrazole, and verzenio since 04/2023.   Today, patient reports that she was also noted at time of prior imaging in 2024 to have a cystic area in her adnexa on her staging CT scans.  This was followed up with a pelvic ultrasound on 07/04/2022 which noted a complex cystic area in the left adnexa.  A subsequent MRI was performed on 07/20/2022 which found this to be a mild left hydrosalpinx and no adnexal masses.  Uterus was overall normal with no fibroids and prior C-section scar noted.  Today, patient reports that she had heavy and painful periods when she previously had cycles.  Her periods have since stopped with hormonal suppression.  She has previously had Pap smear screening with her PCP but does not actively follow with a gynecologist.  Does note that she was admitted following her C-section delivery for a subsequent pelvic infection that required IV antibiotics.   TREATMENT HISTORY: Oncology History  Malignant neoplasm of overlapping sites of left breast in female, estrogen receptor positive (HCC)  05/29/2022 Initial Diagnosis   Palpable abnormalities in the left breast led  to mammograms which revealed 3 masses 0.7 cm at 1 o'clock position, 3 cm at 2 o'clock position, additional nodule 8 mm total span 5 cm, 5 abnormal axillary lymph nodes biopsy of the masses and the lymph node were positive for grade 3 IDC with  high-grade DCIS with LVI, ER 95%, PR 40%, Ki-67 60%, HER2 2+ by IHC FISH negative   06/15/2022 Cancer Staging   Staging form: Breast, AJCC 8th Edition - Clinical: Stage IIIB (cT3, cN3, cM0, G3, ER+, PR+, HER2-) - Signed by Loa Socks, NP on 06/15/2022 Stage prefix: Initial diagnosis Histologic grading system: 3 grade system   06/15/2022 Breast MRI   IMPRESSION: 1. Multiple sites of biopsy proven malignancy with in the lateral left breast. The overall conglomerate of suspicious findings spans approximately 4.4 x 3.5 x 3.5 cm, and contains all 3 post biopsy marking clips. 2. 10+ morphologically abnormal level I, II, III and Rotter's lymph nodes within the left axilla. 3. Diffuse skin and trabecular thickening on the left, consistent with lymphovascular spread of disease. 4. No MRI evidence of malignancy on the right.   06/20/2022 - 10/09/2022 Chemotherapy   Patient is on Treatment Plan : BREAST ADJUVANT DOSE DENSE AC q14d / PACLitaxel q7d      Genetic Testing   [facility] Multi-Cancer Panel+RNA was Negative. Report date is 06/13/2022.  The Multi-Cancer + RNA Panel offered by [facility] includes sequencing and/or deletion/duplication analysis of the following 70 genes:  AIP*, ALK, APC*, ATM*, AXIN2*, BAP1*, BARD1*, BLM*, BMPR1A*, BRCA1*, BRCA2*, BRIP1*, CDC73*, CDH1*, CDK4, CDKN1B*, CDKN2A, CHEK2*, CTNNA1*, DICER1*, EPCAM (del/dup only), EGFR, FH*, FLCN*, GREM1 (promoter dup only), HOXB13, KIT, LZTR1, MAX*, MBD4, MEN1*, MET, MITF, MLH1*, MSH2*, MSH3*, MSH6*, MUTYH*, NF1*, NF2*, NTHL1*, PALB2*, PDGFRA, PMS2*, POLD1*, POLE*, POT1*, PRKAR1A*, PTCH1*, PTEN*, RAD51C*, RAD51D*, RB1*, RET, SDHA* (sequencing only), SDHAF2*, SDHB*, SDHC*, SDHD*, SMAD4*, SMARCA4*, SMARCB1*, SMARCE1*, STK11*, SUFU*, TMEM127*, TP53*, TSC1*, TSC2*, VHL*. RNA analysis is performed for * genes.   11/20/2022 Surgery   A. BREAST, LEFT, LUMPECTOMY:  Fibrosis and slight inflammation consistent with treatment effects  No  residual invasive or in situ carcinoma (ypT0)  Margins: All margins negative for carcinoma  Lymphovascular space invasion: Not identified  Biopsy site and biopsy clip  See oncology table   14 LN biopsied negative for cancer.    01/22/2023 - 03/21/2023 Radiation Therapy   Plan Name: Breast_L_BH Site: Breast, Left Technique: 3D Mode: Photon Dose Per Fraction: 1.8 Gy Prescribed Dose (Delivered / Prescribed): 50.4 Gy / 50.4 Gy Prescribed Fxs (Delivered / Prescribed): 28 / 28   Plan Name: Brst_L_SCV_BH Site: Sclav-LT Technique: 3D Mode: Photon Dose Per Fraction: 1.8 Gy Prescribed Dose (Delivered / Prescribed): 50.4 Gy / 50.4 Gy Prescribed Fxs (Delivered / Prescribed): 28 / 28   Plan Name: Brst_L_BH_Bst Site: Breast, Left Technique: 3D Mode: Photon Dose Per Fraction: 2 Gy Prescribed Dose (Delivered / Prescribed): 10 Gy / 10 Gy Prescribed Fxs (Delivered / Prescribed): 5 / 5   04/2023 -  Anti-estrogen oral therapy   Anastrozole     PAST MEDICAL HISTORY: Past Medical History:  Diagnosis Date   Anemia    Cancer (HCC) 2024   L breast   Carpal tunnel syndrome, left    Depression    Diabetes (HCC)    Dysrhythmia    Palpitations   GERD (gastroesophageal reflux disease)    Hypertension    Iron deficiency    Panic attacks    PCOS (polycystic ovarian syndrome)    Personal history of chemotherapy  Pneumonia    Port-A-Cath in place 07/31/2022   Tachycardia    Umbilical hernia     PAST SURGICAL HISTORY: Past Surgical History:  Procedure Laterality Date   ADENOIDECTOMY     As a child   AXILLARY LYMPH NODE DISSECTION Left 11/20/2022   Procedure: LEFT AXILLARY DISSECTION;  Surgeon: Emelia Loron, MD;  Location: [facility] OR;  Service: General;  Laterality: Left;   BREAST BIOPSY Left 05/29/2022   [place] LT BREAST BX W LOC DEV 1ST LESION IMG BX SPEC US GUIDE 05/29/2022 [facility]   BREAST BIOPSY Left 05/29/2022   [place] LT BREAST BX W LOC DEV EA ADD LESION IMG BX SPEC US  GUIDE 05/29/2022 [facility]   BREAST BIOPSY Left 06/06/2022   MM LT BREAST BX W LOC DEV 1ST LESION IMAGE BX SPEC STEREO GUIDE 06/06/2022 [facility]   BREAST BIOPSY  11/19/2022   MM LT RADIOACTIVE SEED LOC MAMMO GUIDE 11/19/2022 [facility]   BREAST BIOPSY  11/19/2022   MM LT RADIOACTIVE SEED EA ADD LESION LOC MAMMO GUIDE 11/19/2022 [facility]   BREAST BIOPSY  11/19/2022   MM LT RADIOACTIVE SEED EA ADD LESION LOC MAMMO GUIDE 11/19/2022 [facility]   BREAST LUMPECTOMY WITH RADIOACTIVE SEED AND AXILLARY LYMPH NODE DISSECTION Left 11/20/2022   Procedure: LEFT BREAST LUMPECTOMY WITH RADIOACTIVE SEED;  Surgeon: Emelia Loron, MD;  Location: [facility] OR;  Service: General;  Laterality: Left;  PEC BLOCK   CESAREAN SECTION  2015   PORT-A-CATH REMOVAL Right 11/20/2022   Procedure: REMOVAL PORT-A-CATH;  Surgeon: Emelia Loron, MD;  Location: [facility] OR;  Service: General;  Laterality: Right;   PORTACATH PLACEMENT N/A 06/19/2022   Procedure: INSERTION PORT-A-CATH;  Surgeon: Emelia Loron, MD;  Location: [facility] OR;  Service: General;  Laterality: N/A;   TONSILLECTOMY     As a child    OB/GYN HISTORY: OB History  Gravida Para Term Preterm AB Living  2 1 1  1    SAB IAB Ectopic Multiple Live Births          # Outcome Date GA Lbr Len/2nd Weight Sex Type Anes PTL Lv  2 AB           1 Term      CS-LTranv         Age at menarche: 76 Age at menopause: 49 (hormonal suppression for breast cancer) Hx of HRT: OCPs in past Hx of STI: no Last pap: 08/09/21 NILM, HPV neg History of abnormal pap smears: may have had one abnormal with subsequent normal biopsy, no CKC or LEEP  SCREENING STUDIES:  Last mammogram: 05/2023 Last colonoscopy: none  MEDICATIONS:  Current Outpatient Medications:    abemaciclib (VERZENIO) 100 MG tablet, Take 1 tablet (100 mg total) by mouth 2 (two) times daily., Disp: 60 tablet, Rfl: 3   ALPRAZolam (XANAX) 0.5 MG tablet, TAKE 1 TABLET BY MOUTH AT  BEDTIME AS NEEDED FOR ANXIETY, Disp: 30 tablet, Rfl: 3   aspirin EC 81 MG tablet, Take 81 mg by mouth daily. Swallow whole., Disp: , Rfl:    aspirin-acetaminophen-caffeine (EXCEDRIN MIGRAINE) 250-250-65 MG tablet, Take 1-2 tablets by mouth 2 (two) times daily as needed for headache or migraine., Disp: , Rfl:    Blood Glucose Monitoring Suppl (ACCU-CHEK AVIVA PLUS) w/Device KIT, Use to check blood sugar once daily at 6 (six) AM., Disp: 1 kit, Rfl: 0   carvedilol (COREG) 25 MG tablet, TAKE 1 TABLET(25 MG) BY MOUTH TWICE DAILY WITH A MEAL, Disp: 180 tablet, Rfl:  0   cyanocobalamin (VITAMIN B12) 1000 MCG tablet, Take 2,000 mcg by mouth daily., Disp: , Rfl:    escitalopram (LEXAPRO) 10 MG tablet, Take 1 tablet (10 mg total) by mouth daily., Disp: 30 tablet, Rfl: 2   esomeprazole (NEXIUM) 20 MG capsule, Take 20 mg by mouth daily at 12 noon., Disp: , Rfl:    gabapentin (NEURONTIN) 100 MG capsule, Take 1 capsule (100 mg total) by mouth 3 (three) times daily., Disp: 90 capsule, Rfl: 3   glucose blood (ACCU-CHEK AVIVA PLUS) test strip, Use as instructed, Disp: 100 each, Rfl: 12   glucose blood (ACCU-CHEK GUIDE TEST) test strip, Use to check blood sugar once a day at 6 am., Disp: 50 strip, Rfl: 0   goserelin (ZOLADEX) 3.6 MG injection, Inject 3.6 mg into the skin every 28 (twenty-eight) days., Disp: , Rfl:    Lancets (ONETOUCH DELICA PLUS LANCET33G) MISC, Use to check blood sugar once a day at 6 am., Disp: 100 each, Rfl: 0   letrozole (FEMARA) 2.5 MG tablet, Take 1 tablet (2.5 mg total) by mouth daily., Disp: 90 tablet, Rfl: 3   lisinopril-hydrochlorothiazide (ZESTORETIC) 10-12.5 MG tablet, Take 1 tablet by mouth daily., Disp: 30 tablet, Rfl: 1   ONE TOUCH CLUB LANCETS MISC, 1 each by Does not apply route daily., Disp: 100 each, Rfl: 11   prochlorperazine (COMPAZINE) 10 MG tablet, Take 1 tablet (10 mg total) by mouth every 6 (six) hours as needed for nausea or vomiting., Disp: 30 tablet, Rfl: 2   Semaglutide,  1 MG/DOSE, 4 MG/3ML SOPN, Inject 1 mg as directed once a week., Disp: 3 mL, Rfl: 1   Vitamin D, Ergocalciferol, (DRISDOL) 1.25 MG (50000 UNIT) CAPS capsule, Take 1 capsule (50,000 Units total) by mouth every 7 (seven) days., Disp: 12 capsule, Rfl: 0  ALLERGIES: Allergies  Allergen Reactions   Macrobid [Nitrofurantoin Macrocrystal] Shortness Of Breath and Nausea And Vomiting   Ibuprofen Hives    FAMILY HISTORY: Family History  Problem Relation Age of Onset   Heart disease Mother    Diabetes Mother    Hypertension Mother    Kidney disease Mother    Heart failure Mother    Heart attack Mother 14   Arthritis Mother    Asthma Mother    COPD Mother    Depression Mother    Hyperlipidemia Mother    Mental illness Mother    Bladder Cancer Mother    Heart disease Father    Heart attack Father 54   COPD Father    Hyperlipidemia Father    Hypertension Father    Mental illness Father    Arthritis Sister    Depression Sister    Diabetes Sister    Hypertension Sister    Mental illness Sister    Miscarriages / Stillbirths Sister    Alcohol abuse Sister    Depression Sister    Diabetes Sister    Heart disease Sister    Heart attack Sister 85   Arthritis Sister    Kidney disease Sister    Transient ischemic attack Sister    Bladder Cancer Maternal Aunt    Colon cancer Maternal Aunt 24 - 26   Other Maternal Aunt 46 - 59       brain tumor (unknown if benign or malignant)   Lung cancer Paternal Aunt 15   Melanoma Paternal Aunt 25 - 49   Heart failure Maternal Grandmother    Arthritis Maternal Grandmother    Alcohol abuse Maternal Grandmother  COPD Maternal Grandmother    Diabetes Maternal Grandmother    Hearing loss Maternal Grandmother    Heart disease Maternal Grandmother    Hyperlipidemia Maternal Grandmother    Hypertension Maternal Grandmother    Stroke Maternal Grandmother    Miscarriages / Stillbirths Maternal Grandmother    Mental illness Maternal Grandmother     Heart attack Maternal Grandmother    Heart failure Maternal Grandfather    Alcohol abuse Maternal Grandfather    Arthritis Maternal Grandfather    Early death Maternal Grandfather    Heart disease Maternal Grandfather    Hyperlipidemia Maternal Grandfather    Hypertension Maternal Grandfather    Heart attack Maternal Grandfather 60   Mental illness Maternal Grandfather    Heart failure Paternal Grandmother    Arthritis Paternal Grandmother    Diabetes Paternal Grandmother    Heart attack Paternal Grandmother    Miscarriages / Stillbirths Paternal Grandmother    Mental illness Paternal Grandmother    Kidney disease Paternal Grandmother    Intellectual disability Paternal Grandmother    Hypertension Paternal Grandmother    Hyperlipidemia Paternal Grandmother    Heart disease Paternal Grandmother    Heart failure Paternal Grandfather    Arthritis Paternal Grandfather    Heart attack Paternal Grandfather    Mental illness Paternal Grandfather    Hearing loss Paternal Grandfather    Heart disease Paternal Grandfather    Hyperlipidemia Paternal Grandfather    Hypertension Paternal Grandfather    Breast cancer Neg Hx    Prostate cancer Neg Hx    Ovarian cancer Neg Hx    Endometrial cancer Neg Hx    Pancreatic cancer Neg Hx     SOCIAL HISTORY: Social History   Socioeconomic History   Marital status: Significant Other    Spouse name: Not on file   Number of children: 1   Years of education: some college   Highest education level: Some college, no degree  Occupational History   Not on file  Tobacco Use   Smoking status: Former    Current packs/day: 0.00    Average packs/day: 1 pack/day for 7.0 years (7.0 ttl pk-yrs)    Types: Cigarettes, E-cigarettes    Start date: 03/20/2007    Quit date: 10/29/2021    Years since quitting: 1.7   Smokeless tobacco: Never  Vaping Use   Vaping status: Former   Quit date: 04/03/2019  Substance and Sexual Activity   Alcohol use: Not  Currently    Comment: rarely   Drug use: Yes    Types: Marijuana   Sexual activity: Yes    Birth control/protection: Condom  Other Topics Concern   Not on file  Social History Narrative      Social Drivers of Health   Financial Resource Strain: Medium Risk (04/29/2023)   Overall Financial Resource Strain (CARDIA)    Difficulty of Paying Living Expenses: Somewhat hard  Food Insecurity: Food Insecurity Present (04/29/2023)   Hunger Vital Sign    Worried About Running Out of Food in the Last Year: Sometimes true    Ran Out of Food in the Last Year: Sometimes true  Transportation Needs: No Transportation Needs (04/29/2023)   PRAPARE - Administrator, Civil Service (Medical): No    Lack of Transportation (Non-Medical): No  Physical Activity: Sufficiently Active (04/29/2023)   Exercise Vital Sign    Days of Exercise per Week: 5 days    Minutes of Exercise per Session: 30 min  Stress: Stress Concern  Present (04/29/2023)   [employer] of Occupational Health - Occupational Stress Questionnaire    Feeling of Stress : To some extent  Social Connections: Socially Integrated (04/29/2023)   Social Connection and Isolation Panel [NHANES]    Frequency of Communication with Friends and Family: More than three times a week    Frequency of Social Gatherings with Friends and Family: Once a week    Attends Religious Services: More than 4 times per year    Active Member of [employer] or Organizations: Yes    Attends Engineer, structural: More than 4 times per year    Marital Status: Married  Catering manager Violence: Not At Risk (11/20/2022)   Humiliation, Afraid, Rape, and Kick questionnaire    Fear of Current or Ex-Partner: No    Emotionally Abused: No    Physically Abused: No    Sexually Abused: No    REVIEW OF SYSTEMS: New patient intake form was reviewed.  Complete 10-system review is negative except for the following: Headache, back pain, anxiety, fatigue, pain with  intercourse, dizziness  PHYSICAL EXAM: BP (!) 148/83 (BP Location: Right Wrist, Patient Position: Sitting)   Pulse (!) 101   Temp 97.7 F (36.5 C) (Oral)   Resp 18   Wt (!) 338 lb 9.6 oz (153.6 kg)   SpO2 97%   BMI 53.03 kg/m  Constitutional: No acute distress. Neuro/Psych: Alert, oriented.  Head and Neck: Normocephalic, atraumatic. Neck symmetric without masses. Sclera anicteric.  Respiratory: Normal work of breathing.  Extremities: Grossly normal range of motion. Warm, well perfused. No edema bilaterally. GU: deferred  LABORATORY AND RADIOLOGIC DATA: Outside medical records were reviewed to synthesize the above history, along with the history and physical obtained during the visit.  Outside laboratory, pathology, and imaging reports were reviewed, with pertinent results below.  I personally reviewed the outside images.  WBC  Date Value Ref Range Status  11/15/2022 6.3 4.0 - 10.5 K/uL Final   WBC Count  Date Value Ref Range Status  06/06/2023 4.0 4.0 - 10.5 K/uL Final   Hemoglobin  Date Value Ref Range Status  06/06/2023 10.3 (L) 12.0 - 15.0 g/dL Final   HCT  Date Value Ref Range Status  06/06/2023 29.7 (L) 36.0 - 46.0 % Final   Platelet Count  Date Value Ref Range Status  06/06/2023 176 150 - 400 K/uL Final   Creatinine  Date Value Ref Range Status  06/06/2023 0.81 0.44 - 1.00 mg/dL Final   AST  Date Value Ref Range Status  06/06/2023 19 15 - 41 U/L Final   ALT  Date Value Ref Range Status  06/06/2023 22 0 - 44 U/L Final   Diagnosis  Date Value Ref Range Status  08/09/2021   Final   - Negative for intraepithelial lesion or malignancy (NILM)   CHL HPV  Date Value Ref Range Status  11/06/2016 Negative  Final   Surgical pathology (05/30/23): Results: IMMUNOHISTOCHEMICAL AND MORPHOMETRIC ANALYSIS PERFORMED MANUALLY The tumor cells are equivocal for Her2 (2+). Her2 by FISH will be performed and the results reported separately. Estrogen Receptor: 95%,  POSITIVE, STRONG STAINING INTENSITY Progesterone Receptor: 40%, POSITIVE, MODERATE-STRONG STAINING INTENSITY Proliferation Marker Ki67: 60%  MR Pelvis W Wo Contrast 07/20/2022  Narrative CLINICAL DATA:  Left adnexal mass.  Metastatic breast carcinoma.  EXAM: MRI PELVIS WITHOUT AND WITH CONTRAST  TECHNIQUE: Multiplanar multisequence MR imaging of the pelvis was performed both before and after administration of intravenous contrast.  CONTRAST:  10 mL Vueway  COMPARISON:  CT on 06/26/2022  FINDINGS: Lower Urinary Tract: No urinary bladder or urethral abnormality identified.  Bowel: Unremarkable pelvic bowel loops.  Vascular/Lymphatic: Unremarkable. No pathologically enlarged pelvic lymph nodes identified.  Reproductive:  -- Uterus: Measures 10.6 x 4.8 x 7.1 cm (volume = 190 cm^3). No fibroids or other masses identified. Prior C-section scar noted. Cervix and vagina are unremarkable.  -- Right ovary: Appears normal. No ovarian or adnexal masses identified.  -- Left ovary: Appears normal. A tubular cystic lesion is seen in the left adnexa adjacent to the ovary which measures 5.0 x 2.8 cm, consistent with mild hydrosalpinx. No other masses identified.  Other: No peritoneal thickening or abnormal free fluid. A small paraumbilical ventral hernia is seen which contains only fat.  Musculoskeletal:  Unremarkable.  IMPRESSION: Mild left hydrosalpinx.  No evidence of ovarian mass.  Normal appearance of uterus.  Small paraumbilical ventral hernia which contains only fat.   Electronically Signed By: Marlyce Sine M.D. On: 07/20/2022 17:15

## 2023-07-16 ENCOUNTER — Ambulatory Visit

## 2023-07-18 ENCOUNTER — Ambulatory Visit: Admitting: Rehabilitation

## 2023-07-18 ENCOUNTER — Encounter: Payer: Self-pay | Admitting: Rehabilitation

## 2023-07-18 DIAGNOSIS — C50412 Malignant neoplasm of upper-outer quadrant of left female breast: Secondary | ICD-10-CM

## 2023-07-18 DIAGNOSIS — Z483 Aftercare following surgery for neoplasm: Secondary | ICD-10-CM

## 2023-07-18 DIAGNOSIS — I890 Lymphedema, not elsewhere classified: Secondary | ICD-10-CM

## 2023-07-18 DIAGNOSIS — M25512 Pain in left shoulder: Secondary | ICD-10-CM | POA: Diagnosis not present

## 2023-07-18 DIAGNOSIS — Z170 Estrogen receptor positive status [ER+]: Secondary | ICD-10-CM | POA: Diagnosis not present

## 2023-07-18 DIAGNOSIS — G8929 Other chronic pain: Secondary | ICD-10-CM

## 2023-07-18 DIAGNOSIS — C50812 Malignant neoplasm of overlapping sites of left female breast: Secondary | ICD-10-CM | POA: Diagnosis not present

## 2023-07-18 NOTE — Therapy (Signed)
 OUTPATIENT PHYSICAL THERAPY  UPPER EXTREMITY ONCOLOGY TREATMENT  Patient Name: Edward Trevino MRN: [id number] DOB:Jan 05, 1986, 38 y.o., female Today's Date: 07/18/2023  END OF SESSION:  PT End of Session - 07/18/23 0858     Visit Number 4    Number of Visits 9    Date for PT Re-Evaluation 08/09/23    PT Start Time 0815    PT Stop Time 0858    PT Time Calculation (min) 43 min    Activity Tolerance Patient tolerated treatment well    Behavior During Therapy [facility] for tasks assessed/performed              Past Medical History:  Diagnosis Date   Anemia    Cancer (HCC) 2024   L breast   Carpal tunnel syndrome, left    Depression    Diabetes (HCC)    Dysrhythmia    Palpitations   GERD (gastroesophageal reflux disease)    Hypertension    Iron deficiency    Panic attacks    PCOS (polycystic ovarian syndrome)    Personal history of chemotherapy    Pneumonia    Port-A-Cath in place 07/31/2022   Tachycardia    Umbilical hernia    Past Surgical History:  Procedure Laterality Date   ADENOIDECTOMY     As a child   AXILLARY LYMPH NODE DISSECTION Left 11/20/2022   Procedure: LEFT AXILLARY DISSECTION;  Surgeon: Emelia Loron, MD;  Location: [facility] OR;  Service: General;  Laterality: Left;   BREAST BIOPSY Left 05/29/2022   [place] LT BREAST BX W LOC DEV 1ST LESION IMG BX SPEC US GUIDE 05/29/2022 [facility]   BREAST BIOPSY Left 05/29/2022   [place] LT BREAST BX W LOC DEV EA ADD LESION IMG BX SPEC US GUIDE 05/29/2022 [facility]   BREAST BIOPSY Left 06/06/2022   MM LT BREAST BX W LOC DEV 1ST LESION IMAGE BX SPEC STEREO GUIDE 06/06/2022 [facility]   BREAST BIOPSY  11/19/2022   MM LT RADIOACTIVE SEED LOC MAMMO GUIDE 11/19/2022 [facility]   BREAST BIOPSY  11/19/2022   MM LT RADIOACTIVE SEED EA ADD LESION LOC MAMMO GUIDE 11/19/2022 [facility]   BREAST BIOPSY  11/19/2022   MM LT RADIOACTIVE SEED EA ADD LESION LOC MAMMO GUIDE 11/19/2022 [facility]    BREAST LUMPECTOMY WITH RADIOACTIVE SEED AND AXILLARY LYMPH NODE DISSECTION Left 11/20/2022   Procedure: LEFT BREAST LUMPECTOMY WITH RADIOACTIVE SEED;  Surgeon: Emelia Loron, MD;  Location: [facility] OR;  Service: General;  Laterality: Left;  PEC BLOCK   CESAREAN SECTION  2015   PORT-A-CATH REMOVAL Right 11/20/2022   Procedure: REMOVAL PORT-A-CATH;  Surgeon: Emelia Loron, MD;  Location: [facility] OR;  Service: General;  Laterality: Right;   PORTACATH PLACEMENT N/A 06/19/2022   Procedure: INSERTION PORT-A-CATH;  Surgeon: Emelia Loron, MD;  Location: [facility] OR;  Service: General;  Laterality: N/A;   TONSILLECTOMY     As a child   Patient Active Problem List   Diagnosis Date Noted   Anemia 07/11/2023   Breast cancer (HCC) 11/20/2022   Genetic testing 06/15/2022   Anxiety and depression 06/11/2022   Malignant neoplasm of overlapping sites of left breast in female, estrogen receptor positive (HCC) 06/04/2022   Syncope and collapse 03/12/2022   Umbilical hernia without obstruction and without gangrene 03/12/2022   Vitamin D deficiency 03/12/2022   Cervical radicular pain 12/07/2021   Snoring 12/07/2021   Other fatigue 12/07/2021   Chest pain 08/15/2021   Epigastric pain 08/15/2021   Acute  pain of both shoulders 04/21/2021   Nerve pain 04/21/2021   Morbid obesity (HCC) 10/13/2020   Essential hypertension 03/12/2019   Chronic tachycardia 03/12/2019   Generalized anxiety disorder with panic attacks 03/12/2019   Family history of heart attack 03/12/2019   LVH (left ventricular hypertrophy) due to hypertensive disease, without heart failure 03/12/2019   Type 2 diabetes mellitus (HCC) 03/12/2019    PCP: Rheba Cedar, NP  REFERRING PROVIDER: Cameron Cea, MD  REFERRING DIAG:  Diagnosis  C50.812,Z17.0 (ICD-10-CM) - Malignant neoplasm of overlapping sites of left breast in female, estrogen receptor positive (HCC)    THERAPY DIAG:  Malignant neoplasm of upper-outer quadrant of left  breast in female, estrogen receptor positive (HCC)  Aftercare following surgery for neoplasm  Lymphedema, not elsewhere classified  Chronic left shoulder pain  ONSET DATE: 05/2022  Rationale for Evaluation and Treatment: Rehabilitation  SUBJECTIVE:                                                                                                                                                                                           SUBJECTIVE STATEMENT: Nothing new  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 She has a BMI > 50, hypertension, and diabetes. Left rotator cuff tear; needs surgery but is now postponed. Pt had neoadjuvant chemotherapy and she is s/p a Left lumpectomy with ALND and 0+/14 LN's. Completed radiation 03/25/23. Noted breast pain at [place].   PAIN:  Are you having pain? Yes NPRS scale: 6/10 Pain location: Left axilla, breast is improving Pain orientation: Left PAIN TYPE: pulling and tight Pain description: constant  Aggravating factors: Lt S/L  Relieving factors: stretching seems to help some  PRECAUTIONS: Lt UE lymphedema risk   RED FLAGS: None   WEIGHT BEARING RESTRICTIONS: No  FALLS:  Has patient fallen in last 6 months? No  LIVING ENVIRONMENT: Lives with: lives with their family and lives with their spouse  OCCUPATION: [employer], desk work     LEISURE: nothing now   HAND DOMINANCE: right   PRIOR LEVEL OF FUNCTION: Independent  PATIENT GOALS: decrease the pain   OBJECTIVE: Note: Objective measures were completed at Evaluation unless otherwise noted.  COGNITION: Overall cognitive status: Within functional limits for tasks assessed   PALPATION: Fibrosis and +2 ttp medial breast, fibrosis lateral breast  with +1 ttp   OBSERVATIONS / OTHER ASSESSMENTS: enlarged pores and peau de orange   POSTURE: rounded  shoulders   UPPER EXTREMITY AROM/PROM: WFL  BREAST COMPLAINTS QUESTIONNAIRE    07/05/23  Pain:   8 Heaviness:  6 Swollen feeling: 8 Tense Skin:  8 Redness:  8 Bra Print:  10 Size of Pores:  10 Hard feeling:   10 Total:   68  /80 A Score over 9 indicates lymphedema issues in the breast                                                                                                                             TREATMENT DATE:  With pt permission for breast MLD 07/18/23: Manual Therapy MLD to Lt breast as follows: Short neck, superficial and deep abdominals, Lt inguinal nodes and Lt axillo-inguinal anastomosis, Rt axillary and pectoral nodes, anterior intact thorax sequence, anterior inter-axillary anastomosis, then focused on medial and superior breast, then finished retracing all steps. P/ROM to Lt shoulder during MLD into flex, abd and D2 with scapular depression throughout, pt still with limited end motions due to fascial restrictions  07/12/23: Manual Therapy MLD to Lt breast as follows: Short neck, superficial and deep abdominals, Lt inguinal nodes and Lt axillo-inguinal anastomosis, Rt axillary and pectoral nodes, anterior intact thorax sequence, anterior inter-axillary anastomosis, then focused on medial and superior breast, then finished retracing all steps. P/ROM to Lt shoulder during MLD into flex, abd and D2 with scapular depression throughout, pt still with limited end motions due to fascial restrictions STM to Lt pect insertion and lateral trunk at areas of palpable tightness  07/09/23: Manual Therapy MLD to Lt breast as follows: Short neck, superficial and deep abdominals, Lt inguinal nodes and Lt axillo-inguinal anastomosis, Rt axillary and pectoral nodes, anterior intact thorax sequence, anterior inter-axillary anastomosis, then focused on medial and superior breast, next into Rt S/L for focus to lateral and inferior breast redirecting towards lateral anastomosis, then finished  retracing all steps in supine reviewing correct pressure and skin stretch with pt. P/ROM to Lt shoulder during MLD into flex, abd and D2 with scapular depression throughout MFR to Lt axilla at areas of tightness  07/05/23: Education on lymphatic anatomy and drainage patterns of the breast and principles of MLD Performed each step in supine per instruction section below with PT reading and then performing each step and then with hand over hand cueing and performance by pt with modification as needed - noted below.  Pt will get out her compression bra from after surgery and see if it still fits.   PATIENT EDUCATION:  Education details: per today's note Person educated: Patient Education method: Programmer, multimedia, Demonstration, Tactile cues, Verbal cues, and Handouts Education comprehension: verbalized understanding, returned demonstration, and needs further education  HOME EXERCISE PROGRAM: Self MLD, compression  ASSESSMENT:  CLINICAL IMPRESSION: Continued with MLD to Lt breast and spent most time focusing on medial breast where pt more fibrotic this morning. Also continued with end Lt shoulder P/ROM and STM working to decrease fascial restrictions due to recent radiation.      OBJECTIVE IMPAIRMENTS: decreased knowledge of condition, decreased knowledge of use of DME,  increased edema, and increased fascial restrictions.   ACTIVITY LIMITATIONS: sleeping  PARTICIPATION LIMITATIONS: community activity  PERSONAL FACTORS: 1-2 comorbidities: ALND, radiation  are also affecting patient's functional outcome.   REHAB POTENTIAL: Excellent  CLINICAL DECISION MAKING: Evolving/moderate complexity  EVALUATION COMPLEXITY: Low  GOALS: Goals reviewed with patient? Yes  SHORT TERM GOALS: Target date: 08/09/23  Pt will be ind with MLD for the Left breast Baseline:5/10 Goal status: INITIAL  2.  Pt will decrease resting breast pain to 1/10 or less Baseline:  Goal status: INITIAL  3.  Pt will  decrease breast edema complaints questionnaire to 45 or less to demonstrate improvements in the breast pain and composition.   Baseline: 68 Goal status: INITIAL   PLAN:  PT FREQUENCY: 2x/week  PT DURATION: 4 weeks  PLANNED INTERVENTIONS: 97110-Therapeutic exercises, 97530- Therapeutic activity, 97535- Self Care, 82956- Manual therapy, Patient/Family education, Therapeutic exercises, Therapeutic activity, Neuromuscular re-education, Gait training, and Self Care  PLAN FOR NEXT SESSION: Left breast MLD and reviewing self MLD, add foam for medial breast  Encarnacion Harris, PT 07/18/2023, 8:58 AM

## 2023-07-19 ENCOUNTER — Other Ambulatory Visit: Payer: Self-pay

## 2023-07-22 ENCOUNTER — Other Ambulatory Visit: Payer: Self-pay

## 2023-07-22 ENCOUNTER — Other Ambulatory Visit: Payer: Self-pay | Admitting: Nurse Practitioner

## 2023-07-22 ENCOUNTER — Other Ambulatory Visit (HOSPITAL_COMMUNITY): Payer: Self-pay

## 2023-07-22 ENCOUNTER — Encounter: Payer: Self-pay | Admitting: Hematology and Oncology

## 2023-07-22 ENCOUNTER — Other Ambulatory Visit: Payer: Self-pay | Admitting: Hematology and Oncology

## 2023-07-22 DIAGNOSIS — R000 Tachycardia, unspecified: Secondary | ICD-10-CM

## 2023-07-22 DIAGNOSIS — I10 Essential (primary) hypertension: Secondary | ICD-10-CM

## 2023-07-22 MED ORDER — CARVEDILOL 25 MG PO TABS
25.0000 mg | ORAL_TABLET | Freq: Two times a day (BID) | ORAL | 0 refills | Status: DC
  Filled 2023-07-22 – 2023-11-06 (×2): qty 180, 90d supply, fill #0

## 2023-07-22 MED ORDER — CARVEDILOL 25 MG PO TABS
25.0000 mg | ORAL_TABLET | Freq: Two times a day (BID) | ORAL | 0 refills | Status: DC
  Filled 2023-07-22 – 2023-08-08 (×2): qty 180, 90d supply, fill #0

## 2023-07-22 NOTE — Progress Notes (Signed)
 Specialty Pharmacy Ongoing Clinical Assessment Note  Lynn [name] is a 38 y.o. female who is being followed by the specialty pharmacy service for RxSp Oncology   Patient's specialty medication(s) reviewed today: Abemaciclib  (VERZENIO )   Missed doses in the last 4 weeks: 0   Patient/Caregiver did not have any additional questions or concerns.   Therapeutic benefit summary: Patient is achieving benefit   Adverse events/side effects summary: No adverse events/side effects   Patient's therapy is appropriate to: Continue    Goals Addressed             This Visit's Progress    Maintain optimal adherence to therapy   On track    Patient is on track. Patient will maintain adherence          Follow up:  3 months  Lynn [name] M Koraline Phillipson Specialty Pharmacist

## 2023-07-22 NOTE — Progress Notes (Signed)
 Specialty Pharmacy Refill Coordination Note  Lynn [name] is a 38 y.o. female contacted today regarding refills of specialty medication(s) Abemaciclib  (VERZENIO )   Patient requested Delivery   Delivery date: 07/30/23   Verified address: Patient address [address]  [place]   Medication will be filled on 07/29/23.

## 2023-07-23 ENCOUNTER — Other Ambulatory Visit: Payer: Self-pay

## 2023-07-23 ENCOUNTER — Other Ambulatory Visit (HOSPITAL_COMMUNITY): Payer: Self-pay

## 2023-07-23 ENCOUNTER — Ambulatory Visit

## 2023-07-23 DIAGNOSIS — M25512 Pain in left shoulder: Secondary | ICD-10-CM | POA: Diagnosis not present

## 2023-07-23 DIAGNOSIS — I890 Lymphedema, not elsewhere classified: Secondary | ICD-10-CM

## 2023-07-23 DIAGNOSIS — Z170 Estrogen receptor positive status [ER+]: Secondary | ICD-10-CM | POA: Diagnosis not present

## 2023-07-23 DIAGNOSIS — G8929 Other chronic pain: Secondary | ICD-10-CM

## 2023-07-23 DIAGNOSIS — C50412 Malignant neoplasm of upper-outer quadrant of left female breast: Secondary | ICD-10-CM

## 2023-07-23 DIAGNOSIS — Z483 Aftercare following surgery for neoplasm: Secondary | ICD-10-CM

## 2023-07-23 DIAGNOSIS — C50812 Malignant neoplasm of overlapping sites of left female breast: Secondary | ICD-10-CM | POA: Diagnosis not present

## 2023-07-23 MED ORDER — ABEMACICLIB 100 MG PO TABS
100.0000 mg | ORAL_TABLET | Freq: Two times a day (BID) | ORAL | 3 refills | Status: DC
  Filled 2023-07-23: qty 56, 28d supply, fill #0

## 2023-07-23 NOTE — Therapy (Signed)
 OUTPATIENT PHYSICAL THERAPY  UPPER EXTREMITY ONCOLOGY TREATMENT  Patient Name: Lynn [name] MRN: [id number] DOB:1985-08-24, 38 y.o., female Today's Date: 07/23/2023  END OF SESSION:  PT End of Session - 07/23/23 0821     Visit Number 5    Number of Visits 9    Date for PT Re-Evaluation 08/09/23    Authorization Type needed    PT Start Time 0818   pt arrived late   PT Stop Time 0912    PT Time Calculation (min) 54 min    Activity Tolerance Patient tolerated treatment well    Behavior During Therapy [facility] for tasks assessed/performed              Past Medical History:  Diagnosis Date   Anemia    Cancer (HCC) 2024   L breast   Carpal tunnel syndrome, left    Depression    Diabetes (HCC)    Dysrhythmia    Palpitations   GERD (gastroesophageal reflux disease)    Hypertension    Iron deficiency    Panic attacks    PCOS (polycystic ovarian syndrome)    Personal history of chemotherapy    Pneumonia    Port-A-Cath in place 07/31/2022   Tachycardia    Umbilical hernia    Past Surgical History:  Procedure Laterality Date   ADENOIDECTOMY     As a child   AXILLARY LYMPH NODE DISSECTION Left 11/20/2022   Procedure: LEFT AXILLARY DISSECTION;  Surgeon: Enid Harry, MD;  Location: [facility] OR;  Service: General;  Laterality: Left;   BREAST BIOPSY Left 05/29/2022   US  LT BREAST BX W LOC DEV 1ST LESION IMG BX SPEC US  GUIDE 05/29/2022 [facility]   BREAST BIOPSY Left 05/29/2022   US  LT BREAST BX W LOC DEV EA ADD LESION IMG BX SPEC US  GUIDE 05/29/2022 [facility]   BREAST BIOPSY Left 06/06/2022   MM LT BREAST BX W LOC DEV 1ST LESION IMAGE BX SPEC STEREO GUIDE 06/06/2022 [facility]   BREAST BIOPSY  11/19/2022   MM LT RADIOACTIVE SEED LOC MAMMO GUIDE 11/19/2022 [facility]   BREAST BIOPSY  11/19/2022   MM LT RADIOACTIVE SEED EA ADD LESION LOC MAMMO GUIDE 11/19/2022 [facility]   BREAST BIOPSY  11/19/2022   MM LT RADIOACTIVE SEED EA ADD LESION LOC  MAMMO GUIDE 11/19/2022 [facility]   BREAST LUMPECTOMY WITH RADIOACTIVE SEED AND AXILLARY LYMPH NODE DISSECTION Left 11/20/2022   Procedure: LEFT BREAST LUMPECTOMY WITH RADIOACTIVE SEED;  Surgeon: Enid Harry, MD;  Location: [facility] OR;  Service: General;  Laterality: Left;  PEC BLOCK   CESAREAN SECTION  2015   PORT-A-CATH REMOVAL Right 11/20/2022   Procedure: REMOVAL PORT-A-CATH;  Surgeon: Enid Harry, MD;  Location: [facility] OR;  Service: General;  Laterality: Right;   PORTACATH PLACEMENT N/A 06/19/2022   Procedure: INSERTION PORT-A-CATH;  Surgeon: Enid Harry, MD;  Location: [facility] OR;  Service: General;  Laterality: N/A;   TONSILLECTOMY     As a child   Patient Active Problem List   Diagnosis Date Noted   Anemia 07/11/2023   Breast cancer (HCC) 11/20/2022   Genetic testing 06/15/2022   Anxiety and depression 06/11/2022   Malignant neoplasm of overlapping sites of left breast in female, estrogen receptor positive (HCC) 06/04/2022   Syncope and collapse 03/12/2022   Umbilical hernia without obstruction and without gangrene 03/12/2022   Vitamin D  deficiency 03/12/2022   Cervical radicular pain 12/07/2021   Snoring 12/07/2021   Other fatigue 12/07/2021   Chest  pain 08/15/2021   Epigastric pain 08/15/2021   Acute pain of both shoulders 04/21/2021   Nerve pain 04/21/2021   Morbid obesity (HCC) 10/13/2020   Essential hypertension 03/12/2019   Chronic tachycardia 03/12/2019   Generalized anxiety disorder with panic attacks 03/12/2019   Family history of heart attack 03/12/2019   LVH (left ventricular hypertrophy) due to hypertensive disease, without heart failure 03/12/2019   Type 2 diabetes mellitus (HCC) 03/12/2019    PCP: Rheba Cedar, NP  REFERRING PROVIDER: Cameron Cea, MD  REFERRING DIAG:  Diagnosis  C50.812,Z17.0 (ICD-10-CM) - Malignant neoplasm of overlapping sites of left breast in female, estrogen receptor positive (HCC)    THERAPY DIAG:  Malignant  neoplasm of upper-outer quadrant of left breast in female, estrogen receptor positive (HCC)  Aftercare following surgery for neoplasm  Lymphedema, not elsewhere classified  Chronic left shoulder pain  ONSET DATE: 05/2022  Rationale for Evaluation and Treatment: Rehabilitation  SUBJECTIVE:                                                                                                                                                                                           SUBJECTIVE STATEMENT: My Lt breast feels spongy and heavy today. I was busy over the weekend cooking for Easter and didn't get to do my self MLD  as much.   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 She has a BMI > 50, hypertension, and diabetes. Left rotator cuff tear; needs surgery but is now postponed. Pt had neoadjuvant chemotherapy and she is s/p a Left lumpectomy with ALND and 0+/14 LN's. Completed radiation 03/25/23. Noted breast pain at [facility].   PAIN:  Are you having pain?No, pt did not rate today. Just reports heaviness in Lt breast  PRECAUTIONS: Lt UE lymphedema risk   RED FLAGS: None   WEIGHT BEARING RESTRICTIONS: No  FALLS:  Has patient fallen in last 6 months? No  LIVING ENVIRONMENT: Lives with: lives with their family and lives with their spouse  OCCUPATION: [employer], desk work     LEISURE: nothing now   HAND DOMINANCE: right   PRIOR LEVEL OF FUNCTION: Independent  PATIENT GOALS: decrease the pain   OBJECTIVE: Note: Objective measures were completed at Evaluation unless otherwise noted.  COGNITION: Overall cognitive status: Within functional limits for tasks assessed   PALPATION: Fibrosis and +2 ttp medial breast, fibrosis lateral breast  with +1 ttp   OBSERVATIONS / OTHER ASSESSMENTS: enlarged pores and peau de orange   POSTURE: rounded shoulders  UPPER EXTREMITY AROM/PROM: WFL  BREAST COMPLAINTS QUESTIONNAIRE    07/05/23 Pain:   8 Heaviness:  6 Swollen feeling: 8 Tense Skin:  8 Redness:  8 Bra Print:  10 Size of Pores:  10 Hard feeling:   10 Total:   68  /80 A Score over 9 indicates lymphedema issues in the breast                                                                                                                             TREATMENT DATE:  With pt permission for breast MLD 07/23/23: Manual Therapy Made and issued different compression foam options for day and night wear in compression bra: 2 separate pieces of small dotted peach medi foam against 1/4" gray foam placed in thin stockinette for pt to wear at medial and lateral breast, then a chip pack that pt can wear at inferior breast at night. She reports day pieces comfortable upon donning at end of session.  MLD to Lt breast as follows: Short neck, superficial and deep abdominals, Lt inguinal nodes and Lt axillo-inguinal anastomosis, Rt axillary and pectoral nodes, anterior intact thorax sequence, anterior inter-axillary anastomosis, then focused on medial and superior breast, next into Rt S/L for focus to lateral breast redirecting towards lateral anastomosis, then finished retracing all steps in supine.  P/ROM to Lt shoulder during MLD into flex, abd and D2 with scapular depression throughout, pt still with limited end motions due to fascial restrictions but this did seem some improved today STM to Lt axilla and lateral trunk where pt still palpably tight but this does seem improved today.    07/18/23: Manual Therapy MLD to Lt breast as follows: Short neck, superficial and deep abdominals, Lt inguinal nodes and Lt axillo-inguinal anastomosis, Rt axillary and pectoral nodes, anterior intact thorax sequence, anterior inter-axillary anastomosis, then focused on medial and superior breast, then finished retracing all steps. P/ROM to Lt shoulder during MLD into flex,  abd and D2 with scapular depression throughout, pt still with limited end motions due to fascial restrictions  07/12/23: Manual Therapy MLD to Lt breast as follows: Short neck, superficial and deep abdominals, Lt inguinal nodes and Lt axillo-inguinal anastomosis, Rt axillary and pectoral nodes, anterior intact thorax sequence, anterior inter-axillary anastomosis, then focused on medial and superior breast, then finished retracing all steps. P/ROM to Lt shoulder during MLD into flex, abd and D2 with scapular depression throughout, pt still with limited end motions due to fascial restrictions STM to Lt pect insertion and lateral trunk at areas of palpable tightness      PATIENT EDUCATION:  Education details: per today's note Person educated: Patient Education method: Programmer, multimedia, Demonstration, Tactile cues, Verbal cues, and Handouts Education comprehension: verbalized understanding, returned demonstration, and needs further education  HOME EXERCISE PROGRAM: Self MLD, compression  ASSESSMENT:  CLINICAL IMPRESSION: Made and issued varying compression foam options for pt to try wearing in her bra at day and night, see above. Then  continued with MLD to Lt breast. Peau d'orange still present medially but was also more visible lateral to areolar where pt reports breast feeling spongy today and heavier in general. Also continued with end P/ROM stretching which does seem improved from when this therapist last saw her. Encouraged pt to cont with self MLD, even if it's a few mins at her desk during work day across anastomosis, pt verbalized understanding.    OBJECTIVE IMPAIRMENTS: decreased knowledge of condition, decreased knowledge of use of DME, increased edema, and increased fascial restrictions.   ACTIVITY LIMITATIONS: sleeping  PARTICIPATION LIMITATIONS: community activity  PERSONAL FACTORS: 1-2 comorbidities: ALND, radiation  are also affecting patient's functional outcome.   REHAB  POTENTIAL: Excellent  CLINICAL DECISION MAKING: Evolving/moderate complexity  EVALUATION COMPLEXITY: Low  GOALS: Goals reviewed with patient? Yes  SHORT TERM GOALS: Target date: 08/09/23  Pt will be ind with MLD for the Left breast Baseline:5/10 Goal status: INITIAL  2.  Pt will decrease resting breast pain to 1/10 or less Baseline:  Goal status: INITIAL  3.  Pt will decrease breast edema complaints questionnaire to 45 or less to demonstrate improvements in the breast pain and composition.   Baseline: 68 Goal status: INITIAL   PLAN:  PT FREQUENCY: 2x/week  PT DURATION: 4 weeks  PLANNED INTERVENTIONS: 97110-Therapeutic exercises, 97530- Therapeutic activity, 97535- Self Care, 16109- Manual therapy, Patient/Family education, Therapeutic exercises, Therapeutic activity, Neuromuscular re-education, Gait training, and Self Care  PLAN FOR NEXT SESSION: Left breast MLD and reviewing self MLD, how was compression foam for medial breast? Show pt swell spot and issue handout in case she wants to pursue this, especially if compression foam(s) helpful.   Denyce Flank, PTA 07/23/2023, 9:34 AM

## 2023-07-23 NOTE — Telephone Encounter (Signed)
 Requesting: Vitamin D , Ergocalciferol , (DRISDOL ) 1.25 MG (50000 UNIT) CAPS capsule  Last Visit: 07/11/2023 Next Visit: 07/22/2023 Last Refill: 07/11/2023  Please Advise

## 2023-07-25 ENCOUNTER — Ambulatory Visit

## 2023-07-25 DIAGNOSIS — G8929 Other chronic pain: Secondary | ICD-10-CM | POA: Diagnosis not present

## 2023-07-25 DIAGNOSIS — Z483 Aftercare following surgery for neoplasm: Secondary | ICD-10-CM

## 2023-07-25 DIAGNOSIS — I890 Lymphedema, not elsewhere classified: Secondary | ICD-10-CM | POA: Diagnosis not present

## 2023-07-25 DIAGNOSIS — C50412 Malignant neoplasm of upper-outer quadrant of left female breast: Secondary | ICD-10-CM | POA: Diagnosis not present

## 2023-07-25 DIAGNOSIS — M25512 Pain in left shoulder: Secondary | ICD-10-CM | POA: Diagnosis not present

## 2023-07-25 DIAGNOSIS — Z170 Estrogen receptor positive status [ER+]: Secondary | ICD-10-CM | POA: Diagnosis not present

## 2023-07-25 DIAGNOSIS — C50812 Malignant neoplasm of overlapping sites of left female breast: Secondary | ICD-10-CM | POA: Diagnosis not present

## 2023-07-25 NOTE — Therapy (Signed)
 OUTPATIENT PHYSICAL THERAPY  UPPER EXTREMITY ONCOLOGY TREATMENT  Patient Name: Lynn [name] MRN: [id number] DOB:December 24, 1985, 38 y.o., female Today's Date: 07/25/2023  END OF SESSION:  PT End of Session - 07/25/23 0808     Visit Number 6    Number of Visits 9    Date for PT Re-Evaluation 08/09/23    Authorization Type 6 visits 07/05/23-09/02/23    Authorization - Visit Number 6    Authorization - Number of Visits 6    PT Start Time 0805    PT Stop Time 0903    PT Time Calculation (min) 58 min    Activity Tolerance Patient tolerated treatment well    Behavior During Therapy [facility] for tasks assessed/performed              Past Medical History:  Diagnosis Date   Anemia    Cancer (HCC) 2024   L breast   Carpal tunnel syndrome, left    Depression    Diabetes (HCC)    Dysrhythmia    Palpitations   GERD (gastroesophageal reflux disease)    Hypertension    Iron deficiency    Panic attacks    PCOS (polycystic ovarian syndrome)    Personal history of chemotherapy    Pneumonia    Port-A-Cath in place 07/31/2022   Tachycardia    Umbilical hernia    Past Surgical History:  Procedure Laterality Date   ADENOIDECTOMY     As a child   AXILLARY LYMPH NODE DISSECTION Left 11/20/2022   Procedure: LEFT AXILLARY DISSECTION;  Surgeon: Enid Harry, MD;  Location: [facility] OR;  Service: General;  Laterality: Left;   BREAST BIOPSY Left 05/29/2022   US  LT BREAST BX W LOC DEV 1ST LESION IMG BX SPEC US  GUIDE 05/29/2022 [facility]   BREAST BIOPSY Left 05/29/2022   US  LT BREAST BX W LOC DEV EA ADD LESION IMG BX SPEC US  GUIDE 05/29/2022 [facility]   BREAST BIOPSY Left 06/06/2022   MM LT BREAST BX W LOC DEV 1ST LESION IMAGE BX SPEC STEREO GUIDE 06/06/2022 [facility]   BREAST BIOPSY  11/19/2022   MM LT RADIOACTIVE SEED LOC MAMMO GUIDE 11/19/2022 [facility]   BREAST BIOPSY  11/19/2022   MM LT RADIOACTIVE SEED EA ADD LESION LOC MAMMO GUIDE 11/19/2022 [facility]    BREAST BIOPSY  11/19/2022   MM LT RADIOACTIVE SEED EA ADD LESION LOC MAMMO GUIDE 11/19/2022 [facility]   BREAST LUMPECTOMY WITH RADIOACTIVE SEED AND AXILLARY LYMPH NODE DISSECTION Left 11/20/2022   Procedure: LEFT BREAST LUMPECTOMY WITH RADIOACTIVE SEED;  Surgeon: Enid Harry, MD;  Location: [facility] OR;  Service: General;  Laterality: Left;  PEC BLOCK   CESAREAN SECTION  2015   PORT-A-CATH REMOVAL Right 11/20/2022   Procedure: REMOVAL PORT-A-CATH;  Surgeon: Enid Harry, MD;  Location: [facility] OR;  Service: General;  Laterality: Right;   PORTACATH PLACEMENT N/A 06/19/2022   Procedure: INSERTION PORT-A-CATH;  Surgeon: Enid Harry, MD;  Location: [facility] OR;  Service: General;  Laterality: N/A;   TONSILLECTOMY     As a child   Patient Active Problem List   Diagnosis Date Noted   Anemia 07/11/2023   Breast cancer (HCC) 11/20/2022   Genetic testing 06/15/2022   Anxiety and depression 06/11/2022   Malignant neoplasm of overlapping sites of left breast in female, estrogen receptor positive (HCC) 06/04/2022   Syncope and collapse 03/12/2022   Umbilical hernia without obstruction and without gangrene 03/12/2022   Vitamin D  deficiency 03/12/2022   Cervical  radicular pain 12/07/2021   Snoring 12/07/2021   Other fatigue 12/07/2021   Chest pain 08/15/2021   Epigastric pain 08/15/2021   Acute pain of both shoulders 04/21/2021   Nerve pain 04/21/2021   Morbid obesity (HCC) 10/13/2020   Essential hypertension 03/12/2019   Chronic tachycardia 03/12/2019   Generalized anxiety disorder with panic attacks 03/12/2019   Family history of heart attack 03/12/2019   LVH (left ventricular hypertrophy) due to hypertensive disease, without heart failure 03/12/2019   Type 2 diabetes mellitus (HCC) 03/12/2019    PCP: Rheba Cedar, NP  REFERRING PROVIDER: Cameron Cea, MD  REFERRING DIAG:  Diagnosis  C50.812,Z17.0 (ICD-10-CM) - Malignant neoplasm of overlapping sites of left breast in  female, estrogen receptor positive (HCC)    THERAPY DIAG:  Malignant neoplasm of upper-outer quadrant of left breast in female, estrogen receptor positive (HCC)  Aftercare following surgery for neoplasm  Lymphedema, not elsewhere classified  Chronic left shoulder pain  ONSET DATE: 05/2022  Rationale for Evaluation and Treatment: Rehabilitation  SUBJECTIVE:                                                                                                                                                                                           SUBJECTIVE STATEMENT: My Lt breast feels better today than it did last time. The foam is really seeming to help the outer part of my breast. I feel like it needs to be thicker to be against my skin better at the inside of my breast though where my bra doesn't quite compress well enough.   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 She has a BMI > 50, hypertension, and diabetes. Left rotator cuff tear; needs surgery but is now postponed. Pt had neoadjuvant chemotherapy and she is s/p a Left lumpectomy with ALND and 0+/14 LN's. Completed radiation 03/25/23. Noted breast pain at [facility].   PAIN:  Are you having pain? No  PRECAUTIONS: Lt UE lymphedema risk   RED FLAGS: None   WEIGHT BEARING RESTRICTIONS: No  FALLS:  Has patient fallen in last 6 months? No  LIVING ENVIRONMENT: Lives with: lives with their family and lives with their spouse  OCCUPATION: [employer], desk work     LEISURE: nothing now   HAND DOMINANCE: right   PRIOR LEVEL OF FUNCTION: Independent  PATIENT GOALS: decrease the pain   OBJECTIVE: Note: Objective measures were completed at Evaluation unless otherwise noted.  COGNITION: Overall cognitive status: Within functional limits for tasks assessed   PALPATION: Fibrosis and +  2 ttp medial  breast, fibrosis lateral breast  with +1 ttp   OBSERVATIONS / OTHER ASSESSMENTS: enlarged pores and peau de orange   POSTURE: rounded shoulders   UPPER EXTREMITY AROM/PROM: [facility]  BREAST COMPLAINTS QUESTIONNAIRE    07/05/23  07/25/23 Pain:   8  4  Heaviness:  6  7 Swollen feeling: 8  5 Tense Skin:  8  3 Redness:  8  0 Bra Print:  10  5 Size of Pores:  10  6 Hard feeling:   10  6 Total:   68  /80    36/80 A Score over 9 indicates lymphedema issues in the breast                                                                                                                             TREATMENT DATE:  With pt permission for breast MLD 07/25/23: Manual Therapy Made and issued 1/2" foam to replace 1/4" foam for her medial breast piece. Pt reports better coverage here where her bra doesn't give enough compression.  MLD to Lt breast as follows: Short neck, superficial and deep abdominals, Lt inguinal nodes and Lt axillo-inguinal anastomosis, Rt axillary and pectoral nodes, anterior intact thorax sequence, anterior inter-axillary anastomosis, then focused on medial and superior breast, next into Rt S/L for focus to lateral breast redirecting towards lateral anastomosis and focusing on area of fibrosis palpated at lateral breast, then finished retracing all steps in supine.  P/ROM to Lt shoulder during MLD into flex, abd and D2 with scapular depression throughout, end motion much improved today STM to Lt axilla and lateral trunk, axilla still very tight but lateral trunk much improved  07/23/23: Manual Therapy Made and issued different compression foam options for day and night wear in compression bra: 2 separate pieces of small dotted peach medi foam against 1/4" gray foam placed in thin stockinette for pt to wear at medial and lateral breast, then a chip pack that pt can wear at inferior breast at night. She reports day pieces comfortable upon donning at end of session.  MLD to Lt breast as  follows: Short neck, superficial and deep abdominals, Lt inguinal nodes and Lt axillo-inguinal anastomosis, Rt axillary and pectoral nodes, anterior intact thorax sequence, anterior inter-axillary anastomosis, then focused on medial and superior breast, next into Rt S/L for focus to lateral breast redirecting towards lateral anastomosis, then finished retracing all steps in supine.  P/ROM to Lt shoulder during MLD into flex, abd and D2 with scapular depression throughout, pt still with limited end motions due to fascial restrictions but this did seem some improved today STM to Lt axilla and lateral trunk where pt still palpably tight but this does seem improved today.    07/18/23: Manual Therapy MLD to Lt breast as follows: Short neck, superficial and deep abdominals, Lt inguinal nodes and Lt axillo-inguinal anastomosis, Rt axillary and pectoral nodes, anterior intact thorax sequence, anterior inter-axillary anastomosis, then focused  on medial and superior breast, then finished retracing all steps. P/ROM to Lt shoulder during MLD into flex, abd and D2 with scapular depression throughout, pt still with limited end motions due to fascial restrictions      PATIENT EDUCATION:  Education details: per today's note Person educated: Patient Education method: Programmer, multimedia, Demonstration, Tactile cues, Verbal cues, and Handouts Education comprehension: verbalized understanding, returned demonstration, and needs further education  HOME EXERCISE PROGRAM: Self MLD, compression  ASSESSMENT:  CLINICAL IMPRESSION: Insurance re auth sent today. Pt is progressing well towards goal of decreasing her breast lymphedema as is evidenced by her breast complaint questionnaire reducing from 68 to 36/80. She is independent at this time with self MLD and her pain is much improved in the morning, end of work day intermittent shooting pains return with increased swelling. Today continued with focus on further reducing  breast lymphedema and adjusted her compression foam to better fit pts breast in the bra. Pt will benefit from 2 more visits to further reduce her breast lymphedema symptoms.    OBJECTIVE IMPAIRMENTS: decreased knowledge of condition, decreased knowledge of use of DME, increased edema, and increased fascial restrictions.   ACTIVITY LIMITATIONS: sleeping  PARTICIPATION LIMITATIONS: community activity  PERSONAL FACTORS: 1-2 comorbidities: ALND, radiation  are also affecting patient's functional outcome.   REHAB POTENTIAL: Excellent  CLINICAL DECISION MAKING: Evolving/moderate complexity  EVALUATION COMPLEXITY: Low  GOALS: Goals reviewed with patient? Yes  SHORT TERM GOALS: Target date: 08/09/23  Pt will be ind with MLD for the Left breast Baseline: 07/25/23 - Pt is independent with this Goal status: MET  2.  Pt will decrease resting breast pain to 1/10 or less Baseline: 5/10; 07/25/23 - 0/10 at times, but not consistently, end of work day some shooting pains with increased swelling noted Goal status: PARTIALLY MET  3.  Pt will decrease breast edema complaints questionnaire to 45 or less to demonstrate improvements in the breast pain and composition.   Baseline: 68; 07/25/23 - 36 Goal status: ONGOING   PLAN:  PT FREQUENCY: 2x/week  PT DURATION: 4 weeks  PLANNED INTERVENTIONS: 97110-Therapeutic exercises, 97530- Therapeutic activity, 97535- Self Care, 16109- Manual therapy, Patient/Family education, Therapeutic exercises, Therapeutic activity, Neuromuscular re-education, Gait training, and Self Care  PLAN FOR NEXT SESSION: Left breast MLD, how was new compression foam for medial breast? Show pt swell spot and issue handout in case she wants to pursue this, especially if compression foam(s) helpful.   Denyce Flank, PTA 07/25/2023, 9:49 AM

## 2023-07-29 ENCOUNTER — Other Ambulatory Visit: Payer: Self-pay

## 2023-07-30 ENCOUNTER — Ambulatory Visit

## 2023-07-30 DIAGNOSIS — C50812 Malignant neoplasm of overlapping sites of left female breast: Secondary | ICD-10-CM | POA: Diagnosis not present

## 2023-07-30 DIAGNOSIS — I890 Lymphedema, not elsewhere classified: Secondary | ICD-10-CM

## 2023-07-30 DIAGNOSIS — C50412 Malignant neoplasm of upper-outer quadrant of left female breast: Secondary | ICD-10-CM | POA: Diagnosis not present

## 2023-07-30 DIAGNOSIS — M25512 Pain in left shoulder: Secondary | ICD-10-CM | POA: Diagnosis not present

## 2023-07-30 DIAGNOSIS — Z170 Estrogen receptor positive status [ER+]: Secondary | ICD-10-CM

## 2023-07-30 DIAGNOSIS — G8929 Other chronic pain: Secondary | ICD-10-CM | POA: Diagnosis not present

## 2023-07-30 DIAGNOSIS — Z483 Aftercare following surgery for neoplasm: Secondary | ICD-10-CM

## 2023-07-30 NOTE — Therapy (Signed)
 OUTPATIENT PHYSICAL THERAPY  UPPER EXTREMITY ONCOLOGY TREATMENT  Patient Name: Lynn [name] MRN: [id number] DOB:1985/05/06, 38 y.o., female Today's Date: 07/30/2023  END OF SESSION:  PT End of Session - 07/30/23 0856     Visit Number 7    Number of Visits 9    Date for PT Re-Evaluation 08/09/23    Authorization Type 6 visits 07/05/23-09/02/23; 5 visits 07/26/23-09/23/23    Authorization - Visit Number 1    Authorization - Number of Visits 5    PT Start Time 0803    PT Stop Time 0900    PT Time Calculation (min) 57 min              Past Medical History:  Diagnosis Date   Anemia    Cancer (HCC) 2024   L breast   Carpal tunnel syndrome, left    Depression    Diabetes (HCC)    Dysrhythmia    Palpitations   GERD (gastroesophageal reflux disease)    Hypertension    Iron deficiency    Panic attacks    PCOS (polycystic ovarian syndrome)    Personal history of chemotherapy    Pneumonia    Port-A-Cath in place 07/31/2022   Tachycardia    Umbilical hernia    Past Surgical History:  Procedure Laterality Date   ADENOIDECTOMY     As a child   AXILLARY LYMPH NODE DISSECTION Left 11/20/2022   Procedure: LEFT AXILLARY DISSECTION;  Surgeon: Enid Harry, MD;  Location: [facility] OR;  Service: General;  Laterality: Left;   BREAST BIOPSY Left 05/29/2022   US  LT BREAST BX W LOC DEV 1ST LESION IMG BX SPEC US  GUIDE 05/29/2022 [facility]   BREAST BIOPSY Left 05/29/2022   US  LT BREAST BX W LOC DEV EA ADD LESION IMG BX SPEC US  GUIDE 05/29/2022 [facility]   BREAST BIOPSY Left 06/06/2022   MM LT BREAST BX W LOC DEV 1ST LESION IMAGE BX SPEC STEREO GUIDE 06/06/2022 [facility]   BREAST BIOPSY  11/19/2022   MM LT RADIOACTIVE SEED LOC MAMMO GUIDE 11/19/2022 [facility]   BREAST BIOPSY  11/19/2022   MM LT RADIOACTIVE SEED EA ADD LESION LOC MAMMO GUIDE 11/19/2022 [facility]   BREAST BIOPSY  11/19/2022   MM LT RADIOACTIVE SEED EA ADD LESION LOC MAMMO GUIDE  11/19/2022 [facility]   BREAST LUMPECTOMY WITH RADIOACTIVE SEED AND AXILLARY LYMPH NODE DISSECTION Left 11/20/2022   Procedure: LEFT BREAST LUMPECTOMY WITH RADIOACTIVE SEED;  Surgeon: Enid Harry, MD;  Location: [facility] OR;  Service: General;  Laterality: Left;  PEC BLOCK   CESAREAN SECTION  2015   PORT-A-CATH REMOVAL Right 11/20/2022   Procedure: REMOVAL PORT-A-CATH;  Surgeon: Enid Harry, MD;  Location: [facility] OR;  Service: General;  Laterality: Right;   PORTACATH PLACEMENT N/A 06/19/2022   Procedure: INSERTION PORT-A-CATH;  Surgeon: Enid Harry, MD;  Location: [facility] OR;  Service: General;  Laterality: N/A;   TONSILLECTOMY     As a child   Patient Active Problem List   Diagnosis Date Noted   Anemia 07/11/2023   Breast cancer (HCC) 11/20/2022   Genetic testing 06/15/2022   Anxiety and depression 06/11/2022   Malignant neoplasm of overlapping sites of left breast in female, estrogen receptor positive (HCC) 06/04/2022   Syncope and collapse 03/12/2022   Umbilical hernia without obstruction and without gangrene 03/12/2022   Vitamin D  deficiency 03/12/2022   Cervical radicular pain 12/07/2021   Snoring 12/07/2021   Other fatigue 12/07/2021   Chest pain  08/15/2021   Epigastric pain 08/15/2021   Acute pain of both shoulders 04/21/2021   Nerve pain 04/21/2021   Morbid obesity (HCC) 10/13/2020   Essential hypertension 03/12/2019   Chronic tachycardia 03/12/2019   Generalized anxiety disorder with panic attacks 03/12/2019   Family history of heart attack 03/12/2019   LVH (left ventricular hypertrophy) due to hypertensive disease, without heart failure 03/12/2019   Type 2 diabetes mellitus (HCC) 03/12/2019    PCP: Rheba Cedar, NP  REFERRING PROVIDER: Cameron Cea, MD  REFERRING DIAG:  Diagnosis  C50.812,Z17.0 (ICD-10-CM) - Malignant neoplasm of overlapping sites of left breast in female, estrogen receptor positive (HCC)    THERAPY DIAG:  Malignant neoplasm of  upper-outer quadrant of left breast in female, estrogen receptor positive (HCC)  Aftercare following surgery for neoplasm  Lymphedema, not elsewhere classified  Chronic left shoulder pain  ONSET DATE: 05/2022  Rationale for Evaluation and Treatment: Rehabilitation  SUBJECTIVE:                                                                                                                                                                                           SUBJECTIVE STATEMENT: My Lt breast feels better today than it did last time. The foam is really seeming to help the outer part of my breast. I feel like it needs to be thicker to be against my skin better at the inside of my breast though where my bra doesn't quite compress well enough.   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 She has a BMI > 50, hypertension, and diabetes. Left rotator cuff tear; needs surgery but is now postponed. Pt had neoadjuvant chemotherapy and she is s/p a Left lumpectomy with ALND and 0+/14 LN's. Completed radiation 03/25/23. Noted breast pain at [facility].   PAIN:  Are you having pain? No  PRECAUTIONS: Lt UE lymphedema risk   RED FLAGS: None   WEIGHT BEARING RESTRICTIONS: No  FALLS:  Has patient fallen in last 6 months? No  LIVING ENVIRONMENT: Lives with: lives with their family and lives with their spouse  OCCUPATION: [employer], desk work     LEISURE: nothing now   HAND DOMINANCE: right   PRIOR LEVEL OF FUNCTION: Independent  PATIENT GOALS: decrease the pain   OBJECTIVE: Note: Objective measures were completed at Evaluation unless otherwise noted.  COGNITION: Overall cognitive status: Within functional limits for tasks assessed   PALPATION: Fibrosis and +2 ttp medial breast, fibrosis lateral breast  with +1 ttp   OBSERVATIONS / OTHER  ASSESSMENTS:  enlarged pores and peau de orange   POSTURE: rounded shoulders   UPPER EXTREMITY AROM/PROM: WFL  BREAST COMPLAINTS QUESTIONNAIRE    07/05/23  07/25/23 Pain:   8  4  Heaviness:  6  7 Swollen feeling: 8  5 Tense Skin:  8  3 Redness:  8  0 Bra Print:  10  5 Size of Pores:  10  6 Hard feeling:   10  6 Total:   68  /80    36/80 A Score over 9 indicates lymphedema issues in the breast                                                                                                                             TREATMENT DATE:  With pt permission for breast MLD 07/30/23: Self Care Educated pt on swell spots and that since she is noting fluid softening with the chip packs that she would then benefit from a swell spot. Pt is interested in pursuing this so set an account up for her on [employer] so that she is able to order at her convenience.  Manual Therapy MLD to Lt breast as follows: Short neck, superficial and deep abdominals, Lt inguinal nodes and Lt axillo-inguinal anastomosis, Rt axillary and pectoral nodes, anterior intact thorax sequence, anterior inter-axillary anastomosis, then focused on medial and superior breast, next into Rt S/L for focus to lateral breast redirecting towards lateral anastomosis and focusing on area of fibrosis palpated at lateral breast, then finished retracing all steps in supine.  P/ROM to Lt shoulder during MLD into flex, abd and D2 with scapular depression throughout, end motion much improved today STM to Lt axilla and lateral trunk, axilla still very tight but lateral trunk much improved Therapeutic Exercise Roll yellow ball up wall into flex and abd x 10 each focusing on end ROM stretch Modified downward dog on wall x 5 reps, 5 sec holds returning therapist demo  07/25/23: Manual Therapy Made and issued 1/2" foam to replace 1/4" foam for her medial breast piece. Pt reports better coverage here where her bra doesn't give enough compression.  MLD to Lt breast as  follows: Short neck, superficial and deep abdominals, Lt inguinal nodes and Lt axillo-inguinal anastomosis, Rt axillary and pectoral nodes, anterior intact thorax sequence, anterior inter-axillary anastomosis, then focused on medial and superior breast, next into Rt S/L for focus to lateral breast redirecting towards lateral anastomosis and focusing on area of fibrosis palpated at lateral breast, then finished retracing all steps in supine.  P/ROM to Lt shoulder during MLD into flex, abd and D2 with scapular depression throughout, end motion much improved today STM to Lt axilla and lateral trunk, axilla still very tight but lateral trunk much improved  07/23/23: Manual Therapy Made and issued different compression foam options for day and night wear in compression bra: 2 separate pieces of small dotted peach medi foam against 1/4" gray foam placed in thin stockinette for pt  to wear at medial and lateral breast, then a chip pack that pt can wear at inferior breast at night. She reports day pieces comfortable upon donning at end of session.  MLD to Lt breast as follows: Short neck, superficial and deep abdominals, Lt inguinal nodes and Lt axillo-inguinal anastomosis, Rt axillary and pectoral nodes, anterior intact thorax sequence, anterior inter-axillary anastomosis, then focused on medial and superior breast, next into Rt S/L for focus to lateral breast redirecting towards lateral anastomosis, then finished retracing all steps in supine.  P/ROM to Lt shoulder during MLD into flex, abd and D2 with scapular depression throughout, pt still with limited end motions due to fascial restrictions but this did seem some improved today STM to Lt axilla and lateral trunk where pt still palpably tight but this does seem improved today.    07/18/23: Manual Therapy MLD to Lt breast as follows: Short neck, superficial and deep abdominals, Lt inguinal nodes and Lt axillo-inguinal anastomosis, Rt axillary and pectoral  nodes, anterior intact thorax sequence, anterior inter-axillary anastomosis, then focused on medial and superior breast, then finished retracing all steps. P/ROM to Lt shoulder during MLD into flex, abd and D2 with scapular depression throughout, pt still with limited end motions due to fascial restrictions      PATIENT EDUCATION:  Education details: per today's note Person educated: Patient Education method: Explanation, Demonstration, Tactile cues, Verbal cues, and Handouts Education comprehension: verbalized understanding, returned demonstration, and needs further education  HOME EXERCISE PROGRAM: Self MLD, compression  ASSESSMENT:  CLINICAL IMPRESSION: Pt was approved for 5 more visits with insurance auth. Today continued with manual therapy as her medial breast continues with fibrosis despite pts use of compression foam. Showed her swell spots online and she is interested in pursuing this so set her up with [employer] and now pt can order at her convenience. Then continued with end AA/ROM stretching reminding pt to try working this into her day as much as able to help loosen up the scar tissue in her Lt axilla that limits her end motions.    OBJECTIVE IMPAIRMENTS: decreased knowledge of condition, decreased knowledge of use of DME, increased edema, and increased fascial restrictions.   ACTIVITY LIMITATIONS: sleeping  PARTICIPATION LIMITATIONS: community activity  PERSONAL FACTORS: 1-2 comorbidities: ALND, radiation  are also affecting patient's functional outcome.   REHAB POTENTIAL: Excellent  CLINICAL DECISION MAKING: Evolving/moderate complexity  EVALUATION COMPLEXITY: Low  GOALS: Goals reviewed with patient? Yes  SHORT TERM GOALS: Target date: 08/09/23  Pt will be ind with MLD for the Left breast Baseline: 07/25/23 - Pt is independent with this Goal status: MET  2.  Pt will decrease resting breast pain to 1/10 or less Baseline: 5/10; 07/25/23 - 0/10 at times, but not  consistently, end of work day some shooting pains with increased swelling noted Goal status: PARTIALLY MET  3.  Pt will decrease breast edema complaints questionnaire to 45 or less to demonstrate improvements in the breast pain and composition.   Baseline: 68; 07/25/23 - 36 Goal status: ONGOING   PLAN:  PT FREQUENCY: 2x/week  PT DURATION: 4 weeks  PLANNED INTERVENTIONS: 97110-Therapeutic exercises, 97530- Therapeutic activity, 97535- Self Care, [id number]- Manual therapy, Patient/Family education, Therapeutic exercises, Therapeutic activity, Neuromuscular re-education, Gait training, and Self Care  PLAN FOR NEXT SESSION: Cont left breast MLD and Lt shoulder ROM stretches; add postural strength supine scapular series next with theraband.  Denyce Flank, PTA 07/30/2023, 9:17 AM

## 2023-07-31 ENCOUNTER — Other Ambulatory Visit (HOSPITAL_COMMUNITY): Payer: Self-pay

## 2023-07-31 NOTE — Progress Notes (Signed)
 [facility]       Telephone: [phone]?Fax: [phone]   Oncology Clinical Pharmacist Practitioner Progress Note  Lynn [name] was contacted via in-person to discuss her chemotherapy regimen for abemaciclib  which they receive under the care of Dr. Vinay Gudena.  Current treatment regimen and start date Abemaciclib  (04/10/23) Letrozole  (06/06/23) Anastrozole  (04/09/23) -- discontinued due to headaches Goserelin (04/10/23)  Interval History She continues on abemaciclib  100 mg by mouth every 12 hours on days 1 to 28 of a 28-day cycle. This is being given in combination with anastrozole  and goserelin . Therapy is planned to continue until two years in the adjuvant setting per the [facility] trial data. She was last seen by Dr. Lee Public on 06/06/23 and clinical pharmacy on 04/24/23. She switched to letrozole  on 06/06/23 due to headaches while on anastrozole . MRI was negative. She also saw Polly Brink from [facility] for breast pain which is being attributed to lymphedema. She had a lymphedema treatment today.  Response to Therapy Continues to do well on abemaciclib  100 mg PO BID. Discussed increasing dose today to 150 mg PO BID and she is agreeable. New prescription sent to [facility]. She is due for fulvestrant today and again in 4 weeks. Will add labs and visit with clinical pharmacy on that date to assess toxicities. She will contact us  sooner if needed. She will see Dr.Gudena again in 8 weeks with labs. She saw GYN and at this time, she will not have BSO. She will continue goserelin every 28 days for now.  Allergies Allergies  Allergen Reactions   Macrobid [Nitrofurantoin Macrocrystal] Shortness Of Breath and Nausea And Vomiting   Ibuprofen Hives    Vitals    08/01/2023    2:14 PM 07/15/2023   10:09 AM 07/15/2023    9:20 AM  Oncology Vitals  Weight 155.901 kg  153.588 kg  Weight (lbs) 343 lbs 11 oz  338 lbs 10 oz  BMI 53.83 kg/m2  53.03 kg/m2  Temp 97.6 F (36.4 C)  97.7 F (36.5 C)  Pulse Rate  98  101  BP 139/69 138/72 148/83  Resp 16  18  SpO2 97 %  97 %  BSA (m2) 2.71 m2  2.69 m2    Laboratory Data    Latest Ref Rng & Units 08/01/2023    1:12 PM 06/06/2023   10:25 AM 05/09/2023    2:38 PM  CBC EXTENDED  WBC 4.0 - 10.5 K/uL 4.0  4.0  3.9   RBC 3.87 - 5.11 MIL/uL 3.67  3.58  4.13   Hemoglobin 12.0 - 15.0 g/dL 29.5  62.1  30.8   HCT 36.0 - 46.0 % 30.4  29.7  33.3   Platelets 150 - 400 K/uL 197  176  219   NEUT# 1.7 - 7.7 K/uL 2.5  2.9  2.5   Lymph# 0.7 - 4.0 K/uL 1.0  0.6  0.9        Latest Ref Rng & Units 08/01/2023    1:12 PM 06/06/2023   10:25 AM 05/09/2023    2:38 PM  CMP  Glucose 70 - 99 mg/dL 657  846  962   BUN 6 - 20 mg/dL 16  13  16    Creatinine 0.44 - 1.00 mg/dL 9.52  8.41  3.24   Sodium 135 - 145 mmol/L 133  136  134   Potassium 3.5 - 5.1 mmol/L 4.0  4.4  4.1   Chloride 98 - 111 mmol/L 100  103  102  CO2 22 - 32 mmol/L 28  29  26    Calcium 8.9 - 10.3 mg/dL 9.2  8.8  9.2   Total Protein 6.5 - 8.1 g/dL 7.3  6.9  7.4   Total Bilirubin 0.0 - 1.2 mg/dL 0.5  0.6  0.6   Alkaline Phos 38 - 126 U/L 69  56  62   AST 15 - 41 U/L 29  19  21    ALT 0 - 44 U/L 32  22  22     Adverse Effects Assessment Hgb: stable Sodium: down slightly. Overall stable.  Adherence Assessment Lynn [name] reports missing 0 doses over the past 2 weeks.   Reason for missed dose: N/A Patient was re-educated on importance of adherence.   Access Assessment Lynn [name] is currently receiving her abemaciclib  through [facility] concerns:  none  Medication Reconciliation The patient's medication list was reviewed today with the patient? Yes New medications or herbal supplements have recently been started? No  Any medications have been discontinued? No  The medication list was updated and reconciled based on the patient's most recent medication list in the electronic medical record (EMR) including herbal products and OTC medications.    Medications Current Outpatient Medications  Medication Sig Dispense Refill   abemaciclib  (VERZENIO ) 100 MG tablet Take 1 tablet (100 mg total) by mouth 2 (two) times daily. 56 tablet 3   ALPRAZolam  (XANAX ) 0.5 MG tablet TAKE 1 TABLET BY MOUTH AT BEDTIME AS NEEDED FOR ANXIETY 30 tablet 3   aspirin EC 81 MG tablet Take 81 mg by mouth daily. Swallow whole.     aspirin-acetaminophen -caffeine (EXCEDRIN MIGRAINE) 250-250-65 MG tablet Take 1-2 tablets by mouth 2 (two) times daily as needed for headache or migraine.     Blood Glucose Monitoring Suppl (ACCU-CHEK AVIVA PLUS) w/Device KIT Use to check blood sugar once daily at 6 (six) AM. 1 kit 0   carvedilol  (COREG ) 25 MG tablet Take 1 tablet (25 mg total) by mouth 2 (two) times daily with a meal. 180 tablet 0   carvedilol  (COREG ) 25 MG tablet Take 1 tablet (25 mg total) by mouth 2 (two) times daily with a meal. 180 tablet 0   cyanocobalamin  (VITAMIN B12) 1000 MCG tablet Take 2,000 mcg by mouth daily.     escitalopram  (LEXAPRO ) 10 MG tablet Take 1 tablet (10 mg total) by mouth daily. 30 tablet 2   esomeprazole (NEXIUM) 20 MG capsule Take 20 mg by mouth daily at 12 noon.     gabapentin  (NEURONTIN ) 100 MG capsule Take 1 capsule (100 mg total) by mouth 3 (three) times daily. 90 capsule 3   glucose blood (ACCU-CHEK AVIVA PLUS) test strip Use as instructed 100 each 12   glucose blood (ACCU-CHEK GUIDE TEST) test strip Use to check blood sugar once a day at 6 am. 50 strip 0   goserelin (ZOLADEX ) 3.6 MG injection Inject 3.6 mg into the skin every 28 (twenty-eight) days.     Lancets (ONETOUCH DELICA PLUS LANCET33G) MISC Use to check blood sugar once a day at 6 am. 100 each 0   letrozole  (FEMARA ) 2.5 MG tablet Take 1 tablet (2.5 mg total) by mouth daily. 90 tablet 3   lisinopril -hydrochlorothiazide  (ZESTORETIC ) 10-12.5 MG tablet Take 1 tablet by mouth daily. 30 tablet 1   ONE TOUCH CLUB LANCETS MISC 1 each by Does not apply route daily. 100 each 11    prochlorperazine  (COMPAZINE ) 10 MG tablet Take 1 tablet (10 mg total)  by mouth every 6 (six) hours as needed for nausea or vomiting. 30 tablet 2   Semaglutide , 1 MG/DOSE, 4 MG/3ML SOPN Inject 1 mg as directed once a week. 3 mL 1   Vitamin D , Ergocalciferol , (DRISDOL ) 1.25 MG (50000 UNIT) CAPS capsule Take 1 capsule (50,000 Units total) by mouth every 7 (seven) days. 12 capsule 0   No current facility-administered medications for this visit.   Drug-Drug Interactions (DDIs) DDIs were evaluated? Yes Significant DDIs? No  The patient was instructed to speak with their health care provider and/or the oral chemotherapy pharmacist before starting any new drug, including prescription or over the counter, natural / herbal products, or vitamins.  Supportive Care Diarrhea: we reviewed that diarrhea is common with abemaciclib  and confirmed that she does have loperamide (Imodium) at home.  We reviewed how to take this medication PRN. Neutropenia: we discussed the importance of having a thermometer and what the Centers for Disease Control and Prevention (CDC) considers a fever which is 100.107F (38C) or higher.  Gave patient 24/7 triage line to call if any fevers or symptoms. ILD/Pneumonitis: we reviewed potential symptoms including cough, shortness, and fatigue.  VTE: reviewed signs of DVT such as leg swelling, redness, pain, or tenderness and signs of PE such as shortness of breath, rapid or irregular heartbeat, cough, chest pain, or lightheadedness. Reviewed to take the medication every 12 hours (with food sometimes can be easier on the stomach) and to take it at the same time every day. Hepatotoxicity:WNL Drug interactions with grapefruit products  Dosing Assessment Hepatic adjustments needed? No  Renal adjustments needed? No  Toxicity adjustments needed? No  The current dosing regimen is appropriate to continue at this time.  Follow-Up Plan Increase to  abemaciclib  150 by mouth every 12 hours once  received. New prescription sent to [facility]. Continue letrozole  2.5 mg by mouth daily. Headaches improved since stopping anastrozole  Continue goserelin 3.6 mg SubQ every 28 days. Due today, then again in 4 weeks (scheduled 08/29/23) Monitor sodium, Hgb Will add labs and pharmacy clinic visit to the fulvestrant injection in 4 weeks (08/29/23) since increasing dose of abemaciclib  Labs, Dr. Lee Public visit, goserelin scheduled for 09/26/23.  Lynn [name] can follow up with clinical pharmacy as deemed necessary by Dr. Cameron Cea going forward   Lynn [name] participated in the discussion, expressed understanding, and voiced agreement with the above plan. All questions were answered to her satisfaction. The patient was advised to contact the clinic at [phone] with any questions or concerns prior to her return visit.   I spent 30 minutes assessing and educating the patient.  Lynn [name] A. Webb Hake, PharmD, BCOP, CPP  Althea Atkinson, RPH-CPP, 08/01/2023  2:14 PM   **Disclaimer: This note was dictated with voice recognition software. Similar sounding words can inadvertently be transcribed and this note may contain transcription errors which may not have been corrected upon publication of note.**

## 2023-08-01 ENCOUNTER — Inpatient Hospital Stay: Payer: BC Managed Care – PPO

## 2023-08-01 ENCOUNTER — Inpatient Hospital Stay

## 2023-08-01 ENCOUNTER — Other Ambulatory Visit (HOSPITAL_COMMUNITY): Payer: Self-pay

## 2023-08-01 ENCOUNTER — Inpatient Hospital Stay: Admitting: Pharmacist

## 2023-08-01 ENCOUNTER — Inpatient Hospital Stay: Attending: Hematology and Oncology

## 2023-08-01 ENCOUNTER — Other Ambulatory Visit: Payer: Self-pay

## 2023-08-01 ENCOUNTER — Ambulatory Visit: Attending: Hematology and Oncology

## 2023-08-01 VITALS — BP 139/69 | HR 98 | Temp 97.6°F | Resp 16 | Wt 343.7 lb

## 2023-08-01 DIAGNOSIS — I890 Lymphedema, not elsewhere classified: Secondary | ICD-10-CM | POA: Insufficient documentation

## 2023-08-01 DIAGNOSIS — C50812 Malignant neoplasm of overlapping sites of left female breast: Secondary | ICD-10-CM | POA: Insufficient documentation

## 2023-08-01 DIAGNOSIS — Z483 Aftercare following surgery for neoplasm: Secondary | ICD-10-CM | POA: Insufficient documentation

## 2023-08-01 DIAGNOSIS — M25512 Pain in left shoulder: Secondary | ICD-10-CM | POA: Diagnosis not present

## 2023-08-01 DIAGNOSIS — Z170 Estrogen receptor positive status [ER+]: Secondary | ICD-10-CM

## 2023-08-01 DIAGNOSIS — C50412 Malignant neoplasm of upper-outer quadrant of left female breast: Secondary | ICD-10-CM | POA: Diagnosis not present

## 2023-08-01 DIAGNOSIS — Z5111 Encounter for antineoplastic chemotherapy: Secondary | ICD-10-CM | POA: Insufficient documentation

## 2023-08-01 DIAGNOSIS — G8929 Other chronic pain: Secondary | ICD-10-CM | POA: Diagnosis not present

## 2023-08-01 LAB — CBC WITH DIFFERENTIAL (CANCER CENTER ONLY)
Abs Immature Granulocytes: 0.01 10*3/uL (ref 0.00–0.07)
Basophils Absolute: 0.1 10*3/uL (ref 0.0–0.1)
Basophils Relative: 2 %
Eosinophils Absolute: 0 10*3/uL (ref 0.0–0.5)
Eosinophils Relative: 1 %
HCT: 30.4 % — ABNORMAL LOW (ref 36.0–46.0)
Hemoglobin: 10.9 g/dL — ABNORMAL LOW (ref 12.0–15.0)
Immature Granulocytes: 0 %
Lymphocytes Relative: 25 %
Lymphs Abs: 1 10*3/uL (ref 0.7–4.0)
MCH: 29.7 pg (ref 26.0–34.0)
MCHC: 35.9 g/dL (ref 30.0–36.0)
MCV: 82.8 fL (ref 80.0–100.0)
Monocytes Absolute: 0.4 10*3/uL (ref 0.1–1.0)
Monocytes Relative: 9 %
Neutro Abs: 2.5 10*3/uL (ref 1.7–7.7)
Neutrophils Relative %: 63 %
Platelet Count: 197 10*3/uL (ref 150–400)
RBC: 3.67 MIL/uL — ABNORMAL LOW (ref 3.87–5.11)
RDW: 14.1 % (ref 11.5–15.5)
WBC Count: 4 10*3/uL (ref 4.0–10.5)
nRBC: 0 % (ref 0.0–0.2)

## 2023-08-01 LAB — CMP (CANCER CENTER ONLY)
ALT: 32 U/L (ref 0–44)
AST: 29 U/L (ref 15–41)
Albumin: 4 g/dL (ref 3.5–5.0)
Alkaline Phosphatase: 69 U/L (ref 38–126)
Anion gap: 5 (ref 5–15)
BUN: 16 mg/dL (ref 6–20)
CO2: 28 mmol/L (ref 22–32)
Calcium: 9.2 mg/dL (ref 8.9–10.3)
Chloride: 100 mmol/L (ref 98–111)
Creatinine: 0.9 mg/dL (ref 0.44–1.00)
GFR, Estimated: >60 mL/min (ref >60)
Glucose, Bld: 184 mg/dL — ABNORMAL HIGH (ref 70–99)
Potassium: 4 mmol/L (ref 3.5–5.1)
Sodium: 133 mmol/L — ABNORMAL LOW (ref 135–145)
Total Bilirubin: 0.5 mg/dL (ref 0.0–1.2)
Total Protein: 7.3 g/dL (ref 6.5–8.1)

## 2023-08-01 MED ORDER — ABEMACICLIB 150 MG PO TABS
150.0000 mg | ORAL_TABLET | Freq: Two times a day (BID) | ORAL | 3 refills | Status: DC
Start: 2023-08-01 — End: 2023-10-28
  Filled 2023-08-01 – 2023-08-13 (×3): qty 56, 28d supply, fill #0
  Filled 2023-08-21: qty 28, 14d supply, fill #0
  Filled 2023-09-05 – 2023-09-09 (×4): qty 28, 14d supply, fill #1
  Filled 2023-09-13: qty 28, 14d supply, fill #2
  Filled 2023-09-30 – 2023-10-02 (×2): qty 28, 14d supply, fill #3
  Filled 2023-10-16: qty 56, 28d supply, fill #4

## 2023-08-01 MED ORDER — GOSERELIN ACETATE 3.6 MG ~~LOC~~ IMPL
3.6000 mg | DRUG_IMPLANT | Freq: Once | SUBCUTANEOUS | Status: AC
Start: 2023-08-01 — End: 2023-08-01
  Administered 2023-08-01: 3.6 mg via SUBCUTANEOUS
  Filled 2023-08-01: qty 3.6

## 2023-08-01 NOTE — Therapy (Signed)
 OUTPATIENT PHYSICAL THERAPY  UPPER EXTREMITY ONCOLOGY TREATMENT  Patient Name: Lynn [name] MRN: [id number] DOB:[birth year], 38 y.o., female Today's Date: 08/01/2023  END OF SESSION:  PT End of Session - 08/01/23 0810     Visit Number 8    Number of Visits 9    Date for PT Re-Evaluation 08/09/23    Authorization Type 6 visits 07/05/23-09/02/23; 5 visits 07/26/23-09/23/23    Authorization - Visit Number 2    Authorization - Number of Visits 5    PT Start Time 0807    PT Stop Time 0902    PT Time Calculation (min) 55 min    Activity Tolerance Patient tolerated treatment well    Behavior During Therapy [facility] for tasks assessed/performed              Past Medical History:  Diagnosis Date   Anemia    Cancer (HCC) 2024   L breast   Carpal tunnel syndrome, left    Depression    Diabetes (HCC)    Dysrhythmia    Palpitations   GERD (gastroesophageal reflux disease)    Hypertension    Iron deficiency    Panic attacks    PCOS (polycystic ovarian syndrome)    Personal history of chemotherapy    Pneumonia    Port-A-Cath in place 07/31/2022   Tachycardia    Umbilical hernia    Past Surgical History:  Procedure Laterality Date   ADENOIDECTOMY     As a child   AXILLARY LYMPH NODE DISSECTION Left 11/20/2022   Procedure: LEFT AXILLARY DISSECTION;  Surgeon: Enid Harry, MD;  Location: [facility] OR;  Service: General;  Laterality: Left;   BREAST BIOPSY Left 05/29/2022   US  LT BREAST BX W LOC DEV 1ST LESION IMG BX SPEC US  GUIDE 05/29/2022 [facility]   BREAST BIOPSY Left 05/29/2022   US  LT BREAST BX W LOC DEV EA ADD LESION IMG BX SPEC US  GUIDE 05/29/2022 [facility]   BREAST BIOPSY Left 06/06/2022   MM LT BREAST BX W LOC DEV 1ST LESION IMAGE BX SPEC STEREO GUIDE 06/06/2022 [facility]   BREAST BIOPSY  11/19/2022   MM LT RADIOACTIVE SEED LOC MAMMO GUIDE 11/19/2022 [facility]   BREAST BIOPSY  11/19/2022   MM LT RADIOACTIVE SEED EA ADD LESION LOC MAMMO GUIDE  11/19/2022 [facility]   BREAST BIOPSY  11/19/2022   MM LT RADIOACTIVE SEED EA ADD LESION LOC MAMMO GUIDE 11/19/2022 [facility]   BREAST LUMPECTOMY WITH RADIOACTIVE SEED AND AXILLARY LYMPH NODE DISSECTION Left 11/20/2022   Procedure: LEFT BREAST LUMPECTOMY WITH RADIOACTIVE SEED;  Surgeon: Enid Harry, MD;  Location: [facility] OR;  Service: General;  Laterality: Left;  PEC BLOCK   CESAREAN SECTION  2015   PORT-A-CATH REMOVAL Right 11/20/2022   Procedure: REMOVAL PORT-A-CATH;  Surgeon: Enid Harry, MD;  Location: [facility] OR;  Service: General;  Laterality: Right;   PORTACATH PLACEMENT N/A 06/19/2022   Procedure: INSERTION PORT-A-CATH;  Surgeon: Enid Harry, MD;  Location: [facility] OR;  Service: General;  Laterality: N/A;   TONSILLECTOMY     As a child   Patient Active Problem List   Diagnosis Date Noted   Anemia 07/11/2023   Breast cancer (HCC) 11/20/2022   Genetic testing 06/15/2022   Anxiety and depression 06/11/2022   Malignant neoplasm of overlapping sites of left breast in female, estrogen receptor positive (HCC) 06/04/2022   Syncope and collapse 03/12/2022   Umbilical hernia without obstruction and without gangrene 03/12/2022   Vitamin D  deficiency 03/12/2022  Cervical radicular pain 12/07/2021   Snoring 12/07/2021   Other fatigue 12/07/2021   Chest pain 08/15/2021   Epigastric pain 08/15/2021   Acute pain of both shoulders 04/21/2021   Nerve pain 04/21/2021   Morbid obesity (HCC) 10/13/2020   Essential hypertension 03/12/2019   Chronic tachycardia 03/12/2019   Generalized anxiety disorder with panic attacks 03/12/2019   Family history of heart attack 03/12/2019   LVH (left ventricular hypertrophy) due to hypertensive disease, without heart failure 03/12/2019   Type 2 diabetes mellitus (HCC) 03/12/2019    PCP: Rheba Cedar, NP  REFERRING PROVIDER: Cameron Cea, MD  REFERRING DIAG:  Diagnosis  C50.812,Z17.0 (ICD-10-CM) - Malignant neoplasm of  overlapping sites of left breast in female, estrogen receptor positive (HCC)    THERAPY DIAG:  Malignant neoplasm of upper-outer quadrant of left breast in female, estrogen receptor positive (HCC)  Aftercare following surgery for neoplasm  Lymphedema, not elsewhere classified  Chronic left shoulder pain  ONSET DATE: 05/2022  Rationale for Evaluation and Treatment: Rehabilitation  SUBJECTIVE:                                                                                                                                                                                           SUBJECTIVE STATEMENT: My Lt shoulder still hurts a lot. That was my original problem before they found my breast cancer.   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 She has a BMI > 50, hypertension, and diabetes. Left rotator cuff tear; needs surgery but is now postponed. Pt had neoadjuvant chemotherapy and she is s/p a Left lumpectomy with ALND and 0+/14 LN's. Completed radiation 03/25/23. Noted breast pain at [facility].   PAIN:  Are you having pain? PAIN:  Are you having pain? Yes NPRS scale: 6/10, but up to 7-8/10 with movement Pain location: Lt shoulder  Pain orientation: Left  PAIN TYPE: aching and sore, sharp with pressure to the trigger point Pain description: constant , soreness always there but sharp pains are intermittent Aggravating factors: worse at end of work day Relieving factors: massage and stretches   PRECAUTIONS: Lt UE lymphedema risk   RED FLAGS: None   WEIGHT BEARING RESTRICTIONS: No  FALLS:  Has patient fallen in last 6 months? No  LIVING ENVIRONMENT: Lives with: lives with their family and lives with their spouse  OCCUPATION: [employer], desk work     LEISURE: nothing now   HAND DOMINANCE: right   PRIOR LEVEL OF FUNCTION: Independent  PATIENT  GOALS: decrease the pain  OBJECTIVE: Note: Objective measures were completed at Evaluation unless otherwise noted.  COGNITION: Overall cognitive status: Within functional limits for tasks assessed   PALPATION: Fibrosis and +2 ttp medial breast, fibrosis lateral breast  with +1 ttp   OBSERVATIONS / OTHER ASSESSMENTS: enlarged pores and peau de orange   POSTURE: rounded shoulders   UPPER EXTREMITY AROM/PROM: [facility]  BREAST COMPLAINTS QUESTIONNAIRE    07/05/23  07/25/23 Pain:   8  4  Heaviness:  6  7 Swollen feeling: 8  5 Tense Skin:  8  3 Redness:  8  0 Bra Print:  10  5 Size of Pores:  10  6 Hard feeling:   10  6 Total:   68  /80    36/80 A Score over 9 indicates lymphedema issues in the breast                                                                                                                             TREATMENT DATE:  With pt permission for breast MLD 08/01/23: Therapeutic Exercises Pulleys into flex and abd x 2 mins each with VC's to decrease scapular compensations Roll yellow ball up wall into flex and Lt abd x 10 each Therapeutic Activities Supine scapular series with yellow theraband x 10 each returning therapist demo. Handout issued Manual Therapy MLD to Lt breast as follows: Short neck, superficial abdominals and 5 diaphragmatic breaths, Lt inguinal nodes and Lt axillo-inguinal anastomosis, Rt axillary and pectoral nodes, anterior inter-axillary anastomosis, then focused on medial and superior breast, next into Rt S/L for focus to lateral breast redirecting towards lateral anastomosis and focusing on area of fibrosis palpated at lateral breast, then finished retracing all steps in supine.  P/ROM to Lt shoulder during MLD into flex, abd and D2 with scapular depression throughout, end motion much improved today STM to Lt axilla and lateral trunk, axilla still very tight but lateral trunk much improved; then shen in S/L STM to medial scap border and trigger points  at [place]  07/30/23: Self Care Educated pt on swell spots and that since she is noting fluid softening with the chip packs that she would then benefit from a swell spot. Pt is interested in pursuing this so set an account up for her on [employer] so that she is able to order at her convenience.  Manual Therapy MLD to Lt breast as follows: Short neck, superficial and deep abdominals, Lt inguinal nodes and Lt axillo-inguinal anastomosis, Rt axillary and pectoral nodes, anterior intact thorax sequence, anterior inter-axillary anastomosis, then focused on medial and superior breast, next into Rt S/L for focus to lateral breast redirecting towards lateral anastomosis and focusing on area of fibrosis palpated at lateral breast, then finished retracing all steps in supine.  P/ROM to Lt shoulder during MLD into flex, abd and D2 with scapular depression throughout, end motion much improved today STM to Lt axilla and lateral trunk, axilla still very tight but lateral trunk  much improved Therapeutic Exercise Roll yellow ball up wall into flex and abd x 10 each focusing on end ROM stretch Modified downward dog on wall x 5 reps, 5 sec holds returning therapist demo  07/25/23: Manual Therapy Made and issued 1/2" foam to replace 1/4" foam for her medial breast piece. Pt reports better coverage here where her bra doesn't give enough compression.  MLD to Lt breast as follows: Short neck, superficial and deep abdominals, Lt inguinal nodes and Lt axillo-inguinal anastomosis, Rt axillary and pectoral nodes, anterior intact thorax sequence, anterior inter-axillary anastomosis, then focused on medial and superior breast, next into Rt S/L for focus to lateral breast redirecting towards lateral anastomosis and focusing on area of fibrosis palpated at lateral breast, then finished retracing all steps in supine.  P/ROM to Lt shoulder during MLD into flex, abd and D2 with scapular depression throughout, end motion much improved  today STM to Lt axilla and lateral trunk, axilla still very tight but lateral trunk much improved  07/23/23: Manual Therapy Made and issued different compression foam options for day and night wear in compression bra: 2 separate pieces of small dotted peach medi foam against 1/4" gray foam placed in thin stockinette for pt to wear at medial and lateral breast, then a chip pack that pt can wear at inferior breast at night. She reports day pieces comfortable upon donning at end of session.  MLD to Lt breast as follows: Short neck, superficial and deep abdominals, Lt inguinal nodes and Lt axillo-inguinal anastomosis, Rt axillary and pectoral nodes, anterior intact thorax sequence, anterior inter-axillary anastomosis, then focused on medial and superior breast, next into Rt S/L for focus to lateral breast redirecting towards lateral anastomosis, then finished retracing all steps in supine.  P/ROM to Lt shoulder during MLD into flex, abd and D2 with scapular depression throughout, pt still with limited end motions due to fascial restrictions but this did seem some improved today STM to Lt axilla and lateral trunk where pt still palpably tight but this does seem improved today.    07/18/23: Manual Therapy MLD to Lt breast as follows: Short neck, superficial and deep abdominals, Lt inguinal nodes and Lt axillo-inguinal anastomosis, Rt axillary and pectoral nodes, anterior intact thorax sequence, anterior inter-axillary anastomosis, then focused on medial and superior breast, then finished retracing all steps. P/ROM to Lt shoulder during MLD into flex, abd and D2 with scapular depression throughout, pt still with limited end motions due to fascial restrictions      PATIENT EDUCATION:  Education details: Supine scapular series with yellow theraband Person educated: Patient Education method: Explanation, Demonstration, Tactile cues, Verbal cues, and Handouts Education comprehension: verbalized  understanding, returned demonstration, and needs further education  HOME EXERCISE PROGRAM: Self MLD, compression  ASSESSMENT:  CLINICAL IMPRESSION: Today began focusing more on Lt shoulder ROM and strength. Pt was told before cancer diagnosis that she has a RTC tear and had PT before CA diagnosis. Encouraged her to resume the exercises she was instructed in when going to [employer]. Pt reports she had stopped doing them because she has been focusing on cancer treatments since diagnosis. She did not have increased pain with new HEP and was reminded not to push pain with any of the HEP as this could be worsening her tear. She verbalized understanding all today. Pt repots shoulder feeling much improved after session.   OBJECTIVE IMPAIRMENTS: decreased knowledge of condition, decreased knowledge of use of DME, increased edema, and increased fascial restrictions.  ACTIVITY LIMITATIONS: sleeping  PARTICIPATION LIMITATIONS: community activity  PERSONAL FACTORS: 1-2 comorbidities: ALND, radiation  are also affecting patient's functional outcome.   REHAB POTENTIAL: Excellent  CLINICAL DECISION MAKING: Evolving/moderate complexity  EVALUATION COMPLEXITY: Low  GOALS: Goals reviewed with patient? Yes  SHORT TERM GOALS: Target date: 08/09/23  Pt will be ind with MLD for the Left breast Baseline: 07/25/23 - Pt is independent with this Goal status: MET  2.  Pt will decrease resting breast pain to 1/10 or less Baseline: 5/10; 07/25/23 - 0/10 at times, but not consistently, end of work day some shooting pains with increased swelling noted Goal status: PARTIALLY MET  3.  Pt will decrease breast edema complaints questionnaire to 45 or less to demonstrate improvements in the breast pain and composition.   Baseline: 68; 07/25/23 - 36 Goal status: ONGOING   PLAN:  PT FREQUENCY: 2x/week  PT DURATION: 4 weeks  PLANNED INTERVENTIONS: 97110-Therapeutic exercises, 97530- Therapeutic activity,  97535- Self Care, 16109- Manual therapy, Patient/Family education, Therapeutic exercises, Therapeutic activity, Neuromuscular re-education, Gait training, and Self Care  PLAN FOR NEXT SESSION: Cont left breast MLD and Lt shoulder ROM stretches and STM to Lt shoulder; review supine scapular series next with theraband.  Denyce Flank, PTA 08/01/2023, 9:04 AM   Over Head Pull: Narrow and Wide Grip   Cancer Rehab [phone]   On back, knees bent, feet flat, band across thighs, elbows straight but relaxed. Pull hands apart (start). Keeping elbows straight, bring arms up and over head, hands toward floor. Keep pull steady on band. Hold momentarily. Return slowly, keeping pull steady, back to start. Then do same with a wider grip on the band (past shoulder width) Repeat _5-10__ times. Band color __yellow____   Side Pull: Double Arm   On back, knees bent, feet flat. Arms perpendicular to body, shoulder level, elbows straight but relaxed. Pull arms out to sides, elbows straight. Resistance band comes across collarbones, hands toward floor. Hold momentarily. Slowly return to starting position. Repeat _5-10__ times. Band color _yellow____   Sword   On back, knees bent, feet flat, left hand on left hip, right hand above left. Pull right arm DIAGONALLY (hip to shoulder) across chest. Bring right arm along head toward floor. Hold momentarily. Slowly return to starting position. Repeat _5-10__ times. Do with left arm. Band color _yellow_____   Shoulder Rotation: Double Arm   On back, knees bent, feet flat, elbows tucked at sides, bent 90, hands palms up. Pull hands apart and down toward floor, keeping elbows near sides. Hold momentarily. Slowly return to starting position. Repeat _5-10__ times. Band color __yellow____

## 2023-08-01 NOTE — Patient Instructions (Addendum)
 Lynn [name]

## 2023-08-02 ENCOUNTER — Other Ambulatory Visit (HOSPITAL_COMMUNITY): Payer: Self-pay

## 2023-08-05 ENCOUNTER — Telehealth: Payer: Self-pay

## 2023-08-05 ENCOUNTER — Encounter: Payer: Self-pay | Admitting: Physician Assistant

## 2023-08-05 ENCOUNTER — Other Ambulatory Visit: Payer: Self-pay

## 2023-08-05 DIAGNOSIS — Z170 Estrogen receptor positive status [ER+]: Secondary | ICD-10-CM

## 2023-08-05 NOTE — Telephone Encounter (Signed)
 Pt called and reports she has new abdominopelvic pain. She had her Zoladex  inj 08/01/23 and reports Friday she started "feeling like my ovaries are twisting and it hurts to walk"   Per MD, order placed for urgent CT abd/pelvis for metastatic disease evaluation. Pt is agreeable. Chat sent to PA team and central scheduling to assist in urgent schedule.

## 2023-08-05 NOTE — Progress Notes (Signed)
Orders placed per MD

## 2023-08-05 NOTE — Telephone Encounter (Signed)
 Pt called and LVM stating she is having intense abd/pelvic cramping and discomfort. In her VM she states it feels like her ovary is twisting. Attempted to call pt to gather more information and possibly recommend urgent call to GYN. LVM for call back .

## 2023-08-08 ENCOUNTER — Ambulatory Visit (HOSPITAL_COMMUNITY)
Admission: RE | Admit: 2023-08-08 | Discharge: 2023-08-08 | Disposition: A | Discharge status: Home / Self Care | Source: Ambulatory Visit | Attending: Hematology and Oncology | Admitting: Hematology and Oncology

## 2023-08-08 ENCOUNTER — Other Ambulatory Visit: Payer: Self-pay | Admitting: Hematology and Oncology

## 2023-08-08 ENCOUNTER — Other Ambulatory Visit: Payer: Self-pay

## 2023-08-08 DIAGNOSIS — C50812 Malignant neoplasm of overlapping sites of left female breast: Secondary | ICD-10-CM | POA: Insufficient documentation

## 2023-08-08 DIAGNOSIS — Z170 Estrogen receptor positive status [ER+]: Secondary | ICD-10-CM | POA: Insufficient documentation

## 2023-08-08 MED ORDER — IOHEXOL 300 MG/ML  SOLN
100.0000 mL | Freq: Once | INTRAMUSCULAR | Status: AC | PRN
  Administered 2023-08-08: 100 mL via INTRAVENOUS

## 2023-08-09 ENCOUNTER — Encounter: Payer: Self-pay | Admitting: Nurse Practitioner

## 2023-08-09 ENCOUNTER — Ambulatory Visit (INDEPENDENT_AMBULATORY_CARE_PROVIDER_SITE_OTHER): Admitting: Nurse Practitioner

## 2023-08-09 ENCOUNTER — Other Ambulatory Visit (HOSPITAL_COMMUNITY): Payer: Self-pay

## 2023-08-09 ENCOUNTER — Other Ambulatory Visit: Payer: Self-pay

## 2023-08-09 VITALS — BP 130/70 | HR 114 | Temp 97.1°F | Ht 67.0 in | Wt 341.0 lb

## 2023-08-09 DIAGNOSIS — F411 Generalized anxiety disorder: Secondary | ICD-10-CM | POA: Diagnosis not present

## 2023-08-09 DIAGNOSIS — E559 Vitamin D deficiency, unspecified: Secondary | ICD-10-CM

## 2023-08-09 DIAGNOSIS — R102 Pelvic and perineal pain: Secondary | ICD-10-CM | POA: Diagnosis not present

## 2023-08-09 DIAGNOSIS — F410 Panic disorder [episodic paroxysmal anxiety] without agoraphobia: Secondary | ICD-10-CM

## 2023-08-09 DIAGNOSIS — I10 Essential (primary) hypertension: Secondary | ICD-10-CM

## 2023-08-09 DIAGNOSIS — E119 Type 2 diabetes mellitus without complications: Secondary | ICD-10-CM | POA: Diagnosis not present

## 2023-08-09 DIAGNOSIS — Z7985 Long-term (current) use of injectable non-insulin antidiabetic drugs: Secondary | ICD-10-CM

## 2023-08-09 MED ORDER — SEMAGLUTIDE (2 MG/DOSE) 8 MG/3ML ~~LOC~~ SOPN
2.0000 mg | PEN_INJECTOR | SUBCUTANEOUS | 2 refills | Status: DC
  Filled 2023-08-09: qty 3, 28d supply, fill #0
  Filled 2023-09-05: qty 3, 28d supply, fill #1
  Filled 2023-10-07: qty 3, 28d supply, fill #2

## 2023-08-09 MED ORDER — LISINOPRIL-HYDROCHLOROTHIAZIDE 10-12.5 MG PO TABS
1.0000 | ORAL_TABLET | Freq: Every day | ORAL | 1 refills | Status: DC
  Filled 2023-08-12: qty 90, 90d supply, fill #0
  Filled 2023-11-06: qty 90, 90d supply, fill #1

## 2023-08-09 MED ORDER — ESCITALOPRAM OXALATE 10 MG PO TABS: 10.0000 mg | ORAL_TABLET | Freq: Every day | ORAL | 1 refills | Status: DC

## 2023-08-09 NOTE — Assessment & Plan Note (Signed)
 Well-controlled with current medication. Initial dizziness resolved by nighttime dosing. Continue lisinopril -hctz 10-12.5mg  daily and coreg  25mg  BID. Recent BMP reviewed from 08/01/23 and showed normal kidney function and potassium.

## 2023-08-09 NOTE — Assessment & Plan Note (Signed)
 Managed with weekly prescription and daily gummies. She is compliant with the regimen. Continue weekly vitamin D  prescription and daily vitamin D  gummies.

## 2023-08-09 NOTE — Assessment & Plan Note (Signed)
 Chronic, stable. She is doing well with the increase of ozempic  to 1mg  weekly and would like to go up to 2mg  weekly. Will send higher dose to pharmacy Continue focusing on healthy eating and exercise. Follow-up in 2 months.

## 2023-08-09 NOTE — Progress Notes (Signed)
 Established Patient Office Visit  Subjective   Patient ID: Lynn [name], female    DOB: [birth year]  Age: 38 y.o. MRN: [id number]  Chief Complaint  Patient presents with   Anxiety and HTN    Follow up, request lab work to check kidneys    HPI  Discussed the use of AI scribe software for clinical note transcription with the patient, who gave verbal consent to proceed.  History of Present Illness   Lynn [name] is a 38 year old female who presents with cramping and pelvic pain.  She has experienced cramping and pelvic pain since last weekend. She reached out to her oncologist and had a CT scan yesterday, and she is awaiting results. She has a history of ovarian torsion, but this time the pain persists. The pain is described as cramping with soreness and pressure. There are no urinary symptoms such as burning or increased frequency beyond what is expected from increased fluid intake. The pain has gotten better over the last few days.   She is currently taking Lexapro  for anxiety, which has improved. She engages in supportive activities such as speaking with a chaplain and participating in online health programs. No side effects from Lexapro  are reported.  Her blood pressure has improved since starting lisinopril , with initial dizziness that resolved after switching to nighttime dosing. She continues to take it before bed without issues.  She is on Ozempic  for diabetes and weight management, recently increased to 1 mg, and reports a positive response with increased energy. She is taking vitamin D  due to low levels and is taking weekly prescriptions along with daily gummies.       ROS See pertinent positives and negatives per HPI.    Objective:     BP 130/70 (BP Location: Left Arm, Patient Position: Sitting, Cuff Size: Large)   Pulse (!) 114   Temp (!) 97.1 F (36.2 C)   Ht 5\' 7"  (1.702 m)   Wt (!) 341 lb (154.7 kg)   SpO2 97%   BMI 53.41 kg/m  BP Readings from Last 3  Encounters:  08/09/23 130/70  08/01/23 139/69  07/15/23 138/72   Wt Readings from Last 3 Encounters:  08/09/23 (!) 341 lb (154.7 kg)  08/01/23 (!) 343 lb 11.2 oz (155.9 kg)  07/15/23 (!) 338 lb 9.6 oz (153.6 kg)      Physical Exam Vitals and nursing note reviewed.  Constitutional:      General: She is not in acute distress.    Appearance: Normal appearance.  HENT:     Head: Normocephalic.  Eyes:     Conjunctiva/sclera: Conjunctivae normal.  Cardiovascular:     Rate and Rhythm: Normal rate and regular rhythm.     Pulses: Normal pulses.     Heart sounds: Normal heart sounds.  Pulmonary:     Effort: Pulmonary effort is normal.     Breath sounds: Normal breath sounds.  Musculoskeletal:     Cervical back: Normal range of motion.  Skin:    General: Skin is warm.  Neurological:     General: No focal deficit present.     Mental Status: She is alert and oriented to person, place, and time.  Psychiatric:        Mood and Affect: Mood normal.        Behavior: Behavior normal.        Thought Content: Thought content normal.        Judgment: Judgment normal.      Assessment &  Plan:   Problem List Items Addressed This Visit       Cardiovascular and Mediastinum   Essential hypertension   Well-controlled with current medication. Initial dizziness resolved by nighttime dosing. Continue lisinopril -hctz 10-12.5mg  daily and coreg  25mg  BID. Recent BMP reviewed from 08/01/23 and showed normal kidney function and potassium.       Relevant Medications   lisinopril -hydrochlorothiazide  (ZESTORETIC ) 10-12.5 MG tablet     Endocrine   Type 2 diabetes mellitus (HCC)   Chronic, stable. She is doing well with the increase of ozempic  to 1mg  weekly and would like to go up to 2mg  weekly. Will send higher dose to pharmacy Continue focusing on healthy eating and exercise. Follow-up in 2 months.       Relevant Medications   Semaglutide , 2 MG/DOSE, 8 MG/3ML SOPN   lisinopril -hydrochlorothiazide   (ZESTORETIC ) 10-12.5 MG tablet     Other   Generalized anxiety disorder with panic attacks   Better managed with Lexapro  and no side effects. She is engaging in supportive counseling. Continue Lexapro  10mg  daily and refill the prescription for 90 days.      Relevant Medications   escitalopram  (LEXAPRO ) 10 MG tablet   Vitamin D  deficiency   Managed with weekly prescription and daily gummies. She is compliant with the regimen. Continue weekly vitamin D  prescription and daily vitamin D  gummies.       Other Visit Diagnoses       Pelvic pain    -  Primary   Recent CT scan showed no acute findings. Pain may relate to past ovarian torsion or post-treatment effects. Follow up with the cancer center as needed.       Return in about 2 months (around 10/09/2023) for CPE.    Odette Benjamin, NP

## 2023-08-09 NOTE — Patient Instructions (Signed)
 It was great to see you!  Let's increase your ozempic  to 2 mg injection weekly   Keep taking the blood pressure medication daily  Let's follow-up in 2 months, sooner if you have concerns.  If a referral was placed today, you will be contacted for an appointment. Please note that routine referrals can sometimes take up to 3-4 weeks to process. Please call our office if you haven't heard anything after this time frame.  Take care,  Rheba Cedar, NP

## 2023-08-09 NOTE — Assessment & Plan Note (Signed)
 Better managed with Lexapro  and no side effects. She is engaging in supportive counseling. Continue Lexapro  10mg  daily and refill the prescription for 90 days.

## 2023-08-12 ENCOUNTER — Encounter: Payer: Self-pay | Admitting: Cardiology

## 2023-08-12 ENCOUNTER — Ambulatory Visit: Attending: Cardiology | Admitting: Cardiology

## 2023-08-12 ENCOUNTER — Other Ambulatory Visit (HOSPITAL_COMMUNITY): Payer: Self-pay

## 2023-08-12 ENCOUNTER — Inpatient Hospital Stay (HOSPITAL_BASED_OUTPATIENT_CLINIC_OR_DEPARTMENT_OTHER): Admitting: Hematology and Oncology

## 2023-08-12 VITALS — BP 136/78 | HR 113 | Ht 67.0 in | Wt 346.0 lb

## 2023-08-12 DIAGNOSIS — C50812 Malignant neoplasm of overlapping sites of left female breast: Secondary | ICD-10-CM | POA: Diagnosis not present

## 2023-08-12 DIAGNOSIS — I10 Essential (primary) hypertension: Secondary | ICD-10-CM | POA: Diagnosis not present

## 2023-08-12 DIAGNOSIS — Z170 Estrogen receptor positive status [ER+]: Secondary | ICD-10-CM

## 2023-08-12 DIAGNOSIS — R0789 Other chest pain: Secondary | ICD-10-CM

## 2023-08-12 DIAGNOSIS — E119 Type 2 diabetes mellitus without complications: Secondary | ICD-10-CM

## 2023-08-12 DIAGNOSIS — C50919 Malignant neoplasm of unspecified site of unspecified female breast: Secondary | ICD-10-CM

## 2023-08-12 NOTE — Patient Instructions (Signed)
 Medication Instructions:  Your physician recommends that you continue on your current medications as directed. Please refer to the Current Medication list given to you today.  *If you need a refill on your cardiac medications before your next appointment, please call your pharmacy*  Lab Work: None If you have labs (blood work) drawn today and your tests are completely normal, you will receive your results only by: MyChart Message (if you have MyChart) OR A paper copy in the mail If you have any lab test that is abnormal or we need to change your treatment, we will call you to review the results.  Testing/Procedures: We will order CT coronary calcium score. It will cost $99.00 and is due at time of scan.  Please call to schedule.    [facility] [address] [place], [place] [address] [phone]  [facility] [address] [place], [place] [address] [phone]   Your physician has requested that you have an echocardiogram. Echocardiography is a painless test that uses sound waves to create images of your heart. It provides your doctor with information about the size and shape of your heart and how well your heart's chambers and valves are working. This procedure takes approximately one hour. There are no restrictions for this procedure. Please do NOT wear cologne, perfume, aftershave, or lotions (deodorant is allowed). Please arrive 15 minutes prior to your appointment time.  Please note: We ask at that you not bring children with you during ultrasound (echo/ vascular) testing. Due to room size and safety concerns, children are not allowed in the ultrasound rooms during exams. Our front office staff cannot provide observation of children in our lobby area while testing is being conducted. An adult accompanying a patient to their appointment will only be allowed in the ultrasound room at the discretion of the ultrasound  technician under special circumstances. We apologize for any inconvenience.   Follow-Up: At [facility], you and your health needs are our priority.  As part of our continuing mission to provide you with exceptional heart care, our providers are all part of one team.  This team includes your primary Cardiologist (physician) and Advanced Practice Providers or APPs (Physician Assistants and Nurse Practitioners) who all work together to provide you with the care you need, when you need it.  Your next appointment:   3 month(s)  Provider:   Ralene Burger, MD    We recommend signing up for the patient portal called "MyChart".  Sign up information is provided on this After Visit Summary.  MyChart is used to connect with patients for Virtual Visits (Telemedicine).  Patients are able to view lab/test results, encounter notes, upcoming appointments, etc.  Non-urgent messages can be sent to your provider as well.   To learn more about what you can do with MyChart, go to [email or web address].   Other Instructions None

## 2023-08-12 NOTE — Progress Notes (Signed)
 Patient Care Team: Odette Benjamin, NP as PCP - General (Internal Medicine) Cameron Cea, MD as Consulting Physician (Hematology and Oncology) Enid Harry, MD as Consulting Physician (General Surgery) Johna Myers, MD as Consulting Physician (Radiation Oncology)  DIAGNOSIS:  Encounter Diagnosis  Name Primary?   Malignant neoplasm of overlapping sites of left breast in female, estrogen receptor positive (HCC) Yes    SUMMARY OF ONCOLOGIC HISTORY: Oncology History  Malignant neoplasm of overlapping sites of left breast in female, estrogen receptor positive (HCC)  05/29/2022 Initial Diagnosis   Palpable abnormalities in the left breast led to mammograms which revealed 3 masses 0.7 cm at 1 o'clock position, 3 cm at 2 o'clock position, additional nodule 8 mm total span 5 cm, 5 abnormal axillary lymph nodes biopsy of the masses and the lymph node were positive for grade 3 IDC with high-grade DCIS with LVI, ER 95%, PR 40%, Ki-67 60%, HER2 2+ by IHC FISH negative   06/15/2022 Cancer Staging   Staging form: Breast, AJCC 8th Edition - Clinical: Stage IIIB (cT3, cN3, cM0, G3, ER+, PR+, HER2-) - Signed by Percival Brace, NP on 06/15/2022 Stage prefix: Initial diagnosis Histologic grading system: 3 grade system   06/15/2022 Breast MRI   IMPRESSION: 1. Multiple sites of biopsy proven malignancy with in the lateral left breast. The overall conglomerate of suspicious findings spans approximately 4.4 x 3.5 x 3.5 cm, and contains all 3 post biopsy marking clips. 2. 10+ morphologically abnormal level I, II, III and Rotter's lymph nodes within the left axilla. 3. Diffuse skin and trabecular thickening on the left, consistent with lymphovascular spread of disease. 4. No MRI evidence of malignancy on the right.   06/20/2022 - 10/09/2022 Chemotherapy   Patient is on Treatment Plan : BREAST ADJUVANT DOSE DENSE AC q14d / PACLitaxel  q7d      Genetic Testing   [facility] Multi-Cancer  Panel+RNA was Negative. Report date is 06/13/2022.  The Multi-Cancer + RNA Panel offered by [facility] includes sequencing and/or deletion/duplication analysis of the following 70 genes:  AIP*, ALK, APC*, ATM*, AXIN2*, BAP1*, BARD1*, BLM*, BMPR1A*, BRCA1*, BRCA2*, BRIP1*, CDC73*, CDH1*, CDK4, CDKN1B*, CDKN2A, CHEK2*, CTNNA1*, DICER1*, EPCAM (del/dup only), EGFR, FH*, FLCN*, GREM1 (promoter dup only), HOXB13, KIT, LZTR1, MAX*, MBD4, MEN1*, MET, MITF, MLH1*, MSH2*, MSH3*, MSH6*, MUTYH*, NF1*, NF2*, NTHL1*, PALB2*, PDGFRA, PMS2*, POLD1*, POLE*, POT1*, PRKAR1A*, PTCH1*, PTEN*, RAD51C*, RAD51D*, RB1*, RET, SDHA* (sequencing only), SDHAF2*, SDHB*, SDHC*, SDHD*, SMAD4*, SMARCA4*, SMARCB1*, SMARCE1*, STK11*, SUFU*, TMEM127*, TP53*, TSC1*, TSC2*, VHL*. RNA analysis is performed for * genes.   11/20/2022 Surgery   A. BREAST, LEFT, LUMPECTOMY:  Fibrosis and slight inflammation consistent with treatment effects  No residual invasive or in situ carcinoma (ypT0)  Margins: All margins negative for carcinoma  Lymphovascular space invasion: Not identified  Biopsy site and biopsy clip  See oncology table   14 LN biopsied negative for cancer.    01/22/2023 - 03/21/2023 Radiation Therapy   Plan Name: Breast_L_BH Site: Breast, Left Technique: 3D Mode: Photon Dose Per Fraction: 1.8 Gy Prescribed Dose (Delivered / Prescribed): 50.4 Gy / 50.4 Gy Prescribed Fxs (Delivered / Prescribed): 28 / 28   Plan Name: Brst_L_SCV_BH Site: Sclav-LT Technique: 3D Mode: Photon Dose Per Fraction: 1.8 Gy Prescribed Dose (Delivered / Prescribed): 50.4 Gy / 50.4 Gy Prescribed Fxs (Delivered / Prescribed): 28 / 28   Plan Name: Brst_L_BH_Bst Site: Breast, Left Technique: 3D Mode: Photon Dose Per Fraction: 2 Gy Prescribed Dose (Delivered / Prescribed): 10 Gy / 10 Gy Prescribed Fxs (  Delivered / Prescribed): 5 / 5   04/2023 -  Anti-estrogen oral therapy   Anastrozole      CHIEF COMPLIANT: Follow-up on anastrozole  therapy and  to review the results of CT scans  HISTORY OF PRESENT ILLNESS:  History of Present Illness Lynn [name] is a 38 year old female with breast cancer who presents for follow-up regarding her treatment regimen.  She recently switched to a higher dose of Verzenio . Headaches have improved since changing from anastrozole  to letrozole . Diarrhea is present but tolerable.  A recent CT scan showed left breast skin thickening post-radiation. There is a benign left adrenal nodule and fatty liver. Her last mammogram in February was normal.  She is scheduled for blood work on Aug 29, 2023.     ALLERGIES:  is allergic to macrobid [nitrofurantoin macrocrystal] and ibuprofen.  MEDICATIONS:  Current Outpatient Medications  Medication Sig Dispense Refill   abemaciclib  (VERZENIO ) 150 MG tablet Take 1 tablet (150 mg total) by mouth 2 (two) times daily. 56 tablet 3   ALPRAZolam  (XANAX ) 0.5 MG tablet TAKE 1 TABLET BY MOUTH AT BEDTIME AS NEEDED FOR ANXIETY (Patient taking differently: Take 0.5 mg by mouth at bedtime as needed for anxiety.) 30 tablet 3   aspirin EC 81 MG tablet Take 81 mg by mouth daily. Swallow whole.     aspirin-acetaminophen -caffeine (EXCEDRIN MIGRAINE) 250-250-65 MG tablet Take 1-2 tablets by mouth 2 (two) times daily as needed for headache or migraine.     Blood Glucose Monitoring Suppl (ACCU-CHEK AVIVA PLUS) w/Device KIT Use to check blood sugar once daily at 6 (six) AM. 1 kit 0   carvedilol  (COREG ) 25 MG tablet Take 1 tablet (25 mg total) by mouth 2 (two) times daily with a meal. 180 tablet 0   carvedilol  (COREG ) 25 MG tablet Take 1 tablet (25 mg total) by mouth 2 (two) times daily with a meal. 180 tablet 0   cyanocobalamin  (VITAMIN B12) 1000 MCG tablet Take 2,000 mcg by mouth daily.     escitalopram  (LEXAPRO ) 10 MG tablet Take 1 tablet (10 mg total) by mouth daily. 90 tablet 1   esomeprazole (NEXIUM) 20 MG capsule Take 20 mg by mouth daily at 12 noon.     gabapentin  (NEURONTIN ) 100 MG  capsule Take 1 capsule (100 mg total) by mouth 3 (three) times daily. 90 capsule 3   glucose blood (ACCU-CHEK AVIVA PLUS) test strip Use as instructed (Patient taking differently: 1 each by Other route See admin instructions. Use as instructed) 100 each 12   glucose blood (ACCU-CHEK GUIDE TEST) test strip Use to check blood sugar once a day at 6 am. (Patient taking differently: 1 each by Other route See admin instructions. Use to check blood sugar once a day at 6 am.) 50 strip 0   goserelin (ZOLADEX ) 3.6 MG injection Inject 3.6 mg into the skin every 28 (twenty-eight) days.     Lancets (ONETOUCH DELICA PLUS LANCET33G) MISC Use to check blood sugar once a day at 6 am. (Patient taking differently: 1 each by Other route daily.) 100 each 0   letrozole  (FEMARA ) 2.5 MG tablet Take 1 tablet (2.5 mg total) by mouth daily. 90 tablet 3   lisinopril -hydrochlorothiazide  (ZESTORETIC ) 10-12.5 MG tablet Take 1 tablet by mouth daily. 90 tablet 1   ONE TOUCH CLUB LANCETS MISC 1 each by Does not apply route daily. 100 each 11   prochlorperazine  (COMPAZINE ) 10 MG tablet Take 1 tablet (10 mg total) by mouth every 6 (six) hours as  needed for nausea or vomiting. 30 tablet 2   Semaglutide , 2 MG/DOSE, 8 MG/3ML SOPN Inject 2 mg as directed once a week. 3 mL 2   Vitamin D , Ergocalciferol , (DRISDOL ) 1.25 MG (50000 UNIT) CAPS capsule Take 1 capsule (50,000 Units total) by mouth every 7 (seven) days. 12 capsule 0   No current facility-administered medications for this visit.    PHYSICAL EXAMINATION: ECOG PERFORMANCE STATUS: 1 - Symptomatic but completely ambulatory  There were no vitals filed for this visit. There were no vitals filed for this visit.  Physical Exam   (exam performed in the presence of a chaperone)  LABORATORY DATA:  I have reviewed the data as listed    Latest Ref Rng & Units 08/01/2023    1:12 PM 06/06/2023   10:25 AM 05/09/2023    2:38 PM  CMP  Glucose 70 - 99 mg/dL 469  629  528   BUN 6 - 20 mg/dL  16  13  16    Creatinine 0.44 - 1.00 mg/dL 4.13  2.44  0.10   Sodium 135 - 145 mmol/L 133  136  134   Potassium 3.5 - 5.1 mmol/L 4.0  4.4  4.1   Chloride 98 - 111 mmol/L 100  103  102   CO2 22 - 32 mmol/L 28  29  26    Calcium 8.9 - 10.3 mg/dL 9.2  8.8  9.2   Total Protein 6.5 - 8.1 g/dL 7.3  6.9  7.4   Total Bilirubin 0.0 - 1.2 mg/dL 0.5  0.6  0.6   Alkaline Phos 38 - 126 U/L 69  56  62   AST 15 - 41 U/L 29  19  21    ALT 0 - 44 U/L 32  22  22     Lab Results  Component Value Date   WBC 4.0 08/01/2023   HGB 10.9 (L) 08/01/2023   HCT 30.4 (L) 08/01/2023   MCV 82.8 08/01/2023   PLT 197 08/01/2023   NEUTROABS 2.5 08/01/2023    ASSESSMENT & PLAN:  Malignant neoplasm of overlapping sites of left breast in female, 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   06/27/2022: CT CAP: Left breast cancer, 2 left axillary lymph nodes, left adnexal cystic lesion 4 cm    Treatment Plan:  1. Neoadjuvant chemotherapy with Adriamycin  and Cytoxan  dose dense 4 followed by Taxol  weekly 10 completed 10/16/2022 2. 11/20/2022: Left lumpectomy: No residual cancer, margins negative, 0/14 lymph nodes 3. Followed by adjuvant radiation therapy 01/22/2023-03/21/2023 4.  Followed by antiestrogen therapy to start 04/03/2023 ____________________________________________________ Antiestrogen therapy: ovarian function suppression with Zoladex  along with anastrozole  with Verzenio  (started 04/10/23)X 2 years, switched to letrozole  06/06/2023   Current dose of Verzenio : 150 p.o. twice daily (to start 08/12/2023) Verzinio toxicities: Headaches: Switch to letrozole   intermittent diarrhea and constipation Fatigue Labs and follow-up in 2 months ------------------------------------- Assessment and Plan Assessment & Plan Malignant neoplasm of overlapping sites of left breast, estrogen receptor positive On higher dose Verzenio  (150  mg).   08/08/2023: CT abdomen scan: benign left adrenal nodule, fatty liver, post-radiation changes. February mammogram normal. - Continue Verzenio  150 mg as tolerated. - Monitor for diarrhea, report if worsens for dose adjustment. - Schedule follow-up mammogram end of year. - Follow-up blood work on 29th.       No orders of the defined types were placed in this encounter.  The patient has a good understanding of the  overall plan. she agrees with it. she will call with any problems that may develop before the next visit here. Total time spent: 30 mins including face to face time and time spent for planning, charting and co-ordination of care   Viinay K Lanee Chain, MD 08/12/23

## 2023-08-12 NOTE — Progress Notes (Signed)
 Will  Cardiology Consultation:    Date:  08/12/2023   ID:  Lynn [name], DOB March 19, 1986, MRN [id number]  PCP:  Odette Benjamin, NP  Cardiologist:  Ralene Burger, MD   Referring MD: Odette Benjamin, NP   Chief Complaint  Patient presents with   Chest Pain  Chest  History of Present Illness:    Lynn [name] is a 38 y.o. female who is being seen today for the evaluation of chest pain at the request of McElwee, Lauren A, NP.  Past medical history significant for morbid obesity, diabetes, essential hypertension, obstructive sleep apnea she was referred to us  because of episode of chest pain.  Apparently she initially had appointment but year ago but she was found to have a breast cancer after that she required surgery chemotherapy radiation did quite well with this and overall seems to be doing well.  She described pain as sharp stabbing lasting only for short period of time located on the left side of her chest.  There is no tightness squeezing pressure burning chest.  She is trying to work on weight loss she use Ozempic  which helps with that she lost few pounds already, on top of that she is trying to be more active she get tired easily short of breath but no chest pain tightness squeezing pressure burning chest.  There is family history of coronary artery disease, she quit smoking couple years ago after that she vape but finally recently she quit completely vaping and smoking which I congratulated her for.  She is a not exercising on a regular basis but trying to be able be more active  Past Medical History:  Diagnosis Date   Anemia    Cancer (HCC) 2024   L breast   Carpal tunnel syndrome, left    Depression    Diabetes (HCC)    Dysrhythmia    Palpitations   GERD (gastroesophageal reflux disease)    Hypertension    Iron deficiency    Panic attacks    PCOS (polycystic ovarian syndrome)    Personal history of chemotherapy    Pneumonia    Port-A-Cath in place 07/31/2022    Tachycardia    Umbilical hernia     Past Surgical History:  Procedure Laterality Date   ADENOIDECTOMY     As a child   AXILLARY LYMPH NODE DISSECTION Left 11/20/2022   Procedure: LEFT AXILLARY DISSECTION;  Surgeon: Enid Harry, MD;  Location: [facility] OR;  Service: General;  Laterality: Left;   BREAST BIOPSY Left 05/29/2022   US  LT BREAST BX W LOC DEV 1ST LESION IMG BX SPEC US  GUIDE 05/29/2022 [facility]   BREAST BIOPSY Left 05/29/2022   US  LT BREAST BX W LOC DEV EA ADD LESION IMG BX SPEC US  GUIDE 05/29/2022 [facility]   BREAST BIOPSY Left 06/06/2022   MM LT BREAST BX W LOC DEV 1ST LESION IMAGE BX SPEC STEREO GUIDE 06/06/2022 [facility]   BREAST BIOPSY  11/19/2022   MM LT RADIOACTIVE SEED LOC MAMMO GUIDE 11/19/2022 [facility]   BREAST BIOPSY  11/19/2022   MM LT RADIOACTIVE SEED EA ADD LESION LOC MAMMO GUIDE 11/19/2022 [facility]   BREAST BIOPSY  11/19/2022   MM LT RADIOACTIVE SEED EA ADD LESION LOC MAMMO GUIDE 11/19/2022 [facility]   BREAST LUMPECTOMY WITH RADIOACTIVE SEED AND AXILLARY LYMPH NODE DISSECTION Left 11/20/2022   Procedure: LEFT BREAST LUMPECTOMY WITH RADIOACTIVE SEED;  Surgeon: Enid Harry, MD;  Location: [facility] OR;  Service: General;  Laterality: Left;  PEC BLOCK   CESAREAN SECTION  2015   PORT-A-CATH REMOVAL Right 11/20/2022   Procedure: REMOVAL PORT-A-CATH;  Surgeon: Enid Harry, MD;  Location: [facility] OR;  Service: General;  Laterality: Right;   PORTACATH PLACEMENT N/A 06/19/2022   Procedure: INSERTION PORT-A-CATH;  Surgeon: Enid Harry, MD;  Location: [facility] OR;  Service: General;  Laterality: N/A;   TONSILLECTOMY     As a child    Current Medications: Current Meds  Medication Sig   abemaciclib  (VERZENIO ) 150 MG tablet Take 1 tablet (150 mg total) by mouth 2 (two) times daily.   ALPRAZolam  (XANAX ) 0.5 MG tablet TAKE 1 TABLET BY MOUTH AT BEDTIME AS NEEDED FOR ANXIETY (Patient taking differently: Take 0.5 mg by mouth at  bedtime as needed for anxiety.)   aspirin EC 81 MG tablet Take 81 mg by mouth daily. Swallow whole.   aspirin-acetaminophen -caffeine (EXCEDRIN MIGRAINE) 250-250-65 MG tablet Take 1-2 tablets by mouth 2 (two) times daily as needed for headache or migraine.   Blood Glucose Monitoring Suppl (ACCU-CHEK AVIVA PLUS) w/Device KIT Use to check blood sugar once daily at 6 (six) AM.   carvedilol  (COREG ) 25 MG tablet Take 1 tablet (25 mg total) by mouth 2 (two) times daily with a meal.   carvedilol  (COREG ) 25 MG tablet Take 1 tablet (25 mg total) by mouth 2 (two) times daily with a meal.   cyanocobalamin  (VITAMIN B12) 1000 MCG tablet Take 2,000 mcg by mouth daily.   esomeprazole (NEXIUM) 20 MG capsule Take 20 mg by mouth daily at 12 noon.   gabapentin  (NEURONTIN ) 100 MG capsule Take 1 capsule (100 mg total) by mouth 3 (three) times daily.   glucose blood (ACCU-CHEK AVIVA PLUS) test strip Use as instructed (Patient taking differently: 1 each by Other route See admin instructions. Use as instructed)   glucose blood (ACCU-CHEK GUIDE TEST) test strip Use to check blood sugar once a day at 6 am. (Patient taking differently: 1 each by Other route See admin instructions. Use to check blood sugar once a day at 6 am.)   goserelin (ZOLADEX ) 3.6 MG injection Inject 3.6 mg into the skin every 28 (twenty-eight) days.   Lancets (ONETOUCH DELICA PLUS LANCET33G) MISC Use to check blood sugar once a day at 6 am. (Patient taking differently: 1 each by Other route daily.)   letrozole  (FEMARA ) 2.5 MG tablet Take 1 tablet (2.5 mg total) by mouth daily.   ONE TOUCH CLUB LANCETS MISC 1 each by Does not apply route daily.   prochlorperazine  (COMPAZINE ) 10 MG tablet Take 1 tablet (10 mg total) by mouth every 6 (six) hours as needed for nausea or vomiting.   Vitamin D , Ergocalciferol , (DRISDOL ) 1.25 MG (50000 UNIT) CAPS capsule Take 1 capsule (50,000 Units total) by mouth every 7 (seven) days.   [DISCONTINUED] escitalopram  (LEXAPRO ) 10  MG tablet Take 1 tablet (10 mg total) by mouth daily.   [DISCONTINUED] lisinopril -hydrochlorothiazide  (ZESTORETIC ) 10-12.5 MG tablet Take 1 tablet by mouth daily.   [DISCONTINUED] Semaglutide , 1 MG/DOSE, 4 MG/3ML SOPN Inject 1 mg as directed once a week.     Allergies:   Macrobid [nitrofurantoin macrocrystal] and Ibuprofen   Social History   Socioeconomic History   Marital status: Married    Spouse name: Not on file   Number of children: 1   Years of education: some college   Highest education level: Some college, no degree  Occupational History   Not on file  Tobacco Use   Smoking status:  Former    Current packs/day: 0.00    Average packs/day: 1 pack/day for 7.0 years (7.0 ttl pk-yrs)    Types: Cigarettes, E-cigarettes    Start date: 03/20/2007    Quit date: 10/29/2021    Years since quitting: 1.7   Smokeless tobacco: Never  Vaping Use   Vaping status: Former   Quit date: 04/03/2019  Substance and Sexual Activity   Alcohol use: Not Currently    Comment: rarely   Drug use: Yes    Types: Marijuana   Sexual activity: Yes    Birth control/protection: Condom  Other Topics Concern   Not on file  Social History Narrative      Social Drivers of Health   Financial Resource Strain: Medium Risk (04/29/2023)   Overall Financial Resource Strain (CARDIA)    Difficulty of Paying Living Expenses: Somewhat hard  Food Insecurity: Food Insecurity Present (04/29/2023)   Hunger Vital Sign    Worried About Running Out of Food in the Last Year: Sometimes true    Ran Out of Food in the Last Year: Sometimes true  Transportation Needs: No Transportation Needs (04/29/2023)   PRAPARE - Administrator, Civil Service (Medical): No    Lack of Transportation (Non-Medical): No  Physical Activity: Sufficiently Active (04/29/2023)   Exercise Vital Sign    Days of Exercise per Week: 5 days    Minutes of Exercise per Session: 30 min  Stress: Stress Concern Present (04/29/2023)   [employer] of Occupational Health - Occupational Stress Questionnaire    Feeling of Stress : To some extent  Social Connections: Socially Integrated (04/29/2023)   Social Connection and Isolation Panel [NHANES]    Frequency of Communication with Friends and Family: More than three times a week    Frequency of Social Gatherings with Friends and Family: Once a week    Attends Religious Services: More than 4 times per year    Active Member of [employer] or Organizations: Yes    Attends Engineer, structural: More than 4 times per year    Marital Status: Married     Family History: The patient's family history includes Alcohol abuse in her maternal grandfather, maternal grandmother, and sister; Arthritis in her maternal grandfather, maternal grandmother, mother, paternal grandfather, paternal grandmother, sister, and sister; Asthma in her mother; Bladder Cancer in her maternal aunt and mother; COPD in her father, maternal grandmother, and mother; Colon cancer (age of onset: 54 21 66) in her maternal aunt; Depression in her mother, sister, and sister; Diabetes in her maternal grandmother, mother, paternal grandmother, sister, and sister; Early death in her maternal grandfather; Hearing loss in her maternal grandmother and paternal grandfather; Heart attack in her maternal grandmother, paternal grandfather, and paternal grandmother; Heart attack (age of onset: 38) in her sister; Heart attack (age of onset: 69) in her father; Heart attack (age of onset: 50) in her mother; Heart attack (age of onset: 59) in her maternal grandfather; Heart disease in her father, maternal grandfather, maternal grandmother, mother, paternal grandfather, paternal grandmother, and sister; Heart failure in her maternal grandfather, maternal grandmother, mother, paternal grandfather, and paternal grandmother; Hyperlipidemia in her father, maternal grandfather, maternal grandmother, mother, paternal grandfather, and paternal  grandmother; Hypertension in her father, maternal grandfather, maternal grandmother, mother, paternal grandfather, paternal grandmother, and sister; Intellectual disability in her paternal grandmother; Kidney disease in her mother, paternal grandmother, and sister; Lung cancer (age of onset: 68) in her paternal aunt; Melanoma (age of onset: 71 -  19) in her paternal aunt; Mental illness in her father, maternal grandfather, maternal grandmother, mother, paternal grandfather, paternal grandmother, and sister; Miscarriages / Stillbirths in her maternal grandmother, paternal grandmother, and sister; Other (age of onset: 2 - 11) in her maternal aunt; Stroke in her maternal grandmother; Transient ischemic attack in her sister. There is no history of Breast cancer, Prostate cancer, Ovarian cancer, Endometrial cancer, or Pancreatic cancer. ROS:   Please see the history of present illness.    All 14 point review of systems negative except as described per history of present illness.  EKGs/Labs/Other Studies Reviewed:    The following studies were reviewed today:   EKG:  EKG Interpretation Date/Time:  Monday Aug 12 2023 15:41:16 EDT Ventricular Rate:  102 PR Interval:  162 QRS Duration:  86 QT Interval:  336 QTC Calculation: 437 R Axis:   -13  Text Interpretation: Sinus tachycardia Low voltage QRS When compared with ECG of 04-Sep-2022 09:45, No significant change was found Confirmed by Ralene Burger [phone]) on 08/12/2023 4:07:33 PM    Recent Labs: 08/01/2023: ALT 32; BUN 16; Creatinine 0.90; Hemoglobin 10.9; Platelet Count 197; Potassium 4.0; Sodium 133  Recent Lipid Panel    Component Value Date/Time   CHOL 148 07/11/2023 0855   TRIG 231.0 (H) 07/11/2023 0855   HDL 56.50 07/11/2023 0855   CHOLHDL 3 07/11/2023 0855   VLDL 46.2 (H) 07/11/2023 0855   LDLCALC 46 07/11/2023 0855    Physical Exam:    VS:  BP 136/78 (BP Location: Right Arm, Patient Position: Sitting)   Pulse (!) 113   Ht 5\' 7"   (1.702 m)   Wt (!) 346 lb (156.9 kg)   SpO2 95%   BMI 54.19 kg/m     Wt Readings from Last 3 Encounters:  08/12/23 (!) 346 lb (156.9 kg)  08/09/23 (!) 341 lb (154.7 kg)  08/01/23 (!) 343 lb 11.2 oz (155.9 kg)     GEN:  Well nourished, well developed in no acute distress HEENT: Normal NECK: No JVD; No carotid bruits LYMPHATICS: No lymphadenopathy CARDIAC: RRR, no murmurs, no rubs, no gallops RESPIRATORY:  Clear to auscultation without rales, wheezing or rhonchi  ABDOMEN: Soft, non-tender, non-distended MUSCULOSKELETAL:  No edema; No deformity  SKIN: Warm and dry NEUROLOGIC:  Alert and oriented x 3 PSYCHIATRIC:  Normal affect   ASSESSMENT:    1. Essential hypertension   2. Type 2 diabetes mellitus without complication, without long-term current use of insulin  (HCC)   3. Atypical chest pain   4. Malignant neoplasm of female breast, unspecified estrogen receptor status, unspecified laterality, unspecified site of breast (HCC)   5. Malignant neoplasm of overlapping sites of left breast in female, estrogen receptor positive (HCC)    PLAN:    In order of problems listed above:  Chest pain very atypical I doubt we dealing with coronary artery disease here.  She went through surgery and radiation chemo with no difficulties.  I will ask her to have a calcium score to have a better idea about stratification for his risk factors.  Benign essential hypertension doing well stable from that point of view we will continue present management. Dyspnea on exertion multifactorial clearly obesity play significant role.  I will ask her to have echocardiogram done to assess left ventricular ejection fraction. Previously she was evaluated for sinus tachycardia.  She was put on carvedilol  25 twice daily as that seems to be controlling rhythm quite nicely I suspect the reason for her tachyarrhythmia is probably  her obesity.  But we will wait for echocardiogram before Maggie final opinion about  that   Medication Adjustments/Labs and Tests Ordered: Current medicines are reviewed at length with the patient today.  Concerns regarding medicines are outlined above.  Orders Placed This Encounter  Procedures   CT CARDIAC SCORING   EKG 12-Lead   ECHOCARDIOGRAM COMPLETE   No orders of the defined types were placed in this encounter.   Signed, Manfred Seed, MD, [facility] 08/12/2023 4:49 PM    [facility]

## 2023-08-12 NOTE — Assessment & Plan Note (Signed)
 stage IIIB ER/PR positive breast cancer diagnosed in 06/2022 currently receiving neoadjuvant chemotherapy.   06/27/2022: CT CAP: Left breast cancer, 2 left axillary lymph nodes, left adnexal cystic lesion 4 cm    Treatment Plan:  1. Neoadjuvant chemotherapy with Adriamycin  and Cytoxan  dose dense 4 followed by Taxol  weekly 10 completed 10/16/2022 2. 11/20/2022: Left lumpectomy: No residual cancer, margins negative, 0/14 lymph nodes 3. Followed by adjuvant radiation therapy 01/22/2023-03/21/2023 4.  Followed by antiestrogen therapy to start 04/03/2023 ____________________________________________________ Antiestrogen therapy: I recommended ovarian function suppression with Zoladex  along with anastrozole  with Verzenio  (started 04/10/23)X 2 years, switched to letrozole  06/06/2023   Current dose of Verzenio : 150 p.o. twice daily Verzinio toxicities: Headaches: Switch to letrozole   intermittent diarrhea and constipation Fatigue Labs and follow-up in 2 months

## 2023-08-13 ENCOUNTER — Other Ambulatory Visit (HOSPITAL_COMMUNITY): Payer: Self-pay

## 2023-08-14 ENCOUNTER — Other Ambulatory Visit (HOSPITAL_COMMUNITY): Payer: Self-pay

## 2023-08-14 ENCOUNTER — Other Ambulatory Visit: Payer: Self-pay

## 2023-08-14 NOTE — Progress Notes (Signed)
 Patient's Verzenio  has increased in dose. With new increase, insurance is requiring a split fill for the first two months. This last through ~end of July 2025.   Oral Oncology Patient Advocate Encounter  After completing a benefits investigation, prior authorization for Verzenio  is not required at this time through [employer] Commercial Insurance.  Patient's copay is $0.00 billing Medicaid Secondary.    Hansel Ley, CPhT Pharmacy Technician Coordinator [facility] [phone] (Ph) 08/14/2023 10:07 AM

## 2023-08-16 ENCOUNTER — Encounter: Payer: Self-pay | Admitting: *Deleted

## 2023-08-16 NOTE — Progress Notes (Signed)
 Guardant Reveal Renewal orders placed per MD request.

## 2023-08-19 ENCOUNTER — Ambulatory Visit: Payer: BC Managed Care – PPO

## 2023-08-20 ENCOUNTER — Ambulatory Visit: Payer: BC Managed Care – PPO

## 2023-08-20 VITALS — Wt 346.1 lb

## 2023-08-20 DIAGNOSIS — Z483 Aftercare following surgery for neoplasm: Secondary | ICD-10-CM

## 2023-08-20 NOTE — Therapy (Signed)
 OUTPATIENT PHYSICAL THERAPY SOZO SCREENING NOTE   Patient Name: Lynn [name] MRN: [id number] DOB:January 14, 1986, 38 y.o., female Today's Date: 08/20/2023  PCP: Odette Benjamin, NP REFERRING PROVIDER: Cameron Cea, MD   PT End of Session - 08/20/23 0911     Visit Number 8   # unchanged due to screen only   PT Start Time 0905    PT Stop Time 0910    PT Time Calculation (min) 5 min    Activity Tolerance Patient tolerated treatment well    Behavior During Therapy [facility] for tasks assessed/performed             Past Medical History:  Diagnosis Date   Anemia    Cancer (HCC) 2024   L breast   Carpal tunnel syndrome, left    Depression    Diabetes (HCC)    Dysrhythmia    Palpitations   GERD (gastroesophageal reflux disease)    Hypertension    Iron deficiency    Panic attacks    PCOS (polycystic ovarian syndrome)    Personal history of chemotherapy    Pneumonia    Port-A-Cath in place 07/31/2022   Tachycardia    Umbilical hernia    Past Surgical History:  Procedure Laterality Date   ADENOIDECTOMY     As a child   AXILLARY LYMPH NODE DISSECTION Left 11/20/2022   Procedure: LEFT AXILLARY DISSECTION;  Surgeon: Enid Harry, MD;  Location: [facility] OR;  Service: General;  Laterality: Left;   BREAST BIOPSY Left 05/29/2022   US  LT BREAST BX W LOC DEV 1ST LESION IMG BX SPEC US  GUIDE 05/29/2022 [facility]   BREAST BIOPSY Left 05/29/2022   US  LT BREAST BX W LOC DEV EA ADD LESION IMG BX SPEC US  GUIDE 05/29/2022 [facility]   BREAST BIOPSY Left 06/06/2022   MM LT BREAST BX W LOC DEV 1ST LESION IMAGE BX SPEC STEREO GUIDE 06/06/2022 [facility]   BREAST BIOPSY  11/19/2022   MM LT RADIOACTIVE SEED LOC MAMMO GUIDE 11/19/2022 [facility]   BREAST BIOPSY  11/19/2022   MM LT RADIOACTIVE SEED EA ADD LESION LOC MAMMO GUIDE 11/19/2022 [facility]   BREAST BIOPSY  11/19/2022   MM LT RADIOACTIVE SEED EA ADD LESION LOC MAMMO GUIDE 11/19/2022 [facility]    BREAST LUMPECTOMY WITH RADIOACTIVE SEED AND AXILLARY LYMPH NODE DISSECTION Left 11/20/2022   Procedure: LEFT BREAST LUMPECTOMY WITH RADIOACTIVE SEED;  Surgeon: Enid Harry, MD;  Location: [facility] OR;  Service: General;  Laterality: Left;  PEC BLOCK   CESAREAN SECTION  2015   PORT-A-CATH REMOVAL Right 11/20/2022   Procedure: REMOVAL PORT-A-CATH;  Surgeon: Enid Harry, MD;  Location: [facility] OR;  Service: General;  Laterality: Right;   PORTACATH PLACEMENT N/A 06/19/2022   Procedure: INSERTION PORT-A-CATH;  Surgeon: Enid Harry, MD;  Location: [facility] OR;  Service: General;  Laterality: N/A;   TONSILLECTOMY     As a child   Patient Active Problem List   Diagnosis Date Noted   Anemia 07/11/2023   Breast cancer (HCC) 11/20/2022   Genetic testing 06/15/2022   Anxiety and depression 06/11/2022   Malignant neoplasm of overlapping sites of left breast in female, estrogen receptor positive (HCC) 06/04/2022   Syncope and collapse 03/12/2022   Umbilical hernia without obstruction and without gangrene 03/12/2022   Vitamin D  deficiency 03/12/2022   Cervical radicular pain 12/07/2021   Snoring 12/07/2021   Other fatigue 12/07/2021   Atypical chest pain 08/15/2021   Epigastric pain 08/15/2021   Acute pain  of both shoulders 04/21/2021   Nerve pain 04/21/2021   Morbid obesity (HCC) 10/13/2020   Essential hypertension 03/12/2019   Chronic tachycardia 03/12/2019   Generalized anxiety disorder with panic attacks 03/12/2019   Family history of heart attack 03/12/2019   LVH (left ventricular hypertrophy) due to hypertensive disease, without heart failure 03/12/2019   Type 2 diabetes mellitus (HCC) 03/12/2019    REFERRING DIAG: left breast cancer at risk for lymphedema  THERAPY DIAG: Aftercare following surgery for neoplasm  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 She has  a BMI > 50, hypertension, and diabetes. Left rotator cuff tear; needs surgery but is now postponed. Pt had neoadjuvant chemotherapy and she is s/p a Left lumpectomy with ALND and 0+/14 LN's. Completed radiation 03/25/23. Noted breast pain at [facility].   PRECAUTIONS: left UE Lymphedema risk, None  SUBJECTIVE: Pt returns for her 3 month L-Dex screen.   PAIN:  Are you having pain? No  SOZO SCREENING: Patient was assessed today using the SOZO machine to determine the lymphedema index score. This was compared to her baseline score. It was determined that she is within the recommended range when compared to her baseline and no further action is needed at this time. She will continue SOZO screenings. These are done every 3 months for 2 years post operatively followed by every 6 months for 2 years, and then annually.   L-DEX FLOWSHEETS - 08/20/23 0900       L-DEX LYMPHEDEMA SCREENING   Measurement Type Unilateral    L-DEX MEASUREMENT EXTREMITY Upper Extremity    POSITION  Standing    DOMINANT SIDE Right    At Risk Side Left    BASELINE SCORE (UNILATERAL) 1.8    L-DEX SCORE (UNILATERAL) 0.7    VALUE CHANGE (UNILAT) -1.1               Denyce Flank, PTA 08/20/2023, 9:13 AM

## 2023-08-21 ENCOUNTER — Other Ambulatory Visit (HOSPITAL_COMMUNITY): Payer: Self-pay

## 2023-08-21 ENCOUNTER — Other Ambulatory Visit: Payer: Self-pay | Admitting: Nurse Practitioner

## 2023-08-21 ENCOUNTER — Other Ambulatory Visit: Payer: Self-pay

## 2023-08-21 DIAGNOSIS — R000 Tachycardia, unspecified: Secondary | ICD-10-CM

## 2023-08-21 DIAGNOSIS — I10 Essential (primary) hypertension: Secondary | ICD-10-CM

## 2023-08-21 NOTE — Progress Notes (Signed)
 Specialty Pharmacy Refill Coordination Note  Lynn [name] is a 38 y.o. female contacted today regarding refills of specialty medication(s) Abemaciclib  (VERZENIO )   Patient requested Delivery   Delivery date: 08/22/23   Verified address: [address]  [place] [address]   Medication will be filled on 08/21/23.

## 2023-08-21 NOTE — Telephone Encounter (Signed)
 Requesting: CARVEDILOL  25MG  TABLETS  Last Visit: 08/09/2023 Next Visit: 10/08/2023 Last Refill: 07/22/2023  Please Advise

## 2023-08-22 ENCOUNTER — Ambulatory Visit

## 2023-08-22 DIAGNOSIS — I890 Lymphedema, not elsewhere classified: Secondary | ICD-10-CM | POA: Diagnosis not present

## 2023-08-22 DIAGNOSIS — C50412 Malignant neoplasm of upper-outer quadrant of left female breast: Secondary | ICD-10-CM

## 2023-08-22 DIAGNOSIS — Z483 Aftercare following surgery for neoplasm: Secondary | ICD-10-CM

## 2023-08-22 DIAGNOSIS — G8929 Other chronic pain: Secondary | ICD-10-CM

## 2023-08-22 DIAGNOSIS — M25512 Pain in left shoulder: Secondary | ICD-10-CM | POA: Diagnosis not present

## 2023-08-22 DIAGNOSIS — Z170 Estrogen receptor positive status [ER+]: Secondary | ICD-10-CM | POA: Diagnosis not present

## 2023-08-22 NOTE — Therapy (Signed)
 OUTPATIENT PHYSICAL THERAPY  UPPER EXTREMITY ONCOLOGY TREATMENT  Patient Name: Lynn [name] MRN: [id number] DOB:June 07, 1985, 38 y.o., female Today's Date: 08/22/2023  END OF SESSION:  PT End of Session - 08/22/23 0812     Visit Number 9    Number of Visits 17    Date for PT Re-Evaluation 09/19/23    Authorization Type 6 visits 07/05/23-09/02/23; 5 visits 07/26/23-09/23/23    Authorization - Visit Number 3    Authorization - Number of Visits 5    PT Start Time 0810   pt arrived late   PT Stop Time 0901    PT Time Calculation (min) 51 min    Activity Tolerance Patient tolerated treatment well    Behavior During Therapy [facility] for tasks assessed/performed              Past Medical History:  Diagnosis Date   Anemia    Cancer (HCC) 2024   L breast   Carpal tunnel syndrome, left    Depression    Diabetes (HCC)    Dysrhythmia    Palpitations   GERD (gastroesophageal reflux disease)    Hypertension    Iron deficiency    Panic attacks    PCOS (polycystic ovarian syndrome)    Personal history of chemotherapy    Pneumonia    Port-A-Cath in place 07/31/2022   Tachycardia    Umbilical hernia    Past Surgical History:  Procedure Laterality Date   ADENOIDECTOMY     As a child   AXILLARY LYMPH NODE DISSECTION Left 11/20/2022   Procedure: LEFT AXILLARY DISSECTION;  Surgeon: Enid Harry, MD;  Location: [facility] OR;  Service: General;  Laterality: Left;   BREAST BIOPSY Left 05/29/2022   US  LT BREAST BX W LOC DEV 1ST LESION IMG BX SPEC US  GUIDE 05/29/2022 [facility]   BREAST BIOPSY Left 05/29/2022   US  LT BREAST BX W LOC DEV EA ADD LESION IMG BX SPEC US  GUIDE 05/29/2022 [facility]   BREAST BIOPSY Left 06/06/2022   MM LT BREAST BX W LOC DEV 1ST LESION IMAGE BX SPEC STEREO GUIDE 06/06/2022 [facility]   BREAST BIOPSY  11/19/2022   MM LT RADIOACTIVE SEED LOC MAMMO GUIDE 11/19/2022 [facility]   BREAST BIOPSY  11/19/2022   MM LT RADIOACTIVE SEED EA ADD LESION  LOC MAMMO GUIDE 11/19/2022 [facility]   BREAST BIOPSY  11/19/2022   MM LT RADIOACTIVE SEED EA ADD LESION LOC MAMMO GUIDE 11/19/2022 [facility]   BREAST LUMPECTOMY WITH RADIOACTIVE SEED AND AXILLARY LYMPH NODE DISSECTION Left 11/20/2022   Procedure: LEFT BREAST LUMPECTOMY WITH RADIOACTIVE SEED;  Surgeon: Enid Harry, MD;  Location: [facility] OR;  Service: General;  Laterality: Left;  PEC BLOCK   CESAREAN SECTION  2015   PORT-A-CATH REMOVAL Right 11/20/2022   Procedure: REMOVAL PORT-A-CATH;  Surgeon: Enid Harry, MD;  Location: [facility] OR;  Service: General;  Laterality: Right;   PORTACATH PLACEMENT N/A 06/19/2022   Procedure: INSERTION PORT-A-CATH;  Surgeon: Enid Harry, MD;  Location: [facility] OR;  Service: General;  Laterality: N/A;   TONSILLECTOMY     As a child   Patient Active Problem List   Diagnosis Date Noted   Anemia 07/11/2023   Breast cancer (HCC) 11/20/2022   Genetic testing 06/15/2022   Anxiety and depression 06/11/2022   Malignant neoplasm of overlapping sites of left breast in female, estrogen receptor positive (HCC) 06/04/2022   Syncope and collapse 03/12/2022   Umbilical hernia without obstruction and without gangrene 03/12/2022  Vitamin D  deficiency 03/12/2022   Cervical radicular pain 12/07/2021   Snoring 12/07/2021   Other fatigue 12/07/2021   Atypical chest pain 08/15/2021   Epigastric pain 08/15/2021   Acute pain of both shoulders 04/21/2021   Nerve pain 04/21/2021   Morbid obesity (HCC) 10/13/2020   Essential hypertension 03/12/2019   Chronic tachycardia 03/12/2019   Generalized anxiety disorder with panic attacks 03/12/2019   Family history of heart attack 03/12/2019   LVH (left ventricular hypertrophy) due to hypertensive disease, without heart failure 03/12/2019   Type 2 diabetes mellitus (HCC) 03/12/2019    PCP: Rheba Cedar, NP  REFERRING PROVIDER: Cameron Cea, MD  REFERRING DIAG:  Diagnosis  C50.812,Z17.0 (ICD-10-CM) -  Malignant neoplasm of overlapping sites of left breast in female, estrogen receptor positive (HCC)    THERAPY DIAG:  Aftercare following surgery for neoplasm  Malignant neoplasm of upper-outer quadrant of left breast in female, estrogen receptor positive (HCC)  Lymphedema, not elsewhere classified  Chronic left shoulder pain  ONSET DATE: 05/2022  Rationale for Evaluation and Treatment: Rehabilitation  SUBJECTIVE:                                                                                                                                                                                           SUBJECTIVE STATEMENT: My breast is some better but the inside part just really gets firm still. The band exercises have helped decrease my Lt axilla tightness some. Everything still just feels worse at the end of the day still.   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 She has a BMI > 50, hypertension, and diabetes. Left rotator cuff tear; needs surgery but is now postponed. Pt had neoadjuvant chemotherapy and she is s/p a Left lumpectomy with ALND and 0+/14 LN's. Completed radiation 03/25/23. Noted breast pain at [facility].   PAIN:  Are you having pain? PAIN:  Are you having pain? No, not currently  PRECAUTIONS: Lt UE lymphedema risk   RED FLAGS: None   WEIGHT BEARING RESTRICTIONS: No  FALLS:  Has patient fallen in last 6 months? No  LIVING ENVIRONMENT: Lives with: lives with their family and lives with their spouse  OCCUPATION: [employer], desk work     LEISURE: nothing now   HAND DOMINANCE: right   PRIOR LEVEL OF FUNCTION: Independent  PATIENT GOALS: decrease the pain   OBJECTIVE: Note: Objective measures were completed at Evaluation unless otherwise noted.  COGNITION: Overall cognitive status: Within functional limits for tasks  assessed   PALPATION: Fibrosis  and +2 ttp medial breast, fibrosis lateral breast  with +1 ttp   OBSERVATIONS / OTHER ASSESSMENTS: enlarged pores and peau de orange   POSTURE: rounded shoulders   UPPER EXTREMITY AROM/PROM: [facility]  BREAST COMPLAINTS QUESTIONNAIRE    07/05/23  07/25/23  08/22/23 Pain:   8  4  5  Heaviness:  6  7  4  Swollen feeling: 8  5  5  Tense Skin:  8  3  5  Redness:  8  0  1 Bra Print:  10  5  5  Size of Pores:  10  6  4  Hard feeling:   10  6  6  Total:      68  /80  36/80  35/80 A Score over 9 indicates lymphedema issues in the breast                                                                                                                             TREATMENT DATE:  With pt permission for breast MLD 08/22/23: Self Care Spent time at beginning of session reviewing pts goals assessing, her current functional status and had pt re take breast complaints questionnaire and answered her questions.  Manual Therapy MLD to Lt breast as follows: Short neck, superficial abdominals and 5 diaphragmatic breaths, Lt inguinal nodes and Lt axillo-inguinal anastomosis, Rt axillary and pectoral nodes, anterior inter-axillary anastomosis, then focused on medial and superior breast, next into Rt S/L for focus to lateral breast redirecting towards lateral anastomosis and focusing on area of fibrosis palpated at lateral breast, then finished retracing all steps in supine.  P/ROM to Lt shoulder during MLD into flex, abd and D2 with scapular depression throughout, end motion much improved today MFR to axillary tightness STM to Lt axilla and axillary incision where pt was very tender to touch here today  08/01/23: Therapeutic Exercises Pulleys into flex and abd x 2 mins each with VC's to decrease scapular compensations Roll yellow ball up wall into flex and Lt abd x 10 each Therapeutic Activities Supine scapular series with yellow theraband x 10 each returning therapist demo. Handout  issued Manual Therapy MLD to Lt breast as follows: Short neck, superficial abdominals and 5 diaphragmatic breaths, Lt inguinal nodes and Lt axillo-inguinal anastomosis, Rt axillary and pectoral nodes, anterior inter-axillary anastomosis, then focused on medial and superior breast, next into Rt S/L for focus to lateral breast redirecting towards lateral anastomosis and focusing on area of fibrosis palpated at lateral breast, then finished retracing all steps in supine.  P/ROM to Lt shoulder during MLD into flex, abd and D2 with scapular depression throughout, end motion much improved today STM to Lt axilla and lateral trunk, axilla still very tight but lateral trunk much improved; then shen in S/L STM to medial scap border and trigger points at UT  07/30/23: Self Care Educated pt on swell spots and that since she is noting fluid softening with the chip packs that she would then benefit from  a swell spot. Pt is interested in pursuing this so set an account up for her on [employer] so that she is able to order at her convenience.  Manual Therapy MLD to Lt breast as follows: Short neck, superficial and deep abdominals, Lt inguinal nodes and Lt axillo-inguinal anastomosis, Rt axillary and pectoral nodes, anterior intact thorax sequence, anterior inter-axillary anastomosis, then focused on medial and superior breast, next into Rt S/L for focus to lateral breast redirecting towards lateral anastomosis and focusing on area of fibrosis palpated at lateral breast, then finished retracing all steps in supine.  P/ROM to Lt shoulder during MLD into flex, abd and D2 with scapular depression throughout, end motion much improved today STM to Lt axilla and lateral trunk, axilla still very tight but lateral trunk much improved Therapeutic Exercise Roll yellow ball up wall into flex and abd x 10 each focusing on end ROM stretch Modified downward dog on wall x 5 reps, 5 sec holds returning therapist demo      PATIENT  EDUCATION:  Education details: Supine scapular series with yellow theraband Person educated: Patient Education method: Explanation, Demonstration, Tactile cues, Verbal cues, and Handouts Education comprehension: verbalized understanding, returned demonstration, and needs further education  HOME EXERCISE PROGRAM: Self MLD, compression  ASSESSMENT:  CLINICAL IMPRESSION: Pt returns after a few weeks off due to being out of town and appt conflicts. Her breast complaints questionnaire has improved since eval from 68 to 35/80 showing good progress but also shows need for continued therapy at this time. Her biggest c/o now is her end of the day shooting breast pain and the axillary tightness. Pt will benefit from continued physical therapy at this time to help pt further decrease her end of the day c/o Lt axillary tightness and breast pain allowing for increased ease with her work tasks and ADLs which includes reaching to high shelves.    OBJECTIVE IMPAIRMENTS: decreased knowledge of condition, decreased knowledge of use of DME, increased edema, and increased fascial restrictions.   ACTIVITY LIMITATIONS: sleeping  PARTICIPATION LIMITATIONS: community activity  PERSONAL FACTORS: 1-2 comorbidities: ALND, radiation are also affecting patient's functional outcome.   REHAB POTENTIAL: Excellent  CLINICAL DECISION MAKING: Evolving/moderate complexity  EVALUATION COMPLEXITY: Low  GOALS: Goals reviewed with patient? Yes  SHORT TERM GOALS: Target date: 08/09/23  Pt will be ind with MLD for the Left breast Baseline: 07/25/23 - Pt is independent with this Goal status: MET  2.  Pt will decrease resting breast pain to 1/10 or less Baseline: 5/10; 07/25/23 - 0/10 at times, but not consistently, end of work day some shooting pains with increased swelling noted; 08/22/23 - pt does report periods of no pain earlier on the day but shooting breast pain always increases by end of day up to 4-7/10 Goal status:  PARTIALLY MET and ONGOING  3.  Pt will decrease breast edema complaints questionnaire to 45 or less to demonstrate improvements in the breast pain and composition.   Baseline: 68; 07/25/23 - 36; 08/22/23 - 35 Goal status: ONGOING   PLAN:  PT FREQUENCY: 2x/week  PT DURATION: 4 weeks  PLANNED INTERVENTIONS: 97110-Therapeutic exercises, 97530- Therapeutic activity, 97535- Self Care, 02725- Manual therapy, Patient/Family education, Therapeutic exercises, Therapeutic activity, Neuromuscular re-education, Gait training, and Self Care  PLAN FOR NEXT SESSION: MD renewal done today. Cont left breast MLD and Lt shoulder ROM stretches and STM to Lt shoulder; review supine scapular series next with theraband.  Denyce Flank, PTA 08/22/2023, 12:57 PM   Over  Head Pull: Narrow and Wide Grip   Cancer Rehab [phone]   On back, knees bent, feet flat, band across thighs, elbows straight but relaxed. Pull hands apart (start). Keeping elbows straight, bring arms up and over head, hands toward floor. Keep pull steady on band. Hold momentarily. Return slowly, keeping pull steady, back to start. Then do same with a wider grip on the band (past shoulder width) Repeat _5-10__ times. Band color __yellow____   Side Pull: Double Arm   On back, knees bent, feet flat. Arms perpendicular to body, shoulder level, elbows straight but relaxed. Pull arms out to sides, elbows straight. Resistance band comes across collarbones, hands toward floor. Hold momentarily. Slowly return to starting position. Repeat _5-10__ times. Band color _yellow____   Sword   On back, knees bent, feet flat, left hand on left hip, right hand above left. Pull right arm DIAGONALLY (hip to shoulder) across chest. Bring right arm along head toward floor. Hold momentarily. Slowly return to starting position. Repeat _5-10__ times. Do with left arm. Band color _yellow_____   Shoulder Rotation: Double Arm   On back, knees bent, feet  flat, elbows tucked at sides, bent 90, hands palms up. Pull hands apart and down toward floor, keeping elbows near sides. Hold momentarily. Slowly return to starting position. Repeat _5-10__ times. Band color __yellow____

## 2023-08-28 ENCOUNTER — Other Ambulatory Visit: Payer: Self-pay | Admitting: Pharmacist

## 2023-08-28 DIAGNOSIS — C50812 Malignant neoplasm of overlapping sites of left female breast: Secondary | ICD-10-CM

## 2023-08-28 NOTE — Progress Notes (Unsigned)
 [facility]       Telephone: [phone]?Fax: [phone]   Oncology Clinical Pharmacist Practitioner Progress Note  Lawrie Tunks was contacted via in-person to discuss her chemotherapy regimen for abemaciclib  which they receive under the care of Dr. Vinay Gudena.  Current treatment regimen and start date Abemaciclib  (04/10/23) 150 mg BID (08/24/23) 100 mg BID (04/10/23) Letrozole  (06/06/23) Anastrozole  (04/09/23) -- discontinued due to headaches Goserelin (04/10/23)  Interval History She continues on abemaciclib  150 mg by mouth every 12 hours on days 1 to 28 of a 28-day cycle. This is being given in combination with letrozole  and goserelin. Therapy is planned to continue until two years in the adjuvant setting per the [employer] trial data. She was last seen by Dr. Lee Public on 08/12/23 and clinical pharmacy on 08/01/23. She switched to letrozole  on 06/06/23 due to headaches while on anastrozole . She started the higher dose of abemaciclib  on 08/24/23.  Response to Therapy She is doing well. No side effect reported today and labs are stable to slightly improved. She will see Dr. Gudena again in 4 weeks. She is due for goserelin today which she receives every 4 weeks.  Labs, vitals, treatment parameters, and manufacturer guidelines assessing toxicity were reviewed with Ms. Turano today. Based on these values, patient is in agreement to continue abemaciclib  therapy at this time.  Allergies Allergies  Allergen Reactions   Macrobid [Nitrofurantoin Macrocrystal] Shortness Of Breath and Nausea And Vomiting   Ibuprofen Hives   Vitals    08/29/2023    1:14 PM 08/20/2023    9:11 AM 08/12/2023    3:38 PM  Oncology Vitals  Height   170 cm  Weight 155.493 kg 157.001 kg 156.945 kg  Weight (lbs) 342 lbs 13 oz 346 lbs 2 oz 346 lbs  BMI 53.69 kg/m2 54.21 kg/m2 54.19 kg/m2  Temp 98.1 F (36.7 C)    Pulse Rate 110  113  BP 136/69  136/78  Resp 19    SpO2 95 %  95 %  BSA (m2) 2.71 m2 2.72 m2  2.72 m2   Laboratory Data    Latest Ref Rng & Units 08/29/2023   12:29 PM 08/01/2023    1:12 PM 06/06/2023   10:25 AM  CBC EXTENDED  WBC 4.0 - 10.5 K/uL 4.7  4.0  4.0   RBC 3.87 - 5.11 MIL/uL 3.74  3.67  3.58   Hemoglobin 12.0 - 15.0 g/dL 27.2  53.6  64.4   HCT 36.0 - 46.0 % 31.4  30.4  29.7   Platelets 150 - 400 K/uL 172  197  176   NEUT# 1.7 - 7.7 K/uL 3.2  2.5  2.9   Lymph# 0.7 - 4.0 K/uL 1.0  1.0  0.6        Latest Ref Rng & Units 08/29/2023   12:29 PM 08/01/2023    1:12 PM 06/06/2023   10:25 AM  CMP  Glucose 70 - 99 mg/dL 034  742  595   BUN 6 - 20 mg/dL 14  16  13    Creatinine 0.44 - 1.00 mg/dL 6.38  7.56  4.33   Sodium 135 - 145 mmol/L 134  133  136   Potassium 3.5 - 5.1 mmol/L 4.3  4.0  4.4   Chloride 98 - 111 mmol/L 98  100  103   CO2 22 - 32 mmol/L 30  28  29    Calcium 8.9 - 10.3 mg/dL 9.4  9.2  8.8   Total Protein  6.5 - 8.1 g/dL 7.2  7.3  6.9   Total Bilirubin 0.0 - 1.2 mg/dL 0.6  0.5  0.6   Alkaline Phos 38 - 126 U/L 71  69  56   AST 15 - 41 U/L 25  29  19    ALT 0 - 44 U/L 28  32  22     Adverse Effects Assessment Sodium: improved from last visit values. Will continue to monitor. May be from hydrochlorothiazide .  Adherence Assessment Jolinda Pinkstaff reports missing 0 doses over the past 4 weeks.   Reason for missed dose: N/A Patient was re-educated on importance of adherence.   Access Assessment Yashvi Jasinski is currently receiving her abemaciclib  through [facility] concerns:  none  Medication Reconciliation The patient's medication list was reviewed today with the patient? Yes New medications or herbal supplements have recently been started? No  Any medications have been discontinued? No  The medication list was updated and reconciled based on the patient's most recent medication list in the electronic medical record (EMR) including herbal products and OTC medications.   Medications Current Outpatient Medications  Medication  Sig Dispense Refill   abemaciclib  (VERZENIO ) 150 MG tablet Take 1 tablet (150 mg total) by mouth 2 (two) times daily. 56 tablet 3   ALPRAZolam  (XANAX ) 0.5 MG tablet Take 1 tablet (0.5 mg total) by mouth at bedtime as needed for anxiety. 30 tablet 3   aspirin EC 81 MG tablet Take 81 mg by mouth daily. Swallow whole.     aspirin-acetaminophen -caffeine (EXCEDRIN MIGRAINE) 250-250-65 MG tablet Take 1-2 tablets by mouth 2 (two) times daily as needed for headache or migraine.     Blood Glucose Monitoring Suppl (ACCU-CHEK AVIVA PLUS) w/Device KIT Use to check blood sugar once daily at 6 (six) AM. 1 kit 0   carvedilol  (COREG ) 25 MG tablet Take 1 tablet (25 mg total) by mouth 2 (two) times daily with a meal. 180 tablet 0   cyanocobalamin  (VITAMIN B12) 1000 MCG tablet Take 2,000 mcg by mouth daily.     escitalopram  (LEXAPRO ) 10 MG tablet Take 1 tablet (10 mg total) by mouth daily. 90 tablet 1   esomeprazole (NEXIUM) 20 MG capsule Take 20 mg by mouth daily at 12 noon.     gabapentin  (NEURONTIN ) 100 MG capsule Take 1 capsule (100 mg total) by mouth 3 (three) times daily. 90 capsule 3   glucose blood (ACCU-CHEK AVIVA PLUS) test strip Use as instructed (Patient taking differently: 1 each by Other route See admin instructions. Use as instructed) 100 each 12   glucose blood (ACCU-CHEK GUIDE TEST) test strip Use to check blood sugar once a day at 6 am. (Patient taking differently: 1 each by Other route See admin instructions. Use to check blood sugar once a day at 6 am.) 50 strip 0   goserelin (ZOLADEX ) 3.6 MG injection Inject 3.6 mg into the skin every 28 (twenty-eight) days.     Lancets (ONETOUCH DELICA PLUS LANCET33G) MISC Use to check blood sugar once a day at 6 am. (Patient taking differently: 1 each by Other route daily.) 100 each 0   letrozole  (FEMARA ) 2.5 MG tablet Take 1 tablet (2.5 mg total) by mouth daily. 90 tablet 3   lisinopril -hydrochlorothiazide  (ZESTORETIC ) 10-12.5 MG tablet Take 1 tablet by mouth  daily. 90 tablet 1   ONE TOUCH CLUB LANCETS MISC 1 each by Does not apply route daily. 100 each 11   prochlorperazine  (COMPAZINE ) 10 MG tablet Take 1  tablet (10 mg total) by mouth every 6 (six) hours as needed for nausea or vomiting. 30 tablet 2   Semaglutide , 2 MG/DOSE, 8 MG/3ML SOPN Inject 2 mg as directed once a week. 3 mL 2   Vitamin D , Ergocalciferol , (DRISDOL ) 1.25 MG (50000 UNIT) CAPS capsule Take 1 capsule (50,000 Units total) by mouth every 7 (seven) days. 12 capsule 0   No current facility-administered medications for this visit.   Drug-Drug Interactions (DDIs) DDIs were evaluated? Yes Significant DDIs? No , she had some duplicate records for carvedilol  which were corrected. She uses aspirin with no issues. We did discuss again the potential interaction of esomeprazole and escitalopram  The patient was instructed to speak with their health care provider and/or the oral chemotherapy pharmacist before starting any new drug, including prescription or over the counter, natural / herbal products, or vitamins.  Supportive Care Diarrhea: we reviewed that diarrhea is common with abemaciclib  and confirmed that she does have loperamide (Imodium) at home.  We reviewed how to take this medication PRN. Neutropenia: we discussed the importance of having a thermometer and what the Centers for Disease Control and Prevention (CDC) considers a fever which is 100.84F (38C) or higher.  Gave patient 24/7 triage line to call if any fevers or symptoms. ILD/Pneumonitis: we reviewed potential symptoms including cough, shortness, and fatigue.  VTE: reviewed signs of DVT such as leg swelling, redness, pain, or tenderness and signs of PE such as shortness of breath, rapid or irregular heartbeat, cough, chest pain, or lightheadedness. Reviewed to take the medication every 12 hours (with food sometimes can be easier on the stomach) and to take it at the same time every day. Hepatotoxicity:WNL Drug interactions with  grapefruit products  Dosing Assessment Hepatic adjustments needed? No  Renal adjustments needed? No  Toxicity adjustments needed? No  The current dosing regimen is appropriate to continue at this time.  Follow-Up Plan Continue abemaciclib  150 by mouth every 12 hours Continue letrozole  2.5 mg by mouth daily. Continue goserelin 3.6 mg SubQ every 28 days. Due today, then again in 4 weeks Monitor sodium and for toxicities Labs, Dr. Lee Public visit, goserelin scheduled for 09/26/23.  Shantil Vallejo can follow up with clinical pharmacy as deemed necessary by Dr. Cameron Cea going forward   Selestine Daisy participated in the discussion, expressed understanding, and voiced agreement with the above plan. All questions were answered to her satisfaction. The patient was advised to contact the clinic at [phone] with any questions or concerns prior to her return visit.   I spent 30 minutes assessing and educating the patient.  Adelard Sanon A. Webb Hake, PharmD, BCOP, CPP  Althea Atkinson, RPH-CPP, 08/29/2023  1:35 PM   **Disclaimer: This note was dictated with voice recognition software. Similar sounding words can inadvertently be transcribed and this note may contain transcription errors which may not have been corrected upon publication of note.**

## 2023-08-29 ENCOUNTER — Other Ambulatory Visit (HOSPITAL_COMMUNITY): Payer: Self-pay

## 2023-08-29 ENCOUNTER — Ambulatory Visit

## 2023-08-29 ENCOUNTER — Other Ambulatory Visit: Payer: Self-pay | Admitting: Hematology and Oncology

## 2023-08-29 ENCOUNTER — Inpatient Hospital Stay

## 2023-08-29 ENCOUNTER — Inpatient Hospital Stay: Payer: BC Managed Care – PPO

## 2023-08-29 ENCOUNTER — Inpatient Hospital Stay: Admitting: Pharmacist

## 2023-08-29 VITALS — BP 136/69 | HR 110 | Temp 98.1°F | Resp 19 | Wt 342.8 lb

## 2023-08-29 DIAGNOSIS — Z170 Estrogen receptor positive status [ER+]: Secondary | ICD-10-CM

## 2023-08-29 DIAGNOSIS — C50812 Malignant neoplasm of overlapping sites of left female breast: Secondary | ICD-10-CM

## 2023-08-29 DIAGNOSIS — Z483 Aftercare following surgery for neoplasm: Secondary | ICD-10-CM

## 2023-08-29 DIAGNOSIS — I890 Lymphedema, not elsewhere classified: Secondary | ICD-10-CM | POA: Diagnosis not present

## 2023-08-29 DIAGNOSIS — Z5111 Encounter for antineoplastic chemotherapy: Secondary | ICD-10-CM | POA: Diagnosis not present

## 2023-08-29 DIAGNOSIS — G8929 Other chronic pain: Secondary | ICD-10-CM | POA: Diagnosis not present

## 2023-08-29 DIAGNOSIS — M25512 Pain in left shoulder: Secondary | ICD-10-CM | POA: Diagnosis not present

## 2023-08-29 DIAGNOSIS — C50412 Malignant neoplasm of upper-outer quadrant of left female breast: Secondary | ICD-10-CM

## 2023-08-29 LAB — CMP (CANCER CENTER ONLY)
ALT: 28 U/L (ref 0–44)
AST: 25 U/L (ref 15–41)
Albumin: 4 g/dL (ref 3.5–5.0)
Alkaline Phosphatase: 71 U/L (ref 38–126)
Anion gap: 6 (ref 5–15)
BUN: 14 mg/dL (ref 6–20)
CO2: 30 mmol/L (ref 22–32)
Calcium: 9.4 mg/dL (ref 8.9–10.3)
Chloride: 98 mmol/L (ref 98–111)
Creatinine: 0.95 mg/dL (ref 0.44–1.00)
GFR, Estimated: >60 mL/min (ref >60)
Glucose, Bld: 194 mg/dL — ABNORMAL HIGH (ref 70–99)
Potassium: 4.3 mmol/L (ref 3.5–5.1)
Sodium: 134 mmol/L — ABNORMAL LOW (ref 135–145)
Total Bilirubin: 0.6 mg/dL (ref 0.0–1.2)
Total Protein: 7.2 g/dL (ref 6.5–8.1)

## 2023-08-29 LAB — CBC WITH DIFFERENTIAL (CANCER CENTER ONLY)
Abs Immature Granulocytes: 0.02 10*3/uL (ref 0.00–0.07)
Basophils Absolute: 0.1 10*3/uL (ref 0.0–0.1)
Basophils Relative: 1 %
Eosinophils Absolute: 0.1 10*3/uL (ref 0.0–0.5)
Eosinophils Relative: 1 %
HCT: 31.4 % — ABNORMAL LOW (ref 36.0–46.0)
Hemoglobin: 11.1 g/dL — ABNORMAL LOW (ref 12.0–15.0)
Immature Granulocytes: 0 %
Lymphocytes Relative: 20 %
Lymphs Abs: 1 10*3/uL (ref 0.7–4.0)
MCH: 29.7 pg (ref 26.0–34.0)
MCHC: 35.4 g/dL (ref 30.0–36.0)
MCV: 84 fL (ref 80.0–100.0)
Monocytes Absolute: 0.4 10*3/uL (ref 0.1–1.0)
Monocytes Relative: 9 %
Neutro Abs: 3.2 10*3/uL (ref 1.7–7.7)
Neutrophils Relative %: 69 %
Platelet Count: 172 10*3/uL (ref 150–400)
RBC: 3.74 MIL/uL — ABNORMAL LOW (ref 3.87–5.11)
RDW: 14.3 % (ref 11.5–15.5)
WBC Count: 4.7 10*3/uL (ref 4.0–10.5)
nRBC: 0 % (ref 0.0–0.2)

## 2023-08-29 MED ORDER — GOSERELIN ACETATE 3.6 MG ~~LOC~~ IMPL
3.6000 mg | DRUG_IMPLANT | Freq: Once | SUBCUTANEOUS | Status: AC
  Administered 2023-08-29: 3.6 mg via SUBCUTANEOUS
  Filled 2023-08-29: qty 3.6

## 2023-08-29 MED ORDER — ALPRAZOLAM 0.5 MG PO TABS
0.5000 mg | ORAL_TABLET | Freq: Every evening | ORAL | 3 refills | Status: DC | PRN
Start: 2023-08-29 — End: 2024-01-22
  Filled 2023-08-29: qty 30, 30d supply, fill #0
  Filled 2023-10-07 – 2023-10-16 (×2): qty 30, 30d supply, fill #1
  Filled 2023-11-17: qty 30, 30d supply, fill #2
  Filled 2023-12-22: qty 30, 30d supply, fill #3

## 2023-08-29 NOTE — Therapy (Signed)
 OUTPATIENT PHYSICAL THERAPY  UPPER EXTREMITY ONCOLOGY TREATMENT  Patient Name: Lynn [name] MRN: [id number] DOB:Dec 27, 1985, 38 y.o., female Today's Date: 08/29/2023  END OF SESSION:  PT End of Session - 08/29/23 0810     Visit Number 10    Number of Visits 17    Date for PT Re-Evaluation 09/19/23    Authorization Type 6 visits 07/05/23-09/02/23; 5 visits 07/26/23-09/23/23    Authorization - Visit Number 4    Authorization - Number of Visits 5    PT Start Time 0809    PT Stop Time 0906    PT Time Calculation (min) 57 min    Activity Tolerance Patient tolerated treatment well    Behavior During Therapy [facility] for tasks assessed/performed              Past Medical History:  Diagnosis Date   Anemia    Cancer (HCC) 2024   L breast   Carpal tunnel syndrome, left    Depression    Diabetes (HCC)    Dysrhythmia    Palpitations   GERD (gastroesophageal reflux disease)    Hypertension    Iron deficiency    Panic attacks    PCOS (polycystic ovarian syndrome)    Personal history of chemotherapy    Pneumonia    Port-A-Cath in place 07/31/2022   Tachycardia    Umbilical hernia    Past Surgical History:  Procedure Laterality Date   ADENOIDECTOMY     As a child   AXILLARY LYMPH NODE DISSECTION Left 11/20/2022   Procedure: LEFT AXILLARY DISSECTION;  Surgeon: Enid Harry, MD;  Location: [facility] OR;  Service: General;  Laterality: Left;   BREAST BIOPSY Left 05/29/2022   US  LT BREAST BX W LOC DEV 1ST LESION IMG BX SPEC US  GUIDE 05/29/2022 [facility]   BREAST BIOPSY Left 05/29/2022   US  LT BREAST BX W LOC DEV EA ADD LESION IMG BX SPEC US  GUIDE 05/29/2022 [facility]   BREAST BIOPSY Left 06/06/2022   MM LT BREAST BX W LOC DEV 1ST LESION IMAGE BX SPEC STEREO GUIDE 06/06/2022 [facility]   BREAST BIOPSY  11/19/2022   MM LT RADIOACTIVE SEED LOC MAMMO GUIDE 11/19/2022 [facility]   BREAST BIOPSY  11/19/2022   MM LT RADIOACTIVE SEED EA ADD LESION LOC MAMMO GUIDE  11/19/2022 [facility]   BREAST BIOPSY  11/19/2022   MM LT RADIOACTIVE SEED EA ADD LESION LOC MAMMO GUIDE 11/19/2022 [facility]   BREAST LUMPECTOMY WITH RADIOACTIVE SEED AND AXILLARY LYMPH NODE DISSECTION Left 11/20/2022   Procedure: LEFT BREAST LUMPECTOMY WITH RADIOACTIVE SEED;  Surgeon: Enid Harry, MD;  Location: [facility] OR;  Service: General;  Laterality: Left;  PEC BLOCK   CESAREAN SECTION  2015   PORT-A-CATH REMOVAL Right 11/20/2022   Procedure: REMOVAL PORT-A-CATH;  Surgeon: Enid Harry, MD;  Location: [facility] OR;  Service: General;  Laterality: Right;   PORTACATH PLACEMENT N/A 06/19/2022   Procedure: INSERTION PORT-A-CATH;  Surgeon: Enid Harry, MD;  Location: [facility] OR;  Service: General;  Laterality: N/A;   TONSILLECTOMY     As a child   Patient Active Problem List   Diagnosis Date Noted   Anemia 07/11/2023   Breast cancer (HCC) 11/20/2022   Genetic testing 06/15/2022   Anxiety and depression 06/11/2022   Malignant neoplasm of overlapping sites of left breast in female, estrogen receptor positive (HCC) 06/04/2022   Syncope and collapse 03/12/2022   Umbilical hernia without obstruction and without gangrene 03/12/2022   Vitamin D  deficiency 03/12/2022  Cervical radicular pain 12/07/2021   Snoring 12/07/2021   Other fatigue 12/07/2021   Atypical chest pain 08/15/2021   Epigastric pain 08/15/2021   Acute pain of both shoulders 04/21/2021   Nerve pain 04/21/2021   Morbid obesity (HCC) 10/13/2020   Essential hypertension 03/12/2019   Chronic tachycardia 03/12/2019   Generalized anxiety disorder with panic attacks 03/12/2019   Family history of heart attack 03/12/2019   LVH (left ventricular hypertrophy) due to hypertensive disease, without heart failure 03/12/2019   Type 2 diabetes mellitus (HCC) 03/12/2019    PCP: Rheba Cedar, NP  REFERRING PROVIDER: Cameron Cea, MD  REFERRING DIAG:  Diagnosis  C50.812,Z17.0 (ICD-10-CM) - Malignant neoplasm of  overlapping sites of left breast in female, estrogen receptor positive (HCC)    THERAPY DIAG:  Aftercare following surgery for neoplasm  Malignant neoplasm of upper-outer quadrant of left breast in female, estrogen receptor positive (HCC)  Lymphedema, not elsewhere classified  Chronic left shoulder pain  ONSET DATE: 05/2022  Rationale for Evaluation and Treatment: Rehabilitation  SUBJECTIVE:                                                                                                                                                                                           SUBJECTIVE STATEMENT: I haven't been as consistent with my self MLD since vacation and I've been planning my sisters memorial service for this summer.   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 She has a BMI > 50, hypertension, and diabetes. Left rotator cuff tear; needs surgery but is now postponed. Pt had neoadjuvant chemotherapy and she is s/p a Left lumpectomy with ALND and 0+/14 LN's. Completed radiation 03/25/23. Noted breast pain at [facility].   PAIN:  Are you having pain? PAIN:  Are you having pain? No, not currently  PRECAUTIONS: Lt UE lymphedema risk   RED FLAGS: None   WEIGHT BEARING RESTRICTIONS: No  FALLS:  Has patient fallen in last 6 months? No  LIVING ENVIRONMENT: Lives with: lives with their family and lives with their spouse  OCCUPATION: [employer], desk work     LEISURE: nothing now   HAND DOMINANCE: right   PRIOR LEVEL OF FUNCTION: Independent  PATIENT GOALS: decrease the pain   OBJECTIVE: Note: Objective measures were completed at Evaluation unless otherwise noted.  COGNITION: Overall cognitive status: Within functional limits for tasks assessed   PALPATION: Fibrosis and +2 ttp medial breast, fibrosis lateral breast  with +1 ttp    OBSERVATIONS / OTHER ASSESSMENTS: enlarged pores and  peau de orange   POSTURE: rounded shoulders   UPPER EXTREMITY AROM/PROM: [facility]  BREAST COMPLAINTS QUESTIONNAIRE    07/05/23  07/25/23  08/22/23 Pain:   8  4  5  Heaviness:  6  7  4  Swollen feeling: 8  5  5  Tense Skin:  8  3  5  Redness:  8  0  1 Bra Print:  10  5  5  Size of Pores:  10  6  4  Hard feeling:   10  6  6  Total:      68  /80  36/80  35/80 A Score over 9 indicates lymphedema issues in the breast                                                                                                                             TREATMENT DATE:  With pt permission for breast MLD 08/29/23: Manual Therapy MLD to Lt breast as follows: Short neck, superficial abdominals and 5 diaphragmatic breaths, Lt inguinal nodes and Lt axillo-inguinal anastomosis, Rt axillary and pectoral nodes, anterior inter-axillary anastomosis, then focused on medial and superior breast, next into Rt S/L for focus to lateral breast redirecting towards lateral anastomosis and focusing on area of fibrosis palpated at lateral breast, then finished retracing all steps in supine.  P/ROM to Lt shoulder during MLD into flex, abd and D2 with scapular depression throughout, end motion much improved today MFR to axillary tightness STM to Lt axilla and axillary incision where pt was very tender to touch here today Therapeutic Activities Reviewed supine scap series with yellow theraband x 10 each, min VC's to remind pt of correct technique  08/22/23: Self Care Spent time at beginning of session reviewing pts goals assessing, her current functional status and had pt re take breast complaints questionnaire and answered her questions.  Manual Therapy MLD to Lt breast as follows: Short neck, superficial abdominals and 5 diaphragmatic breaths, Lt inguinal nodes and Lt axillo-inguinal anastomosis, Rt axillary and pectoral nodes, anterior inter-axillary anastomosis, then focused on  medial and superior breast, next into Rt S/L for focus to lateral breast redirecting towards lateral anastomosis and focusing on area of fibrosis palpated at lateral breast, then finished retracing all steps in supine.  P/ROM to Lt shoulder during MLD into flex, abd and D2 with scapular depression throughout, end motion much improved today MFR to axillary tightness STM to Lt axilla and axillary incision where pt was very tender to touch here today  08/01/23: Therapeutic Exercises Pulleys into flex and abd x 2 mins each with VC's to decrease scapular compensations Roll yellow ball up wall into flex and Lt abd x 10 each Therapeutic Activities Supine scapular series with yellow theraband x 10 each returning therapist demo. Handout issued Manual Therapy MLD to Lt breast as follows: Short neck, superficial abdominals and 5 diaphragmatic breaths, Lt inguinal nodes and Lt axillo-inguinal anastomosis, Rt axillary and pectoral nodes, anterior inter-axillary anastomosis, then focused on medial and  superior breast, next into Rt S/L for focus to lateral breast redirecting towards lateral anastomosis and focusing on area of fibrosis palpated at lateral breast, then finished retracing all steps in supine.  P/ROM to Lt shoulder during MLD into flex, abd and D2 with scapular depression throughout, end motion much improved today STM to Lt axilla and lateral trunk, axilla still very tight but lateral trunk much improved; then shen in S/L STM to medial scap border and trigger points at UT       PATIENT EDUCATION:  Education details: Supine scapular series with yellow theraband Person educated: Patient Education method: Explanation, Demonstration, Tactile cues, Verbal cues, and Handouts Education comprehension: verbalized understanding, returned demonstration, and needs further education  HOME EXERCISE PROGRAM: Self MLD, compression  ASSESSMENT:  CLINICAL IMPRESSION: Continued with MLD to Lt breast as pt  has increased fibrosis at medial breast. Also continued with STM to Lt axilla and scar tissue. Then reviewed supine scapular series.    OBJECTIVE IMPAIRMENTS: decreased knowledge of condition, decreased knowledge of use of DME, increased edema, and increased fascial restrictions.   ACTIVITY LIMITATIONS: sleeping  PARTICIPATION LIMITATIONS: community activity  PERSONAL FACTORS: 1-2 comorbidities: ALND, radiation are also affecting patient's functional outcome.   REHAB POTENTIAL: Excellent  CLINICAL DECISION MAKING: Evolving/moderate complexity  EVALUATION COMPLEXITY: Low  GOALS: Goals reviewed with patient? Yes  SHORT TERM GOALS: Target date: 08/09/23  Pt will be ind with MLD for the Left breast Baseline: 07/25/23 - Pt is independent with this Goal status: MET  2.  Pt will decrease resting breast pain to 1/10 or less Baseline: 5/10; 07/25/23 - 0/10 at times, but not consistently, end of work day some shooting pains with increased swelling noted; 08/22/23 - pt does report periods of no pain earlier on the day but shooting breast pain always increases by end of day up to 4-7/10 Goal status: PARTIALLY MET and ONGOING  3.  Pt will decrease breast edema complaints questionnaire to 45 or less to demonstrate improvements in the breast pain and composition.   Baseline: 68; 07/25/23 - 36; 08/22/23 - 35 Goal status: ONGOING   PLAN:  PT FREQUENCY: 2x/week  PT DURATION: 4 weeks  PLANNED INTERVENTIONS: 97110-Therapeutic exercises, 97530- Therapeutic activity, 97535- Self Care, 01027- Manual therapy, Patient/Family education, Therapeutic exercises, Therapeutic activity, Neuromuscular re-education, Gait training, and Self Care  PLAN FOR NEXT SESSION: Cont left breast MLD and Lt shoulder ROM stretches and STM to Lt shoulder  Denyce Flank, PTA 08/29/2023, 9:08 AM   Over Head Pull: Narrow and Wide Grip   Cancer Rehab [phone]   On back, knees bent, feet flat, band across thighs,  elbows straight but relaxed. Pull hands apart (start). Keeping elbows straight, bring arms up and over head, hands toward floor. Keep pull steady on band. Hold momentarily. Return slowly, keeping pull steady, back to start. Then do same with a wider grip on the band (past shoulder width) Repeat _5-10__ times. Band color __yellow____   Side Pull: Double Arm   On back, knees bent, feet flat. Arms perpendicular to body, shoulder level, elbows straight but relaxed. Pull arms out to sides, elbows straight. Resistance band comes across collarbones, hands toward floor. Hold momentarily. Slowly return to starting position. Repeat _5-10__ times. Band color _yellow____   Sword   On back, knees bent, feet flat, left hand on left hip, right hand above left. Pull right arm DIAGONALLY (hip to shoulder) across chest. Bring right arm along head toward floor. Hold momentarily. Slowly return  to starting position. Repeat _5-10__ times. Do with left arm. Band color _yellow_____   Shoulder Rotation: Double Arm   On back, knees bent, feet flat, elbows tucked at sides, bent 90, hands palms up. Pull hands apart and down toward floor, keeping elbows near sides. Hold momentarily. Slowly return to starting position. Repeat _5-10__ times. Band color __yellow____

## 2023-09-03 ENCOUNTER — Other Ambulatory Visit: Payer: Self-pay | Admitting: Cardiology

## 2023-09-03 DIAGNOSIS — E119 Type 2 diabetes mellitus without complications: Secondary | ICD-10-CM

## 2023-09-03 DIAGNOSIS — C50919 Malignant neoplasm of unspecified site of unspecified female breast: Secondary | ICD-10-CM

## 2023-09-03 DIAGNOSIS — I10 Essential (primary) hypertension: Secondary | ICD-10-CM

## 2023-09-03 DIAGNOSIS — R0789 Other chest pain: Secondary | ICD-10-CM

## 2023-09-03 DIAGNOSIS — Z170 Estrogen receptor positive status [ER+]: Secondary | ICD-10-CM

## 2023-09-05 ENCOUNTER — Ambulatory Visit

## 2023-09-05 ENCOUNTER — Other Ambulatory Visit: Payer: Self-pay

## 2023-09-09 ENCOUNTER — Other Ambulatory Visit: Payer: Self-pay

## 2023-09-09 ENCOUNTER — Other Ambulatory Visit: Payer: Self-pay | Admitting: Pharmacy Technician

## 2023-09-09 NOTE — Progress Notes (Signed)
 Specialty Pharmacy Refill Coordination Note  Lynn [name] is a 38 y.o. female contacted today regarding refills of specialty medication(s) Abemaciclib  (VERZENIO )   Patient requested [name] at [facility] at [place] date: 09/09/23   Medication will be filled on 09/09/23.

## 2023-09-13 ENCOUNTER — Other Ambulatory Visit: Payer: Self-pay

## 2023-09-13 ENCOUNTER — Ambulatory Visit (HOSPITAL_BASED_OUTPATIENT_CLINIC_OR_DEPARTMENT_OTHER)
Admission: RE | Admit: 2023-09-13 | Discharge: 2023-09-13 | Disposition: A | Discharge status: Home / Self Care | Payer: Self-pay | Source: Ambulatory Visit | Attending: Cardiology | Admitting: Cardiology

## 2023-09-13 ENCOUNTER — Encounter (INDEPENDENT_AMBULATORY_CARE_PROVIDER_SITE_OTHER): Payer: Self-pay

## 2023-09-13 DIAGNOSIS — I10 Essential (primary) hypertension: Secondary | ICD-10-CM | POA: Insufficient documentation

## 2023-09-13 DIAGNOSIS — E119 Type 2 diabetes mellitus without complications: Secondary | ICD-10-CM | POA: Insufficient documentation

## 2023-09-13 NOTE — Progress Notes (Signed)
 Specialty Pharmacy Refill Coordination Note  Lynn [name] is a 38 y.o. female contacted today regarding refills of specialty medication(s) Abemaciclib  (VERZENIO )   Patient requested Delivery   Delivery date: 09/23/23   Verified address: [address] [place], [place] [address]   Medication will be filled on 09/20/23.   Deferred because insurance will not cover until 6/20, based on questionairre patient will run out on or around 6/23

## 2023-09-16 ENCOUNTER — Ambulatory Visit: Payer: Self-pay | Admitting: Cardiology

## 2023-09-16 ENCOUNTER — Ambulatory Visit (HOSPITAL_BASED_OUTPATIENT_CLINIC_OR_DEPARTMENT_OTHER): Attending: Cardiology

## 2023-09-20 ENCOUNTER — Other Ambulatory Visit: Payer: Self-pay

## 2023-09-26 ENCOUNTER — Inpatient Hospital Stay (HOSPITAL_BASED_OUTPATIENT_CLINIC_OR_DEPARTMENT_OTHER): Admitting: Hematology and Oncology

## 2023-09-26 ENCOUNTER — Inpatient Hospital Stay

## 2023-09-26 ENCOUNTER — Inpatient Hospital Stay: Payer: BC Managed Care – PPO | Attending: Hematology and Oncology

## 2023-09-26 VITALS — BP 147/78 | HR 109 | Temp 97.7°F | Resp 18 | Ht 67.0 in | Wt 340.6 lb

## 2023-09-26 DIAGNOSIS — Z170 Estrogen receptor positive status [ER+]: Secondary | ICD-10-CM

## 2023-09-26 DIAGNOSIS — C50812 Malignant neoplasm of overlapping sites of left female breast: Secondary | ICD-10-CM | POA: Diagnosis not present

## 2023-09-26 DIAGNOSIS — Z5111 Encounter for antineoplastic chemotherapy: Secondary | ICD-10-CM | POA: Diagnosis not present

## 2023-09-26 DIAGNOSIS — Z1721 Progesterone receptor positive status: Secondary | ICD-10-CM | POA: Diagnosis not present

## 2023-09-26 DIAGNOSIS — Z79899 Other long term (current) drug therapy: Secondary | ICD-10-CM | POA: Insufficient documentation

## 2023-09-26 DIAGNOSIS — Z1732 Human epidermal growth factor receptor 2 negative status: Secondary | ICD-10-CM | POA: Insufficient documentation

## 2023-09-26 LAB — CMP (CANCER CENTER ONLY)
ALT: 27 U/L (ref 0–44)
AST: 23 U/L (ref 15–41)
Albumin: 3.9 g/dL (ref 3.5–5.0)
Alkaline Phosphatase: 58 U/L (ref 38–126)
Anion gap: 7 (ref 5–15)
BUN: 17 mg/dL (ref 6–20)
CO2: 27 mmol/L (ref 22–32)
Calcium: 9.2 mg/dL (ref 8.9–10.3)
Chloride: 102 mmol/L (ref 98–111)
Creatinine: 0.89 mg/dL (ref 0.44–1.00)
GFR, Estimated: >60 mL/min (ref >60)
Glucose, Bld: 177 mg/dL — ABNORMAL HIGH (ref 70–99)
Potassium: 3.8 mmol/L (ref 3.5–5.1)
Sodium: 136 mmol/L (ref 135–145)
Total Bilirubin: 0.5 mg/dL (ref 0.0–1.2)
Total Protein: 6.9 g/dL (ref 6.5–8.1)

## 2023-09-26 LAB — CBC WITH DIFFERENTIAL (CANCER CENTER ONLY)
Abs Immature Granulocytes: 0 10*3/uL (ref 0.00–0.07)
Basophils Absolute: 0 10*3/uL (ref 0.0–0.1)
Basophils Relative: 1 %
Eosinophils Absolute: 0.1 10*3/uL (ref 0.0–0.5)
Eosinophils Relative: 2 %
HCT: 26.4 % — ABNORMAL LOW (ref 36.0–46.0)
Hemoglobin: 9.5 g/dL — ABNORMAL LOW (ref 12.0–15.0)
Immature Granulocytes: 0 %
Lymphocytes Relative: 25 %
Lymphs Abs: 0.8 10*3/uL (ref 0.7–4.0)
MCH: 30.5 pg (ref 26.0–34.0)
MCHC: 36 g/dL (ref 30.0–36.0)
MCV: 84.9 fL (ref 80.0–100.0)
Monocytes Absolute: 0.3 10*3/uL (ref 0.1–1.0)
Monocytes Relative: 8 %
Neutro Abs: 1.9 10*3/uL (ref 1.7–7.7)
Neutrophils Relative %: 64 %
Platelet Count: 155 10*3/uL (ref 150–400)
RBC: 3.11 MIL/uL — ABNORMAL LOW (ref 3.87–5.11)
RDW: 15.8 % — ABNORMAL HIGH (ref 11.5–15.5)
WBC Count: 3 10*3/uL — ABNORMAL LOW (ref 4.0–10.5)
nRBC: 0 % (ref 0.0–0.2)

## 2023-09-26 MED ORDER — GOSERELIN ACETATE 3.6 MG ~~LOC~~ IMPL
3.6000 mg | DRUG_IMPLANT | Freq: Once | SUBCUTANEOUS | Status: AC
  Administered 2023-09-26: 3.6 mg via SUBCUTANEOUS
  Filled 2023-09-26: qty 3.6

## 2023-09-26 NOTE — Assessment & Plan Note (Signed)
 stage IIIB ER/PR positive breast cancer diagnosed in 06/2022 currently receiving neoadjuvant chemotherapy.   06/27/2022: CT CAP: Left breast cancer, 2 left axillary lymph nodes, left adnexal cystic lesion 4 cm    Treatment Plan:  1. Neoadjuvant chemotherapy with Adriamycin  and Cytoxan  dose dense 4 followed by Taxol  weekly 10 completed 10/16/2022 2. 11/20/2022: Left lumpectomy: No residual cancer, margins negative, 0/14 lymph nodes 3. Followed by adjuvant radiation therapy 01/22/2023-03/21/2023 4.  Followed by antiestrogen therapy started 04/03/2023 ____________________________________________________ Antiestrogen therapy: ovarian function suppression with Zoladex  along with anastrozole  with Verzenio  (started 04/10/23)X 2 years, switched to letrozole  06/06/2023   Current dose of Verzenio : 150 p.o. twice daily (started 08/12/2023) Verzinio toxicities: Headaches: Switch to letrozole   intermittent diarrhea and constipation Fatigue Labs and follow-up in 2 months

## 2023-09-26 NOTE — Progress Notes (Signed)
 Patient Care Team: Nedra Tinnie LABOR, NP as PCP - General (Internal Medicine) Odean Potts, MD as Consulting Physician (Hematology and Oncology) Ebbie Cough, MD as Consulting Physician (General Surgery) Dewey Rush, MD as Consulting Physician (Radiation Oncology)  DIAGNOSIS:  Encounter Diagnosis  Name Primary?   Malignant neoplasm of overlapping sites of left breast in female, estrogen receptor positive (HCC) Yes    SUMMARY OF ONCOLOGIC HISTORY: Oncology History  Malignant neoplasm of overlapping sites of left breast in female, estrogen receptor positive (HCC)  05/29/2022 Initial Diagnosis   Palpable abnormalities in the left breast led to mammograms which revealed 3 masses 0.7 cm at 1 o'clock position, 3 cm at 2 o'clock position, additional nodule 8 mm total span 5 cm, 5 abnormal axillary lymph nodes biopsy of the masses and the lymph node were positive for grade 3 IDC with high-grade DCIS with LVI, ER 95%, PR 40%, Ki-67 60%, HER2 2+ by IHC FISH negative   06/15/2022 Cancer Staging   Staging form: Breast, AJCC 8th Edition - Clinical: Stage IIIB (cT3, cN3, cM0, G3, ER+, PR+, HER2-) - Signed by Crawford Morna Pickle, NP on 06/15/2022 Stage prefix: Initial diagnosis Histologic grading system: 3 grade system   06/15/2022 Breast MRI   IMPRESSION: 1. Multiple sites of biopsy proven malignancy with in the lateral left breast. The overall conglomerate of suspicious findings spans approximately 4.4 x 3.5 x 3.5 cm, and contains all 3 post biopsy marking clips. 2. 10+ morphologically abnormal level I, II, III and Rotter's lymph nodes within the left axilla. 3. Diffuse skin and trabecular thickening on the left, consistent with lymphovascular spread of disease. 4. No MRI evidence of malignancy on the right.   06/20/2022 - 10/09/2022 Chemotherapy   Patient is on Treatment Plan : BREAST ADJUVANT DOSE DENSE AC q14d / PACLitaxel  q7d      Genetic Testing   [facility] Multi-Cancer  Panel+RNA was Negative. Report date is 06/13/2022.  The Multi-Cancer + RNA Panel offered by [facility] includes sequencing and/or deletion/duplication analysis of the following 70 genes:  AIP*, ALK, APC*, ATM*, AXIN2*, BAP1*, BARD1*, BLM*, BMPR1A*, BRCA1*, BRCA2*, BRIP1*, CDC73*, CDH1*, CDK4, CDKN1B*, CDKN2A, CHEK2*, CTNNA1*, DICER1*, EPCAM (del/dup only), EGFR, FH*, FLCN*, GREM1 (promoter dup only), HOXB13, KIT, LZTR1, MAX*, MBD4, MEN1*, MET, MITF, MLH1*, MSH2*, MSH3*, MSH6*, MUTYH*, NF1*, NF2*, NTHL1*, PALB2*, PDGFRA, PMS2*, POLD1*, POLE*, POT1*, PRKAR1A*, PTCH1*, PTEN*, RAD51C*, RAD51D*, RB1*, RET, SDHA* (sequencing only), SDHAF2*, SDHB*, SDHC*, SDHD*, SMAD4*, SMARCA4*, SMARCB1*, SMARCE1*, STK11*, SUFU*, TMEM127*, TP53*, TSC1*, TSC2*, VHL*. RNA analysis is performed for * genes.   11/20/2022 Surgery   A. BREAST, LEFT, LUMPECTOMY:  Fibrosis and slight inflammation consistent with treatment effects  No residual invasive or in situ carcinoma (ypT0)  Margins: All margins negative for carcinoma  Lymphovascular space invasion: Not identified  Biopsy site and biopsy clip  See oncology table   14 LN biopsied negative for cancer.    01/22/2023 - 03/21/2023 Radiation Therapy   Plan Name: Breast_L_BH Site: Breast, Left Technique: 3D Mode: Photon Dose Per Fraction: 1.8 Gy Prescribed Dose (Delivered / Prescribed): 50.4 Gy / 50.4 Gy Prescribed Fxs (Delivered / Prescribed): 28 / 28   Plan Name: Brst_L_SCV_BH Site: Sclav-LT Technique: 3D Mode: Photon Dose Per Fraction: 1.8 Gy Prescribed Dose (Delivered / Prescribed): 50.4 Gy / 50.4 Gy Prescribed Fxs (Delivered / Prescribed): 28 / 28   Plan Name: Brst_L_BH_Bst Site: Breast, Left Technique: 3D Mode: Photon Dose Per Fraction: 2 Gy Prescribed Dose (Delivered / Prescribed): 10 Gy / 10 Gy Prescribed Fxs (  Delivered / Prescribed): 5 / 5   04/2023 -  Anti-estrogen oral therapy   Anastrozole      CHIEF COMPLIANT: Follow-up on anastrozole  with  Verzenio   HISTORY OF PRESENT ILLNESS:  History of Present Illness Lynn [name] is a 38 year old female with breast cancer who presents for follow-up on Verzenio  treatment.  She tolerates Verzenio  well, with manageable diarrhea occurring mainly after Ozempic  injections, controlled with occasional anti-diarrheal use. Blood tests show a decrease in hemoglobin from 11 to 9.5 and a neutrophil count of 1.9, accompanied by fatigue. She receives goserelin injections and experiences fluid retention in her breast, exacerbated by heat, but improved with swimming. She anticipates needing a renewal for physical therapy to manage the fluid retention.     ALLERGIES:  is allergic to macrobid [nitrofurantoin macrocrystal] and ibuprofen.  MEDICATIONS:  Current Outpatient Medications  Medication Sig Dispense Refill   ALPRAZolam  (XANAX ) 0.5 MG tablet Take 1 tablet (0.5 mg total) by mouth at bedtime as needed for anxiety. 30 tablet 3   aspirin-acetaminophen -caffeine (EXCEDRIN MIGRAINE) 250-250-65 MG tablet Take 1-2 tablets by mouth 2 (two) times daily as needed for headache or migraine.     Blood Glucose Monitoring Suppl (ACCU-CHEK AVIVA PLUS) w/Device KIT Use to check blood sugar once daily at 6 (six) AM. 1 kit 0   carvedilol  (COREG ) 25 MG tablet Take 1 tablet (25 mg total) by mouth 2 (two) times daily with a meal. 180 tablet 0   cyanocobalamin  (VITAMIN B12) 1000 MCG tablet Take 2,000 mcg by mouth daily.     escitalopram  (LEXAPRO ) 10 MG tablet Take 1 tablet (10 mg total) by mouth daily. 90 tablet 1   esomeprazole (NEXIUM) 20 MG capsule Take 20 mg by mouth daily at 12 noon.     gabapentin  (NEURONTIN ) 100 MG capsule Take 1 capsule (100 mg total) by mouth 3 (three) times daily. 90 capsule 3   glucose blood (ACCU-CHEK AVIVA PLUS) test strip Use as instructed 100 each 12   glucose blood (ACCU-CHEK GUIDE TEST) test strip Use to check blood sugar once a day at 6 am. 50 strip 0   goserelin (ZOLADEX ) 3.6 MG injection  Inject 3.6 mg into the skin every 28 (twenty-eight) days.     Lancets (ONETOUCH DELICA PLUS LANCET33G) MISC Use to check blood sugar once a day at 6 am. 100 each 0   letrozole  (FEMARA ) 2.5 MG tablet Take 1 tablet (2.5 mg total) by mouth daily. 90 tablet 3   lisinopril -hydrochlorothiazide  (ZESTORETIC ) 10-12.5 MG tablet Take 1 tablet by mouth daily. 90 tablet 1   ONE TOUCH CLUB LANCETS MISC 1 each by Does not apply route daily. 100 each 11   prochlorperazine  (COMPAZINE ) 10 MG tablet Take 1 tablet (10 mg total) by mouth every 6 (six) hours as needed for nausea or vomiting. 30 tablet 2   Semaglutide , 2 MG/DOSE, 8 MG/3ML SOPN Inject 2 mg as directed once a week. 3 mL 2   Vitamin D , Ergocalciferol , (DRISDOL ) 1.25 MG (50000 UNIT) CAPS capsule Take 1 capsule (50,000 Units total) by mouth every 7 (seven) days. 12 capsule 0   abemaciclib  (VERZENIO ) 150 MG tablet Take 1 tablet (150 mg total) by mouth 2 (two) times daily. 56 tablet 3   aspirin EC 81 MG tablet Take 81 mg by mouth daily. Swallow whole.     No current facility-administered medications for this visit.    PHYSICAL EXAMINATION: ECOG PERFORMANCE STATUS: 1 - Symptomatic but completely ambulatory  Vitals:   09/26/23 1450  BP: (!) 147/78  Pulse: (!) 109  Resp: 18  Temp: 97.7 F (36.5 C)  SpO2: 96%   Filed Weights   09/26/23 1450  Weight: (!) 340 lb 9.6 oz (154.5 kg)    Physical Exam   (exam performed in the presence of a chaperone)  LABORATORY DATA:  I have reviewed the data as listed    Latest Ref Rng & Units 08/29/2023   12:29 PM 08/01/2023    1:12 PM 06/06/2023   10:25 AM  CMP  Glucose 70 - 99 mg/dL 805  815  823   BUN 6 - 20 mg/dL 14  16  13    Creatinine 0.44 - 1.00 mg/dL 9.04  9.09  9.18   Sodium 135 - 145 mmol/L 134  133  136   Potassium 3.5 - 5.1 mmol/L 4.3  4.0  4.4   Chloride 98 - 111 mmol/L 98  100  103   CO2 22 - 32 mmol/L 30  28  29    Calcium 8.9 - 10.3 mg/dL 9.4  9.2  8.8   Total Protein 6.5 - 8.1 g/dL 7.2  7.3   6.9   Total Bilirubin 0.0 - 1.2 mg/dL 0.6  0.5  0.6   Alkaline Phos 38 - 126 U/L 71  69  56   AST 15 - 41 U/L 25  29  19    ALT 0 - 44 U/L 28  32  22     Lab Results  Component Value Date   WBC 3.0 (L) 09/26/2023   HGB 9.5 (L) 09/26/2023   HCT 26.4 (L) 09/26/2023   MCV 84.9 09/26/2023   PLT 155 09/26/2023   NEUTROABS 1.9 09/26/2023    ASSESSMENT & PLAN:  Malignant neoplasm of overlapping sites of left breast in female, 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   06/27/2022: CT CAP: Left breast cancer, 2 left axillary lymph nodes, left adnexal cystic lesion 4 cm    Treatment Plan:  1. Neoadjuvant chemotherapy with Adriamycin  and Cytoxan  dose dense 4 followed by Taxol  weekly 10 completed 10/16/2022 2. 11/20/2022: Left lumpectomy: No residual cancer, margins negative, 0/14 lymph nodes 3. Followed by adjuvant radiation therapy 01/22/2023-03/21/2023 4.  Followed by antiestrogen therapy started 04/03/2023 ____________________________________________________ Antiestrogen therapy: ovarian function suppression with Zoladex  along with anastrozole  with Verzenio  (started 04/10/23)X 2 years, switched to letrozole  06/06/2023   Current dose of Verzenio : 150 p.o. twice daily (started 08/12/2023) Verzinio toxicities: Headaches: Switch to letrozole   intermittent diarrhea and constipation Fatigue Decreased WBC count and anemia: For now we will keep the dosage the same but in a month we might have to reduce the dosage of Verzinio based on the blood counts.  Continuing chest wall lymphedema: I sent a new request for physical therapy Labs and follow-up in 1 month ------------------------------------- Assessment and Plan Assessment & Plan Anemia due to chemotherapy Anemia secondary to chemotherapy with Abemaciclib , hemoglobin at 9.5. Neutrophil count adequate at 1.9. Possible fatigue due to anemia. Current  dose tolerated with manageable diarrhea. - Monitor hemoglobin and neutrophil levels closely. - Schedule follow-up in one month to reassess blood counts and consider dose adjustment of Abemaciclib . - Advise her to report increased fatigue or weakness for potential earlier follow-up and blood test. - Continue current dose of Abemaciclib  if blood counts stabilize.  Fluid retention in breast Fluid retention exacerbated by heat and physical activity. Swimming aids management. - Coordinate with physical therapist for ongoing therapy for  breast fluid management.      No orders of the defined types were placed in this encounter.  The patient has a good understanding of the overall plan. she agrees with it. she will call with any problems that may develop before the next visit here. Total time spent: 30 mins including face to face time and time spent for planning, charting and co-ordination of care   Lynn MARLA Chad, MD 09/26/23

## 2023-09-30 ENCOUNTER — Other Ambulatory Visit: Payer: Self-pay

## 2023-10-02 ENCOUNTER — Other Ambulatory Visit: Payer: Self-pay

## 2023-10-02 NOTE — Progress Notes (Signed)
 Specialty Pharmacy Refill Coordination Note  Lynn [name] is a 38 y.o. female contacted today regarding refills of specialty medication(s) Abemaciclib  (VERZENIO )   Patient requested Delivery   Delivery date: 10/08/23   Verified address: [address] [place], [place] [address]   Medication will be filled on 10/07/23.

## 2023-10-07 ENCOUNTER — Other Ambulatory Visit (HOSPITAL_COMMUNITY): Payer: Self-pay

## 2023-10-08 ENCOUNTER — Other Ambulatory Visit: Payer: Self-pay

## 2023-10-08 ENCOUNTER — Ambulatory Visit (INDEPENDENT_AMBULATORY_CARE_PROVIDER_SITE_OTHER): Admitting: Nurse Practitioner

## 2023-10-08 VITALS — BP 122/72 | HR 68 | Temp 97.8°F | Ht 67.0 in | Wt 326.0 lb

## 2023-10-08 DIAGNOSIS — T451X5A Adverse effect of antineoplastic and immunosuppressive drugs, initial encounter: Secondary | ICD-10-CM

## 2023-10-08 DIAGNOSIS — E559 Vitamin D deficiency, unspecified: Secondary | ICD-10-CM

## 2023-10-08 DIAGNOSIS — E119 Type 2 diabetes mellitus without complications: Secondary | ICD-10-CM

## 2023-10-08 DIAGNOSIS — I10 Essential (primary) hypertension: Secondary | ICD-10-CM

## 2023-10-08 DIAGNOSIS — F32A Depression, unspecified: Secondary | ICD-10-CM

## 2023-10-08 DIAGNOSIS — F419 Anxiety disorder, unspecified: Secondary | ICD-10-CM | POA: Diagnosis not present

## 2023-10-08 DIAGNOSIS — R000 Tachycardia, unspecified: Secondary | ICD-10-CM

## 2023-10-08 DIAGNOSIS — G620 Drug-induced polyneuropathy: Secondary | ICD-10-CM | POA: Insufficient documentation

## 2023-10-08 DIAGNOSIS — R102 Pelvic and perineal pain: Secondary | ICD-10-CM | POA: Insufficient documentation

## 2023-10-08 DIAGNOSIS — Z0000 Encounter for general adult medical examination without abnormal findings: Secondary | ICD-10-CM | POA: Insufficient documentation

## 2023-10-08 DIAGNOSIS — Z1331 Encounter for screening for depression: Secondary | ICD-10-CM | POA: Insufficient documentation

## 2023-10-08 DIAGNOSIS — Z170 Estrogen receptor positive status [ER+]: Secondary | ICD-10-CM

## 2023-10-08 LAB — LIPID PANEL
Cholesterol: 148 mg/dL (ref 0–200)
HDL: 54.8 mg/dL (ref >39.00)
LDL Cholesterol: 64 mg/dL (ref 0–99)
NonHDL: 93.45
Total CHOL/HDL Ratio: 3
Triglycerides: 148 mg/dL (ref 0.0–149.0)
VLDL: 29.6 mg/dL (ref 0.0–40.0)

## 2023-10-08 LAB — HEMOGLOBIN A1C: Hgb A1c MFr Bld: 5.8 % (ref 4.6–6.5)

## 2023-10-08 LAB — TSH: TSH: 2.15 u[IU]/mL (ref 0.35–5.50)

## 2023-10-08 LAB — VITAMIN D 25 HYDROXY (VIT D DEFICIENCY, FRACTURES): VITD: 15.13 ng/mL — ABNORMAL LOW (ref 30.00–100.00)

## 2023-10-08 NOTE — Assessment & Plan Note (Signed)
 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 It is recommended to use a non-slip mat or dots in the bathtub to prevent falls. Foot exam normal today. Continue gabapentin  100mg  TID.

## 2023-10-08 NOTE — Assessment & Plan Note (Signed)
Health maintenance reviewed and updated. Discussed nutrition, exercise. Follow-up 1 year.

## 2023-10-08 NOTE — Progress Notes (Signed)
 BP 122/72 (BP Location: Right Arm, Patient Position: Sitting, Cuff Size: Normal)   Pulse 68   Temp 97.8 F (36.6 C) (Temporal)   Ht 5' 7 (1.702 m)   Wt (!) 326 lb (147.9 kg)   SpO2 99%   BMI 51.06 kg/m    Subjective:    Patient ID: Lynn [name], female    DOB: November 25, 1985, 38 y.o.   MRN: [id number]  CC: Chief Complaint  Patient presents with   Annual Exam    Pharmacist took off the depression medication can possible intact with chemo medication. Wants a referrel to obgyn for ovary pain just started about 2 months ago     HPI: Lynn [name] is a 38 y.o. female presenting on 10/08/2023 for comprehensive medical examination. Current medical complaints include:pelvic pain  Discussed the use of AI scribe software for clinical note transcription with the patient, who gave verbal consent to proceed.  History of Present Illness   Lynn [name] is a 38 year old female who presents with medication management and ovarian pain.  She experiences severe bilateral ovarian pain, which began after childbirth ten years ago and has recently recurred with two episodes in the past two months. The pain is exacerbated by movement and causes significant distress. A recent CT scan showed no abnormalities. She has a history of ovarian cysts and past ovarian torsion. She would like a referral to GYN.   She discontinued Lexapro  due to potential interactions with Femara  and feels anxious about this interaction, though she has not experienced adverse effects. She occasionally uses Xanax  for situational anxiety or difficulty sleeping. She acknowledges a challenging year and a half due to personal losses and a cancer diagnosis.      She currently lives with: husband, daughter  Depression and Anxiety Screen done today and results listed below:     10/08/2023   10:14 AM 07/11/2023    9:05 AM 02/18/2023    1:16 PM 06/11/2022    8:33 AM 03/12/2022    8:25 AM  Depression screen PHQ 2/9  Decreased Interest 1  1 0 1 0  Down, Depressed, Hopeless 1 1 0 1 0  PHQ - 2 Score 2 2 0 2 0  Altered sleeping 1 2 1 1    Tired, decreased energy 3 3 1 1    Change in appetite 3 3 1  0   Feeling bad or failure about yourself  2 3 0 2   Trouble concentrating 0 1 0 2   Moving slowly or fidgety/restless 0 0 0 0   Suicidal thoughts 0 0 0 0   PHQ-9 Score 11 14 3 8    Difficult doing work/chores Somewhat difficult Somewhat difficult Not difficult at all Somewhat difficult       10/08/2023   10:14 AM 07/11/2023    9:05 AM 02/18/2023    1:16 PM 06/11/2022    8:33 AM  GAD 7 : Generalized Anxiety Score  Nervous, Anxious, on Edge 1 3 1 3   Control/stop worrying 1 3 1 3   Worry too much - different things 2 3 1 3   Trouble relaxing 1 2 1 3   Restless 0 0 0 2  Easily annoyed or irritable 2 3 1 2   Afraid - awful might happen 2 3 0 3  Total GAD 7 Score 9 17 5 19   Anxiety Difficulty  Somewhat difficult Somewhat difficult Somewhat difficult    The patient does not have a history of falls. I did not complete a risk assessment  for falls. A plan of care for falls was not documented.   Past Medical History:  Past Medical History:  Diagnosis Date   Anemia    Breast cancer (HCC) 02/28   Cancer (HCC) 2024   L breast   Carpal tunnel syndrome, left    Depression    Diabetes (HCC)    Dysrhythmia    Palpitations   GERD (gastroesophageal reflux disease)    Hypertension    Iron deficiency    Panic attacks    PCOS (polycystic ovarian syndrome)    Personal history of chemotherapy    Pneumonia    Port-A-Cath in place 07/31/2022   Tachycardia    Umbilical hernia     Surgical History:  Past Surgical History:  Procedure Laterality Date   ADENOIDECTOMY     As a child   AXILLARY LYMPH NODE DISSECTION Left 11/20/2022   Procedure: LEFT AXILLARY DISSECTION;  Surgeon: Ebbie Cough, MD;  Location: [facility] OR;  Service: General;  Laterality: Left;   BREAST BIOPSY Left 05/29/2022   US  LT BREAST BX W LOC DEV 1ST LESION IMG BX SPEC  US  GUIDE 05/29/2022 [facility]   BREAST BIOPSY Left 05/29/2022   US  LT BREAST BX W LOC DEV EA ADD LESION IMG BX SPEC US  GUIDE 05/29/2022 [facility]   BREAST BIOPSY Left 06/06/2022   MM LT BREAST BX W LOC DEV 1ST LESION IMAGE BX SPEC STEREO GUIDE 06/06/2022 [facility]   BREAST BIOPSY  11/19/2022   MM LT RADIOACTIVE SEED LOC MAMMO GUIDE 11/19/2022 [facility]   BREAST BIOPSY  11/19/2022   MM LT RADIOACTIVE SEED EA ADD LESION LOC MAMMO GUIDE 11/19/2022 [facility]   BREAST BIOPSY  11/19/2022   MM LT RADIOACTIVE SEED EA ADD LESION LOC MAMMO GUIDE 11/19/2022 [facility]   BREAST LUMPECTOMY WITH RADIOACTIVE SEED AND AXILLARY LYMPH NODE DISSECTION Left 11/20/2022   Procedure: LEFT BREAST LUMPECTOMY WITH RADIOACTIVE SEED;  Surgeon: Ebbie Cough, MD;  Location: [facility] OR;  Service: General;  Laterality: Left;  PEC BLOCK   CESAREAN SECTION  2015   PORT-A-CATH REMOVAL Right 11/20/2022   Procedure: REMOVAL PORT-A-CATH;  Surgeon: Ebbie Cough, MD;  Location: [facility] OR;  Service: General;  Laterality: Right;   PORTACATH PLACEMENT N/A 06/19/2022   Procedure: INSERTION PORT-A-CATH;  Surgeon: Ebbie Cough, MD;  Location: [facility] OR;  Service: General;  Laterality: N/A;   TONSILLECTOMY     As a child    Medications:  Current Outpatient Medications on File Prior to Visit  Medication Sig   abemaciclib  (VERZENIO ) 150 MG tablet Take 1 tablet (150 mg total) by mouth 2 (two) times daily.   ALPRAZolam  (XANAX ) 0.5 MG tablet Take 1 tablet (0.5 mg total) by mouth at bedtime as needed for anxiety.   aspirin EC 81 MG tablet Take 81 mg by mouth daily. Swallow whole.   aspirin-acetaminophen -caffeine (EXCEDRIN MIGRAINE) 250-250-65 MG tablet Take 1-2 tablets by mouth 2 (two) times daily as needed for headache or migraine.   Blood Glucose Monitoring Suppl (ACCU-CHEK AVIVA PLUS) w/Device KIT Use to check blood sugar once daily at 6 (six) AM.   carvedilol  (COREG ) 25 MG tablet Take 1  tablet (25 mg total) by mouth 2 (two) times daily with a meal.   cholecalciferol (VITAMIN D3) 25 MCG (1000 UNIT) tablet Take 1,000 Units by mouth daily.   esomeprazole (NEXIUM) 20 MG capsule Take 20 mg by mouth daily at 12 noon.   gabapentin  (NEURONTIN ) 100 MG capsule Take 1 capsule (100 mg  total) by mouth 3 (three) times daily.   glucose blood (ACCU-CHEK AVIVA PLUS) test strip Use as instructed   glucose blood (ACCU-CHEK GUIDE TEST) test strip Use to check blood sugar once a day at 6 am.   goserelin (ZOLADEX ) 3.6 MG injection Inject 3.6 mg into the skin every 28 (twenty-eight) days.   Lancets (ONETOUCH DELICA PLUS LANCET33G) MISC Use to check blood sugar once a day at 6 am.   letrozole  (FEMARA ) 2.5 MG tablet Take 1 tablet (2.5 mg total) by mouth daily.   lisinopril -hydrochlorothiazide  (ZESTORETIC ) 10-12.5 MG tablet Take 1 tablet by mouth daily.   ONE TOUCH CLUB LANCETS MISC 1 each by Does not apply route daily.   prochlorperazine  (COMPAZINE ) 10 MG tablet Take 1 tablet (10 mg total) by mouth every 6 (six) hours as needed for nausea or vomiting.   Semaglutide , 2 MG/DOSE, 8 MG/3ML SOPN Inject 2 mg as directed once a week.   No current facility-administered medications on file prior to visit.    Allergies:  Allergies  Allergen Reactions   Macrobid [Nitrofurantoin Macrocrystal] Shortness Of Breath and Nausea And Vomiting   Ibuprofen Hives    Social History:  Social History   Socioeconomic History   Marital status: Married    Spouse name: Not on file   Number of children: 1   Years of education: some college   Highest education level: Associate degree: occupational, Scientist, product/process development, or vocational program  Occupational History   Not on file  Tobacco Use   Smoking status: Former    Current packs/day: 0.00    Average packs/day: 1 pack/day for 7.0 years (7.0 ttl pk-yrs)    Types: Cigarettes, E-cigarettes    Start date: 03/20/2007    Quit date: 10/29/2021    Years since quitting: 1.9    Smokeless tobacco: Never   Tobacco comments:    Smoked cigarettes for 7 years, quit in 2015. Started vaping 2020 and just quit in July this year.  Vaping Use   Vaping status: Former   Quit date: 04/03/2019  Substance and Sexual Activity   Alcohol use: Not Currently    Comment: Only on special occasions   Drug use: Yes    Frequency: 1.0 times per week    Types: Marijuana   Sexual activity: Yes    Birth control/protection: Condom    Comment: Also on Zoladex  implant for [facility]  Other Topics Concern   Not on file  Social History Narrative      Social Drivers of Health   Financial Resource Strain: Medium Risk (10/08/2023)   Overall Financial Resource Strain (CARDIA)    Difficulty of Paying Living Expenses: Somewhat hard  Food Insecurity: Food Insecurity Present (10/08/2023)   Hunger Vital Sign    Worried About Running Out of Food in the Last Year: Sometimes true    Ran Out of Food in the Last Year: Sometimes true  Transportation Needs: No Transportation Needs (10/08/2023)   PRAPARE - Administrator, Civil Service (Medical): No    Lack of Transportation (Non-Medical): No  Physical Activity: Insufficiently Active (10/08/2023)   Exercise Vital Sign    Days of Exercise per Week: 1 day    Minutes of Exercise per Session: 10 min  Stress: Stress Concern Present (10/08/2023)   [employer] of Occupational Health - Occupational Stress Questionnaire    Feeling of Stress: To some extent  Social Connections: Socially Integrated (10/08/2023)   Social Connection and Isolation Panel    Frequency of Communication with  Friends and Family: Twice a week    Frequency of Social Gatherings with Friends and Family: Once a week    Attends Religious Services: More than 4 times per year    Active Member of [employer] or Organizations: Yes    Attends Engineer, structural: More than 4 times per year    Marital Status: Married  Catering manager Violence: Not At Risk (11/20/2022)   Humiliation,  Afraid, Rape, and Kick questionnaire    Fear of Current or Ex-Partner: No    Emotionally Abused: No    Physically Abused: No    Sexually Abused: No   Social History   Tobacco Use  Smoking Status Former   Current packs/day: 0.00   Average packs/day: 1 pack/day for 7.0 years (7.0 ttl pk-yrs)   Types: Cigarettes, E-cigarettes   Start date: 03/20/2007   Quit date: 10/29/2021   Years since quitting: 1.9  Smokeless Tobacco Never  Tobacco Comments   Smoked cigarettes for 7 years, quit in 2015. Started vaping 2020 and just quit in July this year.   Social History   Substance and Sexual Activity  Alcohol Use Not Currently   Comment: Only on special occasions    Family History:  Family History  Problem Relation Age of Onset   Heart disease Mother    Diabetes Mother    Hypertension Mother    Kidney disease Mother    Heart failure Mother    Heart attack Mother 77   Arthritis Mother    Asthma Mother    COPD Mother    Depression Mother    Hyperlipidemia Mother    Mental illness Mother    Bladder Cancer Mother    Anemia Mother    Anxiety disorder Mother    Early death Mother    Obesity Mother    Stroke Mother    Varicose Veins Mother    Heart disease Father    Heart attack Father 12   COPD Father    Hyperlipidemia Father    Hypertension Father    Mental illness Father    Anxiety disorder Father    Early death Father    Obesity Father    Varicose Veins Father    Arthritis Sister    Depression Sister    Diabetes Sister    Hypertension Sister    Mental illness Sister    Miscarriages / Stillbirths Sister    Anxiety disorder Sister    Obesity Sister    Varicose Veins Sister    Alcohol abuse Sister    Depression Sister    Diabetes Sister    Heart disease Sister    Heart attack Sister 52   Arthritis Sister    Kidney disease Sister    Transient ischemic attack Sister    Hypertension Sister    Anxiety disorder Sister    Obesity Sister    Stroke Sister    Bladder  Cancer Maternal Aunt    Colon cancer Maternal Aunt 80 - 29   Other Maternal Aunt 76 - 59       brain tumor (unknown if benign or malignant)   Lung cancer Paternal Aunt 66   Melanoma Paternal Aunt 78 - 49   Heart failure Maternal Grandmother    Arthritis Maternal Grandmother    Alcohol abuse Maternal Grandmother    COPD Maternal Grandmother    Diabetes Maternal Grandmother    Hearing loss Maternal Grandmother    Heart disease Maternal Grandmother    Hyperlipidemia Maternal Grandmother  Hypertension Maternal Grandmother    Stroke Maternal Grandmother    Miscarriages / Stillbirths Maternal Grandmother    Mental illness Maternal Grandmother    Heart attack Maternal Grandmother    Obesity Maternal Grandmother    Heart failure Maternal Grandfather    Alcohol abuse Maternal Grandfather    Arthritis Maternal Grandfather    Early death Maternal Grandfather    Heart disease Maternal Grandfather    Hyperlipidemia Maternal Grandfather    Hypertension Maternal Grandfather    Heart attack Maternal Grandfather 60   Mental illness Maternal Grandfather    Heart failure Paternal Grandmother    Arthritis Paternal Grandmother    Diabetes Paternal Grandmother    Heart attack Paternal Grandmother    Miscarriages / Stillbirths Paternal Grandmother    Mental illness Paternal Grandmother    Kidney disease Paternal Grandmother    Intellectual disability Paternal Grandmother    Hypertension Paternal Grandmother    Hyperlipidemia Paternal Grandmother    Heart disease Paternal Grandmother    Obesity Paternal Grandmother    Heart failure Paternal Grandfather    Arthritis Paternal Grandfather    Heart attack Paternal Grandfather    Mental illness Paternal Grandfather    Hearing loss Paternal Grandfather    Heart disease Paternal Grandfather    Hyperlipidemia Paternal Grandfather    Hypertension Paternal Grandfather    Breast cancer Neg Hx    Prostate cancer Neg Hx    Ovarian cancer Neg Hx     Endometrial cancer Neg Hx    Pancreatic cancer Neg Hx     Past medical history, surgical history, medications, allergies, family history and social history reviewed with patient today and changes made to appropriate areas of the chart.   Review of Systems  Constitutional:  Positive for malaise/fatigue. Negative for fever.  HENT: Negative.    Eyes: Negative.   Respiratory: Negative.    Cardiovascular:  Positive for palpitations. Negative for chest pain and leg swelling.  Gastrointestinal:  Positive for abdominal pain (pelvic). Negative for constipation, diarrhea and nausea.  Genitourinary: Negative.   Musculoskeletal: Negative.   Skin: Negative.   Neurological:  Positive for tingling (feet at times).  Psychiatric/Behavioral: Negative.     All other ROS negative except what is listed above and in the HPI.      Objective:    BP 122/72 (BP Location: Right Arm, Patient Position: Sitting, Cuff Size: Normal)   Pulse 68   Temp 97.8 F (36.6 C) (Temporal)   Ht 5' 7 (1.702 m)   Wt (!) 326 lb (147.9 kg)   SpO2 99%   BMI 51.06 kg/m   Wt Readings from Last 3 Encounters:  10/08/23 (!) 326 lb (147.9 kg)  09/26/23 (!) 340 lb 9.6 oz (154.5 kg)  08/29/23 (!) 342 lb 12.8 oz (155.5 kg)    Physical Exam Vitals and nursing note reviewed.  Constitutional:      General: She is not in acute distress.    Appearance: Normal appearance.  HENT:     Head: Normocephalic and atraumatic.     Right Ear: Tympanic membrane, ear canal and external ear normal.     Left Ear: Tympanic membrane, ear canal and external ear normal.     Mouth/Throat:     Mouth: Mucous membranes are moist.     Pharynx: No posterior oropharyngeal erythema.  Eyes:     Conjunctiva/sclera: Conjunctivae normal.  Cardiovascular:     Rate and Rhythm: Normal rate and regular rhythm.     Pulses:  Normal pulses.     Heart sounds: Normal heart sounds.  Pulmonary:     Effort: Pulmonary effort is normal.     Breath sounds: Normal  breath sounds.  Abdominal:     Palpations: Abdomen is soft.     Tenderness: There is abdominal tenderness (slight RLQ).  Musculoskeletal:        General: Normal range of motion.     Cervical back: Normal range of motion and neck supple.     Right lower leg: No edema.     Left lower leg: No edema.  Lymphadenopathy:     Cervical: No cervical adenopathy.  Skin:    General: Skin is warm and dry.  Neurological:     General: No focal deficit present.     Mental Status: She is alert and oriented to person, place, and time.     Cranial Nerves: No cranial nerve deficit.     Coordination: Coordination normal.     Gait: Gait normal.  Psychiatric:        Mood and Affect: Mood normal.        Behavior: Behavior normal.        Thought Content: Thought content normal.        Judgment: Judgment normal.    Diabetic Foot Exam - Simple   Simple Foot Form Visual Inspection No deformities, no ulcerations, no other skin breakdown bilaterally: Yes Sensation Testing Intact to touch and monofilament testing bilaterally: Yes Pulse Check Posterior Tibialis and Dorsalis pulse intact bilaterally: Yes Comments      Results for orders placed or performed in visit on 09/26/23  CMP (Cancer Center only)   Collection Time: 09/26/23  2:19 PM  Result Value Ref Range   Sodium 136 135 - 145 mmol/L   Potassium 3.8 3.5 - 5.1 mmol/L   Chloride 102 98 - 111 mmol/L   CO2 27 22 - 32 mmol/L   Glucose, Bld 177 (H) 70 - 99 mg/dL   BUN 17 6 - 20 mg/dL   Creatinine 9.10 9.55 - 1.00 mg/dL   Calcium 9.2 8.9 - 89.6 mg/dL   Total Protein 6.9 6.5 - 8.1 g/dL   Albumin 3.9 3.5 - 5.0 g/dL   AST 23 15 - 41 U/L   ALT 27 0 - 44 U/L   Alkaline Phosphatase 58 38 - 126 U/L   Total Bilirubin 0.5 0.0 - 1.2 mg/dL   GFR, Estimated >39 >39 mL/min   Anion gap 7 5 - 15  CBC with Differential (Cancer Center Only)   Collection Time: 09/26/23  2:19 PM  Result Value Ref Range   WBC Count 3.0 (L) 4.0 - 10.5 K/uL   RBC 3.11 (L)  3.87 - 5.11 MIL/uL   Hemoglobin 9.5 (L) 12.0 - 15.0 g/dL   HCT 73.5 (L) 63.9 - 53.9 %   MCV 84.9 80.0 - 100.0 fL   MCH 30.5 26.0 - 34.0 pg   MCHC 36.0 30.0 - 36.0 g/dL   RDW 84.1 (H) 88.4 - 84.4 %   Platelet Count 155 150 - 400 K/uL   nRBC 0.0 0.0 - 0.2 %   Neutrophils Relative % 64 %   Neutro Abs 1.9 1.7 - 7.7 K/uL   Lymphocytes Relative 25 %   Lymphs Abs 0.8 0.7 - 4.0 K/uL   Monocytes Relative 8 %   Monocytes Absolute 0.3 0.1 - 1.0 K/uL   Eosinophils Relative 2 %   Eosinophils Absolute 0.1 0.0 - 0.5 K/uL   Basophils Relative  1 %   Basophils Absolute 0.0 0.0 - 0.1 K/uL   Immature Granulocytes 0 %   Abs Immature Granulocytes 0.00 0.00 - 0.07 K/uL      Assessment & Plan:   Problem List Items Addressed This Visit       Cardiovascular and Mediastinum   Essential hypertension   Well-controlled with current medication. Continue lisinopril -hctz 10-12.5mg  daily and coreg  25mg  BID. Recent CMP, CBC reviewed.       Relevant Orders   TSH     Endocrine   Type 2 diabetes mellitus (HCC)   Chronic, stable. Check CMP, CBC, A1c today. Continue ozempic  2mg  weekly. Recommend yearly eye exam. Follow-up in 6 months.       Relevant Orders   Lipid panel   Hemoglobin A1c   TSH     Nervous and Auditory   Chemotherapy-induced neuropathy (HCC)   Neuropathy in her feet is likely exacerbated by chemotherapy, causing numbness and balance issues, worsened by higher glucose intake and missed Neurontin  doses. It is recommended to use a non-slip mat or dots in the bathtub to prevent falls. Foot exam normal today. Continue gabapentin  100mg  TID.        Other   Chronic tachycardia   Chronic, stable.  Continue carvedilol  25 mg twice daily.  Continue collaboration and recommendations from cardiology.       Relevant Orders   TSH   Morbid obesity (HCC)   BMI is 51. Discussed nutrition, exercise.       Relevant Orders   TSH   Vitamin D  deficiency   Will check vitamin D  levels today and adjust  regimen based on results.       Relevant Orders   VITAMIN D  25 Hydroxy (Vit-D Deficiency, Fractures)   Malignant neoplasm of overlapping sites of left breast in female, estrogen receptor positive (HCC)   She is undergoing treatment with Zoladex , letrozole , and Verzenio . Lymphedema is managed with physical therapy. Considering laparoscopic removal of tubes and ovaries to eliminate Zoladex  due to discomfort. Continue collaboration and recommendations from oncology.      Anxiety and depression   She previously used Lexapro  but discontinued it due to an interaction with Femara . She manages anxiety with Xanax  as needed and reports doing okay off Lexapro  despite personal losses and a cancer diagnosis.      Pelvic pain   She experiences intermittent severe bilateral ovarian pain. A previous CT scan was normal. An oncologist suggested a partial hysterectomy, but a GYN oncologist advised against it due to her age and the potential cardiac benefits of retaining her ovaries. She will be referred to an OBGYN for further evaluation.      Relevant Orders   Ambulatory referral to Gynecology   Routine general medical examination at a health care facility - Primary   Health maintenance reviewed and updated. Discussed nutrition, exercise. Follow-up 1 year.         Follow up plan: Return in about 6 months (around 04/09/2024) for Diabetes.   LABORATORY TESTING:  - Pap smear: up to date  IMMUNIZATIONS:   - Tdap: Tetanus vaccination status reviewed: last tetanus booster within 10 years. - Influenza: Postponed to flu season - Pneumovax: Not applicable - Prevnar: Declined - HPV: Not applicable - Shingrix vaccine: Not applicable  SCREENING: -Mammogram: Up to date  - Colonoscopy: Not applicable  - Bone Density: Not applicable   PATIENT COUNSELING:   Advised to take 1 mg of folate supplement per day if capable of pregnancy.  Sexuality: Discussed sexually transmitted diseases, partner selection,  use of condoms, avoidance of unintended pregnancy  and contraceptive alternatives.   Advised to avoid cigarette smoking.  I discussed with the patient that most people either abstain from alcohol or drink within safe limits (<=14/week and <=4 drinks/occasion for males, <=7/weeks and <= 3 drinks/occasion for females) and that the risk for alcohol disorders and other health effects rises proportionally with the number of drinks per week and how often a drinker exceeds daily limits.  Discussed cessation/primary prevention of drug use and availability of treatment for abuse.   Diet: Encouraged to adjust caloric intake to maintain  or achieve ideal body weight, to reduce intake of dietary saturated fat and total fat, to limit sodium intake by avoiding high sodium foods and not adding table salt, and to maintain adequate dietary potassium and calcium preferably from fresh fruits, vegetables, and low-fat dairy products.    stressed the importance of regular exercise  Injury prevention: Discussed safety belts, safety helmets, smoke detector, smoking near bedding or upholstery.   Dental health: Discussed importance of regular tooth brushing, flossing, and dental visits.    NEXT PREVENTATIVE PHYSICAL DUE IN 1 YEAR. Return in about 6 months (around 04/09/2024) for Diabetes.  Lequan Dobratz [employer]

## 2023-10-08 NOTE — Assessment & Plan Note (Signed)
 She previously used Lexapro  but discontinued it due to an interaction with Femara . She manages anxiety with Xanax  as needed and reports doing okay off Lexapro  despite personal losses and a cancer diagnosis.

## 2023-10-08 NOTE — Assessment & Plan Note (Signed)
 Chronic, stable.  Continue carvedilol  25 mg twice daily.  Continue collaboration and recommendations from cardiology.

## 2023-10-08 NOTE — Assessment & Plan Note (Signed)
 Chronic, stable. Check CMP, CBC, A1c today. Continue ozempic  2mg  weekly. Recommend yearly eye exam. Follow-up in 6 months.

## 2023-10-08 NOTE — Assessment & Plan Note (Signed)
 Well-controlled with current medication. Continue lisinopril -hctz 10-12.5mg  daily and coreg  25mg  BID. Recent CMP, CBC reviewed.

## 2023-10-08 NOTE — Progress Notes (Signed)
 Specialty Pharmacy Ongoing Clinical Assessment Note  Lynn [name] is a 38 y.o. female who is being followed by the specialty pharmacy service for RxSp Oncology   Patient's specialty medication(s) reviewed today: Abemaciclib  (VERZENIO )   Missed doses in the last 4 weeks: 0   Patient/Caregiver did not have any additional questions or concerns.   Therapeutic benefit summary: Patient is achieving benefit   Adverse events/side effects summary: Experienced adverse events/side effects (after dose increase, pt has more nausea, diarrhea. Was able to control with compazine  daily but may have dose decreased at next appt due to labs)   Patient's therapy is appropriate to: Continue    Goals Addressed             This Visit's Progress    Maintain optimal adherence to therapy   On track    Patient is on track. Patient will maintain adherence          Follow up: 3 months  [facility] Specialty Pharmacist

## 2023-10-08 NOTE — Assessment & Plan Note (Signed)
 She experiences intermittent severe bilateral ovarian pain. A previous CT scan was normal. An oncologist suggested a partial hysterectomy, but a GYN oncologist advised against it due to her age and the potential cardiac benefits of retaining her ovaries. She will be referred to an OBGYN for further evaluation.

## 2023-10-08 NOTE — Patient Instructions (Signed)
 It was great to see you!  We are checking your labs today and will let you know the results via mychart/phone.   I have placed a referral to GYN  Let's follow-up in 6 months, sooner if you have concerns.  If a referral was placed today, you will be contacted for an appointment. Please note that routine referrals can sometimes take up to 3-4 weeks to process. Please call our office if you haven't heard anything after this time frame.  Take care,  Tinnie Harada, NP

## 2023-10-08 NOTE — Assessment & Plan Note (Signed)
 She is undergoing treatment with Zoladex, letrozole, and Verzenio. Lymphedema is managed with physical therapy. Considering laparoscopic removal of tubes and ovaries to eliminate Zoladex due to discomfort. Continue collaboration and recommendations from oncology.

## 2023-10-08 NOTE — Assessment & Plan Note (Signed)
Will check vitamin D levels today and adjust regimen based on results

## 2023-10-08 NOTE — Assessment & Plan Note (Signed)
 BMI is 51. Discussed nutrition, exercise.

## 2023-10-09 ENCOUNTER — Ambulatory Visit: Payer: Self-pay | Admitting: Nurse Practitioner

## 2023-10-09 ENCOUNTER — Other Ambulatory Visit (HOSPITAL_BASED_OUTPATIENT_CLINIC_OR_DEPARTMENT_OTHER): Payer: Self-pay

## 2023-10-09 ENCOUNTER — Other Ambulatory Visit: Payer: Self-pay

## 2023-10-09 ENCOUNTER — Other Ambulatory Visit (HOSPITAL_COMMUNITY): Payer: Self-pay

## 2023-10-09 MED ORDER — VITAMIN D (ERGOCALCIFEROL) 1.25 MG (50000 UNIT) PO CAPS
50000.0000 [IU] | ORAL_CAPSULE | ORAL | 0 refills | Status: DC
  Filled 2023-10-09 – 2023-10-10 (×2): qty 12, 84d supply, fill #0

## 2023-10-10 ENCOUNTER — Other Ambulatory Visit: Payer: Self-pay

## 2023-10-10 ENCOUNTER — Other Ambulatory Visit (HOSPITAL_COMMUNITY): Payer: Self-pay

## 2023-10-11 ENCOUNTER — Other Ambulatory Visit (HOSPITAL_COMMUNITY): Payer: Self-pay

## 2023-10-14 ENCOUNTER — Other Ambulatory Visit: Payer: Self-pay

## 2023-10-14 ENCOUNTER — Inpatient Hospital Stay: Attending: Hematology and Oncology

## 2023-10-14 ENCOUNTER — Ambulatory Visit (HOSPITAL_BASED_OUTPATIENT_CLINIC_OR_DEPARTMENT_OTHER)
Admission: RE | Admit: 2023-10-14 | Discharge: 2023-10-14 | Disposition: A | Discharge status: Home / Self Care | Source: Ambulatory Visit | Attending: Physician Assistant | Admitting: Physician Assistant

## 2023-10-14 ENCOUNTER — Inpatient Hospital Stay (HOSPITAL_BASED_OUTPATIENT_CLINIC_OR_DEPARTMENT_OTHER): Admitting: Physician Assistant

## 2023-10-14 ENCOUNTER — Telehealth: Payer: Self-pay

## 2023-10-14 ENCOUNTER — Other Ambulatory Visit (HOSPITAL_COMMUNITY): Payer: Self-pay

## 2023-10-14 VITALS — BP 145/88 | HR 96 | Temp 97.3°F | Resp 16 | Wt 324.8 lb

## 2023-10-14 DIAGNOSIS — R1031 Right lower quadrant pain: Secondary | ICD-10-CM | POA: Diagnosis not present

## 2023-10-14 DIAGNOSIS — Z7982 Long term (current) use of aspirin: Secondary | ICD-10-CM | POA: Insufficient documentation

## 2023-10-14 DIAGNOSIS — E282 Polycystic ovarian syndrome: Secondary | ICD-10-CM | POA: Diagnosis not present

## 2023-10-14 DIAGNOSIS — R300 Dysuria: Secondary | ICD-10-CM | POA: Diagnosis not present

## 2023-10-14 DIAGNOSIS — R197 Diarrhea, unspecified: Secondary | ICD-10-CM

## 2023-10-14 DIAGNOSIS — K529 Noninfective gastroenteritis and colitis, unspecified: Secondary | ICD-10-CM | POA: Insufficient documentation

## 2023-10-14 DIAGNOSIS — K429 Umbilical hernia without obstruction or gangrene: Secondary | ICD-10-CM | POA: Diagnosis not present

## 2023-10-14 DIAGNOSIS — Z87440 Personal history of urinary (tract) infections: Secondary | ICD-10-CM | POA: Diagnosis not present

## 2023-10-14 DIAGNOSIS — Z8052 Family history of malignant neoplasm of bladder: Secondary | ICD-10-CM | POA: Diagnosis not present

## 2023-10-14 DIAGNOSIS — D61818 Other pancytopenia: Secondary | ICD-10-CM | POA: Diagnosis not present

## 2023-10-14 DIAGNOSIS — Z79811 Long term (current) use of aromatase inhibitors: Secondary | ICD-10-CM | POA: Insufficient documentation

## 2023-10-14 DIAGNOSIS — Z7985 Long-term (current) use of injectable non-insulin antidiabetic drugs: Secondary | ICD-10-CM | POA: Insufficient documentation

## 2023-10-14 DIAGNOSIS — C50812 Malignant neoplasm of overlapping sites of left female breast: Secondary | ICD-10-CM | POA: Diagnosis not present

## 2023-10-14 DIAGNOSIS — Z1732 Human epidermal growth factor receptor 2 negative status: Secondary | ICD-10-CM | POA: Insufficient documentation

## 2023-10-14 DIAGNOSIS — Z5111 Encounter for antineoplastic chemotherapy: Secondary | ICD-10-CM | POA: Insufficient documentation

## 2023-10-14 DIAGNOSIS — K449 Diaphragmatic hernia without obstruction or gangrene: Secondary | ICD-10-CM | POA: Insufficient documentation

## 2023-10-14 DIAGNOSIS — R112 Nausea with vomiting, unspecified: Secondary | ICD-10-CM

## 2023-10-14 DIAGNOSIS — T451X5A Adverse effect of antineoplastic and immunosuppressive drugs, initial encounter: Secondary | ICD-10-CM | POA: Diagnosis not present

## 2023-10-14 DIAGNOSIS — Z170 Estrogen receptor positive status [ER+]: Secondary | ICD-10-CM | POA: Insufficient documentation

## 2023-10-14 DIAGNOSIS — Z79899 Other long term (current) drug therapy: Secondary | ICD-10-CM | POA: Diagnosis not present

## 2023-10-14 DIAGNOSIS — R634 Abnormal weight loss: Secondary | ICD-10-CM | POA: Insufficient documentation

## 2023-10-14 DIAGNOSIS — Z1721 Progesterone receptor positive status: Secondary | ICD-10-CM | POA: Diagnosis not present

## 2023-10-14 DIAGNOSIS — K219 Gastro-esophageal reflux disease without esophagitis: Secondary | ICD-10-CM | POA: Diagnosis not present

## 2023-10-14 DIAGNOSIS — D6481 Anemia due to antineoplastic chemotherapy: Secondary | ICD-10-CM | POA: Insufficient documentation

## 2023-10-14 DIAGNOSIS — Z87891 Personal history of nicotine dependence: Secondary | ICD-10-CM | POA: Insufficient documentation

## 2023-10-14 DIAGNOSIS — R160 Hepatomegaly, not elsewhere classified: Secondary | ICD-10-CM | POA: Diagnosis not present

## 2023-10-14 DIAGNOSIS — I10 Essential (primary) hypertension: Secondary | ICD-10-CM | POA: Diagnosis not present

## 2023-10-14 LAB — CBC WITH DIFFERENTIAL (CANCER CENTER ONLY)
Abs Immature Granulocytes: 0.01 K/uL (ref 0.00–0.07)
Basophils Absolute: 0 K/uL (ref 0.0–0.1)
Basophils Relative: 1 %
Eosinophils Absolute: 0.2 K/uL (ref 0.0–0.5)
Eosinophils Relative: 5 %
HCT: 24.8 % — ABNORMAL LOW (ref 36.0–46.0)
Hemoglobin: 9 g/dL — ABNORMAL LOW (ref 12.0–15.0)
Immature Granulocytes: 0 %
Lymphocytes Relative: 17 %
Lymphs Abs: 0.6 K/uL — ABNORMAL LOW (ref 0.7–4.0)
MCH: 31.1 pg (ref 26.0–34.0)
MCHC: 36.3 g/dL — ABNORMAL HIGH (ref 30.0–36.0)
MCV: 85.8 fL (ref 80.0–100.0)
Monocytes Absolute: 0.3 K/uL (ref 0.1–1.0)
Monocytes Relative: 8 %
Neutro Abs: 2.4 K/uL (ref 1.7–7.7)
Neutrophils Relative %: 69 %
Platelet Count: 165 K/uL (ref 150–400)
RBC: 2.89 MIL/uL — ABNORMAL LOW (ref 3.87–5.11)
RDW: 15.8 % — ABNORMAL HIGH (ref 11.5–15.5)
WBC Count: 3.5 K/uL — ABNORMAL LOW (ref 4.0–10.5)
nRBC: 0 % (ref 0.0–0.2)

## 2023-10-14 LAB — CMP (CANCER CENTER ONLY)
ALT: 22 U/L (ref 0–44)
AST: 27 U/L (ref 15–41)
Albumin: 3.8 g/dL (ref 3.5–5.0)
Alkaline Phosphatase: 59 U/L (ref 38–126)
Anion gap: 8 (ref 5–15)
BUN: 12 mg/dL (ref 6–20)
CO2: 26 mmol/L (ref 22–32)
Calcium: 9.3 mg/dL (ref 8.9–10.3)
Chloride: 102 mmol/L (ref 98–111)
Creatinine: 1.06 mg/dL — ABNORMAL HIGH (ref 0.44–1.00)
GFR, Estimated: >60 mL/min (ref >60)
Glucose, Bld: 140 mg/dL — ABNORMAL HIGH (ref 70–99)
Potassium: 3.7 mmol/L (ref 3.5–5.1)
Sodium: 136 mmol/L (ref 135–145)
Total Bilirubin: 0.8 mg/dL (ref 0.0–1.2)
Total Protein: 7 g/dL (ref 6.5–8.1)

## 2023-10-14 LAB — MAGNESIUM: Magnesium: 1.9 mg/dL (ref 1.7–2.4)

## 2023-10-14 MED ORDER — IOHEXOL 300 MG/ML  SOLN
80.0000 mL | Freq: Once | INTRAMUSCULAR | Status: AC | PRN
  Administered 2023-10-14: 80 mL via INTRAVENOUS

## 2023-10-14 MED ORDER — ONDANSETRON HCL 4 MG/2ML IJ SOLN
8.0000 mg | Freq: Once | INTRAMUSCULAR | Status: AC
  Administered 2023-10-14: 8 mg via INTRAVENOUS
  Filled 2023-10-14: qty 4

## 2023-10-14 MED ORDER — SODIUM CHLORIDE 0.9 % IV SOLN: Freq: Once | INTRAVENOUS | Status: AC

## 2023-10-14 NOTE — Progress Notes (Signed)
 Pt schuduled for STAT CT today at 5:00 PM at [facility]. This RN received v/o from Mallie ORN PA-C to ;eave Pt accessed with PIV upon discharge d/t evening scan appt. This RN saline-locked and secured Pt's PIV with coban prior to d/c from [facility]

## 2023-10-14 NOTE — Progress Notes (Signed)
 Symptom Management Consult Note  Cancer Center    Patient Care Team: Nedra Tinnie LABOR, NP as PCP - General (Internal Medicine) Odean Potts, MD as Consulting Physician (Hematology and Oncology) Ebbie Cough, MD as Consulting Physician (General Surgery) Dewey Rush, MD as Consulting Physician (Radiation Oncology)    Name / MRN / DOB: Lynn [name]  [id number]  1985/08/27   Date of visit: 10/14/2023   Chief Complaint/Reason for visit: nausea, vomiting, diarrhea   Current Therapy: Verzenio     ASSESSMENT AND PLAN Patient is a 38 y.o. female with oncologic history of malignant neoplasm of overlapping sites of left breast in female, estrogen receptor positive followed by Dr. Odean.  I have viewed most recent oncology note and lab work.  #Malignant neoplasm of overlapping sites of left breast in female, estrogen receptor positive  - Next appointment with oncologist is 10/28/23 - Per last oncology note Verzenio  increased 150 mg PO BID on 08/12/23   #Gastroenteritis - Nausea, vomiting and diarrhea x 2 weeks with acute abdominal pain x 3 days. Differential includes bacterial infection, appendicitis, colitis, or Verzenio  AE, ?possible shared infection with dog. Abdominal exam without peritoneal signs. - Order stool testing for bacterial pathogens. Patient unable to provide sample in clinic. She will collect sample at home and bring in tomorrow. Advise against loperamide until stool test results are available. - Patient received IV zofran  and 1L NS in clinic for hydration support and symptom management.   - Order STAT CT AP to evaluate abdominal pain and persistent symptoms. Patient wants to attempt outpatient symptom management for now. Encouraged to take antiemetic routinely until symptoms are better controlled. We will have her RTC in 2 days for recheck and more IVF if needed. She will hold Verzenio  for now.  #Pancytopenia -White blood cell count low at 3.5,  consistent with previous results.  - Hemoglobin decreased from 9.5 to 9.0, indicating mild anemia. Likely related to Verzenio . Denies any active bleeding. No indication for transfusion today. -Oncologist has been closely monitoring counts.   Strict ED precautions discussed should symptoms worsen. Red flag symptoms discussed at length with patient and spouse.   HEME/ONC HISTORY Oncology History  Malignant neoplasm of overlapping sites of left breast in female, estrogen receptor positive (HCC)  05/29/2022 Initial Diagnosis   Palpable abnormalities in the left breast led to mammograms which revealed 3 masses 0.7 cm at 1 o'clock position, 3 cm at 2 o'clock position, additional nodule 8 mm total span 5 cm, 5 abnormal axillary lymph nodes biopsy of the masses and the lymph node were positive for grade 3 IDC with high-grade DCIS with LVI, ER 95%, PR 40%, Ki-67 60%, HER2 2+ by IHC FISH negative   06/15/2022 Cancer Staging   Staging form: Breast, AJCC 8th Edition - Clinical: Stage IIIB (cT3, cN3, cM0, G3, ER+, PR+, HER2-) - Signed by Crawford Morna Pickle, NP on 06/15/2022 Stage prefix: Initial diagnosis Histologic grading system: 3 grade system   06/15/2022 Breast MRI   IMPRESSION: 1. Multiple sites of biopsy proven malignancy with in the lateral left breast. The overall conglomerate of suspicious findings spans approximately 4.4 x 3.5 x 3.5 cm, and contains all 3 post biopsy marking clips. 2. 10+ morphologically abnormal level I, II, III and Rotter's lymph nodes within the left axilla. 3. Diffuse skin and trabecular thickening on the left, consistent with lymphovascular spread of disease. 4. No MRI evidence of malignancy on the right.   06/20/2022 - 10/09/2022 Chemotherapy   Patient is  on Treatment Plan : BREAST ADJUVANT DOSE DENSE AC q14d / PACLitaxel  q7d      Genetic Testing   [facility] Multi-Cancer Panel+RNA was Negative. Report date is 06/13/2022.  The Multi-Cancer + RNA Panel offered by  [employer] includes sequencing and/or deletion/duplication analysis of the following 70 genes:  AIP*, ALK, APC*, ATM*, AXIN2*, BAP1*, BARD1*, BLM*, BMPR1A*, BRCA1*, BRCA2*, BRIP1*, CDC73*, CDH1*, CDK4, CDKN1B*, CDKN2A, CHEK2*, CTNNA1*, DICER1*, EPCAM (del/dup only), EGFR, FH*, FLCN*, GREM1 (promoter dup only), HOXB13, KIT, LZTR1, MAX*, MBD4, MEN1*, MET, MITF, MLH1*, MSH2*, MSH3*, MSH6*, MUTYH*, NF1*, NF2*, NTHL1*, PALB2*, PDGFRA, PMS2*, POLD1*, POLE*, POT1*, PRKAR1A*, PTCH1*, PTEN*, RAD51C*, RAD51D*, RB1*, RET, SDHA* (sequencing only), SDHAF2*, SDHB*, SDHC*, SDHD*, SMAD4*, SMARCA4*, SMARCB1*, SMARCE1*, STK11*, SUFU*, TMEM127*, TP53*, TSC1*, TSC2*, VHL*. RNA analysis is performed for * genes.   11/20/2022 Surgery   A. BREAST, LEFT, LUMPECTOMY:  Fibrosis and slight inflammation consistent with treatment effects  No residual invasive or in situ carcinoma (ypT0)  Margins: All margins negative for carcinoma  Lymphovascular space invasion: Not identified  Biopsy site and biopsy clip  See oncology table   14 LN biopsied negative for cancer.    01/22/2023 - 03/21/2023 Radiation Therapy   Plan Name: Breast_L_BH Site: Breast, Left Technique: 3D Mode: Photon Dose Per Fraction: 1.8 Gy Prescribed Dose (Delivered / Prescribed): 50.4 Gy / 50.4 Gy Prescribed Fxs (Delivered / Prescribed): 28 / 28   Plan Name: Brst_L_SCV_BH Site: Sclav-LT Technique: 3D Mode: Photon Dose Per Fraction: 1.8 Gy Prescribed Dose (Delivered / Prescribed): 50.4 Gy / 50.4 Gy Prescribed Fxs (Delivered / Prescribed): 28 / 28   Plan Name: Brst_L_BH_Bst Site: Breast, Left Technique: 3D Mode: Photon Dose Per Fraction: 2 Gy Prescribed Dose (Delivered / Prescribed): 10 Gy / 10 Gy Prescribed Fxs (Delivered / Prescribed): 5 / 5   04/2023 -  Anti-estrogen oral therapy   Anastrozole        INTERVAL HISTORY  Discussed the use of AI scribe software for clinical note transcription with the patient, who gave verbal consent to proceed.     Lynn [name] is a 38 y.o. female with oncologic history as above presenting to [facility] today with chief complaint of nausea, vomiting, and diarrhea. Accompanied to clinic today by spouse who provides additional history.  She has been experiencing gastrointestinal symptoms since July 1st, including vomiting and diarrhea. Initial improvement was noted by July 3rd, but symptoms relapsed on July 4th. Despite using over-the-counter antidiarrheals like Imodium, she had another vomiting episode on July 5th. By July 6th, she was symptom-free, but symptoms returned on July 7th, persisting with varying intensity. Severe episodes occurred on July 11th and 12th, with 'sulfur burps', vomiting, and diarrhea. On July 13th, she managed to eat some crackers and oatmeal but experienced severe symptoms after consuming noodles and chicken. Vomiting of previously consumed food was noted on July 14th. Diarrhea is described as yellow and liquid, and vomiting is projectile and sudden.  She has experienced significant unintentional weight loss from 340 pounds to 324 pounds. Abdominal pain is severe, particularly to the right of the umbilicus, described as throbbing and sharp, becoming stabbing before vomiting. The pain has been present since symptom onset but worsened over the past weekend.  She is on Verzenio  for cancer treatment, with a dose increase in May and initiation of the new dose in June. She missed doses on July 12th and 13th due to vomiting. Imodium was used to manage diarrhea, with maximum dosage reached on July 12th without complete relief. No Imodium was taken  on July 14th as diarrhea had not occurred yet.  Her dog was also sick with vomiting and suspected ileus, leading to euthanasia on July 7th. No stool testing was performed. The dog had gastrointestinal sensitivity and a history of hookworms.  No recent antibiotic use or travel, except for a trip to [place] in June. No fevers, but chills and sweats are  present. She has a history of a C-section but no other abdominal surgeries. No vaginal bleeding or discharge.     ROS  All other systems are reviewed and are negative for acute change except as noted in the HPI.    Allergies  Allergen Reactions   Macrobid [Nitrofurantoin Macrocrystal] Shortness Of Breath and Nausea And Vomiting   Ibuprofen Hives     Past Medical History:  Diagnosis Date   Anemia    Breast cancer (HCC) 02/28   Cancer (HCC) 2024   L breast   Carpal tunnel syndrome, left    Depression    Diabetes (HCC)    Dysrhythmia    Palpitations   GERD (gastroesophageal reflux disease)    Hypertension    Iron deficiency    Panic attacks    PCOS (polycystic ovarian syndrome)    Personal history of chemotherapy    Pneumonia    Port-A-Cath in place 07/31/2022   Tachycardia    Umbilical hernia      Past Surgical History:  Procedure Laterality Date   ADENOIDECTOMY     As a child   AXILLARY LYMPH NODE DISSECTION Left 11/20/2022   Procedure: LEFT AXILLARY DISSECTION;  Surgeon: Ebbie Cough, MD;  Location: [facility] OR;  Service: General;  Laterality: Left;   BREAST BIOPSY Left 05/29/2022   US  LT BREAST BX W LOC DEV 1ST LESION IMG BX SPEC US  GUIDE 05/29/2022 [facility]   BREAST BIOPSY Left 05/29/2022   US  LT BREAST BX W LOC DEV EA ADD LESION IMG BX SPEC US  GUIDE 05/29/2022 [facility]   BREAST BIOPSY Left 06/06/2022   MM LT BREAST BX W LOC DEV 1ST LESION IMAGE BX SPEC STEREO GUIDE 06/06/2022 [facility]   BREAST BIOPSY  11/19/2022   MM LT RADIOACTIVE SEED LOC MAMMO GUIDE 11/19/2022 [facility]   BREAST BIOPSY  11/19/2022   MM LT RADIOACTIVE SEED EA ADD LESION LOC MAMMO GUIDE 11/19/2022 [facility]   BREAST BIOPSY  11/19/2022   MM LT RADIOACTIVE SEED EA ADD LESION LOC MAMMO GUIDE 11/19/2022 [facility]   BREAST LUMPECTOMY WITH RADIOACTIVE SEED AND AXILLARY LYMPH NODE DISSECTION Left 11/20/2022   Procedure: LEFT BREAST LUMPECTOMY WITH  RADIOACTIVE SEED;  Surgeon: Ebbie Cough, MD;  Location[facility] OR;  Service: General;  Laterality: Left;  PEC BLOCK   CESAREAN SECTION  2015   PORT-A-CATH REMOVAL Right 11/20/2022   Procedure: REMOVAL PORT-A-CATH;  Surgeon: Ebbie Cough, MD;  Location: [facility] OR;  Service: General;  Laterality: Right;   PORTACATH PLACEMENT N/A 06/19/2022   Procedure: INSERTION PORT-A-CATH;  Surgeon: Ebbie Cough, MD;  Location: [facility] OR;  Service: General;  Laterality: N/A;   TONSILLECTOMY     As a child    Social History   Socioeconomic History   Marital status: Married    Spouse name: Not on file   Number of children: 1   Years of education: some college   Highest education level: Associate degree: occupational, Scientist, product/process development, or vocational program  Occupational History   Not on file  Tobacco Use   Smoking status: Former    Current packs/day: 0.00  Average packs/day: 1 pack/day for 7.0 years (7.0 ttl pk-yrs)    Types: Cigarettes, E-cigarettes    Start date: 03/20/2007    Quit date: 10/29/2021    Years since quitting: 1.9   Smokeless tobacco: Never   Tobacco comments:    Smoked cigarettes for 7 years, quit in 2015. Started vaping 2020 and just quit in July this year.  Vaping Use   Vaping status: Former   Quit date: 04/03/2019  Substance and Sexual Activity   Alcohol use: Not Currently    Comment: Only on special occasions   Drug use: Yes    Frequency: 1.0 times per week    Types: Marijuana   Sexual activity: Yes    Birth control/protection: Condom    Comment: Also on Zoladex  implant for [facility]  Other Topics Concern   Not on file  Social History Narrative      Social Drivers of Health   Financial Resource Strain: Medium Risk (10/08/2023)   Overall Financial Resource Strain (CARDIA)    Difficulty of Paying Living Expenses: Somewhat hard  Food Insecurity: Food Insecurity Present (10/08/2023)   Hunger Vital Sign    Worried About Running Out of Food in the Last Year: Sometimes true     Ran Out of Food in the Last Year: Sometimes true  Transportation Needs: No Transportation Needs (10/08/2023)   PRAPARE - Administrator, Civil Service (Medical): No    Lack of Transportation (Non-Medical): No  Physical Activity: Insufficiently Active (10/08/2023)   Exercise Vital Sign    Days of Exercise per Week: 1 day    Minutes of Exercise per Session: 10 min  Stress: Stress Concern Present (10/08/2023)   [employer] of Occupational Health - Occupational Stress Questionnaire    Feeling of Stress: To some extent  Social Connections: Socially Integrated (10/08/2023)   Social Connection and Isolation Panel    Frequency of Communication with Friends and Family: Twice a week    Frequency of Social Gatherings with Friends and Family: Once a week    Attends Religious Services: More than 4 times per year    Active Member of [employer] or Organizations: Yes    Attends Engineer, structural: More than 4 times per year    Marital Status: Married  Catering manager Violence: Not At Risk (11/20/2022)   Humiliation, Afraid, Rape, and Kick questionnaire    Fear of Current or Ex-Partner: No    Emotionally Abused: No    Physically Abused: No    Sexually Abused: No    Family History  Problem Relation Age of Onset   Heart disease Mother    Diabetes Mother    Hypertension Mother    Kidney disease Mother    Heart failure Mother    Heart attack Mother 33   Arthritis Mother    Asthma Mother    COPD Mother    Depression Mother    Hyperlipidemia Mother    Mental illness Mother    Bladder Cancer Mother    Anemia Mother    Anxiety disorder Mother    Early death Mother    Obesity Mother    Stroke Mother    Varicose Veins Mother    Heart disease Father    Heart attack Father 67   COPD Father    Hyperlipidemia Father    Hypertension Father    Mental illness Father    Anxiety disorder Father    Early death Father    Obesity Father  Varicose Veins Father    Arthritis Sister     Depression Sister    Diabetes Sister    Hypertension Sister    Mental illness Sister    Miscarriages / Stillbirths Sister    Anxiety disorder Sister    Obesity Sister    Varicose Veins Sister    Alcohol abuse Sister    Depression Sister    Diabetes Sister    Heart disease Sister    Heart attack Sister 28   Arthritis Sister    Kidney disease Sister    Transient ischemic attack Sister    Hypertension Sister    Anxiety disorder Sister    Obesity Sister    Stroke Sister    Bladder Cancer Maternal Aunt    Colon cancer Maternal Aunt 2 - 49   Other Maternal Aunt 51 - 59       brain tumor (unknown if benign or malignant)   Lung cancer Paternal Aunt 5   Melanoma Paternal Aunt 44 - 49   Heart failure Maternal Grandmother    Arthritis Maternal Grandmother    Alcohol abuse Maternal Grandmother    COPD Maternal Grandmother    Diabetes Maternal Grandmother    Hearing loss Maternal Grandmother    Heart disease Maternal Grandmother    Hyperlipidemia Maternal Grandmother    Hypertension Maternal Grandmother    Stroke Maternal Grandmother    Miscarriages / Stillbirths Maternal Grandmother    Mental illness Maternal Grandmother    Heart attack Maternal Grandmother    Obesity Maternal Grandmother    Heart failure Maternal Grandfather    Alcohol abuse Maternal Grandfather    Arthritis Maternal Grandfather    Early death Maternal Grandfather    Heart disease Maternal Grandfather    Hyperlipidemia Maternal Grandfather    Hypertension Maternal Grandfather    Heart attack Maternal Grandfather 60   Mental illness Maternal Grandfather    Heart failure Paternal Grandmother    Arthritis Paternal Grandmother    Diabetes Paternal Grandmother    Heart attack Paternal Grandmother    Miscarriages / Stillbirths Paternal Grandmother    Mental illness Paternal Grandmother    Kidney disease Paternal Grandmother    Intellectual disability Paternal Grandmother    Hypertension Paternal  Grandmother    Hyperlipidemia Paternal Grandmother    Heart disease Paternal Grandmother    Obesity Paternal Grandmother    Heart failure Paternal Grandfather    Arthritis Paternal Grandfather    Heart attack Paternal Grandfather    Mental illness Paternal Grandfather    Hearing loss Paternal Grandfather    Heart disease Paternal Grandfather    Hyperlipidemia Paternal Grandfather    Hypertension Paternal Grandfather    Breast cancer Neg Hx    Prostate cancer Neg Hx    Ovarian cancer Neg Hx    Endometrial cancer Neg Hx    Pancreatic cancer Neg Hx      Current Outpatient Medications:    abemaciclib  (VERZENIO ) 150 MG tablet, Take 1 tablet (150 mg total) by mouth 2 (two) times daily., Disp: 56 tablet, Rfl: 3   ALPRAZolam  (XANAX ) 0.5 MG tablet, Take 1 tablet (0.5 mg total) by mouth at bedtime as needed for anxiety., Disp: 30 tablet, Rfl: 3   aspirin EC 81 MG tablet, Take 81 mg by mouth daily. Swallow whole., Disp: , Rfl:    aspirin-acetaminophen -caffeine (EXCEDRIN MIGRAINE) 250-250-65 MG tablet, Take 1-2 tablets by mouth 2 (two) times daily as needed for headache or migraine., Disp: , Rfl:  Blood Glucose Monitoring Suppl (ACCU-CHEK AVIVA PLUS) w/Device KIT, Use to check blood sugar once daily at 6 (six) AM., Disp: 1 kit, Rfl: 0   carvedilol  (COREG ) 25 MG tablet, Take 1 tablet (25 mg total) by mouth 2 (two) times daily with a meal., Disp: 180 tablet, Rfl: 0   cholecalciferol (VITAMIN D3) 25 MCG (1000 UNIT) tablet, Take 1,000 Units by mouth daily., Disp: , Rfl:    esomeprazole (NEXIUM) 20 MG capsule, Take 20 mg by mouth daily at 12 noon., Disp: , Rfl:    gabapentin  (NEURONTIN ) 100 MG capsule, Take 1 capsule (100 mg total) by mouth 3 (three) times daily., Disp: 90 capsule, Rfl: 3   glucose blood (ACCU-CHEK AVIVA PLUS) test strip, Use as instructed, Disp: 100 each, Rfl: 12   glucose blood (ACCU-CHEK GUIDE TEST) test strip, Use to check blood sugar once a day at 6 am., Disp: 50 strip, Rfl: 0    goserelin (ZOLADEX ) 3.6 MG injection, Inject 3.6 mg into the skin every 28 (twenty-eight) days., Disp: , Rfl:    Lancets (ONETOUCH DELICA PLUS LANCET33G) MISC, Use to check blood sugar once a day at 6 am., Disp: 100 each, Rfl: 0   letrozole  (FEMARA ) 2.5 MG tablet, Take 1 tablet (2.5 mg total) by mouth daily., Disp: 90 tablet, Rfl: 3   lisinopril -hydrochlorothiazide  (ZESTORETIC ) 10-12.5 MG tablet, Take 1 tablet by mouth daily., Disp: 90 tablet, Rfl: 1   ONE TOUCH CLUB LANCETS MISC, 1 each by Does not apply route daily., Disp: 100 each, Rfl: 11   prochlorperazine  (COMPAZINE ) 10 MG tablet, Take 1 tablet (10 mg total) by mouth every 6 (six) hours as needed for nausea or vomiting., Disp: 30 tablet, Rfl: 2   Semaglutide , 2 MG/DOSE, 8 MG/3ML SOPN, Inject 2 mg as directed once a week., Disp: 3 mL, Rfl: 2   Vitamin D , Ergocalciferol , (DRISDOL ) 1.25 MG (50000 UNIT) CAPS capsule, Take 1 capsule (50,000 Units total) by mouth every 7 (seven) days., Disp: 12 capsule, Rfl: 0  PHYSICAL EXAM ECOG FS:1 - Symptomatic but completely ambulatory    Vitals:   10/14/23 1320 10/14/23 1554  BP: (!) 145/89 (!) 145/88  Pulse: (!) 106 96  Resp: 15 16  Temp: (!) 97.3 F (36.3 C)   TempSrc: Temporal   SpO2: 97% 100%  Weight: (!) 324 lb 12.8 oz (147.3 kg)    Physical Exam Vitals and nursing note reviewed.  Constitutional:      Appearance: She is not ill-appearing or toxic-appearing.  HENT:     Head: Normocephalic.     Mouth/Throat:     Mouth: Mucous membranes are dry.  Eyes:     Conjunctiva/sclera: Conjunctivae normal.  Cardiovascular:     Rate and Rhythm: Regular rhythm. Tachycardia present.     Pulses: Normal pulses.     Heart sounds: Normal heart sounds.  Pulmonary:     Effort: Pulmonary effort is normal.     Breath sounds: Normal breath sounds.  Abdominal:     General: There is no distension.     Tenderness: There is generalized abdominal tenderness. Negative signs include McBurney's sign.   Musculoskeletal:     Cervical back: Normal range of motion.  Skin:    General: Skin is warm and dry.  Neurological:     Mental Status: She is alert.        LABORATORY DATA I have reviewed the data as listed    Latest Ref Rng & Units 10/14/2023    1:36 PM 09/26/2023  2:19 PM 08/29/2023   12:29 PM  CBC  WBC 4.0 - 10.5 K/uL 3.5  3.0  4.7   Hemoglobin 12.0 - 15.0 g/dL 9.0  9.5  88.8   Hematocrit 36.0 - 46.0 % 24.8  26.4  31.4   Platelets 150 - 400 K/uL 165  155  172         Latest Ref Rng & Units 10/14/2023    1:36 PM 09/26/2023    2:19 PM 08/29/2023   12:29 PM  CMP  Glucose 70 - 99 mg/dL 859  822  805   BUN 6 - 20 mg/dL 12  17  14    Creatinine 0.44 - 1.00 mg/dL 8.93  9.10  9.04   Sodium 135 - 145 mmol/L 136  136  134   Potassium 3.5 - 5.1 mmol/L 3.7  3.8  4.3   Chloride 98 - 111 mmol/L 102  102  98   CO2 22 - 32 mmol/L 26  27  30    Calcium 8.9 - 10.3 mg/dL 9.3  9.2  9.4   Total Protein 6.5 - 8.1 g/dL 7.0  6.9  7.2   Total Bilirubin 0.0 - 1.2 mg/dL 0.8  0.5  0.6   Alkaline Phos 38 - 126 U/L 59  58  71   AST 15 - 41 U/L 27  23  25    ALT 0 - 44 U/L 22  27  28         RADIOGRAPHIC STUDIES (from last 24 hours if applicable) I have personally reviewed the radiological images as listed and agreed with the findings in the report. No results found.      Visit Diagnosis: 1. Right lower quadrant abdominal pain   2. Nausea and vomiting, unspecified vomiting type   3. Diarrhea, unspecified type   4. Other pancytopenia (HCC)      Orders Placed This Encounter  Procedures   C Difficile Quick Screen w PCR reflex    Standing Status:   Future    Expected Date:   10/15/2023    Expiration Date:   01/13/2024   Gastrointestinal Panel by PCR , Stool    Standing Status:   Future    Expected Date:   10/15/2023    Expiration Date:   01/13/2024   CT ABDOMEN PELVIS W CONTRAST    Standing Status:   Future    Number of Occurrences:   1    Expected Date:   10/15/2023     Expiration Date:   10/13/2024    If indicated for the ordered procedure, I authorize the administration of contrast media per Radiology protocol:   Yes    Does the patient have a contrast media/X-ray dye allergy?:   No    Is patient pregnant?:   No    Preferred imaging location?:   [facility]    If indicated for the ordered procedure, I authorize the administration of oral contrast media per Radiology protocol:   Yes    All questions were answered. The patient knows to call the clinic with any problems, questions or concerns. No barriers to learning was detected.  A total of more than 30 minutes were spent on this encounter with face-to-face time and non-face-to-face time, including preparing to see the patient, ordering tests and/or medications, counseling the patient and coordination of care as outlined above.    Thank you for allowing me to participate in the care of this patient.    Tiziana Cislo E  Walisiewicz,  PA-C Department of Hematology/Oncology [facility] Phone: [phone]  [phone]    10/14/2023 4:38 PM

## 2023-10-14 NOTE — Telephone Encounter (Signed)
 This RN called Ms.Comacho in regards to VM left. Pt expressed having new onset diarrhea and vomiting with abd pain 7/10. This RN offered [facility] appt today at 1:00 PM. Pt agreeable with appt and verbalized understanding.

## 2023-10-14 NOTE — Patient Instructions (Signed)
 Nausea and Vomiting, Adult Nausea is feeling that you have an upset stomach and that you are about to vomit. Vomiting is when food in your stomach forcefully comes out of your mouth. Vomiting can make you feel weak. If you vomit, or if you are not able to drink enough fluids, you may not have enough water in your body (get dehydrated). If you do not have enough water in your body, you may: Feel tired. Feel thirsty. Have a dry mouth. Have cracked lips. Pee (urinate) less often. Older adults and people with other diseases or a weak body defense system (immune system) are at higher risk for not having enough water in the body. If you feel like you may vomit or you vomit, it is important to follow instructions from your doctor about how to take care of yourself. Follow these instructions at home: Watch your symptoms for any changes. Tell your doctor about them. Eating and drinking     Take an ORS (oral rehydration solution). This is a drink that is sold at pharmacies and stores. Drink clear fluids in small amounts as you are able, such as: Water. Ice chips. Fruit juice that has water added (diluted fruit juice). Low-calorie sports drinks. Eat bland, easy-to-digest foods in small amounts as you are able, such as: Bananas. Applesauce. Rice. Low-fat (lean) meats. Toast. Crackers. Avoid drinking fluids that have a lot of sugar or caffeine in them. This includes energy drinks, sports drinks, and soda. Avoid alcohol. Avoid spicy or fatty foods. General instructions Take over-the-counter and prescription medicines only as told by your doctor. Drink enough fluid to keep your pee (urine) pale yellow. Wash your hands often with soap and water for at least 20 seconds. If you cannot use soap and water, use hand sanitizer. Make sure that everyone in your home washes their hands well and often. Rest at home until you feel better. Watch your condition for any changes. Take slow and deep breaths  when you feel like you may vomit. Keep all follow-up visits. Contact a doctor if: Your symptoms get worse. You have new symptoms. You have a fever. You cannot drink fluids without vomiting. You feel like you may vomit for more than 2 days. You feel light-headed or dizzy. You have a headache. You have muscle cramps. You have a rash. You have pain while peeing. Get help right away if: You have pain in your chest, neck, arm, or jaw. You feel very weak or you faint. You vomit again and again. You have vomit that is bright red or looks like black coffee grounds. You have bloody or black poop (stools) or poop that looks like tar. You have a very bad headache, a stiff neck, or both. You have very bad pain, cramping, or bloating in your belly (abdomen). You have trouble breathing. You are breathing very quickly. Your heart is beating very quickly. Your skin feels cold and clammy. You feel confused. You have signs of losing too much water in your body, such as: Dark pee, very little pee, or no pee. Cracked lips. Dry mouth. Sunken eyes. Sleepiness. Weakness. These symptoms may be an emergency. Get help right away. Call 911. Do not wait to see if the symptoms will go away. Do not drive yourself to the hospital. Summary Nausea is feeling that you have an upset stomach and that you are about to vomit. Vomiting is when food in your stomach comes out of your mouth. Follow instructions from your doctor about eating and drinking.  Take over-the-counter and prescription medicines only as told by your doctor. Contact your doctor if your symptoms get worse or you have new symptoms. Keep all follow-up visits. This information is not intended to replace advice given to you by your health care provider. Make sure you discuss any questions you have with your health care provider. Document Revised: 09/23/2020 Document Reviewed: 09/23/2020 [employer]vier Patient Education  2024 [employer].

## 2023-10-15 ENCOUNTER — Telehealth: Payer: Self-pay

## 2023-10-15 ENCOUNTER — Telehealth: Payer: Self-pay | Admitting: Hematology and Oncology

## 2023-10-15 ENCOUNTER — Inpatient Hospital Stay

## 2023-10-15 NOTE — Telephone Encounter (Signed)
 Left message on My Chart with Ca Score results per Dr. Vanetta Shawl note. Routed to PCP.

## 2023-10-15 NOTE — Telephone Encounter (Signed)
 This RN called Mrs.Schow to inquire about stool sample. Patient did not answer. This RN left VM with reminder about [facility] appt tomorrow and provided call back number.

## 2023-10-15 NOTE — Telephone Encounter (Signed)
 Patient aware of upcoming appointment changes

## 2023-10-16 ENCOUNTER — Other Ambulatory Visit (HOSPITAL_COMMUNITY): Payer: Self-pay

## 2023-10-16 ENCOUNTER — Telehealth: Payer: Self-pay | Admitting: *Deleted

## 2023-10-16 ENCOUNTER — Encounter (INDEPENDENT_AMBULATORY_CARE_PROVIDER_SITE_OTHER): Payer: Self-pay

## 2023-10-16 ENCOUNTER — Other Ambulatory Visit: Payer: Self-pay

## 2023-10-16 ENCOUNTER — Inpatient Hospital Stay

## 2023-10-16 ENCOUNTER — Inpatient Hospital Stay (HOSPITAL_BASED_OUTPATIENT_CLINIC_OR_DEPARTMENT_OTHER): Admitting: Physician Assistant

## 2023-10-16 ENCOUNTER — Ambulatory Visit: Admitting: Physical Therapy

## 2023-10-16 VITALS — BP 110/68 | HR 94 | Temp 97.5°F | Resp 16 | Wt 329.5 lb

## 2023-10-16 DIAGNOSIS — Z1732 Human epidermal growth factor receptor 2 negative status: Secondary | ICD-10-CM | POA: Diagnosis not present

## 2023-10-16 DIAGNOSIS — R197 Diarrhea, unspecified: Secondary | ICD-10-CM | POA: Diagnosis not present

## 2023-10-16 DIAGNOSIS — T451X5A Adverse effect of antineoplastic and immunosuppressive drugs, initial encounter: Secondary | ICD-10-CM | POA: Diagnosis not present

## 2023-10-16 DIAGNOSIS — Z87891 Personal history of nicotine dependence: Secondary | ICD-10-CM | POA: Diagnosis not present

## 2023-10-16 DIAGNOSIS — C50812 Malignant neoplasm of overlapping sites of left female breast: Secondary | ICD-10-CM

## 2023-10-16 DIAGNOSIS — Z7982 Long term (current) use of aspirin: Secondary | ICD-10-CM | POA: Diagnosis not present

## 2023-10-16 DIAGNOSIS — Z87440 Personal history of urinary (tract) infections: Secondary | ICD-10-CM | POA: Diagnosis not present

## 2023-10-16 DIAGNOSIS — Z170 Estrogen receptor positive status [ER+]: Secondary | ICD-10-CM

## 2023-10-16 DIAGNOSIS — K219 Gastro-esophageal reflux disease without esophagitis: Secondary | ICD-10-CM | POA: Diagnosis not present

## 2023-10-16 DIAGNOSIS — N390 Urinary tract infection, site not specified: Secondary | ICD-10-CM

## 2023-10-16 DIAGNOSIS — I10 Essential (primary) hypertension: Secondary | ICD-10-CM | POA: Diagnosis not present

## 2023-10-16 DIAGNOSIS — D6481 Anemia due to antineoplastic chemotherapy: Secondary | ICD-10-CM | POA: Diagnosis not present

## 2023-10-16 DIAGNOSIS — Z79811 Long term (current) use of aromatase inhibitors: Secondary | ICD-10-CM | POA: Diagnosis not present

## 2023-10-16 DIAGNOSIS — Z79899 Other long term (current) drug therapy: Secondary | ICD-10-CM | POA: Diagnosis not present

## 2023-10-16 DIAGNOSIS — D61818 Other pancytopenia: Secondary | ICD-10-CM | POA: Diagnosis not present

## 2023-10-16 DIAGNOSIS — Z5111 Encounter for antineoplastic chemotherapy: Secondary | ICD-10-CM | POA: Diagnosis not present

## 2023-10-16 DIAGNOSIS — Z7985 Long-term (current) use of injectable non-insulin antidiabetic drugs: Secondary | ICD-10-CM | POA: Diagnosis not present

## 2023-10-16 DIAGNOSIS — K449 Diaphragmatic hernia without obstruction or gangrene: Secondary | ICD-10-CM | POA: Diagnosis not present

## 2023-10-16 DIAGNOSIS — Z8052 Family history of malignant neoplasm of bladder: Secondary | ICD-10-CM | POA: Diagnosis not present

## 2023-10-16 DIAGNOSIS — K529 Noninfective gastroenteritis and colitis, unspecified: Secondary | ICD-10-CM | POA: Diagnosis not present

## 2023-10-16 DIAGNOSIS — R634 Abnormal weight loss: Secondary | ICD-10-CM | POA: Diagnosis not present

## 2023-10-16 DIAGNOSIS — E282 Polycystic ovarian syndrome: Secondary | ICD-10-CM | POA: Diagnosis not present

## 2023-10-16 DIAGNOSIS — Z1721 Progesterone receptor positive status: Secondary | ICD-10-CM | POA: Diagnosis not present

## 2023-10-16 DIAGNOSIS — R300 Dysuria: Secondary | ICD-10-CM | POA: Diagnosis not present

## 2023-10-16 LAB — URINALYSIS, COMPLETE (UACMP) WITH MICROSCOPIC
Bacteria, UA: NONE SEEN
Bilirubin Urine: NEGATIVE
Glucose, UA: NEGATIVE mg/dL
Hgb urine dipstick: NEGATIVE
Ketones, ur: NEGATIVE mg/dL
Leukocytes,Ua: NEGATIVE
Nitrite: NEGATIVE
Protein, ur: NEGATIVE mg/dL
Specific Gravity, Urine: 1.023 (ref 1.005–1.030)
pH: 5 (ref 5.0–8.0)

## 2023-10-16 MED ORDER — SODIUM CHLORIDE 0.9 % IV SOLN: Freq: Once | INTRAVENOUS | Status: AC

## 2023-10-16 NOTE — Telephone Encounter (Signed)
 Per MD request RN placed call to pt with recent [employer] results being negative.  Pt educated and verbalized understanding.

## 2023-10-16 NOTE — Patient Instructions (Signed)

## 2023-10-16 NOTE — Progress Notes (Signed)
 Symptom Management Consult Note [facility]    Patient Care Team: Nedra Tinnie LABOR, NP as PCP - General (Internal Medicine) Odean Potts, MD as Consulting Physician (Hematology and Oncology) Ebbie Cough, MD as Consulting Physician (General Surgery) Dewey Rush, MD as Consulting Physician (Radiation Oncology)    Name / MRN / DOB: Lynn [name]  [id number]  [birth year]   Date of visit: 10/16/2023   Chief Complaint/Reason for visit: diarrhea   Current Therapy: Verzenio  and Letrozole     ASSESSMENT AND PLAN Patient is a 38 y.o. female with oncologic history of Malignant neoplasm of overlapping sites of left breast in female, estrogen receptor positive followed by Dr. Odean.  I have viewed most recent oncology note and lab work.  #Malignant neoplasm of overlapping sites of left breast in female, estrogen receptor positive  - Patient wil continue to hold Verzenio  until next MD appointment. Anticipate her resuming lower dose however will defer to MD. - Next appointment with oncologist is 10/28/23  #Diarrhea - Resolved. Not able to collect stool sample at previous visit for testing. - Abdomen today is non tender. Benign abdominal exam. Reviewed CT AP results with patient. - Patient received 500 ml NS in clinic today for hydration support as creatinine was slightly elevated last appointment. She will continue to increase fluid intake at home  - Discussed how to manage constipation should that occur.  #Dysuria - x 1 day. History of UTI.  - UA checked as she is immunocompromised and is negative for infection. No indication for antibiotics.  Strict ED precautions discussed should symptoms worsen.   HEME/ONC HISTORY Oncology History  Malignant neoplasm of overlapping sites of left breast in female, estrogen receptor positive (HCC)  05/29/2022 Initial Diagnosis   Palpable abnormalities in the left breast led to mammograms which revealed 3 masses 0.7 cm at 1  o'clock position, 3 cm at 2 o'clock position, additional nodule 8 mm total span 5 cm, 5 abnormal axillary lymph nodes biopsy of the masses and the lymph node were positive for grade 3 IDC with high-grade DCIS with LVI, ER 95%, PR 40%, Ki-67 60%, HER2 2+ by IHC FISH negative   06/15/2022 Cancer Staging   Staging form: Breast, AJCC 8th Edition - Clinical: Stage IIIB (cT3, cN3, cM0, G3, ER+, PR+, HER2-) - Signed by Crawford Morna Pickle, NP on 06/15/2022 Stage prefix: Initial diagnosis Histologic grading system: 3 grade system   06/15/2022 Breast MRI   IMPRESSION: 1. Multiple sites of biopsy proven malignancy with in the lateral left breast. The overall conglomerate of suspicious findings spans approximately 4.4 x 3.5 x 3.5 cm, and contains all 3 post biopsy marking clips. 2. 10+ morphologically abnormal level I, II, III and Rotter's lymph nodes within the left axilla. 3. Diffuse skin and trabecular thickening on the left, consistent with lymphovascular spread of disease. 4. No MRI evidence of malignancy on the right.   06/20/2022 - 10/09/2022 Chemotherapy   Patient is on Treatment Plan : BREAST ADJUVANT DOSE DENSE AC q14d / PACLitaxel  q7d      Genetic Testing   [facility] Multi-Cancer Panel+RNA was Negative. Report date is 06/13/2022.  The Multi-Cancer + RNA Panel offered by [employer] includes sequencing and/or deletion/duplication analysis of the following 70 genes:  AIP*, ALK, APC*, ATM*, AXIN2*, BAP1*, BARD1*, BLM*, BMPR1A*, BRCA1*, BRCA2*, BRIP1*, CDC73*, CDH1*, CDK4, CDKN1B*, CDKN2A, CHEK2*, CTNNA1*, DICER1*, EPCAM (del/dup only), EGFR, FH*, FLCN*, GREM1 (promoter dup only), HOXB13, KIT, LZTR1, MAX*, MBD4, MEN1*, MET, MITF, MLH1*, MSH2*, MSH3*,  MSH6*, MUTYH*, NF1*, NF2*, NTHL1*, PALB2*, PDGFRA, PMS2*, POLD1*, POLE*, POT1*, PRKAR1A*, PTCH1*, PTEN*, RAD51C*, RAD51D*, RB1*, RET, SDHA* (sequencing only), SDHAF2*, SDHB*, SDHC*, SDHD*, SMAD4*, SMARCA4*, SMARCB1*, SMARCE1*, STK11*, SUFU*, TMEM127*,  TP53*, TSC1*, TSC2*, VHL*. RNA analysis is performed for * genes.   11/20/2022 Surgery   A. BREAST, LEFT, LUMPECTOMY:  Fibrosis and slight inflammation consistent with treatment effects  No residual invasive or in situ carcinoma (ypT0)  Margins: All margins negative for carcinoma  Lymphovascular space invasion: Not identified  Biopsy site and biopsy clip  See oncology table   14 LN biopsied negative for cancer.    01/22/2023 - 03/21/2023 Radiation Therapy   Plan Name: Breast_L_BH Site: Breast, Left Technique: 3D Mode: Photon Dose Per Fraction: 1.8 Gy Prescribed Dose (Delivered / Prescribed): 50.4 Gy / 50.4 Gy Prescribed Fxs (Delivered / Prescribed): 28 / 28   Plan Name: Brst_L_SCV_BH Site: Sclav-LT Technique: 3D Mode: Photon Dose Per Fraction: 1.8 Gy Prescribed Dose (Delivered / Prescribed): 50.4 Gy / 50.4 Gy Prescribed Fxs (Delivered / Prescribed): 28 / 28   Plan Name: Brst_L_BH_Bst Site: Breast, Left Technique: 3D Mode: Photon Dose Per Fraction: 2 Gy Prescribed Dose (Delivered / Prescribed): 10 Gy / 10 Gy Prescribed Fxs (Delivered / Prescribed): 5 / 5   04/2023 -  Anti-estrogen oral therapy   Anastrozole        INTERVAL HISTORY  Discussed the use of AI scribe software for clinical note transcription with the patient, who gave verbal consent to proceed.    Lynn [name] is a 38 y.o. female with oncologic history as above presenting to [facility] today with chief complaint of diarrhea. Patient presents unaccompanied to visit today.  She has been experiencing abdominal pain, initially described as a generalized stomachache last night, with previous episodes being more localized. Currently, the pain is more of a soreness in the abdominal area. Since her visit on Monday she has not had any episodes of diarrhea.  She has been able to eat, consuming oatmeal in the morning and ground chicken tacos in the evening and has kept the food down. She denies nausea. She has been holding  the Verzenio . She reports a burning sensation during urination, raising concerns about a possible urinary tract infection (UTI). No issues with urination except for the burning sensation. This has happened after CT scans with contrast in the past, so unsure if that is the cause. She plans to travel to [place]  for her sister's birthday celebration and intends to stay at her best friend's house, being cautious about her health during the trip, especially regarding hydration and managing constipation. Denies fever.    ROS  All other systems are reviewed and are negative for acute change except as noted in the HPI.    Allergies  Allergen Reactions   Macrobid [Nitrofurantoin Macrocrystal] Shortness Of Breath and Nausea And Vomiting   Ibuprofen Hives     Past Medical History:  Diagnosis Date   Anemia    Breast cancer (HCC) 02/28   Cancer (HCC) 2024   L breast   Carpal tunnel syndrome, left    Depression    Diabetes (HCC)    Dysrhythmia    Palpitations   GERD (gastroesophageal reflux disease)    Hypertension    Iron deficiency    Panic attacks    PCOS (polycystic ovarian syndrome)    Personal history of chemotherapy    Pneumonia    Port-A-Cath in place 07/31/2022   Tachycardia    Umbilical hernia  Past Surgical History:  Procedure Laterality Date   ADENOIDECTOMY     As a child   AXILLARY LYMPH NODE DISSECTION Left 11/20/2022   Procedure: LEFT AXILLARY DISSECTION;  Surgeon: Ebbie Cough, MD;  Location: [facility] OR;  Service: General;  Laterality: Left;   BREAST BIOPSY Left 05/29/2022   US  LT BREAST BX W LOC DEV 1ST LESION IMG BX SPEC US  GUIDE 05/29/2022 [facility]   BREAST BIOPSY Left 05/29/2022   US  LT BREAST BX W LOC DEV EA ADD LESION IMG BX SPEC US  GUIDE 05/29/2022 [facility]   BREAST BIOPSY Left 06/06/2022   MM LT BREAST BX W LOC DEV 1ST LESION IMAGE BX SPEC STEREO GUIDE 06/06/2022 [facility]   BREAST BIOPSY  11/19/2022   MM LT RADIOACTIVE SEED  LOC MAMMO GUIDE 11/19/2022 [facility]   BREAST BIOPSY  11/19/2022   MM LT RADIOACTIVE SEED EA ADD LESION LOC MAMMO GUIDE 11/19/2022 [facility]   BREAST BIOPSY  11/19/2022   MM LT RADIOACTIVE SEED EA ADD LESION LOC MAMMO GUIDE 11/19/2022 [facility]   BREAST LUMPECTOMY WITH RADIOACTIVE SEED AND AXILLARY LYMPH NODE DISSECTION Left 11/20/2022   Procedure: LEFT BREAST LUMPECTOMY WITH RADIOACTIVE SEED;  Surgeon: Ebbie Cough, MD;  Location: [facility] OR;  Service: General;  Laterality: Left;  PEC BLOCK   CESAREAN SECTION  2015   PORT-A-CATH REMOVAL Right 11/20/2022   Procedure: REMOVAL PORT-A-CATH;  Surgeon: Ebbie Cough, MD;  Location: [facility] OR;  Service: General;  Laterality: Right;   PORTACATH PLACEMENT Lynn [name] 06/19/2022   Procedure: INSERTION PORT-A-CATH;  Surgeon: Ebbie Cough, MD;  Location: [facility] OR;  Service: General;  Laterality: Lynn [name];   TONSILLECTOMY     As a child    Social History   Socioeconomic History   Marital status: Married    Spouse name: Not on file   Number of children: 1   Years of education: some college   Highest education level: Associate degree: occupational, Scientist, product/process development, or vocational program  Occupational History   Not on file  Tobacco Use   Smoking status: Former    Current packs/day: 0.00    Average packs/day: 1 pack/day for 7.0 years (7.0 ttl pk-yrs)    Types: Cigarettes, E-cigarettes    Start date: 03/20/2007    Quit date: 10/29/2021    Years since quitting: 1.9   Smokeless tobacco: Never   Tobacco comments:    Smoked cigarettes for 7 years, quit in 2015. Started vaping 2020 and just quit in July this year.  Vaping Use   Vaping status: Former   Quit date: 04/03/2019  Substance and Sexual Activity   Alcohol use: Not Currently    Comment: Only on special occasions   Drug use: Yes    Frequency: 1.0 times per week    Types: Marijuana   Sexual activity: Yes    Birth control/protection: Condom    Comment: Also on Zoladex  implant for [facility]   Other Topics Concern   Not on file  Social History Narrative      Social Drivers of Health   Financial Resource Strain: Medium Risk (10/08/2023)   Overall Financial Resource Strain (CARDIA)    Difficulty of Paying Living Expenses: Somewhat hard  Food Insecurity: Food Insecurity Present (10/08/2023)   Hunger Vital Sign    Worried About Running Out of Food in the Last Year: Sometimes true    Ran Out of Food in the Last Year: Sometimes true  Transportation Needs: No Transportation Needs (10/08/2023)   PRAPARE - Transportation  Lack of Transportation (Medical): No    Lack of Transportation (Non-Medical): No  Physical Activity: Insufficiently Active (10/08/2023)   Exercise Vital Sign    Days of Exercise per Week: 1 day    Minutes of Exercise per Session: 10 min  Stress: Stress Concern Present (10/08/2023)   [employer] of Occupational Health - Occupational Stress Questionnaire    Feeling of Stress: To some extent  Social Connections: Socially Integrated (10/08/2023)   Social Connection and Isolation Panel    Frequency of Communication with Friends and Family: Twice a week    Frequency of Social Gatherings with Friends and Family: Once a week    Attends Religious Services: More than 4 times per year    Active Member of [employer] or Organizations: Yes    Attends Banker Meetings: More than 4 times per year    Marital Status: Married  Catering manager Violence: Not At Risk (11/20/2022)   Humiliation, Afraid, Rape, and Kick questionnaire    Fear of Current or Ex-Partner: No    Emotionally Abused: No    Physically Abused: No    Sexually Abused: No    Family History  Problem Relation Age of Onset   Heart disease Mother    Diabetes Mother    Hypertension Mother    Kidney disease Mother    Heart failure Mother    Heart attack Mother 71   Arthritis Mother    Asthma Mother    COPD Mother    Depression Mother    Hyperlipidemia Mother    Mental illness Mother    Bladder  Cancer Mother    Anemia Mother    Anxiety disorder Mother    Early death Mother    Obesity Mother    Stroke Mother    Varicose Veins Mother    Heart disease Father    Heart attack Father 41   COPD Father    Hyperlipidemia Father    Hypertension Father    Mental illness Father    Anxiety disorder Father    Early death Father    Obesity Father    Varicose Veins Father    Arthritis Sister    Depression Sister    Diabetes Sister    Hypertension Sister    Mental illness Sister    Miscarriages / Stillbirths Sister    Anxiety disorder Sister    Obesity Sister    Varicose Veins Sister    Alcohol abuse Sister    Depression Sister    Diabetes Sister    Heart disease Sister    Heart attack Sister 63   Arthritis Sister    Kidney disease Sister    Transient ischemic attack Sister    Hypertension Sister    Anxiety disorder Sister    Obesity Sister    Stroke Sister    Bladder Cancer Maternal Aunt    Colon cancer Maternal Aunt 52 - 28   Other Maternal Aunt 19 - 59       brain tumor (unknown if benign or malignant)   Lung cancer Paternal Aunt 42   Melanoma Paternal Aunt 30 - 49   Heart failure Maternal Grandmother    Arthritis Maternal Grandmother    Alcohol abuse Maternal Grandmother    COPD Maternal Grandmother    Diabetes Maternal Grandmother    Hearing loss Maternal Grandmother    Heart disease Maternal Grandmother    Hyperlipidemia Maternal Grandmother    Hypertension Maternal Grandmother    Stroke Maternal Grandmother  Miscarriages / Stillbirths Maternal Grandmother    Mental illness Maternal Grandmother    Heart attack Maternal Grandmother    Obesity Maternal Grandmother    Heart failure Maternal Grandfather    Alcohol abuse Maternal Grandfather    Arthritis Maternal Grandfather    Early death Maternal Grandfather    Heart disease Maternal Grandfather    Hyperlipidemia Maternal Grandfather    Hypertension Maternal Grandfather    Heart attack Maternal  Grandfather 60   Mental illness Maternal Grandfather    Heart failure Paternal Grandmother    Arthritis Paternal Grandmother    Diabetes Paternal Grandmother    Heart attack Paternal Grandmother    Miscarriages / Stillbirths Paternal Grandmother    Mental illness Paternal Grandmother    Kidney disease Paternal Grandmother    Intellectual disability Paternal Grandmother    Hypertension Paternal Grandmother    Hyperlipidemia Paternal Grandmother    Heart disease Paternal Grandmother    Obesity Paternal Grandmother    Heart failure Paternal Grandfather    Arthritis Paternal Grandfather    Heart attack Paternal Grandfather    Mental illness Paternal Grandfather    Hearing loss Paternal Grandfather    Heart disease Paternal Grandfather    Hyperlipidemia Paternal Grandfather    Hypertension Paternal Grandfather    Breast cancer Neg Hx    Prostate cancer Neg Hx    Ovarian cancer Neg Hx    Endometrial cancer Neg Hx    Pancreatic cancer Neg Hx      Current Outpatient Medications:    abemaciclib  (VERZENIO ) 150 MG tablet, Take 1 tablet (150 mg total) by mouth 2 (two) times daily., Disp: 56 tablet, Rfl: 3   ALPRAZolam  (XANAX ) 0.5 MG tablet, Take 1 tablet (0.5 mg total) by mouth at bedtime as needed for anxiety., Disp: 30 tablet, Rfl: 3   aspirin EC 81 MG tablet, Take 81 mg by mouth daily. Swallow whole., Disp: , Rfl:    aspirin-acetaminophen -caffeine (EXCEDRIN MIGRAINE) 250-250-65 MG tablet, Take 1-2 tablets by mouth 2 (two) times daily as needed for headache or migraine., Disp: , Rfl:    Blood Glucose Monitoring Suppl (ACCU-CHEK AVIVA PLUS) w/Device KIT, Use to check blood sugar once daily at 6 (six) AM., Disp: 1 kit, Rfl: 0   carvedilol  (COREG ) 25 MG tablet, Take 1 tablet (25 mg total) by mouth 2 (two) times daily with a meal., Disp: 180 tablet, Rfl: 0   cholecalciferol (VITAMIN D3) 25 MCG (1000 UNIT) tablet, Take 1,000 Units by mouth daily., Disp: , Rfl:    esomeprazole (NEXIUM) 20 MG  capsule, Take 20 mg by mouth daily at 12 noon., Disp: , Rfl:    gabapentin  (NEURONTIN ) 100 MG capsule, Take 1 capsule (100 mg total) by mouth 3 (three) times daily., Disp: 90 capsule, Rfl: 3   glucose blood (ACCU-CHEK AVIVA PLUS) test strip, Use as instructed, Disp: 100 each, Rfl: 12   glucose blood (ACCU-CHEK GUIDE TEST) test strip, Use to check blood sugar once a day at 6 am., Disp: 50 strip, Rfl: 0   goserelin (ZOLADEX ) 3.6 MG injection, Inject 3.6 mg into the skin every 28 (twenty-eight) days., Disp: , Rfl:    Lancets (ONETOUCH DELICA PLUS LANCET33G) MISC, Use to check blood sugar once a day at 6 am., Disp: 100 each, Rfl: 0   letrozole  (FEMARA ) 2.5 MG tablet, Take 1 tablet (2.5 mg total) by mouth daily., Disp: 90 tablet, Rfl: 3   lisinopril -hydrochlorothiazide  (ZESTORETIC ) 10-12.5 MG tablet, Take 1 tablet by mouth daily., Disp:  90 tablet, Rfl: 1   ONE TOUCH CLUB LANCETS MISC, 1 each by Does not apply route daily., Disp: 100 each, Rfl: 11   prochlorperazine  (COMPAZINE ) 10 MG tablet, Take 1 tablet (10 mg total) by mouth every 6 (six) hours as needed for nausea or vomiting., Disp: 30 tablet, Rfl: 2   Semaglutide , 2 MG/DOSE, 8 MG/3ML SOPN, Inject 2 mg as directed once a week., Disp: 3 mL, Rfl: 2   Vitamin D , Ergocalciferol , (DRISDOL ) 1.25 MG (50000 UNIT) CAPS capsule, Take 1 capsule (50,000 Units total) by mouth every 7 (seven) days., Disp: 12 capsule, Rfl: 0  PHYSICAL EXAM ECOG FS:1 - Symptomatic but completely ambulatory    Vitals:   10/16/23 0905 10/16/23 0910 10/16/23 1035  BP: 133/80  110/68  Pulse: (!) 101  94  Resp: 16  16  Temp: (!) 97.5 F (36.4 C)    SpO2: 98%  100%  Weight: (!) 331 lb (150.1 kg) (!) 329 lb 8 oz (149.5 kg)    Physical Exam Vitals and nursing note reviewed.  Constitutional:      Appearance: She is not ill-appearing or toxic-appearing.  HENT:     Head: Normocephalic.  Eyes:     Conjunctiva/sclera: Conjunctivae normal.  Cardiovascular:     Rate and Rhythm:  Regular rhythm. Tachycardia present.     Pulses: Normal pulses.     Heart sounds: Normal heart sounds.     Comments: HR 101 Pulmonary:     Effort: Pulmonary effort is normal.     Breath sounds: Normal breath sounds.  Abdominal:     General: There is no distension.     Tenderness: There is no abdominal tenderness.  Musculoskeletal:     Cervical back: Normal range of motion.  Skin:    General: Skin is warm and dry.  Neurological:     Mental Status: She is alert.        LABORATORY DATA I have reviewed the data as listed    Latest Ref Rng & Units 10/14/2023    1:36 PM 09/26/2023    2:19 PM 08/29/2023   12:29 PM  CBC  WBC 4.0 - 10.5 K/uL 3.5  3.0  4.7   Hemoglobin 12.0 - 15.0 g/dL 9.0  9.5  88.8   Hematocrit 36.0 - 46.0 % 24.8  26.4  31.4   Platelets 150 - 400 K/uL 165  155  172         Latest Ref Rng & Units 10/14/2023    1:36 PM 09/26/2023    2:19 PM 08/29/2023   12:29 PM  CMP  Glucose 70 - 99 mg/dL 859  822  805   BUN 6 - 20 mg/dL 12  17  14    Creatinine 0.44 - 1.00 mg/dL 8.93  9.10  9.04   Sodium 135 - 145 mmol/L 136  136  134   Potassium 3.5 - 5.1 mmol/L 3.7  3.8  4.3   Chloride 98 - 111 mmol/L 102  102  98   CO2 22 - 32 mmol/L 26  27  30    Calcium 8.9 - 10.3 mg/dL 9.3  9.2  9.4   Total Protein 6.5 - 8.1 g/dL 7.0  6.9  7.2   Total Bilirubin 0.0 - 1.2 mg/dL 0.8  0.5  0.6   Alkaline Phos 38 - 126 U/L 59  58  71   AST 15 - 41 U/L 27  23  25    ALT 0 - 44 U/L 22  27  28        RADIOGRAPHIC STUDIES (from last 24 hours if applicable) I have personally reviewed the radiological images as listed and agreed with the findings in the report. No results found.      Visit Diagnosis: 1. Diarrhea, unspecified type   2. Malignant neoplasm of overlapping sites of left breast in female, estrogen receptor positive (HCC)   3. Dysuria      No orders of the defined types were placed in this encounter.   All questions were answered. The patient knows to call the clinic  with any problems, questions or concerns. No barriers to learning was detected.  A total of more than 30 minutes were spent on this encounter with face-to-face time and non-face-to-face time, including preparing to see the patient, ordering tests and/or medications, counseling the patient and coordination of care as outlined above.    Thank you for allowing me to participate in the care of this patient.    Miasia Crabtree E  Walisiewicz, PA-C Department of Hematology/Oncology [facility] Phone: [phone]  [phone]    10/16/2023 1:20 PM

## 2023-10-18 ENCOUNTER — Telehealth: Payer: Self-pay | Admitting: Licensed Clinical Social Worker

## 2023-10-18 NOTE — Telephone Encounter (Signed)
 [facility] Clinical Social Work  TC to patient regarding Finding Your New Normal [facility]) class. Pt is interested and able to attend. Details provided via phone and e-mail.   Emerita Berkemeier E Zaul Hubers, LCSW

## 2023-10-19 LAB — URINE CULTURE: Culture: 10000 — AB

## 2023-10-21 ENCOUNTER — Telehealth: Payer: Self-pay

## 2023-10-21 ENCOUNTER — Other Ambulatory Visit: Payer: Self-pay

## 2023-10-21 ENCOUNTER — Other Ambulatory Visit (HOSPITAL_COMMUNITY): Payer: Self-pay

## 2023-10-21 ENCOUNTER — Other Ambulatory Visit: Payer: Self-pay | Admitting: Physician Assistant

## 2023-10-21 ENCOUNTER — Encounter: Payer: Self-pay | Admitting: Hematology and Oncology

## 2023-10-21 ENCOUNTER — Encounter: Payer: Self-pay | Admitting: Physician Assistant

## 2023-10-21 ENCOUNTER — Inpatient Hospital Stay

## 2023-10-21 DIAGNOSIS — Z1721 Progesterone receptor positive status: Secondary | ICD-10-CM | POA: Diagnosis not present

## 2023-10-21 DIAGNOSIS — Z170 Estrogen receptor positive status [ER+]: Secondary | ICD-10-CM | POA: Diagnosis not present

## 2023-10-21 DIAGNOSIS — R197 Diarrhea, unspecified: Secondary | ICD-10-CM

## 2023-10-21 DIAGNOSIS — Z79811 Long term (current) use of aromatase inhibitors: Secondary | ICD-10-CM | POA: Diagnosis not present

## 2023-10-21 DIAGNOSIS — C50812 Malignant neoplasm of overlapping sites of left female breast: Secondary | ICD-10-CM | POA: Diagnosis not present

## 2023-10-21 DIAGNOSIS — R300 Dysuria: Secondary | ICD-10-CM | POA: Diagnosis not present

## 2023-10-21 DIAGNOSIS — Z87891 Personal history of nicotine dependence: Secondary | ICD-10-CM | POA: Diagnosis not present

## 2023-10-21 DIAGNOSIS — K219 Gastro-esophageal reflux disease without esophagitis: Secondary | ICD-10-CM | POA: Diagnosis not present

## 2023-10-21 DIAGNOSIS — Z79899 Other long term (current) drug therapy: Secondary | ICD-10-CM | POA: Diagnosis not present

## 2023-10-21 DIAGNOSIS — D6481 Anemia due to antineoplastic chemotherapy: Secondary | ICD-10-CM | POA: Diagnosis not present

## 2023-10-21 DIAGNOSIS — Z7982 Long term (current) use of aspirin: Secondary | ICD-10-CM | POA: Diagnosis not present

## 2023-10-21 DIAGNOSIS — Z1732 Human epidermal growth factor receptor 2 negative status: Secondary | ICD-10-CM | POA: Diagnosis not present

## 2023-10-21 DIAGNOSIS — Z5111 Encounter for antineoplastic chemotherapy: Secondary | ICD-10-CM | POA: Diagnosis not present

## 2023-10-21 DIAGNOSIS — D61818 Other pancytopenia: Secondary | ICD-10-CM | POA: Diagnosis not present

## 2023-10-21 DIAGNOSIS — Z87440 Personal history of urinary (tract) infections: Secondary | ICD-10-CM | POA: Diagnosis not present

## 2023-10-21 DIAGNOSIS — K529 Noninfective gastroenteritis and colitis, unspecified: Secondary | ICD-10-CM | POA: Diagnosis not present

## 2023-10-21 DIAGNOSIS — R634 Abnormal weight loss: Secondary | ICD-10-CM | POA: Diagnosis not present

## 2023-10-21 DIAGNOSIS — Z8052 Family history of malignant neoplasm of bladder: Secondary | ICD-10-CM | POA: Diagnosis not present

## 2023-10-21 DIAGNOSIS — I10 Essential (primary) hypertension: Secondary | ICD-10-CM | POA: Diagnosis not present

## 2023-10-21 DIAGNOSIS — Z7985 Long-term (current) use of injectable non-insulin antidiabetic drugs: Secondary | ICD-10-CM | POA: Diagnosis not present

## 2023-10-21 DIAGNOSIS — E282 Polycystic ovarian syndrome: Secondary | ICD-10-CM | POA: Diagnosis not present

## 2023-10-21 DIAGNOSIS — K449 Diaphragmatic hernia without obstruction or gangrene: Secondary | ICD-10-CM | POA: Diagnosis not present

## 2023-10-21 DIAGNOSIS — T451X5A Adverse effect of antineoplastic and immunosuppressive drugs, initial encounter: Secondary | ICD-10-CM | POA: Diagnosis not present

## 2023-10-21 MED ORDER — ONDANSETRON HCL 8 MG PO TABS
8.0000 mg | ORAL_TABLET | Freq: Three times a day (TID) | ORAL | 0 refills | Status: DC | PRN
  Filled 2023-10-21 – 2023-11-05 (×2): qty 20, 7d supply, fill #0

## 2023-10-21 NOTE — Telephone Encounter (Signed)
 Reached out to patient regarding recent myChart message. Lynn Combes, PA-C aware. Patient is scheduled to drop-off stool sample tomorrow at 9:00 AM. Lab appointment created and confirmed. Patient aware that it typically takes 24-hours to process results, but someone will be in touch on Wednesday once results become available. For nausea, patient advised to continue to take nausea medications. Patient reports that she is due to take compazine  again.  Patient received new prescription for zofran  today. Patient confirmed that she will take dose when her husband picks it up from the pharmacy.  Reviewed other interventions and education with patient.  Patient agreeable to the plan, but knows to contact the office should she have any additional questions or concerns.

## 2023-10-21 NOTE — Patient Instructions (Signed)
 Food Choices to Help Relieve Diarrhea, Adult Diarrhea can make you feel weak and cause you to become dehydrated. Dehydration is a condition in which there is not enough water or other fluids in the body. It is important to choose the right foods and drinks to: Relieve diarrhea. Replace lost fluids and nutrients. Prevent dehydration. What are tips for following this plan? Relieving diarrhea Avoid foods that make your diarrhea worse. These may include: Foods and drinks that are sweetened with high-fructose corn syrup, honey, or sweeteners such as xylitol, sorbitol, and mannitol. Check food labels for these ingredients. Fried, greasy, or spicy foods. Raw fruits and vegetables. Eat foods that are rich in probiotics. These include foods such as yogurt and fermented milk products. Probiotics can help increase healthy bacteria in your stomach and intestines (gastrointestinal or GI tract). This may help digestion and stop diarrhea. If you have lactose intolerance, avoid dairy products. These may make your diarrhea worse. Take medicine to help stop diarrhea only as told by your health care provider. Replacing nutrients  Eat bland, easy-to-digest foods in small amounts as you are able, until your diarrhea starts to get better. These foods include bananas, applesauce, rice, toast, and crackers. Over time, add nutrient-rich foods as your body tolerates them or as told by your health care provider. These include: Well-cooked protein foods, such as eggs, lean meats like fish or chicken without skin, and tofu. Peeled, seeded, and soft-cooked fruits and vegetables. Low-fat dairy products. Whole grains. Take vitamin and mineral supplements as told by your health care provider. Preventing dehydration  Start by sipping water or a solution to prevent dehydration (oral rehydration solution, or ORS). This is a drink that helps replace fluids and minerals your body has lost. You can buy an ORS at pharmacies and  retail stores. Try to drink at least 8-10 cups (2,000-2,500 mL) of fluid each day to help replace lost fluids. If your urine is pale yellow, you are getting enough fluids. You may drink other liquids in addition to water, such as fruit juice that you have added water to (diluted fruit juice) or low-calorie sports drinks, as tolerated or as told by your health care provider. Avoid drinks with caffeine, such as coffee, tea, or soft drinks. Avoid alcohol. This information is not intended to replace advice given to you by your health care provider. Make sure you discuss any questions you have with your health care provider. Document Revised: 09/05/2021 Document Reviewed: 09/05/2021 Elsevier Patient Education  2024 [employer].

## 2023-10-21 NOTE — Telephone Encounter (Signed)
 Spoke with patient regarding recent myChart message noting that patient was experiencing diarrhea and abdominal pain again.  Patient reports no pain this morning, but stomach is tender to the touch. Pain is intermittent and comes on with BM. Patient has taken compazine  this morning to help with nausea. She has had 5 watery loose stools since waking up this morning. Patient reports taking imodium, but has still experienced 4 loose stools since taking. Patient offered to come in to have stool studies done. Patient agreeable and appointment made for 11:30 AM. Confirmed appointment time. Mallie Combes, PA-C aware.

## 2023-10-21 NOTE — Progress Notes (Signed)
 Per Mallie Combes, PA-C- okay to discharge patient home without patient being able to provide stool sample. Patient is to provide stool sample at home and to message clinic when stool sample has been obtained. Lab appointment will be scheduled for patient to drop off.  Patient also prescribed zofran  to [facility] to assist with nausea. Patient agreeable to the plan. Reviewed interventions to help with n/d including increasing fluid intake and eating a bland diet. Patient verbalized an understanding of the education. Strict ED precautions reviewed. Patient ambulatory at time of discharge.

## 2023-10-22 ENCOUNTER — Inpatient Hospital Stay

## 2023-10-22 ENCOUNTER — Telehealth: Payer: Self-pay

## 2023-10-22 DIAGNOSIS — D61818 Other pancytopenia: Secondary | ICD-10-CM | POA: Diagnosis not present

## 2023-10-22 DIAGNOSIS — I10 Essential (primary) hypertension: Secondary | ICD-10-CM | POA: Diagnosis not present

## 2023-10-22 DIAGNOSIS — E282 Polycystic ovarian syndrome: Secondary | ICD-10-CM | POA: Diagnosis not present

## 2023-10-22 DIAGNOSIS — Z1732 Human epidermal growth factor receptor 2 negative status: Secondary | ICD-10-CM | POA: Diagnosis not present

## 2023-10-22 DIAGNOSIS — Z7982 Long term (current) use of aspirin: Secondary | ICD-10-CM | POA: Diagnosis not present

## 2023-10-22 DIAGNOSIS — K219 Gastro-esophageal reflux disease without esophagitis: Secondary | ICD-10-CM | POA: Diagnosis not present

## 2023-10-22 DIAGNOSIS — C50812 Malignant neoplasm of overlapping sites of left female breast: Secondary | ICD-10-CM

## 2023-10-22 DIAGNOSIS — Z79811 Long term (current) use of aromatase inhibitors: Secondary | ICD-10-CM | POA: Diagnosis not present

## 2023-10-22 DIAGNOSIS — K529 Noninfective gastroenteritis and colitis, unspecified: Secondary | ICD-10-CM | POA: Diagnosis not present

## 2023-10-22 DIAGNOSIS — K449 Diaphragmatic hernia without obstruction or gangrene: Secondary | ICD-10-CM | POA: Diagnosis not present

## 2023-10-22 DIAGNOSIS — Z1721 Progesterone receptor positive status: Secondary | ICD-10-CM | POA: Diagnosis not present

## 2023-10-22 DIAGNOSIS — Z170 Estrogen receptor positive status [ER+]: Secondary | ICD-10-CM | POA: Diagnosis not present

## 2023-10-22 DIAGNOSIS — D6481 Anemia due to antineoplastic chemotherapy: Secondary | ICD-10-CM | POA: Diagnosis not present

## 2023-10-22 DIAGNOSIS — Z87440 Personal history of urinary (tract) infections: Secondary | ICD-10-CM | POA: Diagnosis not present

## 2023-10-22 DIAGNOSIS — Z87891 Personal history of nicotine dependence: Secondary | ICD-10-CM | POA: Diagnosis not present

## 2023-10-22 DIAGNOSIS — R634 Abnormal weight loss: Secondary | ICD-10-CM | POA: Diagnosis not present

## 2023-10-22 DIAGNOSIS — Z7985 Long-term (current) use of injectable non-insulin antidiabetic drugs: Secondary | ICD-10-CM | POA: Diagnosis not present

## 2023-10-22 DIAGNOSIS — Z79899 Other long term (current) drug therapy: Secondary | ICD-10-CM | POA: Diagnosis not present

## 2023-10-22 DIAGNOSIS — R300 Dysuria: Secondary | ICD-10-CM | POA: Diagnosis not present

## 2023-10-22 DIAGNOSIS — T451X5A Adverse effect of antineoplastic and immunosuppressive drugs, initial encounter: Secondary | ICD-10-CM | POA: Diagnosis not present

## 2023-10-22 DIAGNOSIS — Z5111 Encounter for antineoplastic chemotherapy: Secondary | ICD-10-CM | POA: Diagnosis not present

## 2023-10-22 DIAGNOSIS — Z8052 Family history of malignant neoplasm of bladder: Secondary | ICD-10-CM | POA: Diagnosis not present

## 2023-10-22 LAB — CBC WITH DIFFERENTIAL (CANCER CENTER ONLY)
Abs Immature Granulocytes: 0.02 K/uL (ref 0.00–0.07)
Basophils Absolute: 0.1 K/uL (ref 0.0–0.1)
Basophils Relative: 1 %
Eosinophils Absolute: 0.7 K/uL — ABNORMAL HIGH (ref 0.0–0.5)
Eosinophils Relative: 16 %
HCT: 25.2 % — ABNORMAL LOW (ref 36.0–46.0)
Hemoglobin: 8.9 g/dL — ABNORMAL LOW (ref 12.0–15.0)
Immature Granulocytes: 0 %
Lymphocytes Relative: 14 %
Lymphs Abs: 0.6 K/uL — ABNORMAL LOW (ref 0.7–4.0)
MCH: 31 pg (ref 26.0–34.0)
MCHC: 35.3 g/dL (ref 30.0–36.0)
MCV: 87.8 fL (ref 80.0–100.0)
Monocytes Absolute: 0.4 K/uL (ref 0.1–1.0)
Monocytes Relative: 9 %
Neutro Abs: 2.7 K/uL (ref 1.7–7.7)
Neutrophils Relative %: 60 %
Platelet Count: 146 K/uL — ABNORMAL LOW (ref 150–400)
RBC: 2.87 MIL/uL — ABNORMAL LOW (ref 3.87–5.11)
RDW: 16.2 % — ABNORMAL HIGH (ref 11.5–15.5)
WBC Count: 4.6 K/uL (ref 4.0–10.5)
nRBC: 0 % (ref 0.0–0.2)

## 2023-10-22 LAB — CMP (CANCER CENTER ONLY)
ALT: 24 U/L (ref 0–44)
AST: 24 U/L (ref 15–41)
Albumin: 3.6 g/dL (ref 3.5–5.0)
Alkaline Phosphatase: 70 U/L (ref 38–126)
Anion gap: 7 (ref 5–15)
BUN: 8 mg/dL (ref 6–20)
CO2: 28 mmol/L (ref 22–32)
Calcium: 8.9 mg/dL (ref 8.9–10.3)
Chloride: 103 mmol/L (ref 98–111)
Creatinine: 0.76 mg/dL (ref 0.44–1.00)
GFR, Estimated: >60 mL/min (ref >60)
Glucose, Bld: 143 mg/dL — ABNORMAL HIGH (ref 70–99)
Potassium: 3.8 mmol/L (ref 3.5–5.1)
Sodium: 138 mmol/L (ref 135–145)
Total Bilirubin: 0.8 mg/dL (ref 0.0–1.2)
Total Protein: 6.7 g/dL (ref 6.5–8.1)

## 2023-10-22 LAB — C DIFFICILE QUICK SCREEN W PCR REFLEX
C Diff antigen: NEGATIVE
C Diff interpretation: NOT DETECTED
C Diff toxin: NEGATIVE

## 2023-10-22 NOTE — Telephone Encounter (Signed)
 Pt viewed Ca Score results on My Chart per Dr. Vanetta Shawl note. Routed to PCP.

## 2023-10-23 ENCOUNTER — Telehealth: Payer: Self-pay | Admitting: *Deleted

## 2023-10-23 LAB — GASTROINTESTINAL PANEL BY PCR, STOOL (REPLACES STOOL CULTURE)

## 2023-10-23 NOTE — Telephone Encounter (Signed)
 Per Kaitlyn Walisiewicz, PA, called pt to f/u on negative c-diff panel and to see if pt was still actively having diarrhea. Per pt, she is still having diarrhea but has been keeping up with her nausea episodes with her prescribed nausea medication. Last Diarrhea episode was at 12 pm. Pt states she will take imodium on her next diarrhea episode. Advised to call [facility] office tomorrow if diarrhea episodes persist. Provider will offer other alternative. Pt verbalized understanding

## 2023-10-24 ENCOUNTER — Other Ambulatory Visit: Payer: Self-pay | Admitting: Hematology and Oncology

## 2023-10-28 ENCOUNTER — Inpatient Hospital Stay

## 2023-10-28 ENCOUNTER — Inpatient Hospital Stay (HOSPITAL_BASED_OUTPATIENT_CLINIC_OR_DEPARTMENT_OTHER): Admitting: Hematology and Oncology

## 2023-10-28 ENCOUNTER — Inpatient Hospital Stay: Attending: Hematology and Oncology

## 2023-10-28 ENCOUNTER — Inpatient Hospital Stay: Admitting: Hematology and Oncology

## 2023-10-28 ENCOUNTER — Other Ambulatory Visit: Payer: Self-pay

## 2023-10-28 VITALS — BP 142/80 | HR 93 | Temp 97.7°F | Resp 17 | Ht 67.0 in | Wt 335.3 lb

## 2023-10-28 DIAGNOSIS — D6481 Anemia due to antineoplastic chemotherapy: Secondary | ICD-10-CM | POA: Diagnosis not present

## 2023-10-28 DIAGNOSIS — Z7982 Long term (current) use of aspirin: Secondary | ICD-10-CM | POA: Diagnosis not present

## 2023-10-28 DIAGNOSIS — R634 Abnormal weight loss: Secondary | ICD-10-CM | POA: Diagnosis not present

## 2023-10-28 DIAGNOSIS — Z170 Estrogen receptor positive status [ER+]: Secondary | ICD-10-CM

## 2023-10-28 DIAGNOSIS — Z79811 Long term (current) use of aromatase inhibitors: Secondary | ICD-10-CM | POA: Diagnosis not present

## 2023-10-28 DIAGNOSIS — Z1721 Progesterone receptor positive status: Secondary | ICD-10-CM | POA: Diagnosis not present

## 2023-10-28 DIAGNOSIS — Z8052 Family history of malignant neoplasm of bladder: Secondary | ICD-10-CM | POA: Diagnosis not present

## 2023-10-28 DIAGNOSIS — Z87891 Personal history of nicotine dependence: Secondary | ICD-10-CM | POA: Diagnosis not present

## 2023-10-28 DIAGNOSIS — Z87440 Personal history of urinary (tract) infections: Secondary | ICD-10-CM | POA: Diagnosis not present

## 2023-10-28 DIAGNOSIS — Z79899 Other long term (current) drug therapy: Secondary | ICD-10-CM | POA: Diagnosis not present

## 2023-10-28 DIAGNOSIS — K449 Diaphragmatic hernia without obstruction or gangrene: Secondary | ICD-10-CM | POA: Diagnosis not present

## 2023-10-28 DIAGNOSIS — R300 Dysuria: Secondary | ICD-10-CM | POA: Diagnosis not present

## 2023-10-28 DIAGNOSIS — E282 Polycystic ovarian syndrome: Secondary | ICD-10-CM | POA: Diagnosis not present

## 2023-10-28 DIAGNOSIS — C50812 Malignant neoplasm of overlapping sites of left female breast: Secondary | ICD-10-CM | POA: Diagnosis not present

## 2023-10-28 DIAGNOSIS — T451X5A Adverse effect of antineoplastic and immunosuppressive drugs, initial encounter: Secondary | ICD-10-CM | POA: Diagnosis not present

## 2023-10-28 DIAGNOSIS — Z5111 Encounter for antineoplastic chemotherapy: Secondary | ICD-10-CM | POA: Diagnosis not present

## 2023-10-28 DIAGNOSIS — Z7985 Long-term (current) use of injectable non-insulin antidiabetic drugs: Secondary | ICD-10-CM | POA: Diagnosis not present

## 2023-10-28 DIAGNOSIS — D61818 Other pancytopenia: Secondary | ICD-10-CM | POA: Diagnosis not present

## 2023-10-28 DIAGNOSIS — K529 Noninfective gastroenteritis and colitis, unspecified: Secondary | ICD-10-CM | POA: Diagnosis not present

## 2023-10-28 DIAGNOSIS — Z1732 Human epidermal growth factor receptor 2 negative status: Secondary | ICD-10-CM | POA: Diagnosis not present

## 2023-10-28 DIAGNOSIS — K219 Gastro-esophageal reflux disease without esophagitis: Secondary | ICD-10-CM | POA: Diagnosis not present

## 2023-10-28 DIAGNOSIS — I10 Essential (primary) hypertension: Secondary | ICD-10-CM | POA: Diagnosis not present

## 2023-10-28 LAB — CBC WITH DIFFERENTIAL (CANCER CENTER ONLY)
Abs Immature Granulocytes: 0.06 K/uL (ref 0.00–0.07)
Basophils Absolute: 0.1 K/uL (ref 0.0–0.1)
Basophils Relative: 1 %
Eosinophils Absolute: 0.2 K/uL (ref 0.0–0.5)
Eosinophils Relative: 5 %
HCT: 26.6 % — ABNORMAL LOW (ref 36.0–46.0)
Hemoglobin: 9.1 g/dL — ABNORMAL LOW (ref 12.0–15.0)
Immature Granulocytes: 1 %
Lymphocytes Relative: 17 %
Lymphs Abs: 0.8 K/uL (ref 0.7–4.0)
MCH: 30.5 pg (ref 26.0–34.0)
MCHC: 34.2 g/dL (ref 30.0–36.0)
MCV: 89.3 fL (ref 80.0–100.0)
Monocytes Absolute: 0.5 K/uL (ref 0.1–1.0)
Monocytes Relative: 11 %
Neutro Abs: 3.2 K/uL (ref 1.7–7.7)
Neutrophils Relative %: 65 %
Platelet Count: 214 K/uL (ref 150–400)
RBC: 2.98 MIL/uL — ABNORMAL LOW (ref 3.87–5.11)
RDW: 15.2 % (ref 11.5–15.5)
WBC Count: 4.9 K/uL (ref 4.0–10.5)
nRBC: 0 % (ref 0.0–0.2)

## 2023-10-28 LAB — CMP (CANCER CENTER ONLY)
ALT: 25 U/L (ref 0–44)
AST: 23 U/L (ref 15–41)
Albumin: 3.7 g/dL (ref 3.5–5.0)
Alkaline Phosphatase: 70 U/L (ref 38–126)
Anion gap: 4 — ABNORMAL LOW (ref 5–15)
BUN: 12 mg/dL (ref 6–20)
CO2: 31 mmol/L (ref 22–32)
Calcium: 8.9 mg/dL (ref 8.9–10.3)
Chloride: 100 mmol/L (ref 98–111)
Creatinine: 0.79 mg/dL (ref 0.44–1.00)
GFR, Estimated: >60 mL/min (ref >60)
Glucose, Bld: 241 mg/dL — ABNORMAL HIGH (ref 70–99)
Potassium: 4.2 mmol/L (ref 3.5–5.1)
Sodium: 135 mmol/L (ref 135–145)
Total Bilirubin: 0.5 mg/dL (ref 0.0–1.2)
Total Protein: 6.8 g/dL (ref 6.5–8.1)

## 2023-10-28 MED ORDER — ABEMACICLIB 100 MG PO TABS
100.0000 mg | ORAL_TABLET | Freq: Two times a day (BID) | ORAL | 6 refills | Status: AC
  Filled 2023-10-28: qty 60, 30d supply, fill #0
  Filled 2023-10-29: qty 56, 28d supply, fill #0
  Filled 2023-11-26 – 2023-12-04 (×2): qty 56, 28d supply, fill #1
  Filled 2023-12-31 – 2024-02-03 (×2): qty 56, 28d supply, fill #2
  Filled 2024-02-25: qty 56, 28d supply, fill #3
  Filled 2024-04-01: qty 56, 28d supply, fill #4
  Filled 2024-04-28: qty 56, 28d supply, fill #5

## 2023-10-28 MED ORDER — GOSERELIN ACETATE 3.6 MG ~~LOC~~ IMPL
3.6000 mg | DRUG_IMPLANT | Freq: Once | SUBCUTANEOUS | Status: AC
Start: 2023-10-28 — End: 2023-10-28
  Administered 2023-10-28: 3.6 mg via SUBCUTANEOUS
  Filled 2023-10-28: qty 3.6

## 2023-10-28 NOTE — Assessment & Plan Note (Signed)
 stage IIIB ER/PR positive breast cancer diagnosed in 06/2022 currently receiving neoadjuvant chemotherapy.   06/27/2022: CT CAP: Left breast cancer, 2 left axillary lymph nodes, left adnexal cystic lesion 4 cm    Treatment Plan:  1. Neoadjuvant chemotherapy with Adriamycin  and Cytoxan  dose dense 4 followed by Taxol  weekly 10 completed 10/16/2022 2. 11/20/2022: Left lumpectomy: No residual cancer, margins negative, 0/14 lymph nodes 3. Followed by adjuvant radiation therapy 01/22/2023-03/21/2023 4.  Followed by antiestrogen therapy started 04/03/2023 ____________________________________________________ Antiestrogen therapy: ovarian function suppression with Zoladex  along with anastrozole  with Verzenio  (started 04/10/23)X 2 years, switched to letrozole  06/06/2023   Current dose of Verzenio : 150 p.o. twice daily (started 08/12/2023) Verzinio toxicities: Headaches: Switch to letrozole   intermittent diarrhea and constipation Fatigue Decreased WBC count and anemia: For now we will keep the dosage the same but in a month we might have to reduce the dosage of Verzinio based on the blood counts.   Continuing chest wall lymphedema: I sent a new request for physical therapy Labs and follow-up in 1 month

## 2023-10-28 NOTE — Progress Notes (Signed)
 Patient Care Team: Nedra Tinnie LABOR, NP as PCP - General (Internal Medicine) Odean Potts, MD as Consulting Physician (Hematology and Oncology) Ebbie Cough, MD as Consulting Physician (General Surgery) Dewey Rush, MD as Consulting Physician (Radiation Oncology)  DIAGNOSIS:  Encounter Diagnosis  Name Primary?   Malignant neoplasm of overlapping sites of left breast in female, estrogen receptor positive (HCC) Yes    SUMMARY OF ONCOLOGIC HISTORY: Oncology History  Malignant neoplasm of overlapping sites of left breast in female, estrogen receptor positive (HCC)  05/29/2022 Initial Diagnosis   Palpable abnormalities in the left breast led to mammograms which revealed 3 masses 0.7 cm at 1 o'clock position, 3 cm at 2 o'clock position, additional nodule 8 mm total span 5 cm, 5 abnormal axillary lymph nodes biopsy of the masses and the lymph node were positive for grade 3 IDC with high-grade DCIS with LVI, ER 95%, PR 40%, Ki-67 60%, HER2 2+ by IHC FISH negative   06/15/2022 Cancer Staging   Staging form: Breast, AJCC 8th Edition - Clinical: Stage IIIB (cT3, cN3, cM0, G3, ER+, PR+, HER2-) - Signed by Crawford Morna Pickle, NP on 06/15/2022 Stage prefix: Initial diagnosis Histologic grading system: 3 grade system   06/15/2022 Breast MRI   IMPRESSION: 1. Multiple sites of biopsy proven malignancy with in the lateral left breast. The overall conglomerate of suspicious findings spans approximately 4.4 x 3.5 x 3.5 cm, and contains all 3 post biopsy marking clips. 2. 10+ morphologically abnormal level I, II, III and Rotter's lymph nodes within the left axilla. 3. Diffuse skin and trabecular thickening on the left, consistent with lymphovascular spread of disease. 4. No MRI evidence of malignancy on the right.   06/20/2022 - 10/09/2022 Chemotherapy   Patient is on Treatment Plan : BREAST ADJUVANT DOSE DENSE AC q14d / PACLitaxel  q7d      Genetic Testing   [facility] Multi-Cancer  Panel+RNA was Negative. Report date is 06/13/2022.  The Multi-Cancer + RNA Panel offered by [facility] includes sequencing and/or deletion/duplication analysis of the following 70 genes:  AIP*, ALK, APC*, ATM*, AXIN2*, BAP1*, BARD1*, BLM*, BMPR1A*, BRCA1*, BRCA2*, BRIP1*, CDC73*, CDH1*, CDK4, CDKN1B*, CDKN2A, CHEK2*, CTNNA1*, DICER1*, EPCAM (del/dup only), EGFR, FH*, FLCN*, GREM1 (promoter dup only), HOXB13, KIT, LZTR1, MAX*, MBD4, MEN1*, MET, MITF, MLH1*, MSH2*, MSH3*, MSH6*, MUTYH*, NF1*, NF2*, NTHL1*, PALB2*, PDGFRA, PMS2*, POLD1*, POLE*, POT1*, PRKAR1A*, PTCH1*, PTEN*, RAD51C*, RAD51D*, RB1*, RET, SDHA* (sequencing only), SDHAF2*, SDHB*, SDHC*, SDHD*, SMAD4*, SMARCA4*, SMARCB1*, SMARCE1*, STK11*, SUFU*, TMEM127*, TP53*, TSC1*, TSC2*, VHL*. RNA analysis is performed for * genes.   11/20/2022 Surgery   A. BREAST, LEFT, LUMPECTOMY:  Fibrosis and slight inflammation consistent with treatment effects  No residual invasive or in situ carcinoma (ypT0)  Margins: All margins negative for carcinoma  Lymphovascular space invasion: Not identified  Biopsy site and biopsy clip  See oncology table   14 LN biopsied negative for cancer.    01/22/2023 - 03/21/2023 Radiation Therapy   Plan Name: Breast_L_BH Site: Breast, Left Technique: 3D Mode: Photon Dose Per Fraction: 1.8 Gy Prescribed Dose (Delivered / Prescribed): 50.4 Gy / 50.4 Gy Prescribed Fxs (Delivered / Prescribed): 28 / 28   Plan Name: Brst_L_SCV_BH Site: Sclav-LT Technique: 3D Mode: Photon Dose Per Fraction: 1.8 Gy Prescribed Dose (Delivered / Prescribed): 50.4 Gy / 50.4 Gy Prescribed Fxs (Delivered / Prescribed): 28 / 28   Plan Name: Brst_L_BH_Bst Site: Breast, Left Technique: 3D Mode: Photon Dose Per Fraction: 2 Gy Prescribed Dose (Delivered / Prescribed): 10 Gy / 10 Gy Prescribed Fxs (  Delivered / Prescribed): 5 / 5   04/2023 -  Anti-estrogen oral therapy   Anastrozole      CHIEF COMPLIANT:   HISTORY OF PRESENT  ILLNESS: Discussed the use of AI scribe software for clinical note transcription with the patient, who gave verbal consent to proceed.  History of Present Illness Lynn [name] is a 38 year old female who presents with gastrointestinal symptoms following medication use.  Her hemoglobin level is 9.1, with previous levels of 8.9 and 9.0. Her white blood cell count is 4.9, and her platelet count is 214. She denies symptoms of anemia such as exhaustion or shortness of breath.  She has experienced severe gastrointestinal symptoms, including vomiting and diarrhea, since July 1st. These symptoms were associated with Resenio, which she had been taking since June 1st. The symptoms improved after discontinuing the medication two weeks ago.     ALLERGIES:  is allergic to macrobid [nitrofurantoin macrocrystal] and ibuprofen.  MEDICATIONS:  Current Outpatient Medications  Medication Sig Dispense Refill   ALPRAZolam  (XANAX ) 0.5 MG tablet Take 1 tablet (0.5 mg total) by mouth at bedtime as needed for anxiety. 30 tablet 3   aspirin EC 81 MG tablet Take 81 mg by mouth daily. Swallow whole.     aspirin-acetaminophen -caffeine (EXCEDRIN MIGRAINE) 250-250-65 MG tablet Take 1-2 tablets by mouth 2 (two) times daily as needed for headache or migraine.     Blood Glucose Monitoring Suppl (ACCU-CHEK AVIVA PLUS) w/Device KIT Use to check blood sugar once daily at 6 (six) AM. 1 kit 0   carvedilol  (COREG ) 25 MG tablet Take 1 tablet (25 mg total) by mouth 2 (two) times daily with a meal. 180 tablet 0   cholecalciferol (VITAMIN D3) 25 MCG (1000 UNIT) tablet Take 1,000 Units by mouth daily.     esomeprazole (NEXIUM) 20 MG capsule Take 20 mg by mouth daily at 12 noon.     gabapentin  (NEURONTIN ) 100 MG capsule TAKE 1 CAPSULE(100 MG) BY MOUTH THREE TIMES DAILY 90 capsule 3   glucose blood (ACCU-CHEK AVIVA PLUS) test strip Use as instructed 100 each 12   glucose blood (ACCU-CHEK GUIDE TEST) test strip Use to check blood sugar  once a day at 6 am. 50 strip 0   goserelin (ZOLADEX ) 3.6 MG injection Inject 3.6 mg into the skin every 28 (twenty-eight) days.     Lancets (ONETOUCH DELICA PLUS LANCET33G) MISC Use to check blood sugar once a day at 6 am. 100 each 0   letrozole  (FEMARA ) 2.5 MG tablet Take 1 tablet (2.5 mg total) by mouth daily. 90 tablet 3   lisinopril -hydrochlorothiazide  (ZESTORETIC ) 10-12.5 MG tablet Take 1 tablet by mouth daily. 90 tablet 1   ondansetron  (ZOFRAN ) 8 MG tablet Take 1 tablet (8 mg total) by mouth every 8 (eight) hours as needed for nausea or vomiting. 20 tablet 0   ONE TOUCH CLUB LANCETS MISC 1 each by Does not apply route daily. 100 each 11   prochlorperazine  (COMPAZINE ) 10 MG tablet Take 1 tablet (10 mg total) by mouth every 6 (six) hours as needed for nausea or vomiting. 30 tablet 2   Semaglutide , 2 MG/DOSE, 8 MG/3ML SOPN Inject 2 mg as directed once a week. 3 mL 2   Vitamin D , Ergocalciferol , (DRISDOL ) 1.25 MG (50000 UNIT) CAPS capsule Take 1 capsule (50,000 Units total) by mouth every 7 (seven) days. 12 capsule 0   abemaciclib  (VERZENIO ) 150 MG tablet Take 1 tablet (150 mg total) by mouth 2 (two) times daily. (Patient not taking:  Reported on 10/28/2023) 56 tablet 3   No current facility-administered medications for this visit.    PHYSICAL EXAMINATION: ECOG PERFORMANCE STATUS: 1 - Symptomatic but completely ambulatory  There were no vitals filed for this visit. There were no vitals filed for this visit.  Physical Exam   (exam performed in the presence of a chaperone)  LABORATORY DATA:  I have reviewed the data as listed    Latest Ref Rng & Units 10/22/2023    9:28 AM 10/14/2023    1:36 PM 09/26/2023    2:19 PM  CMP  Glucose 70 - 99 mg/dL 856  859  822   BUN 6 - 20 mg/dL 8  12  17    Creatinine 0.44 - 1.00 mg/dL 9.23  8.93  9.10   Sodium 135 - 145 mmol/L 138  136  136   Potassium 3.5 - 5.1 mmol/L 3.8  3.7  3.8   Chloride 98 - 111 mmol/L 103  102  102   CO2 22 - 32 mmol/L 28  26   27    Calcium 8.9 - 10.3 mg/dL 8.9  9.3  9.2   Total Protein 6.5 - 8.1 g/dL 6.7  7.0  6.9   Total Bilirubin 0.0 - 1.2 mg/dL 0.8  0.8  0.5   Alkaline Phos 38 - 126 U/L 70  59  58   AST 15 - 41 U/L 24  27  23    ALT 0 - 44 U/L 24  22  27      Lab Results  Component Value Date   WBC 4.6 10/22/2023   HGB 8.9 (L) 10/22/2023   HCT 25.2 (L) 10/22/2023   MCV 87.8 10/22/2023   PLT 146 (L) 10/22/2023   NEUTROABS 2.7 10/22/2023    ASSESSMENT & PLAN:  Malignant neoplasm of overlapping sites of left breast in female, 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   06/27/2022: CT CAP: Left breast cancer, 2 left axillary lymph nodes, left adnexal cystic lesion 4 cm    Treatment Plan:  1. Neoadjuvant chemotherapy with Adriamycin  and Cytoxan  dose dense 4 followed by Taxol  weekly 10 completed 10/16/2022 2. 11/20/2022: Left lumpectomy: No residual cancer, margins negative, 0/14 lymph nodes 3. Followed by adjuvant radiation therapy 01/22/2023-03/21/2023 4.  Followed by antiestrogen therapy started 04/03/2023 ____________________________________________________ Antiestrogen therapy: ovarian function suppression with Zoladex  along with anastrozole  with Verzenio  (started 04/10/23)X 2 years, switched to letrozole  06/06/2023   Current dose of Verzenio : 150 p.o. twice daily (started 08/12/2023) Verzinio toxicities: Headaches: Switch to letrozole   Profound nausea and vomiting: At the 150 p.o. twice daily dose.  She held Verzinio for 2 weeks and the nausea and vomiting and diarrhea resolved.  Therefore we are switching her to 100 p.o. twice daily dosage. Fatigue Decreased WBC count and anemia: For now we will keep the dosage the same but in a month we might have to reduce the dosage of Verzinio based on the blood counts.    Labs and follow-up in 2 months She will come monthly for injections. Assessment & Plan Anemia due  to chemotherapy Anemia secondary to chemotherapy with hemoglobin at 9.1 g/dL, slightly improved. White blood cell count normalized, platelets recovered. The patient is not asymptomatic. - Monitor hemoglobin levels closely. - Consider blood transfusion if hemoglobin drops below 8 g/dL with symptoms.  Management of chemotherapy side effects Nausea and diarrhea from Abemaciclib  improved after discontinuation. Reverted to 100 mg dose to balance side effects  and efficacy. - Revert to 100 mg dose of Abemaciclib  (Verzenio ). - Send prescription for Abemaciclib  (Verzenio ) 100 mg to [facility].      No orders of the defined types were placed in this encounter.  The patient has a good understanding of the overall plan. she agrees with it. she will call with any problems that may develop before the next visit here. Total time spent: 30 mins including face to face time and time spent for planning, charting and co-ordination of care   Naomi MARLA Chad, MD 10/28/23

## 2023-10-29 ENCOUNTER — Telehealth: Payer: Self-pay | Admitting: Hematology and Oncology

## 2023-10-29 ENCOUNTER — Other Ambulatory Visit: Payer: Self-pay

## 2023-10-29 NOTE — Telephone Encounter (Signed)
 Spoke with patient confirming upcoming appointment

## 2023-10-30 ENCOUNTER — Other Ambulatory Visit: Payer: Self-pay

## 2023-10-31 ENCOUNTER — Other Ambulatory Visit: Payer: Self-pay

## 2023-10-31 ENCOUNTER — Other Ambulatory Visit (HOSPITAL_COMMUNITY): Payer: Self-pay

## 2023-11-04 ENCOUNTER — Other Ambulatory Visit: Payer: Self-pay

## 2023-11-04 NOTE — Progress Notes (Signed)
 Specialty Pharmacy Refill Coordination Note  Lynn [name] is a 37 y.o. female contacted today regarding refills of specialty medication(s) Abemaciclib  (VERZENIO )   Patient requested Delivery   Delivery date: 11/05/23   Verified address: [address] [place], [place] [address]   Medication will be filled on 11/04/23.

## 2023-11-05 ENCOUNTER — Other Ambulatory Visit (HOSPITAL_COMMUNITY): Payer: Self-pay

## 2023-11-06 ENCOUNTER — Other Ambulatory Visit (HOSPITAL_COMMUNITY): Payer: Self-pay

## 2023-11-13 ENCOUNTER — Ambulatory Visit: Admitting: Cardiology

## 2023-11-17 ENCOUNTER — Other Ambulatory Visit: Payer: Self-pay | Admitting: Physician Assistant

## 2023-11-17 ENCOUNTER — Other Ambulatory Visit: Payer: Self-pay | Admitting: Nurse Practitioner

## 2023-11-17 MED ORDER — ONDANSETRON HCL 8 MG PO TABS
8.0000 mg | ORAL_TABLET | Freq: Three times a day (TID) | ORAL | 0 refills | Status: DC | PRN
  Filled 2023-11-17: qty 20, 7d supply, fill #0

## 2023-11-18 ENCOUNTER — Other Ambulatory Visit: Payer: Self-pay

## 2023-11-18 ENCOUNTER — Other Ambulatory Visit (HOSPITAL_COMMUNITY): Payer: Self-pay

## 2023-11-18 MED ORDER — OZEMPIC (2 MG/DOSE) 8 MG/3ML ~~LOC~~ SOPN
2.0000 mg | PEN_INJECTOR | SUBCUTANEOUS | 2 refills | Status: DC
  Filled 2023-11-18: qty 3, 28d supply, fill #0

## 2023-11-26 ENCOUNTER — Other Ambulatory Visit: Payer: Self-pay

## 2023-11-26 ENCOUNTER — Other Ambulatory Visit (HOSPITAL_COMMUNITY): Payer: Self-pay

## 2023-11-27 ENCOUNTER — Inpatient Hospital Stay: Attending: Hematology and Oncology

## 2023-11-27 VITALS — BP 118/77 | HR 100 | Temp 98.1°F | Resp 17

## 2023-11-27 DIAGNOSIS — C50812 Malignant neoplasm of overlapping sites of left female breast: Secondary | ICD-10-CM | POA: Diagnosis not present

## 2023-11-27 DIAGNOSIS — Z5111 Encounter for antineoplastic chemotherapy: Secondary | ICD-10-CM | POA: Insufficient documentation

## 2023-11-27 DIAGNOSIS — Z79811 Long term (current) use of aromatase inhibitors: Secondary | ICD-10-CM | POA: Insufficient documentation

## 2023-11-27 DIAGNOSIS — Z1721 Progesterone receptor positive status: Secondary | ICD-10-CM | POA: Insufficient documentation

## 2023-11-27 DIAGNOSIS — Z170 Estrogen receptor positive status [ER+]: Secondary | ICD-10-CM | POA: Diagnosis not present

## 2023-11-27 MED ORDER — GOSERELIN ACETATE 3.6 MG ~~LOC~~ IMPL
3.6000 mg | DRUG_IMPLANT | Freq: Once | SUBCUTANEOUS | Status: AC
  Administered 2023-11-27: 3.6 mg via SUBCUTANEOUS
  Filled 2023-11-27: qty 3.6

## 2023-11-28 ENCOUNTER — Other Ambulatory Visit (HOSPITAL_COMMUNITY): Payer: Self-pay

## 2023-12-03 ENCOUNTER — Encounter: Payer: Self-pay | Admitting: Sports Medicine

## 2023-12-04 ENCOUNTER — Other Ambulatory Visit: Payer: Self-pay

## 2023-12-09 ENCOUNTER — Telehealth: Payer: Self-pay

## 2023-12-09 ENCOUNTER — Other Ambulatory Visit: Payer: Self-pay

## 2023-12-09 ENCOUNTER — Inpatient Hospital Stay: Attending: Hematology and Oncology

## 2023-12-09 ENCOUNTER — Inpatient Hospital Stay

## 2023-12-09 ENCOUNTER — Inpatient Hospital Stay (HOSPITAL_BASED_OUTPATIENT_CLINIC_OR_DEPARTMENT_OTHER): Admitting: Physician Assistant

## 2023-12-09 ENCOUNTER — Other Ambulatory Visit (HOSPITAL_COMMUNITY): Payer: Self-pay

## 2023-12-09 ENCOUNTER — Ambulatory Visit: Attending: Hematology and Oncology

## 2023-12-09 VITALS — BP 112/72 | HR 100 | Temp 97.7°F | Resp 18 | Wt 324.0 lb

## 2023-12-09 VITALS — BP 135/75 | HR 110 | Temp 97.7°F | Resp 16 | Wt 324.0 lb

## 2023-12-09 DIAGNOSIS — R110 Nausea: Secondary | ICD-10-CM | POA: Insufficient documentation

## 2023-12-09 DIAGNOSIS — Z8052 Family history of malignant neoplasm of bladder: Secondary | ICD-10-CM | POA: Insufficient documentation

## 2023-12-09 DIAGNOSIS — Z87891 Personal history of nicotine dependence: Secondary | ICD-10-CM | POA: Insufficient documentation

## 2023-12-09 DIAGNOSIS — Z7982 Long term (current) use of aspirin: Secondary | ICD-10-CM | POA: Diagnosis not present

## 2023-12-09 DIAGNOSIS — D72819 Decreased white blood cell count, unspecified: Secondary | ICD-10-CM | POA: Diagnosis not present

## 2023-12-09 DIAGNOSIS — Z79899 Other long term (current) drug therapy: Secondary | ICD-10-CM | POA: Diagnosis not present

## 2023-12-09 DIAGNOSIS — Z9221 Personal history of antineoplastic chemotherapy: Secondary | ICD-10-CM | POA: Insufficient documentation

## 2023-12-09 DIAGNOSIS — Z5111 Encounter for antineoplastic chemotherapy: Secondary | ICD-10-CM | POA: Diagnosis not present

## 2023-12-09 DIAGNOSIS — R112 Nausea with vomiting, unspecified: Secondary | ICD-10-CM

## 2023-12-09 DIAGNOSIS — Z170 Estrogen receptor positive status [ER+]: Secondary | ICD-10-CM

## 2023-12-09 DIAGNOSIS — Z7985 Long-term (current) use of injectable non-insulin antidiabetic drugs: Secondary | ICD-10-CM | POA: Diagnosis not present

## 2023-12-09 DIAGNOSIS — K219 Gastro-esophageal reflux disease without esophagitis: Secondary | ICD-10-CM | POA: Insufficient documentation

## 2023-12-09 DIAGNOSIS — E860 Dehydration: Secondary | ICD-10-CM | POA: Diagnosis not present

## 2023-12-09 DIAGNOSIS — R109 Unspecified abdominal pain: Secondary | ICD-10-CM | POA: Insufficient documentation

## 2023-12-09 DIAGNOSIS — E282 Polycystic ovarian syndrome: Secondary | ICD-10-CM | POA: Insufficient documentation

## 2023-12-09 DIAGNOSIS — Z800 Family history of malignant neoplasm of digestive organs: Secondary | ICD-10-CM | POA: Insufficient documentation

## 2023-12-09 DIAGNOSIS — Z79811 Long term (current) use of aromatase inhibitors: Secondary | ICD-10-CM | POA: Diagnosis not present

## 2023-12-09 DIAGNOSIS — D630 Anemia in neoplastic disease: Secondary | ICD-10-CM | POA: Diagnosis not present

## 2023-12-09 DIAGNOSIS — I10 Essential (primary) hypertension: Secondary | ICD-10-CM | POA: Diagnosis not present

## 2023-12-09 DIAGNOSIS — R7989 Other specified abnormal findings of blood chemistry: Secondary | ICD-10-CM | POA: Diagnosis not present

## 2023-12-09 DIAGNOSIS — C50812 Malignant neoplasm of overlapping sites of left female breast: Secondary | ICD-10-CM | POA: Insufficient documentation

## 2023-12-09 DIAGNOSIS — Z801 Family history of malignant neoplasm of trachea, bronchus and lung: Secondary | ICD-10-CM | POA: Diagnosis not present

## 2023-12-09 LAB — CMP (CANCER CENTER ONLY)
ALT: 23 U/L (ref 0–44)
AST: 18 U/L (ref 15–41)
Albumin: 4 g/dL (ref 3.5–5.0)
Alkaline Phosphatase: 62 U/L (ref 38–126)
Anion gap: 7 (ref 5–15)
BUN: 18 mg/dL (ref 6–20)
CO2: 30 mmol/L (ref 22–32)
Calcium: 9.5 mg/dL (ref 8.9–10.3)
Chloride: 101 mmol/L (ref 98–111)
Creatinine: 1.05 mg/dL — ABNORMAL HIGH (ref 0.44–1.00)
GFR, Estimated: >60 mL/min (ref >60)
Glucose, Bld: 116 mg/dL — ABNORMAL HIGH (ref 70–99)
Potassium: 4.4 mmol/L (ref 3.5–5.1)
Sodium: 138 mmol/L (ref 135–145)
Total Bilirubin: 0.5 mg/dL (ref 0.0–1.2)
Total Protein: 7.1 g/dL (ref 6.5–8.1)

## 2023-12-09 LAB — CBC WITH DIFFERENTIAL (CANCER CENTER ONLY)
Abs Immature Granulocytes: 0.01 K/uL (ref 0.00–0.07)
Basophils Absolute: 0 K/uL (ref 0.0–0.1)
Basophils Relative: 1 %
Eosinophils Absolute: 0.1 K/uL (ref 0.0–0.5)
Eosinophils Relative: 2 %
HCT: 28.8 % — ABNORMAL LOW (ref 36.0–46.0)
Hemoglobin: 10.1 g/dL — ABNORMAL LOW (ref 12.0–15.0)
Immature Granulocytes: 0 %
Lymphocytes Relative: 19 %
Lymphs Abs: 0.6 K/uL — ABNORMAL LOW (ref 0.7–4.0)
MCH: 29.3 pg (ref 26.0–34.0)
MCHC: 35.1 g/dL (ref 30.0–36.0)
MCV: 83.5 fL (ref 80.0–100.0)
Monocytes Absolute: 0.3 K/uL (ref 0.1–1.0)
Monocytes Relative: 8 %
Neutro Abs: 2.3 K/uL (ref 1.7–7.7)
Neutrophils Relative %: 70 %
Platelet Count: 158 K/uL (ref 150–400)
RBC: 3.45 MIL/uL — ABNORMAL LOW (ref 3.87–5.11)
RDW: 14.1 % (ref 11.5–15.5)
WBC Count: 3.3 K/uL — ABNORMAL LOW (ref 4.0–10.5)
nRBC: 0 % (ref 0.0–0.2)

## 2023-12-09 LAB — MAGNESIUM: Magnesium: 1.9 mg/dL (ref 1.7–2.4)

## 2023-12-09 MED ORDER — PROCHLORPERAZINE MALEATE 10 MG PO TABS
10.0000 mg | ORAL_TABLET | Freq: Four times a day (QID) | ORAL | 2 refills | Status: AC | PRN
  Filled 2023-12-09: qty 30, 8d supply, fill #0
  Filled 2024-01-22: qty 30, 8d supply, fill #1

## 2023-12-09 MED ORDER — ONDANSETRON HCL 8 MG PO TABS
8.0000 mg | ORAL_TABLET | Freq: Three times a day (TID) | ORAL | 1 refills | Status: AC | PRN
  Filled 2023-12-09: qty 20, 7d supply, fill #0

## 2023-12-09 MED ORDER — SODIUM CHLORIDE 0.9 % IV SOLN: Freq: Once | INTRAVENOUS | Status: AC

## 2023-12-09 MED ORDER — ONDANSETRON HCL 4 MG/2ML IJ SOLN
8.0000 mg | Freq: Once | INTRAMUSCULAR | Status: AC
  Administered 2023-12-09: 8 mg via INTRAVENOUS
  Filled 2023-12-09: qty 4

## 2023-12-09 MED ORDER — FAMOTIDINE IN NACL 20-0.9 MG/50ML-% IV SOLN
20.0000 mg | Freq: Once | INTRAVENOUS | Status: AC
  Administered 2023-12-09: 20 mg via INTRAVENOUS
  Filled 2023-12-09: qty 50

## 2023-12-09 NOTE — Patient Instructions (Signed)
 Nausea and Vomiting, Adult Nausea is feeling that you have an upset stomach and that you are about to vomit. Vomiting is when food in your stomach forcefully comes out of your mouth. Vomiting can make you feel weak. If you vomit, or if you are not able to drink enough fluids, you may not have enough water in your body (get dehydrated). If you do not have enough water in your body, you may: Feel tired. Feel thirsty. Have a dry mouth. Have cracked lips. Pee (urinate) less often. Older adults and people with other diseases or a weak body defense system (immune system) are at higher risk for not having enough water in the body. If you feel like you may vomit or you vomit, it is important to follow instructions from your doctor about how to take care of yourself. Follow these instructions at home: Watch your symptoms for any changes. Tell your doctor about them. Eating and drinking     Take an ORS (oral rehydration solution). This is a drink that is sold at pharmacies and stores. Drink clear fluids in small amounts as you are able, such as: Water. Ice chips. Fruit juice that has water added (diluted fruit juice). Low-calorie sports drinks. Eat bland, easy-to-digest foods in small amounts as you are able, such as: Bananas. Applesauce. Rice. Low-fat (lean) meats. Toast. Crackers. Avoid drinking fluids that have a lot of sugar or caffeine in them. This includes energy drinks, sports drinks, and soda. Avoid alcohol. Avoid spicy or fatty foods. General instructions Take over-the-counter and prescription medicines only as told by your doctor. Drink enough fluid to keep your pee (urine) pale yellow. Wash your hands often with soap and water for at least 20 seconds. If you cannot use soap and water, use hand sanitizer. Make sure that everyone in your home washes their hands well and often. Rest at home until you feel better. Watch your condition for any changes. Take slow and deep breaths  when you feel like you may vomit. Keep all follow-up visits. Contact a doctor if: Your symptoms get worse. You have new symptoms. You have a fever. You cannot drink fluids without vomiting. You feel like you may vomit for more than 2 days. You feel light-headed or dizzy. You have a headache. You have muscle cramps. You have a rash. You have pain while peeing. Get help right away if: You have pain in your chest, neck, arm, or jaw. You feel very weak or you faint. You vomit again and again. You have vomit that is bright red or looks like black coffee grounds. You have bloody or black poop (stools) or poop that looks like tar. You have a very bad headache, a stiff neck, or both. You have very bad pain, cramping, or bloating in your belly (abdomen). You have trouble breathing. You are breathing very quickly. Your heart is beating very quickly. Your skin feels cold and clammy. You feel confused. You have signs of losing too much water in your body, such as: Dark pee, very little pee, or no pee. Cracked lips. Dry mouth. Sunken eyes. Sleepiness. Weakness. These symptoms may be an emergency. Get help right away. Call 911. Do not wait to see if the symptoms will go away. Do not drive yourself to the hospital. Summary Nausea is feeling that you have an upset stomach and that you are about to vomit. Vomiting is when food in your stomach comes out of your mouth. Follow instructions from your doctor about eating and drinking.  Take over-the-counter and prescription medicines only as told by your doctor. Contact your doctor if your symptoms get worse or you have new symptoms. Keep all follow-up visits. This information is not intended to replace advice given to you by your health care provider. Make sure you discuss any questions you have with your health care provider. Document Revised: 09/23/2020 Document Reviewed: 09/23/2020 [employer]vier Patient Education  2024 [employer].

## 2023-12-09 NOTE — Telephone Encounter (Signed)
 This RN received v/m from Mrs. Haig w/ c/o acute onset N/V over the weekend. Pt stated I have vomited 6-7 times since 10:00 yesterday.  Pt requested [facility] appt. This RN returned phone call and spoke with Mrs. Bala for triage. This RN scheduled lab appt for 10:00 and [facility] appt for 10:30. Pt was agreeable with times and verbalized understanding.

## 2023-12-09 NOTE — Progress Notes (Signed)
 Symptom Management Consult Note [facility]    Patient Care Team: Nedra Tinnie LABOR, NP as PCP - General (Internal Medicine) Odean Potts, MD as Consulting Physician (Hematology and Oncology) Ebbie Cough, MD as Consulting Physician (General Surgery) Dewey Rush, MD as Consulting Physician (Radiation Oncology)    Name / MRN / DOB: Lynn [name]  [id number]  06-May-1985   Date of visit: 12/09/2023   Chief Complaint/Reason for visit: nausea and vomiting   Current Therapy: Verzenio      ASSESSMENT AND PLAN Patient is a 38 y.o. female with oncologic history of malignant neoplasm of overlapping sites of left breast in female, estrogen receptor positive followed by Dr. Odean.  I have viewed most recent oncology note and lab work.  #Malignant neoplasm of overlapping sites of left breast in female, estrogen receptor positive  - Next appointment with oncologist is 12/30/23  #Nausea and vomiting  - AE of Verzenio  -Nausea and vomiting likely due to Verzenio , with potential exacerbation from Ozempic . Symptoms improved previously when Verzenio  was held. - Refills sent to pharmacy for Zofran  and Compazine  - Trial lowering Ozempic  dose to assess impact on symptoms. If no improvement would hold Verzenio  until MD follow up later this month. - Mild dehydration indicated by elevated creatinine at 1.05, slightly above baseline of 0.8. 1 L IVF administered in clinic along with IV zofran  and pepcid  for symptom management. Benign abdominal exam.  #Leukopenia secondary to cancer therapy - White blood cell count decreased to 3.3, likely due to Verzenio . No infection or fever. 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   HEME/ONC HISTORY Oncology History  Malignant neoplasm of overlapping sites of left breast in female, estrogen receptor positive (HCC)  05/29/2022 Initial  Diagnosis   Palpable abnormalities in the left breast led to mammograms which revealed 3 masses 0.7 cm at 1 o'clock position, 3 cm at 2 o'clock position, additional nodule 8 mm total span 5 cm, 5 abnormal axillary lymph nodes biopsy of the masses and the lymph node were positive for grade 3 IDC with high-grade DCIS with LVI, ER 95%, PR 40%, Ki-67 60%, HER2 2+ by IHC FISH negative   06/15/2022 Cancer Staging   Staging form: Breast, AJCC 8th Edition - Clinical: Stage IIIB (cT3, cN3, cM0, G3, ER+, PR+, HER2-) - Signed by Crawford Morna Pickle, NP on 06/15/2022 Stage prefix: Initial diagnosis Histologic grading system: 3 grade system   06/15/2022 Breast MRI   IMPRESSION: 1. Multiple sites of biopsy proven malignancy with in the lateral left breast. The overall conglomerate of suspicious findings spans approximately 4.4 x 3.5 x 3.5 cm, and contains all 3 post biopsy marking clips. 2. 10+ morphologically abnormal level I, II, III and Rotter's lymph nodes within the left axilla. 3. Diffuse skin and trabecular thickening on the left, consistent with lymphovascular spread of disease. 4. No MRI evidence of malignancy on the right.   06/20/2022 - 10/09/2022 Chemotherapy   Patient is on Treatment Plan : BREAST ADJUVANT DOSE DENSE AC q14d / PACLitaxel  q7d      Genetic Testing   [facility] Multi-Cancer Panel+RNA was Negative. Report date is 06/13/2022.  The Multi-Cancer + RNA Panel offered by [facility] includes sequencing and/or deletion/duplication analysis of the following 70 genes:  AIP*, ALK, APC*, ATM*, AXIN2*, BAP1*, BARD1*, BLM*, BMPR1A*, BRCA1*, BRCA2*, BRIP1*, CDC73*, CDH1*, CDK4, CDKN1B*, CDKN2A, CHEK2*, CTNNA1*, DICER1*, EPCAM (del/dup only), EGFR, FH*, FLCN*, GREM1 (  promoter dup only), HOXB13, KIT, LZTR1, MAX*, MBD4, MEN1*, MET, MITF, MLH1*, MSH2*, MSH3*, MSH6*, MUTYH*, NF1*, NF2*, NTHL1*, PALB2*, PDGFRA, PMS2*, POLD1*, POLE*, POT1*, PRKAR1A*, PTCH1*, PTEN*, RAD51C*, RAD51D*, RB1*, RET, SDHA*  (sequencing only), SDHAF2*, SDHB*, SDHC*, SDHD*, SMAD4*, SMARCA4*, SMARCB1*, SMARCE1*, STK11*, SUFU*, TMEM127*, TP53*, TSC1*, TSC2*, VHL*. RNA analysis is performed for * genes.   11/20/2022 Surgery   A. BREAST, LEFT, LUMPECTOMY:  Fibrosis and slight inflammation consistent with treatment effects  No residual invasive or in situ carcinoma (ypT0)  Margins: All margins negative for carcinoma  Lymphovascular space invasion: Not identified  Biopsy site and biopsy clip  See oncology table   14 LN biopsied negative for cancer.    01/22/2023 - 03/21/2023 Radiation Therapy   Plan Name: Breast_L_BH Site: Breast, Left Technique: 3D Mode: Photon Dose Per Fraction: 1.8 Gy Prescribed Dose (Delivered / Prescribed): 50.4 Gy / 50.4 Gy Prescribed Fxs (Delivered / Prescribed): 28 / 28   Plan Name: Brst_L_SCV_BH Site: Sclav-LT Technique: 3D Mode: Photon Dose Per Fraction: 1.8 Gy Prescribed Dose (Delivered / Prescribed): 50.4 Gy / 50.4 Gy Prescribed Fxs (Delivered / Prescribed): 28 / 28   Plan Name: Brst_L_BH_Bst Site: Breast, Left Technique: 3D Mode: Photon Dose Per Fraction: 2 Gy Prescribed Dose (Delivered / Prescribed): 10 Gy / 10 Gy Prescribed Fxs (Delivered / Prescribed): 5 / 5   04/2023 -  Anti-estrogen oral therapy   Anastrozole        INTERVAL HISTORY  Discussed the use of AI scribe software for clinical note transcription with the patient, who gave verbal consent to proceed.    Lynn [name] is a 38 y.o. female with oncologic history as above presenting to [facility] today with chief complaint of nausea and vomiting. Accompanied to clinic today by spouse who provides additional history.  She has been experiencing nausea and vomiting after resuming Verzenio  at a lower dose four weeks ago. Initially, she tolerated the lower dose but began nauseas x 1 week ago. For the last 3 days symptoms have been persistent and also includes lack of appetite.  She was taking zofran  every morning with  her Verzenio  for preemptive nausea however has stopped the last several days as it was ineffective. She is requesting a refill of compazine  as well as zofran .   She has been on Ozempic  for several years and generally tolerates it well, although she experienced nausea when her dose was increased. Recently, she resumed Ozempic  at a high dose without titration and wonders if this may be contributing to her symptoms. She has been on the high dose of Ozempic  before however remembers titration to that dose.  No fever or diarrhea. She reports abdominal pain primarily when vomiting, with tenderness on the sides of her stomach. Denis urinary symptoms or sick contacts. No suspicious food intake, recent travel or antibiotic use.    ROS  All other systems are reviewed and are negative for acute change except as noted in the HPI.    Allergies  Allergen Reactions   Macrobid [Nitrofurantoin Macrocrystal] Shortness Of Breath and Nausea And Vomiting   Ibuprofen Hives     Past Medical History:  Diagnosis Date   Anemia    Breast cancer (HCC) 02/28   Cancer (HCC) 2024   L breast   Carpal tunnel syndrome, left    Depression    Diabetes (HCC)    Dysrhythmia    Palpitations   GERD (gastroesophageal reflux disease)    Hypertension    Iron deficiency    Panic attacks  PCOS (polycystic ovarian syndrome)    Personal history of chemotherapy    Pneumonia    Port-A-Cath in place 07/31/2022   Tachycardia    Umbilical hernia      Past Surgical History:  Procedure Laterality Date   ADENOIDECTOMY     As a child   AXILLARY LYMPH NODE DISSECTION Left 11/20/2022   Procedure: LEFT AXILLARY DISSECTION;  Surgeon: Ebbie Cough, MD;  Location: [facility] OR;  Service: General;  Laterality: Left;   BREAST BIOPSY Left 05/29/2022   US  LT BREAST BX W LOC DEV 1ST LESION IMG BX SPEC US  GUIDE 05/29/2022 [facility]   BREAST BIOPSY Left 05/29/2022   US  LT BREAST BX W LOC DEV EA ADD LESION IMG BX SPEC US   GUIDE 05/29/2022 [facility]   BREAST BIOPSY Left 06/06/2022   MM LT BREAST BX W LOC DEV 1ST LESION IMAGE BX SPEC STEREO GUIDE 06/06/2022 [facility]   BREAST BIOPSY  11/19/2022   MM LT RADIOACTIVE SEED LOC MAMMO GUIDE 11/19/2022 [facility]   BREAST BIOPSY  11/19/2022   MM LT RADIOACTIVE SEED EA ADD LESION LOC MAMMO GUIDE 11/19/2022 [facility]   BREAST BIOPSY  11/19/2022   MM LT RADIOACTIVE SEED EA ADD LESION LOC MAMMO GUIDE 11/19/2022 [facility]   BREAST LUMPECTOMY WITH RADIOACTIVE SEED AND AXILLARY LYMPH NODE DISSECTION Left 11/20/2022   Procedure: LEFT BREAST LUMPECTOMY WITH RADIOACTIVE SEED;  Surgeon: Ebbie Cough, MD;  Location: [facility] OR;  Service: General;  Laterality: Left;  PEC BLOCK   CESAREAN SECTION  2015   PORT-A-CATH REMOVAL Right 11/20/2022   Procedure: REMOVAL PORT-A-CATH;  Surgeon: Ebbie Cough, MD;  Location: [facility] OR;  Service: General;  Laterality: Right;   PORTACATH PLACEMENT N/A 06/19/2022   Procedure: INSERTION PORT-A-CATH;  Surgeon: Ebbie Cough, MD;  Location: [facility] OR;  Service: General;  Laterality: N/A;   TONSILLECTOMY     As a child    Social History   Socioeconomic History   Marital status: Married    Spouse name: Not on file   Number of children: 1   Years of education: some college   Highest education level: Associate degree: occupational, Scientist, product/process development, or vocational program  Occupational History   Not on file  Tobacco Use   Smoking status: Former    Current packs/day: 0.00    Average packs/day: 1 pack/day for 7.0 years (7.0 ttl pk-yrs)    Types: Cigarettes, E-cigarettes    Start date: 03/20/2007    Quit date: 10/29/2021    Years since quitting: 2.1   Smokeless tobacco: Never   Tobacco comments:    Smoked cigarettes for 7 years, quit in 2015. Started vaping 2020 and just quit in July this year.  Vaping Use   Vaping status: Former   Quit date: 04/03/2019  Substance and Sexual Activity   Alcohol use: Not Currently     Comment: Only on special occasions   Drug use: Yes    Frequency: 1.0 times per week    Types: Marijuana   Sexual activity: Yes    Birth control/protection: Condom    Comment: Also on Zoladex  implant for [facility]  Other Topics Concern   Not on file  Social History Narrative      Social Drivers of Health   Financial Resource Strain: Medium Risk (10/08/2023)   Overall Financial Resource Strain (CARDIA)    Difficulty of Paying Living Expenses: Somewhat hard  Food Insecurity: Food Insecurity Present (10/08/2023)   Hunger Vital Sign    Worried About  Running Out of Food in the Last Year: Sometimes true    Ran Out of Food in the Last Year: Sometimes true  Transportation Needs: No Transportation Needs (10/08/2023)   PRAPARE - Administrator, Civil Service (Medical): No    Lack of Transportation (Non-Medical): No  Physical Activity: Insufficiently Active (10/08/2023)   Exercise Vital Sign    Days of Exercise per Week: 1 day    Minutes of Exercise per Session: 10 min  Stress: Stress Concern Present (10/08/2023)   [employer] of Occupational Health - Occupational Stress Questionnaire    Feeling of Stress: To some extent  Social Connections: Socially Integrated (10/08/2023)   Social Connection and Isolation Panel    Frequency of Communication with Friends and Family: Twice a week    Frequency of Social Gatherings with Friends and Family: Once a week    Attends Religious Services: More than 4 times per year    Active Member of [employer] or Organizations: Yes    Attends Banker Meetings: More than 4 times per year    Marital Status: Married  Catering manager Violence: Not At Risk (11/20/2022)   Humiliation, Afraid, Rape, and Kick questionnaire    Fear of Current or Ex-Partner: No    Emotionally Abused: No    Physically Abused: No    Sexually Abused: No    Family History  Problem Relation Age of Onset   Heart disease Mother    Diabetes Mother    Hypertension Mother     Kidney disease Mother    Heart failure Mother    Heart attack Mother 60   Arthritis Mother    Asthma Mother    COPD Mother    Depression Mother    Hyperlipidemia Mother    Mental illness Mother    Bladder Cancer Mother    Anemia Mother    Anxiety disorder Mother    Early death Mother    Obesity Mother    Stroke Mother    Varicose Veins Mother    Heart disease Father    Heart attack Father 64   COPD Father    Hyperlipidemia Father    Hypertension Father    Mental illness Father    Anxiety disorder Father    Early death Father    Obesity Father    Varicose Veins Father    Arthritis Sister    Depression Sister    Diabetes Sister    Hypertension Sister    Mental illness Sister    Miscarriages / Stillbirths Sister    Anxiety disorder Sister    Obesity Sister    Varicose Veins Sister    Alcohol abuse Sister    Depression Sister    Diabetes Sister    Heart disease Sister    Heart attack Sister 49   Arthritis Sister    Kidney disease Sister    Transient ischemic attack Sister    Hypertension Sister    Anxiety disorder Sister    Obesity Sister    Stroke Sister    Bladder Cancer Maternal Aunt    Colon cancer Maternal Aunt 5 - 74   Other Maternal Aunt 23 - 59       brain tumor (unknown if benign or malignant)   Lung cancer Paternal Aunt 29   Melanoma Paternal Aunt 59 - 49   Heart failure Maternal Grandmother    Arthritis Maternal Grandmother    Alcohol abuse Maternal Grandmother    COPD Maternal Grandmother  Diabetes Maternal Grandmother    Hearing loss Maternal Grandmother    Heart disease Maternal Grandmother    Hyperlipidemia Maternal Grandmother    Hypertension Maternal Grandmother    Stroke Maternal Grandmother    Miscarriages / Stillbirths Maternal Grandmother    Mental illness Maternal Grandmother    Heart attack Maternal Grandmother    Obesity Maternal Grandmother    Heart failure Maternal Grandfather    Alcohol abuse Maternal Grandfather     Arthritis Maternal Grandfather    Early death Maternal Grandfather    Heart disease Maternal Grandfather    Hyperlipidemia Maternal Grandfather    Hypertension Maternal Grandfather    Heart attack Maternal Grandfather 60   Mental illness Maternal Grandfather    Heart failure Paternal Grandmother    Arthritis Paternal Grandmother    Diabetes Paternal Grandmother    Heart attack Paternal Grandmother    Miscarriages / Stillbirths Paternal Grandmother    Mental illness Paternal Grandmother    Kidney disease Paternal Grandmother    Intellectual disability Paternal Grandmother    Hypertension Paternal Grandmother    Hyperlipidemia Paternal Grandmother    Heart disease Paternal Grandmother    Obesity Paternal Grandmother    Heart failure Paternal Grandfather    Arthritis Paternal Grandfather    Heart attack Paternal Grandfather    Mental illness Paternal Grandfather    Hearing loss Paternal Grandfather    Heart disease Paternal Grandfather    Hyperlipidemia Paternal Grandfather    Hypertension Paternal Grandfather    Breast cancer Neg Hx    Prostate cancer Neg Hx    Ovarian cancer Neg Hx    Endometrial cancer Neg Hx    Pancreatic cancer Neg Hx      Current Outpatient Medications:    abemaciclib  (VERZENIO ) 100 MG tablet, Take 1 tablet (100 mg total) by mouth 2 (two) times daily., Disp: 60 tablet, Rfl: 6   ALPRAZolam  (XANAX ) 0.5 MG tablet, Take 1 tablet (0.5 mg total) by mouth at bedtime as needed for anxiety., Disp: 30 tablet, Rfl: 3   aspirin EC 81 MG tablet, Take 81 mg by mouth daily. Swallow whole., Disp: , Rfl:    aspirin-acetaminophen -caffeine (EXCEDRIN MIGRAINE) 250-250-65 MG tablet, Take 1-2 tablets by mouth 2 (two) times daily as needed for headache or migraine., Disp: , Rfl:    Blood Glucose Monitoring Suppl (ACCU-CHEK AVIVA PLUS) w/Device KIT, Use to check blood sugar once daily at 6 (six) AM., Disp: 1 kit, Rfl: 0   carvedilol  (COREG ) 25 MG tablet, Take 1 tablet (25 mg  total) by mouth 2 (two) times daily with a meal., Disp: 180 tablet, Rfl: 0   cholecalciferol (VITAMIN D3) 25 MCG (1000 UNIT) tablet, Take 1,000 Units by mouth daily., Disp: , Rfl:    esomeprazole (NEXIUM) 20 MG capsule, Take 20 mg by mouth daily at 12 noon., Disp: , Rfl:    gabapentin  (NEURONTIN ) 100 MG capsule, TAKE 1 CAPSULE(100 MG) BY MOUTH THREE TIMES DAILY, Disp: 90 capsule, Rfl: 3   glucose blood (ACCU-CHEK AVIVA PLUS) test strip, Use as instructed, Disp: 100 each, Rfl: 12   glucose blood (ACCU-CHEK GUIDE TEST) test strip, Use to check blood sugar once a day at 6 am., Disp: 50 strip, Rfl: 0   goserelin (ZOLADEX ) 3.6 MG injection, Inject 3.6 mg into the skin every 28 (twenty-eight) days., Disp: , Rfl:    Lancets (ONETOUCH DELICA PLUS LANCET33G) MISC, Use to check blood sugar once a day at 6 am., Disp: 100 each, Rfl: 0  letrozole  (FEMARA ) 2.5 MG tablet, Take 1 tablet (2.5 mg total) by mouth daily., Disp: 90 tablet, Rfl: 3   lisinopril -hydrochlorothiazide  (ZESTORETIC ) 10-12.5 MG tablet, Take 1 tablet by mouth daily., Disp: 90 tablet, Rfl: 1   ondansetron  (ZOFRAN ) 8 MG tablet, Take 1 tablet (8 mg total) by mouth every 8 (eight) hours as needed for nausea or vomiting., Disp: 20 tablet, Rfl: 1   ONE TOUCH CLUB LANCETS MISC, 1 each by Does not apply route daily., Disp: 100 each, Rfl: 11   prochlorperazine  (COMPAZINE ) 10 MG tablet, Take 1 tablet (10 mg total) by mouth every 6 (six) hours as needed for nausea or vomiting., Disp: 30 tablet, Rfl: 2   Semaglutide , 2 MG/DOSE, (OZEMPIC , 2 MG/DOSE,) 8 MG/3ML SOPN, Inject 2 mg as directed once a week., Disp: 3 mL, Rfl: 2   Vitamin D , Ergocalciferol , (DRISDOL ) 1.25 MG (50000 UNIT) CAPS capsule, Take 1 capsule (50,000 Units total) by mouth every 7 (seven) days., Disp: 12 capsule, Rfl: 0  PHYSICAL EXAM ECOG FS:1 - Symptomatic but completely ambulatory    Vitals:   12/09/23 1034 12/09/23 1257  BP: 135/75 112/72  Pulse: (!) 110 100  Resp: 16 18  Temp:  97.7 F (36.5 C)   TempSrc: Temporal   SpO2: 96% 99%  Weight: (!) 324 lb (147 kg)    Physical Exam Vitals and nursing note reviewed.  Constitutional:      Appearance: She is not ill-appearing or toxic-appearing.  HENT:     Head: Normocephalic.  Eyes:     Conjunctiva/sclera: Conjunctivae normal.  Cardiovascular:     Rate and Rhythm: Regular rhythm. Tachycardia present.     Pulses: Normal pulses.     Heart sounds: Normal heart sounds.  Pulmonary:     Effort: Pulmonary effort is normal.     Breath sounds: Normal breath sounds.  Abdominal:     General: There is no distension.  Musculoskeletal:     Cervical back: Normal range of motion.  Skin:    General: Skin is warm and dry.  Neurological:     Mental Status: She is alert.        LABORATORY DATA I have reviewed the data as listed    Latest Ref Rng & Units 12/09/2023   10:13 AM 10/28/2023   12:07 PM 10/22/2023    9:28 AM  CBC  WBC 4.0 - 10.5 K/uL 3.3  4.9  4.6   Hemoglobin 12.0 - 15.0 g/dL 89.8  9.1  8.9   Hematocrit 36.0 - 46.0 % 28.8  26.6  25.2   Platelets 150 - 400 K/uL 158  214  146         Latest Ref Rng & Units 12/09/2023   10:13 AM 10/28/2023   12:07 PM 10/22/2023    9:28 AM  CMP  Glucose 70 - 99 mg/dL 883  758  856   BUN 6 - 20 mg/dL 18  12  8    Creatinine 0.44 - 1.00 mg/dL 8.94  9.20  9.23   Sodium 135 - 145 mmol/L 138  135  138   Potassium 3.5 - 5.1 mmol/L 4.4  4.2  3.8   Chloride 98 - 111 mmol/L 101  100  103   CO2 22 - 32 mmol/L 30  31  28    Calcium 8.9 - 10.3 mg/dL 9.5  8.9  8.9   Total Protein 6.5 - 8.1 g/dL 7.1  6.8  6.7   Total Bilirubin 0.0 - 1.2 mg/dL 0.5  0.5  0.8   Alkaline Phos 38 - 126 U/L 62  70  70   AST 15 - 41 U/L 18  23  24    ALT 0 - 44 U/L 23  25  24         RADIOGRAPHIC STUDIES (from last 24 hours if applicable) I have personally reviewed the radiological images as listed and agreed with the findings in the report. No results found.      Visit Diagnosis: 1. Malignant  neoplasm of overlapping sites of left breast in female, estrogen receptor positive (HCC)   2. Nausea and vomiting, unspecified vomiting type      No orders of the defined types were placed in this encounter.   All questions were answered. The patient knows to call the clinic with any problems, questions or concerns. No barriers to learning was detected.  A total of more than 30 minutes were spent on this encounter with face-to-face time and non-face-to-face time, including preparing to see the patient, ordering tests and/or medications, counseling the patient and coordination of care as outlined above.    Thank you for allowing me to participate in the care of this patient.    Gayle Martinez E  Walisiewicz, PA-C Department of Hematology/Oncology [facility] Phone: [phone]  [phone]    12/09/2023 2:20 PM

## 2023-12-23 ENCOUNTER — Other Ambulatory Visit: Payer: Self-pay

## 2023-12-24 ENCOUNTER — Other Ambulatory Visit: Payer: Self-pay

## 2023-12-25 ENCOUNTER — Telehealth: Payer: Self-pay | Admitting: Nurse Practitioner

## 2023-12-25 ENCOUNTER — Ambulatory Visit: Payer: Self-pay | Admitting: *Deleted

## 2023-12-25 NOTE — Telephone Encounter (Signed)
 Patient scheduled for 12/27/23.

## 2023-12-25 NOTE — Telephone Encounter (Signed)
 FYI: This call has been transferred to triage nurse: the Triage Nurse. Once the result note has been entered staff can address the message at that time.  Patient called in with the following symptoms:  Red Word:pt scheduled an appt via mychart for 01/01/24 for     Ozempic  is no longer agreeing with body, been vomiting a lot. Need to change meds. I spoke with nurse triage, she will call her back.   Please advise at [facility] [phone]  Message is routed to Provider Pool.

## 2023-12-25 NOTE — Telephone Encounter (Signed)
 NT received call from office requesting triage of patient who scheduled on MyChart for medication SE-(Ozempic )-  caller had disconnected at transfer- attempted to call patient back- no answer- left message to call office

## 2023-12-25 NOTE — Telephone Encounter (Signed)
 On hold for 12/26/23

## 2023-12-25 NOTE — Telephone Encounter (Signed)
 FYI Only or Action Required?: Action required by provider: update on patient condition.  Patient was last seen in primary care on 10/08/2023 by Nedra Tinnie LABOR, NP.  Called Nurse Triage reporting Medication Problem.  Symptoms began about a month ago.  Interventions attempted: Rest, hydration, or home remedies.  Symptoms are: gradually worsening.  Triage Disposition: See PCP When Office is Open (Within 3 Days) (overriding Call PCP Now)  Patient/caregiver understands and will follow disposition?:    Reason for Disposition  [1] Caller has URGENT medicine question about med that primary care doctor (or NP/PA) or specialist prescribed AND [2] triager unable to answer question  Answer Assessment - Initial Assessment Questions Patient reports that she has been experiencing GI symptoms from Ozempic  x 1 month. Was also on the chemo maintenance medication abemaciclib  . Oncologist had her d/c abemaciclib  but and symptoms returned, then decreased she decreased dosage and symptoms returned. Has not been on abemaciclib   for 2 weeks, has f/u scheduled with oncologist for further management.  Patient states she decreased Ozempic  to 1 mg and that the day after injection, she began having nausea, diarrhea, vomiting, bloating. States zofran  not effective, vomiting occurs one day after administration and lasts 2-3 days.   Last dose of ozempic  was 9/16, continues to have diarrhea and bloating. Denies dark, tarry or coffee grind appearance to stools. ED precautions reviewed, pt verbalized understanding.   1. NAME of MEDICINE: What medicine(s) are you calling about?     Ozempic   2. PREGNANCY:  Is there any chance that you are pregnant? When was your last menstrual period?     Denies  Protocols used: Medication Question Call-A-AH

## 2023-12-27 ENCOUNTER — Ambulatory Visit: Admitting: Nurse Practitioner

## 2023-12-30 ENCOUNTER — Inpatient Hospital Stay

## 2023-12-30 ENCOUNTER — Other Ambulatory Visit: Payer: Self-pay

## 2023-12-30 ENCOUNTER — Inpatient Hospital Stay: Admitting: Hematology and Oncology

## 2023-12-30 VITALS — BP 130/98

## 2023-12-30 VITALS — BP 142/79 | HR 70 | Temp 97.8°F | Resp 18 | Ht 67.0 in | Wt 327.8 lb

## 2023-12-30 DIAGNOSIS — E282 Polycystic ovarian syndrome: Secondary | ICD-10-CM | POA: Diagnosis not present

## 2023-12-30 DIAGNOSIS — Z8052 Family history of malignant neoplasm of bladder: Secondary | ICD-10-CM | POA: Diagnosis not present

## 2023-12-30 DIAGNOSIS — Z7985 Long-term (current) use of injectable non-insulin antidiabetic drugs: Secondary | ICD-10-CM | POA: Diagnosis not present

## 2023-12-30 DIAGNOSIS — C50812 Malignant neoplasm of overlapping sites of left female breast: Secondary | ICD-10-CM

## 2023-12-30 DIAGNOSIS — Z7982 Long term (current) use of aspirin: Secondary | ICD-10-CM | POA: Diagnosis not present

## 2023-12-30 DIAGNOSIS — R109 Unspecified abdominal pain: Secondary | ICD-10-CM | POA: Diagnosis not present

## 2023-12-30 DIAGNOSIS — Z800 Family history of malignant neoplasm of digestive organs: Secondary | ICD-10-CM | POA: Diagnosis not present

## 2023-12-30 DIAGNOSIS — Z79899 Other long term (current) drug therapy: Secondary | ICD-10-CM | POA: Diagnosis not present

## 2023-12-30 DIAGNOSIS — Z5111 Encounter for antineoplastic chemotherapy: Secondary | ICD-10-CM | POA: Diagnosis not present

## 2023-12-30 DIAGNOSIS — R7989 Other specified abnormal findings of blood chemistry: Secondary | ICD-10-CM | POA: Diagnosis not present

## 2023-12-30 DIAGNOSIS — Z170 Estrogen receptor positive status [ER+]: Secondary | ICD-10-CM

## 2023-12-30 DIAGNOSIS — D72819 Decreased white blood cell count, unspecified: Secondary | ICD-10-CM | POA: Diagnosis not present

## 2023-12-30 DIAGNOSIS — I10 Essential (primary) hypertension: Secondary | ICD-10-CM | POA: Diagnosis not present

## 2023-12-30 DIAGNOSIS — E860 Dehydration: Secondary | ICD-10-CM | POA: Diagnosis not present

## 2023-12-30 DIAGNOSIS — Z79811 Long term (current) use of aromatase inhibitors: Secondary | ICD-10-CM | POA: Diagnosis not present

## 2023-12-30 DIAGNOSIS — Z9221 Personal history of antineoplastic chemotherapy: Secondary | ICD-10-CM | POA: Diagnosis not present

## 2023-12-30 DIAGNOSIS — K219 Gastro-esophageal reflux disease without esophagitis: Secondary | ICD-10-CM | POA: Diagnosis not present

## 2023-12-30 DIAGNOSIS — Z87891 Personal history of nicotine dependence: Secondary | ICD-10-CM | POA: Diagnosis not present

## 2023-12-30 DIAGNOSIS — R110 Nausea: Secondary | ICD-10-CM | POA: Diagnosis not present

## 2023-12-30 DIAGNOSIS — D630 Anemia in neoplastic disease: Secondary | ICD-10-CM | POA: Diagnosis not present

## 2023-12-30 DIAGNOSIS — Z801 Family history of malignant neoplasm of trachea, bronchus and lung: Secondary | ICD-10-CM | POA: Diagnosis not present

## 2023-12-30 LAB — CMP (CANCER CENTER ONLY)
ALT: 27 U/L (ref 0–44)
AST: 20 U/L (ref 15–41)
Albumin: 4 g/dL (ref 3.5–5.0)
Alkaline Phosphatase: 80 U/L (ref 38–126)
Anion gap: 5 (ref 5–15)
BUN: 13 mg/dL (ref 6–20)
CO2: 30 mmol/L (ref 22–32)
Calcium: 9 mg/dL (ref 8.9–10.3)
Chloride: 99 mmol/L (ref 98–111)
Creatinine: 0.8 mg/dL (ref 0.44–1.00)
GFR, Estimated: >60 mL/min (ref >60)
Glucose, Bld: 255 mg/dL — ABNORMAL HIGH (ref 70–99)
Potassium: 4 mmol/L (ref 3.5–5.1)
Sodium: 134 mmol/L — ABNORMAL LOW (ref 135–145)
Total Bilirubin: 0.5 mg/dL (ref 0.0–1.2)
Total Protein: 7.2 g/dL (ref 6.5–8.1)

## 2023-12-30 LAB — CBC WITH DIFFERENTIAL (CANCER CENTER ONLY)
Abs Immature Granulocytes: 0.02 K/uL (ref 0.00–0.07)
Basophils Absolute: 0.1 K/uL (ref 0.0–0.1)
Basophils Relative: 1 %
Eosinophils Absolute: 0.1 K/uL (ref 0.0–0.5)
Eosinophils Relative: 2 %
HCT: 31.1 % — ABNORMAL LOW (ref 36.0–46.0)
Hemoglobin: 10.9 g/dL — ABNORMAL LOW (ref 12.0–15.0)
Immature Granulocytes: 0 %
Lymphocytes Relative: 20 %
Lymphs Abs: 1.1 K/uL (ref 0.7–4.0)
MCH: 28.5 pg (ref 26.0–34.0)
MCHC: 35 g/dL (ref 30.0–36.0)
MCV: 81.4 fL (ref 80.0–100.0)
Monocytes Absolute: 0.5 K/uL (ref 0.1–1.0)
Monocytes Relative: 10 %
Neutro Abs: 3.6 K/uL (ref 1.7–7.7)
Neutrophils Relative %: 67 %
Platelet Count: 225 K/uL (ref 150–400)
RBC: 3.82 MIL/uL — ABNORMAL LOW (ref 3.87–5.11)
RDW: 14.2 % (ref 11.5–15.5)
WBC Count: 5.4 K/uL (ref 4.0–10.5)
nRBC: 0 % (ref 0.0–0.2)

## 2023-12-30 MED ORDER — GOSERELIN ACETATE 3.6 MG ~~LOC~~ IMPL
3.6000 mg | DRUG_IMPLANT | Freq: Once | SUBCUTANEOUS | Status: AC
  Administered 2023-12-30: 3.6 mg via SUBCUTANEOUS

## 2023-12-30 NOTE — Assessment & Plan Note (Signed)
 stage IIIB ER/PR positive breast cancer diagnosed in 06/2022 currently receiving neoadjuvant chemotherapy.   06/27/2022: CT CAP: Left breast cancer, 2 left axillary lymph nodes, left adnexal cystic lesion 4 cm    Treatment Plan:  1. Neoadjuvant chemotherapy with Adriamycin  and Cytoxan  dose dense 4 followed by Taxol  weekly 10 completed 10/16/2022 2. 11/20/2022: Left lumpectomy: No residual cancer, margins negative, 0/14 lymph nodes 3. Followed by adjuvant radiation therapy 01/22/2023-03/21/2023 4.  Followed by antiestrogen therapy started 04/03/2023 ____________________________________________________ Antiestrogen therapy: ovarian function suppression with Zoladex  along with anastrozole  with Verzenio  (started 04/10/23)X 2 years, switched to letrozole  06/06/2023   Current dose of Verzenio : 150 p.o. twice daily (started 08/12/2023) Verzinio toxicities: Headaches: Switch to letrozole   Profound nausea and vomiting: Dose had been previously reduced to 100 mg twice daily Fatigue Decreased WBC count and anemia: Monitoring closely  Labs and follow-up in 2 months She will come monthly for injections.

## 2023-12-30 NOTE — Progress Notes (Signed)
 Patient Care Team: Nedra Tinnie LABOR, NP as PCP - General (Internal Medicine) Odean Potts, MD as Consulting Physician (Hematology and Oncology) Ebbie Cough, MD as Consulting Physician (General Surgery) Dewey Rush, MD as Consulting Physician (Radiation Oncology)  DIAGNOSIS:  Encounter Diagnosis  Name Primary?   Malignant neoplasm of overlapping sites of left breast in female, estrogen receptor positive (HCC) Yes    SUMMARY OF ONCOLOGIC HISTORY: Oncology History  Malignant neoplasm of overlapping sites of left breast in female, estrogen receptor positive (HCC)  05/29/2022 Initial Diagnosis   Palpable abnormalities in the left breast led to mammograms which revealed 3 masses 0.7 cm at 1 o'clock position, 3 cm at 2 o'clock position, additional nodule 8 mm total span 5 cm, 5 abnormal axillary lymph nodes biopsy of the masses and the lymph node were positive for grade 3 IDC with high-grade DCIS with LVI, ER 95%, PR 40%, Ki-67 60%, HER2 2+ by IHC FISH negative   06/15/2022 Cancer Staging   Staging form: Breast, AJCC 8th Edition - Clinical: Stage IIIB (cT3, cN3, cM0, G3, ER+, PR+, HER2-) - Signed by Crawford Morna Pickle, NP on 06/15/2022 Stage prefix: Initial diagnosis Histologic grading system: 3 grade system   06/15/2022 Breast MRI   IMPRESSION: 1. Multiple sites of biopsy proven malignancy with in the lateral left breast. The overall conglomerate of suspicious findings spans approximately 4.4 x 3.5 x 3.5 cm, and contains all 3 post biopsy marking clips. 2. 10+ morphologically abnormal level I, II, III and Rotter's lymph nodes within the left axilla. 3. Diffuse skin and trabecular thickening on the left, consistent with lymphovascular spread of disease. 4. No MRI evidence of malignancy on the right.   06/20/2022 - 10/09/2022 Chemotherapy   Patient is on Treatment Plan : BREAST ADJUVANT DOSE DENSE AC q14d / PACLitaxel  q7d      Genetic Testing   [facility] Multi-Cancer  Panel+RNA was Negative. Report date is 06/13/2022.  The Multi-Cancer + RNA Panel offered by [facility] includes sequencing and/or deletion/duplication analysis of the following 70 genes:  AIP*, ALK, APC*, ATM*, AXIN2*, BAP1*, BARD1*, BLM*, BMPR1A*, BRCA1*, BRCA2*, BRIP1*, CDC73*, CDH1*, CDK4, CDKN1B*, CDKN2A, CHEK2*, CTNNA1*, DICER1*, EPCAM (del/dup only), EGFR, FH*, FLCN*, GREM1 (promoter dup only), HOXB13, KIT, LZTR1, MAX*, MBD4, MEN1*, MET, MITF, MLH1*, MSH2*, MSH3*, MSH6*, MUTYH*, NF1*, NF2*, NTHL1*, PALB2*, PDGFRA, PMS2*, POLD1*, POLE*, POT1*, PRKAR1A*, PTCH1*, PTEN*, RAD51C*, RAD51D*, RB1*, RET, SDHA* (sequencing only), SDHAF2*, SDHB*, SDHC*, SDHD*, SMAD4*, SMARCA4*, SMARCB1*, SMARCE1*, STK11*, SUFU*, TMEM127*, TP53*, TSC1*, TSC2*, VHL*. RNA analysis is performed for * genes.   11/20/2022 Surgery   A. BREAST, LEFT, LUMPECTOMY:  Fibrosis and slight inflammation consistent with treatment effects  No residual invasive or in situ carcinoma (ypT0)  Margins: All margins negative for carcinoma  Lymphovascular space invasion: Not identified  Biopsy site and biopsy clip  See oncology table   14 LN biopsied negative for cancer.    01/22/2023 - 03/21/2023 Radiation Therapy   Plan Name: Breast_L_BH Site: Breast, Left Technique: 3D Mode: Photon Dose Per Fraction: 1.8 Gy Prescribed Dose (Delivered / Prescribed): 50.4 Gy / 50.4 Gy Prescribed Fxs (Delivered / Prescribed): 28 / 28   Plan Name: Brst_L_SCV_BH Site: Sclav-LT Technique: 3D Mode: Photon Dose Per Fraction: 1.8 Gy Prescribed Dose (Delivered / Prescribed): 50.4 Gy / 50.4 Gy Prescribed Fxs (Delivered / Prescribed): 28 / 28   Plan Name: Brst_L_BH_Bst Site: Breast, Left Technique: 3D Mode: Photon Dose Per Fraction: 2 Gy Prescribed Dose (Delivered / Prescribed): 10 Gy / 10 Gy Prescribed Fxs (  Delivered / Prescribed): 5 / 5   04/2023 -  Anti-estrogen oral therapy   Anastrozole      CHIEF COMPLIANT: Follow-up of Verzinio  HISTORY OF  PRESENT ILLNESS:   History of Present Illness Lynn [name] is a 38 year old female with breast cancer who presents with gastrointestinal side effects from Ozempic  and Verzenio . She was referred by her primary doctor for evaluation of medication side effects.  She experiences significant nausea and vomiting attributed to Ozempic  and Verzenio . Initially, she tolerated Ozempic  well for over two years but recently developed severe nausea and vomiting. She discontinued Verzenio  a few weeks ago due to similar symptoms and has not resumed it. After attempting to restart Ozempic  at a lower dose of 1 mg, she experienced severe vomiting, unable to retain any intake. Symptoms onset is sudden, with no time to reach the bathroom. Despite Ozempic 's intended effects, she has not experienced significant weight loss.  Her hemoglobin was 8.9 two months ago and is currently 10.9. She has not experienced significant diarrhea, only a few manageable bouts with over-the-counter medication.     ALLERGIES:  is allergic to macrobid [nitrofurantoin macrocrystal] and ibuprofen.  MEDICATIONS:  Current Outpatient Medications  Medication Sig Dispense Refill   abemaciclib  (VERZENIO ) 100 MG tablet Take 1 tablet (100 mg total) by mouth 2 (two) times daily. 60 tablet 6   ALPRAZolam  (XANAX ) 0.5 MG tablet Take 1 tablet (0.5 mg total) by mouth at bedtime as needed for anxiety. 30 tablet 3   aspirin EC 81 MG tablet Take 81 mg by mouth daily. Swallow whole.     aspirin-acetaminophen -caffeine (EXCEDRIN MIGRAINE) 250-250-65 MG tablet Take 1-2 tablets by mouth 2 (two) times daily as needed for headache or migraine.     Blood Glucose Monitoring Suppl (ACCU-CHEK AVIVA PLUS) w/Device KIT Use to check blood sugar once daily at 6 (six) AM. 1 kit 0   carvedilol  (COREG ) 25 MG tablet Take 1 tablet (25 mg total) by mouth 2 (two) times daily with a meal. 180 tablet 0   cholecalciferol (VITAMIN D3) 25 MCG (1000 UNIT) tablet Take 1,000 Units by  mouth daily.     esomeprazole (NEXIUM) 20 MG capsule Take 20 mg by mouth daily at 12 noon.     gabapentin  (NEURONTIN ) 100 MG capsule TAKE 1 CAPSULE(100 MG) BY MOUTH THREE TIMES DAILY 90 capsule 3   glucose blood (ACCU-CHEK AVIVA PLUS) test strip Use as instructed 100 each 12   glucose blood (ACCU-CHEK GUIDE TEST) test strip Use to check blood sugar once a day at 6 am. 50 strip 0   goserelin (ZOLADEX ) 3.6 MG injection Inject 3.6 mg into the skin every 28 (twenty-eight) days.     Lancets (ONETOUCH DELICA PLUS LANCET33G) MISC Use to check blood sugar once a day at 6 am. 100 each 0   letrozole  (FEMARA ) 2.5 MG tablet Take 1 tablet (2.5 mg total) by mouth daily. 90 tablet 3   lisinopril -hydrochlorothiazide  (ZESTORETIC ) 10-12.5 MG tablet Take 1 tablet by mouth daily. 90 tablet 1   ondansetron  (ZOFRAN ) 8 MG tablet Take 1 tablet (8 mg total) by mouth every 8 (eight) hours as needed for nausea or vomiting. 20 tablet 1   ONE TOUCH CLUB LANCETS MISC 1 each by Does not apply route daily. 100 each 11   prochlorperazine  (COMPAZINE ) 10 MG tablet Take 1 tablet (10 mg total) by mouth every 6 (six) hours as needed for nausea or vomiting. 30 tablet 2   Semaglutide , 2 MG/DOSE, (OZEMPIC , 2 MG/DOSE,)  8 MG/3ML SOPN Inject 2 mg as directed once a week. 3 mL 2   Vitamin D , Ergocalciferol , (DRISDOL ) 1.25 MG (50000 UNIT) CAPS capsule Take 1 capsule (50,000 Units total) by mouth every 7 (seven) days. 12 capsule 0   No current facility-administered medications for this visit.    PHYSICAL EXAMINATION: ECOG PERFORMANCE STATUS: 1 - Symptomatic but completely ambulatory  Vitals:   12/30/23 1414 12/30/23 1415  BP: (!) 152/64 (!) 142/79  Pulse: 92 70  Resp: 18   Temp: 97.8 F (36.6 C)   SpO2: 98%    Filed Weights   12/30/23 1414  Weight: (!) 327 lb 12.8 oz (148.7 kg)    Physical Exam MEASUREMENTS: BMI- 30.8.  (exam performed in the presence of a chaperone)  LABORATORY DATA:  I have reviewed the data as  listed    Latest Ref Rng & Units 12/09/2023   10:13 AM 10/28/2023   12:07 PM 10/22/2023    9:28 AM  CMP  Glucose 70 - 99 mg/dL 883  758  856   BUN 6 - 20 mg/dL 18  12  8    Creatinine 0.44 - 1.00 mg/dL 8.94  9.20  9.23   Sodium 135 - 145 mmol/L 138  135  138   Potassium 3.5 - 5.1 mmol/L 4.4  4.2  3.8   Chloride 98 - 111 mmol/L 101  100  103   CO2 22 - 32 mmol/L 30  31  28    Calcium 8.9 - 10.3 mg/dL 9.5  8.9  8.9   Total Protein 6.5 - 8.1 g/dL 7.1  6.8  6.7   Total Bilirubin 0.0 - 1.2 mg/dL 0.5  0.5  0.8   Alkaline Phos 38 - 126 U/L 62  70  70   AST 15 - 41 U/L 18  23  24    ALT 0 - 44 U/L 23  25  24      Lab Results  Component Value Date   WBC 5.4 12/30/2023   HGB 10.9 (L) 12/30/2023   HCT 31.1 (L) 12/30/2023   MCV 81.4 12/30/2023   PLT 225 12/30/2023   NEUTROABS 3.6 12/30/2023    ASSESSMENT & PLAN:  Malignant neoplasm of overlapping sites of left breast in female, 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   06/27/2022: CT CAP: Left breast cancer, 2 left axillary lymph nodes, left adnexal cystic lesion 4 cm    Treatment Plan:  1. Neoadjuvant chemotherapy with Adriamycin  and Cytoxan  dose dense 4 followed by Taxol  weekly 10 completed 10/16/2022 2. 11/20/2022: Left lumpectomy: No residual cancer, margins negative, 0/14 lymph nodes 3. Followed by adjuvant radiation therapy 01/22/2023-03/21/2023 4.  Followed by antiestrogen therapy started 04/03/2023 ____________________________________________________ Antiestrogen therapy: ovarian function suppression with Zoladex  along with anastrozole  with Verzenio  (started 04/10/23)X 2 years, switched to letrozole  06/06/2023   Current dose of Verzenio : 150 p.o. twice daily (started 08/12/2023) Verzinio toxicities: Headaches: Switch to letrozole   Profound nausea and vomiting: Dose had been previously reduced to 100 mg twice daily Fatigue Decreased WBC  count and anemia: Monitoring closely  Labs and follow-up in 2 months She will come monthly for injections. ------------------------------------- Assessment and Plan Assessment & Plan Type 2 Diabetes Mellitus: Patient experienced severe nausea with Ozempic  and has been off it for a couple of weeks. Previously tolerated Verzenio  with mild side effects. -Resume Verzenio  at 100mg  and monitor for tolerability. -If side effects increase, consider increasing to 150mg  daily. Patient  to contact office before renewing prescription at this dose. -Plan to continue Verzenio  for at least 2 years, with a goal of 3 months total treatment.  Breast Cancer Surveillance: Patient is due for a mammogram in February. -Schedule mammogram for May 30, 2022.   General Health Maintenance: -Continue monthly injection appointments and lab visits. -Next appointment in two months.      No orders of the defined types were placed in this encounter.  The patient has a good understanding of the overall plan. she agrees with it. she will call with any problems that may develop before the next visit here. Total time spent: 30 mins including face to face time and time spent for planning, charting and co-ordination of care   Naomi MARLA Chad, MD 12/30/23

## 2023-12-31 ENCOUNTER — Other Ambulatory Visit: Payer: Self-pay

## 2024-01-01 ENCOUNTER — Telehealth: Payer: Self-pay

## 2024-01-01 ENCOUNTER — Telehealth: Payer: Self-pay | Admitting: Hematology and Oncology

## 2024-01-01 ENCOUNTER — Encounter: Payer: Self-pay | Admitting: Nurse Practitioner

## 2024-01-01 ENCOUNTER — Telehealth: Payer: Self-pay | Admitting: Nurse Practitioner

## 2024-01-01 ENCOUNTER — Ambulatory Visit: Admitting: Nurse Practitioner

## 2024-01-01 NOTE — Telephone Encounter (Signed)
 Per md orders entered for [employer] and all supported documents faxed to [phone]. Faxed confirmation was received.

## 2024-01-01 NOTE — Telephone Encounter (Signed)
 Noted

## 2024-01-01 NOTE — Telephone Encounter (Signed)
 left vm for pt to call back and schedule appts per 9/29 los

## 2024-01-01 NOTE — Telephone Encounter (Signed)
 Same day cancellations via mychart 12/27/2023 & 01/01/2024 . Final warning sent via mail and mychart.  Pt has rescheduled for 10/3.

## 2024-01-02 ENCOUNTER — Other Ambulatory Visit: Payer: Self-pay

## 2024-01-03 ENCOUNTER — Ambulatory Visit: Admitting: Nurse Practitioner

## 2024-01-03 ENCOUNTER — Encounter: Payer: Self-pay | Admitting: Hematology and Oncology

## 2024-01-07 ENCOUNTER — Telehealth: Payer: Self-pay | Admitting: Nurse Practitioner

## 2024-01-07 NOTE — Telephone Encounter (Signed)
 Noted

## 2024-01-07 NOTE — Telephone Encounter (Signed)
 Same day cancellations via mychart 12/27/2023 & 01/01/2024 . No show 01/03/2024. Another final warning sent via mail and mychart at provider request.

## 2024-01-20 ENCOUNTER — Encounter: Payer: Self-pay | Admitting: Hematology and Oncology

## 2024-01-20 ENCOUNTER — Other Ambulatory Visit: Payer: Self-pay

## 2024-01-22 ENCOUNTER — Encounter: Payer: Self-pay | Admitting: Cardiology

## 2024-01-22 ENCOUNTER — Other Ambulatory Visit: Payer: Self-pay | Admitting: Hematology and Oncology

## 2024-01-22 ENCOUNTER — Ambulatory Visit: Attending: Cardiology | Admitting: Cardiology

## 2024-01-22 VITALS — BP 112/70 | HR 88 | Ht 67.0 in | Wt 326.0 lb

## 2024-01-22 DIAGNOSIS — E119 Type 2 diabetes mellitus without complications: Secondary | ICD-10-CM

## 2024-01-22 DIAGNOSIS — C50919 Malignant neoplasm of unspecified site of unspecified female breast: Secondary | ICD-10-CM

## 2024-01-22 DIAGNOSIS — R0789 Other chest pain: Secondary | ICD-10-CM

## 2024-01-22 DIAGNOSIS — I119 Hypertensive heart disease without heart failure: Secondary | ICD-10-CM

## 2024-01-22 DIAGNOSIS — I10 Essential (primary) hypertension: Secondary | ICD-10-CM

## 2024-01-22 NOTE — Addendum Note (Signed)
 Addended by: ARLOA PLANAS D on: 01/22/2024 02:31 PM   Modules accepted: Orders

## 2024-01-22 NOTE — Progress Notes (Signed)
 Cardiology Office Note:    Date:  01/22/2024   ID:  Lynn [name], DOB 14-Jul-1985, MRN [id number]  PCP:  Nedra Tinnie LABOR, NP  Cardiologist:  Lamar Fitch, MD    Referring MD: Nedra Tinnie LABOR, NP   Chief Complaint  Patient presents with   Follow-up    History of Present Illness:    Lynn [name] is a 38 y.o. female past medical history significant for essential hypertension, diabetes, morbid obesity, obstructive sleep apnea she was referred to us  because of atypical chest pain.  Coronary calcium score has been done which is 0.  She comes today after many months.  She is doing well still described to have some chest pain very rare at this point of very atypical sharp stabbing lasting only for few seconds a split-second not related to exercise.  She lost about 20 pounds since I seen her last time she is doing quite well from that aspect.  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 but otherwise seems to be doing well  Past Medical History:  Diagnosis Date   Anemia    Breast cancer (HCC) 02/28   Cancer (HCC) 2024   L breast   Carpal tunnel syndrome, left    Depression    Diabetes (HCC)    Dysrhythmia    Palpitations   GERD (gastroesophageal reflux disease)    Hypertension    Iron deficiency    Panic attacks    PCOS (polycystic ovarian syndrome)    Personal history of chemotherapy    Pneumonia    Port-A-Cath in place 07/31/2022   Tachycardia    Umbilical hernia     Past Surgical History:  Procedure Laterality Date   ADENOIDECTOMY     As a child   AXILLARY LYMPH NODE DISSECTION Left 11/20/2022   Procedure: LEFT AXILLARY DISSECTION;  Surgeon: Ebbie Cough, MD;  Location: [facility] OR;  Service: General;  Laterality: Left;   BREAST BIOPSY Left 05/29/2022   US  LT BREAST BX W LOC DEV 1ST LESION IMG BX SPEC US  GUIDE 05/29/2022 [facility]   BREAST BIOPSY Left 05/29/2022   US  LT BREAST BX W LOC DEV EA ADD LESION IMG BX SPEC US  GUIDE 05/29/2022 [facility]   BREAST BIOPSY Left 06/06/2022   MM LT BREAST BX W LOC DEV 1ST LESION IMAGE BX SPEC STEREO GUIDE 06/06/2022 [facility]   BREAST BIOPSY  11/19/2022   MM LT RADIOACTIVE SEED LOC MAMMO GUIDE 11/19/2022 [facility]   BREAST BIOPSY  11/19/2022   MM LT RADIOACTIVE SEED EA ADD LESION LOC MAMMO GUIDE 11/19/2022 [facility]   BREAST BIOPSY  11/19/2022   MM LT RADIOACTIVE SEED EA ADD LESION LOC MAMMO GUIDE 11/19/2022 [facility]   BREAST LUMPECTOMY WITH RADIOACTIVE SEED AND AXILLARY LYMPH NODE DISSECTION Left 11/20/2022   Procedure: LEFT BREAST LUMPECTOMY WITH RADIOACTIVE SEED;  Surgeon: Ebbie Cough, MD;  Location: [facility] OR;  Service: General;  Laterality: Left;  PEC BLOCK   CESAREAN SECTION  2015   PORT-A-CATH REMOVAL Right 11/20/2022   Procedure: REMOVAL PORT-A-CATH;  Surgeon: Ebbie Cough, MD;  Location: [facility] OR;  Service: General;  Laterality: Right;   PORTACATH PLACEMENT N/A 06/19/2022   Procedure: INSERTION PORT-A-CATH;  Surgeon: Ebbie Cough, MD;  Location: [facility] OR;  Service: General;  Laterality: N/A;   TONSILLECTOMY     As a child    Current Medications: Current Meds  Medication Sig   abemaciclib  (VERZENIO ) 100 MG tablet Take 1 tablet (100 mg total) by mouth  2 (two) times daily.   ALPRAZolam  (XANAX ) 0.5 MG tablet Take 1 tablet (0.5 mg total) by mouth at bedtime as needed for anxiety.   aspirin EC 81 MG tablet Take 81 mg by mouth daily. Swallow whole.   aspirin-acetaminophen -caffeine (EXCEDRIN MIGRAINE) 250-250-65 MG tablet Take 1-2 tablets by mouth 2 (two) times daily as needed for headache or migraine.   Blood Glucose Monitoring Suppl (ACCU-CHEK AVIVA PLUS) w/Device KIT Use to check blood sugar once daily at 6 (six) AM.   carvedilol  (COREG ) 25 MG tablet Take 1 tablet (25 mg total) by mouth 2 (two) times daily with a meal.   cholecalciferol (VITAMIN D3) 25 MCG (1000 UNIT) tablet Take 1,000 Units by mouth daily.   esomeprazole (NEXIUM) 20 MG  capsule Take 20 mg by mouth daily at 12 noon.   gabapentin  (NEURONTIN ) 100 MG capsule TAKE 1 CAPSULE(100 MG) BY MOUTH THREE TIMES DAILY   glucose blood (ACCU-CHEK AVIVA PLUS) test strip Use as instructed   goserelin (ZOLADEX ) 3.6 MG injection Inject 3.6 mg into the skin every 28 (twenty-eight) days.   Lancets (ONETOUCH DELICA PLUS LANCET33G) MISC Use to check blood sugar once a day at 6 am.   letrozole  (FEMARA ) 2.5 MG tablet Take 1 tablet (2.5 mg total) by mouth daily.   lisinopril -hydrochlorothiazide  (ZESTORETIC ) 10-12.5 MG tablet Take 1 tablet by mouth daily.   ondansetron  (ZOFRAN ) 8 MG tablet Take 1 tablet (8 mg total) by mouth every 8 (eight) hours as needed for nausea or vomiting.   ONE TOUCH CLUB LANCETS MISC 1 each by Does not apply route daily.   prochlorperazine  (COMPAZINE ) 10 MG tablet Take 1 tablet (10 mg total) by mouth every 6 (six) hours as needed for nausea or vomiting.   Vitamin D , Ergocalciferol , (DRISDOL ) 1.25 MG (50000 UNIT) CAPS capsule Take 1 capsule (50,000 Units total) by mouth every 7 (seven) days.     Allergies:   Macrobid [nitrofurantoin macrocrystal] and Ibuprofen   Social History   Socioeconomic History   Marital status: Married    Spouse name: Not on file   Number of children: 1   Years of education: some college   Highest education level: Associate degree: occupational, Scientist, product/process development, or vocational program  Occupational History   Not on file  Tobacco Use   Smoking status: Former    Current packs/day: 0.00    Average packs/day: 1 pack/day for 7.0 years (7.0 ttl pk-yrs)    Types: Cigarettes, E-cigarettes    Start date: 03/20/2007    Quit date: 10/29/2021    Years since quitting: 2.2   Smokeless tobacco: Never   Tobacco comments:    Smoked cigarettes for 7 years, quit in 2015. Started vaping 2020 and just quit in July this year.  Vaping Use   Vaping status: Former   Quit date: 04/03/2019  Substance and Sexual Activity   Alcohol use: Not Currently     Comment: Only on special occasions   Drug use: Yes    Frequency: 1.0 times per week    Types: Marijuana   Sexual activity: Yes    Birth control/protection: Condom    Comment: Also on Zoladex  implant for [facility]  Other Topics Concern   Not on file  Social History Narrative      Social Drivers of Health   Financial Resource Strain: Medium Risk (10/08/2023)   Overall Financial Resource Strain (CARDIA)    Difficulty of Paying Living Expenses: Somewhat hard  Food Insecurity: Food Insecurity Present (10/08/2023)   Hunger Vital  Sign    Worried About Programme researcher, broadcasting/film/video in the Last Year: Sometimes true    Ran Out of Food in the Last Year: Sometimes true  Transportation Needs: No Transportation Needs (10/08/2023)   PRAPARE - Administrator, Civil Service (Medical): No    Lack of Transportation (Non-Medical): No  Physical Activity: Insufficiently Active (10/08/2023)   Exercise Vital Sign    Days of Exercise per Week: 1 day    Minutes of Exercise per Session: 10 min  Stress: Stress Concern Present (10/08/2023)   [employer] of Occupational Health - Occupational Stress Questionnaire    Feeling of Stress: To some extent  Social Connections: Socially Integrated (10/08/2023)   Social Connection and Isolation Panel    Frequency of Communication with Friends and Family: Twice a week    Frequency of Social Gatherings with Friends and Family: Once a week    Attends Religious Services: More than 4 times per year    Active Member of [employer] or Organizations: Yes    Attends Engineer, structural: More than 4 times per year    Marital Status: Married     Family History: The patient's family history includes Alcohol abuse in her maternal grandfather, maternal grandmother, and sister; Anemia in her mother; Anxiety disorder in her father, mother, sister, and sister; Arthritis in her maternal grandfather, maternal grandmother, mother, paternal grandfather, paternal grandmother, sister, and  sister; Asthma in her mother; Bladder Cancer in her maternal aunt and mother; COPD in her father, maternal grandmother, and mother; Colon cancer (age of onset: 67 4 59) in her maternal aunt; Depression in her mother, sister, and sister; Diabetes in her maternal grandmother, mother, paternal grandmother, sister, and sister; Early death in her father, maternal grandfather, and mother; Hearing loss in her maternal grandmother and paternal grandfather; Heart attack in her maternal grandmother, paternal grandfather, and paternal grandmother; Heart attack (age of onset: 77) in her sister; Heart attack (age of onset: 56) in her father; Heart attack (age of onset: 86) in her mother; Heart attack (age of onset: 45) in her maternal grandfather; Heart disease in her father, maternal grandfather, maternal grandmother, mother, paternal grandfather, paternal grandmother, and sister; Heart failure in her maternal grandfather, maternal grandmother, mother, paternal grandfather, and paternal grandmother; Hyperlipidemia in her father, maternal grandfather, maternal grandmother, mother, paternal grandfather, and paternal grandmother; Hypertension in her father, maternal grandfather, maternal grandmother, mother, paternal grandfather, paternal grandmother, sister, and sister; Intellectual disability in her paternal grandmother; Kidney disease in her mother, paternal grandmother, and sister; Lung cancer (age of onset: 28) in her paternal aunt; Melanoma (age of onset: 47 - 61) in her paternal aunt; Mental illness in her father, maternal grandfather, maternal grandmother, mother, paternal grandfather, paternal grandmother, and sister; Miscarriages / Stillbirths in her maternal grandmother, paternal grandmother, and sister; Obesity in her father, maternal grandmother, mother, paternal grandmother, sister, and sister; Other (age of onset: 38 - 48) in her maternal aunt; Stroke in her maternal grandmother, mother, and sister; Transient  ischemic attack in her sister; Varicose Veins in her father, mother, and sister. There is no history of Breast cancer, Prostate cancer, Ovarian cancer, Endometrial cancer, or Pancreatic cancer. ROS:   Please see the history of present illness.    All 14 point review of systems negative except as described per history of present illness  EKGs/Labs/Other Studies Reviewed:         Recent Labs: 10/08/2023: TSH 2.15 12/09/2023: Magnesium 1.9 12/30/2023: ALT 27; BUN 13;  Creatinine 0.80; Hemoglobin 10.9; Platelet Count 225; Potassium 4.0; Sodium 134  Recent Lipid Panel    Component Value Date/Time   CHOL 148 10/08/2023 1034   TRIG 148.0 10/08/2023 1034   HDL 54.80 10/08/2023 1034   CHOLHDL 3 10/08/2023 1034   VLDL 29.6 10/08/2023 1034   LDLCALC 64 10/08/2023 1034    Physical Exam:    VS:  BP 112/70   Pulse 88   Ht 5' 7 (1.702 m)   Wt (!) 326 lb (147.9 kg)   SpO2 97%   BMI 51.06 kg/m     Wt Readings from Last 3 Encounters:  01/22/24 (!) 326 lb (147.9 kg)  12/30/23 (!) 327 lb 12.8 oz (148.7 kg)  12/09/23 (!) 324 lb (147 kg)     GEN:  Well nourished, well developed in no acute distress HEENT: Normal NECK: No JVD; No carotid bruits LYMPHATICS: No lymphadenopathy CARDIAC: RRR, no murmurs, no rubs, no gallops RESPIRATORY:  Clear to auscultation without rales, wheezing or rhonchi  ABDOMEN: Soft, non-tender, non-distended MUSCULOSKELETAL:  No edema; No deformity  SKIN: Warm and dry LOWER EXTREMITIES: no swelling NEUROLOGIC:  Alert and oriented x 3 PSYCHIATRIC:  Normal affect   ASSESSMENT:    1. Essential hypertension   2. LVH (left ventricular hypertrophy) due to hypertensive disease, without heart failure   3. Type 2 diabetes mellitus without complication, without long-term current use of insulin  (HCC)   4. Malignant neoplasm of female breast, unspecified estrogen receptor status, unspecified laterality, unspecified site of breast (HCC)   5. Atypical chest pain    PLAN:     In order of problems listed above:  Chest pain very atypical not cardiac related coronary calcium score 0.  Continue present management with risk modifications. Echocardiogram showed left ventricular hypertrophy will repeat echocardiogram. Breast cancer noted status post surgery chemotherapy. Dyslipidemia I did review KPN her cholesterol is excellent and actually with no medications LDL 64 HDL 54 I will continue present management   Medication Adjustments/Labs and Tests Ordered: Current medicines are reviewed at length with the patient today.  Concerns regarding medicines are outlined above.  No orders of the defined types were placed in this encounter.  Medication changes: No orders of the defined types were placed in this encounter.   Signed, Lamar DOROTHA Fitch, MD, [facility] 01/22/2024 2:24 PM    [facility]

## 2024-01-22 NOTE — Patient Instructions (Signed)
 Medication Instructions:  Your physician recommends that you continue on your current medications as directed. Please refer to the Current Medication list given to you today.  *If you need a refill on your cardiac medications before your next appointment, please call your pharmacy*   Lab Work: None Ordered If you have labs (blood work) drawn today and your tests are completely normal, you will receive your results only by: MyChart Message (if you have MyChart) OR A paper copy in the mail If you have any lab test that is abnormal or we need to change your treatment, we will call you to review the results.   Testing/Procedures: Your physician has requested that you have an echocardiogram. Echocardiography is a painless test that uses sound waves to create images of your heart. It provides your doctor with information about the size and shape of your heart and how well your heart's chambers and valves are working. This procedure takes approximately one hour. There are no restrictions for this procedure. Please do NOT wear cologne, perfume, aftershave, or lotions (deodorant is allowed). Please arrive 15 minutes prior to your appointment time.  Please note: We ask at that you not bring children with you during ultrasound (echo/ vascular) testing. Due to room size and safety concerns, children are not allowed in the ultrasound rooms during exams. Our front office staff cannot provide observation of children in our lobby area while testing is being conducted. An adult accompanying a patient to their appointment will only be allowed in the ultrasound room at the discretion of the ultrasound technician under special circumstances. We apologize for any inconvenience.    Follow-Up: At [facility], you and your health needs are our priority.  As part of our continuing mission to provide you with exceptional heart care, we have created designated Provider Care Teams.  These Care Teams include your  primary Cardiologist (physician) and Advanced Practice Providers (APPs -  Physician Assistants and Nurse Practitioners) who all work together to provide you with the care you need, when you need it.  We recommend signing up for the patient portal called MyChart.  Sign up information is provided on this After Visit Summary.  MyChart is used to connect with patients for Virtual Visits (Telemedicine).  Patients are able to view lab/test results, encounter notes, upcoming appointments, etc.  Non-urgent messages can be sent to your provider as well.   To learn more about what you can do with MyChart, go to [email or web address].    Your next appointment:   12 month(s)  The format for your next appointment:   In Person  Provider:   Lamar Fitch, MD    Other Instructions NA

## 2024-01-22 NOTE — Addendum Note (Signed)
 Addended by: ARLOA PLANAS D on: 01/22/2024 02:35 PM   Modules accepted: Orders

## 2024-01-23 ENCOUNTER — Other Ambulatory Visit: Payer: Self-pay | Admitting: Cardiology

## 2024-01-23 ENCOUNTER — Other Ambulatory Visit (HOSPITAL_COMMUNITY): Payer: Self-pay

## 2024-01-23 ENCOUNTER — Other Ambulatory Visit: Payer: Self-pay

## 2024-01-23 DIAGNOSIS — R0789 Other chest pain: Secondary | ICD-10-CM

## 2024-01-23 DIAGNOSIS — C50919 Malignant neoplasm of unspecified site of unspecified female breast: Secondary | ICD-10-CM

## 2024-01-23 DIAGNOSIS — R0602 Shortness of breath: Secondary | ICD-10-CM

## 2024-01-23 DIAGNOSIS — I119 Hypertensive heart disease without heart failure: Secondary | ICD-10-CM

## 2024-01-23 DIAGNOSIS — I10 Essential (primary) hypertension: Secondary | ICD-10-CM

## 2024-01-23 DIAGNOSIS — E119 Type 2 diabetes mellitus without complications: Secondary | ICD-10-CM

## 2024-01-23 MED ORDER — ALPRAZOLAM 0.5 MG PO TABS
0.5000 mg | ORAL_TABLET | Freq: Every evening | ORAL | 3 refills | Status: AC | PRN
  Filled 2024-01-23 – 2024-02-04 (×2): qty 30, 30d supply, fill #0
  Filled 2024-03-18: qty 30, 30d supply, fill #1
  Filled 2024-05-07: qty 30, 30d supply, fill #2

## 2024-01-27 ENCOUNTER — Encounter (INDEPENDENT_AMBULATORY_CARE_PROVIDER_SITE_OTHER): Payer: Self-pay

## 2024-01-28 ENCOUNTER — Other Ambulatory Visit: Payer: Self-pay | Admitting: Nurse Practitioner

## 2024-01-28 ENCOUNTER — Ambulatory Visit (INDEPENDENT_AMBULATORY_CARE_PROVIDER_SITE_OTHER): Admitting: Nurse Practitioner

## 2024-01-28 ENCOUNTER — Encounter (INDEPENDENT_AMBULATORY_CARE_PROVIDER_SITE_OTHER): Payer: Self-pay | Admitting: Nurse Practitioner

## 2024-01-28 VITALS — BP 121/76 | HR 83 | Temp 97.8°F | Ht 66.0 in | Wt 323.0 lb

## 2024-01-28 DIAGNOSIS — E119 Type 2 diabetes mellitus without complications: Secondary | ICD-10-CM

## 2024-01-28 DIAGNOSIS — I10 Essential (primary) hypertension: Secondary | ICD-10-CM | POA: Diagnosis not present

## 2024-01-28 DIAGNOSIS — C50812 Malignant neoplasm of overlapping sites of left female breast: Secondary | ICD-10-CM

## 2024-01-28 DIAGNOSIS — R000 Tachycardia, unspecified: Secondary | ICD-10-CM | POA: Diagnosis not present

## 2024-01-28 DIAGNOSIS — E559 Vitamin D deficiency, unspecified: Secondary | ICD-10-CM

## 2024-01-28 DIAGNOSIS — Z0289 Encounter for other administrative examinations: Secondary | ICD-10-CM

## 2024-01-28 DIAGNOSIS — Z6841 Body Mass Index (BMI) 40.0 and over, adult: Secondary | ICD-10-CM

## 2024-01-28 DIAGNOSIS — Z7985 Long-term (current) use of injectable non-insulin antidiabetic drugs: Secondary | ICD-10-CM

## 2024-01-28 DIAGNOSIS — E66813 Obesity, class 3: Secondary | ICD-10-CM

## 2024-01-28 DIAGNOSIS — Z170 Estrogen receptor positive status [ER+]: Secondary | ICD-10-CM

## 2024-01-28 NOTE — Progress Notes (Signed)
 [address] [place], [place], [place] [address] Office: [phone]  /  Fax: [phone]   Initial Consultation    Lynn [name] was seen in clinic today to evaluate for obesity. She is interested in losing weight to improve overall health and reduce the risk of weight related complications. She presents today to review program treatment options, initial physical assessment, and evaluation.    Breast cancer of left breast - chemo, surgery(lumpectomy and 15 lymph nodes) then radiation in 2024. She is currently on Letrozole  2.5 mg every day. Verzenio  100 mg BID for 2 years and zoladex  3.6 mg inject once a month for at least 2 years  Since July she has lost 25 pounds. She is just now getting her strength back up. No genetic markers for cancer.   She does have essential hypertension and chronic tachycardia- was having resting heartrate of 140- takes carvedilol  25 mg BID and is well controlled. She is Type 2 DM and was well controlled for 3 years until she developed projectile vomiting s/p chemo/surgery/radiation.  Unable to tolerate Metformin . She is willing to try Mounjaro  and has been recommended by her cardiologist who referred her to our office. Anthropometrics and Bioimpedance Analysis   Body mass index is 52.3 kg/m. Body Fat Mass : 56.4 % Visceral Fat Mass Rating : 20   Obesity Related Diseases and Complications  Obesity Quality of Life and Psychosocial Complications: Body image dissatisfaction, Reduced health-related quality of life, and Decrease physical activity and social participation  Cardiometabolic: Prediabetes and/or insulin  resistance, Hypertension, DOE, and Fatigue  Biomechanical: Low back pain and GERD   Weight Related History  She was referred by: Specialist  When asked what they would like to accomplish? She states: Adopt a healthier eating pattern and lifestyle, Improve energy levels and physical activity, Improve existing medical conditions, Improve quality of  life, Improve appearance, and Improve self-confidence  Weight history: She started being overweight as a young child, worse in puberty.. She did lose her pregnancy weight.   Highest weight: 405  Contributing factors: family history of obesity, consumption of processed foods, use of obesogenic medications: Beta-blockers, Psychotropic medications, and Contraceptives or hormonal therapy, moderate to high levels of stress, menopause, strong orexigenic signaling and/or inadequate inhibitory control , need for convenience due to lack of time, multiple weight loss attempts in the past, sedentary job, hectic pace of life, need for convenient foods, and self - critic or all-or-none mindset  Prior weight loss attempts: Weight Watchers  Current or previous pharmacotherapy: GLP-1  Response to medication: Had side effects so it was discontinued  Current nutrition plan: None  Greatest challenge with dieting: dieting fatigue, difficulty maintaining reduced calorie state, and meal preparation and cooking.  Current level of physical activity: Walking 20 minutes, seven a week  Barriers to Exercise: time, energy, and orthopedic problems  Readiness and Motivation  On a scale from 0 to 10 How ready are you to make changes to your eating and physical activity to lose weight? 10 How important is it for you to lose weight right now ? 10 How confident are you that you can lose weight if you try? 10  Past Medical History   Past Medical History:  Diagnosis Date   Anemia    Breast cancer (HCC) 02/28   Cancer (HCC) 2024   L breast   Carpal tunnel syndrome, left    Depression    Diabetes (HCC)    Dysrhythmia    Palpitations   GERD (gastroesophageal reflux disease)  Hypertension    Iron deficiency    Panic attacks    PCOS (polycystic ovarian syndrome)    Personal history of chemotherapy    Pneumonia    Port-A-Cath in place 07/31/2022   Tachycardia    Umbilical hernia      Objective    BP  121/76   Pulse 83   Temp 97.8 F (36.6 C)   Ht 5' 6 (1.676 m)   Wt (!) 323 lb (146.5 kg)   SpO2 98%   BMI 52.13 kg/m  She was weighed on the bioimpedance scale: Body mass index is 52.13 kg/m.    General:  Alert, oriented and cooperative. Patient is in no acute distress.  Respiratory: Normal respiratory effort, no problems with respiration noted   Gait: able to ambulate independently  Mental Status: Normal mood and affect. Normal behavior. Normal judgment and thought content.   Diagnostic Data Reviewed  BMET    Component Value Date/Time   NA 134 (L) 12/30/2023 1356   K 4.0 12/30/2023 1356   CL 99 12/30/2023 1356   CO2 30 12/30/2023 1356   GLUCOSE 255 (H) 12/30/2023 1356   BUN 13 12/30/2023 1356   CREATININE 0.80 12/30/2023 1356   CALCIUM 9.0 12/30/2023 1356   GFRNONAA >60 12/30/2023 1356   GFRAA >60 01/02/2020 2048   Lab Results  Component Value Date   HGBA1C 5.8 10/08/2023   HGBA1C 7.0 (A) 10/13/2020   No results found for: INSULIN  CBC    Component Value Date/Time   WBC 5.4 12/30/2023 1356   WBC 6.3 11/15/2022 0822   RBC 3.82 (L) 12/30/2023 1356   HGB 10.9 (L) 12/30/2023 1356   HCT 31.1 (L) 12/30/2023 1356   PLT 225 12/30/2023 1356   MCV 81.4 12/30/2023 1356   MCH 28.5 12/30/2023 1356   MCHC 35.0 12/30/2023 1356   RDW 14.2 12/30/2023 1356   Iron/TIBC/Ferritin/ %Sat    Component Value Date/Time   IRON 73 07/11/2023 0855   FERRITIN 58.0 07/11/2023 0855   Lipid Panel     Component Value Date/Time   CHOL 148 10/08/2023 1034   TRIG 148.0 10/08/2023 1034   HDL 54.80 10/08/2023 1034   CHOLHDL 3 10/08/2023 1034   VLDL 29.6 10/08/2023 1034   LDLCALC 64 10/08/2023 1034   Hepatic Function Panel     Component Value Date/Time   PROT 7.2 12/30/2023 1356   ALBUMIN 4.0 12/30/2023 1356   AST 20 12/30/2023 1356   ALT 27 12/30/2023 1356   ALKPHOS 80 12/30/2023 1356   BILITOT 0.5 12/30/2023 1356      Component Value Date/Time   TSH 2.15 10/08/2023 1034     Medications  Outpatient Encounter Medications as of 01/28/2024  Medication Sig   abemaciclib  (VERZENIO ) 100 MG tablet Take 1 tablet (100 mg total) by mouth 2 (two) times daily.   ALPRAZolam  (XANAX ) 0.5 MG tablet Take 1 tablet (0.5 mg total) by mouth at bedtime as needed for anxiety.   aspirin EC 81 MG tablet Take 81 mg by mouth daily. Swallow whole.   aspirin-acetaminophen -caffeine (EXCEDRIN MIGRAINE) 250-250-65 MG tablet Take 1-2 tablets by mouth 2 (two) times daily as needed for headache or migraine.   Blood Glucose Monitoring Suppl (ACCU-CHEK AVIVA PLUS) w/Device KIT Use to check blood sugar once daily at 6 (six) AM.   carvedilol  (COREG ) 25 MG tablet Take 1 tablet (25 mg total) by mouth 2 (two) times daily with a meal.   cholecalciferol (VITAMIN D3) 25 MCG (1000 UNIT) tablet  Take 1,000 Units by mouth daily.   esomeprazole (NEXIUM) 20 MG capsule Take 20 mg by mouth daily at 12 noon.   gabapentin  (NEURONTIN ) 100 MG capsule TAKE 1 CAPSULE(100 MG) BY MOUTH THREE TIMES DAILY   glucose blood (ACCU-CHEK AVIVA PLUS) test strip Use as instructed   goserelin (ZOLADEX ) 3.6 MG injection Inject 3.6 mg into the skin every 28 (twenty-eight) days.   Lancets (ONETOUCH DELICA PLUS LANCET33G) MISC Use to check blood sugar once a day at 6 am.   letrozole  (FEMARA ) 2.5 MG tablet Take 1 tablet (2.5 mg total) by mouth daily.   lisinopril -hydrochlorothiazide  (ZESTORETIC ) 10-12.5 MG tablet Take 1 tablet by mouth daily.   ondansetron  (ZOFRAN ) 8 MG tablet Take 1 tablet (8 mg total) by mouth every 8 (eight) hours as needed for nausea or vomiting.   ONE TOUCH CLUB LANCETS MISC 1 each by Does not apply route daily.   prochlorperazine  (COMPAZINE ) 10 MG tablet Take 1 tablet (10 mg total) by mouth every 6 (six) hours as needed for nausea or vomiting.   Vitamin D , Ergocalciferol , (DRISDOL ) 1.25 MG (50000 UNIT) CAPS capsule Take 1 capsule (50,000 Units total) by mouth every 7 (seven) days.   No facility-administered  encounter medications on file as of 01/28/2024.     Assessment and Plan   Chronic tachycardia Essential hypertension Continue carvedilol  25 mg BID  Monitor BP and if consistently >140/90 notify PCP If develops headaches, chest pain, shortness of breath or dizziness go to ER Loss of 10-15% body weight can help improve blood pressures   Type 2 diabetes mellitus without complication, without long-term current use of insulin  (HCC) Had previously been well controlled with Ozempic  2 mg x 3 years then started having projectile vomiting, unable to take Metformin  due to severe diarrhea.  She is interested in trying Mounjaro - did not have allergic reaction but severe side effect with ozempic . Decreasing body weight by 10-15% can improve glucose levels  Malignant neoplasm of overlapping sites of left breast in female, estrogen receptor positive (HCC)       Continue to follow with Dr. Marykay and take letrozole , verzenio  and zoladex  as directed  Vitamin D  deficiency       Has Vit D deficiency and is currently on ergocalciferol  50000 units once a week - no side effects  Class 3 severe obesity with serious comorbidity and body mass index (BMI) of 50.0 to 59.9 in adult, unspecified obesity type (HCC)  Obesity Treatment and Action Plan:  Patient will work on garnering support from family and friends to begin weight loss journey. Will work on eliminating or reducing the presence of highly palatable, calorie dense foods in the home. Will complete provided nutritional and psychosocial assessment questionnaire before the next appointment. Will be scheduled for indirect calorimetry to determine resting energy expenditure in a fasting state.  This will allow us  to create a reduced calorie, high-protein meal plan to promote loss of fat mass while preserving muscle mass. Counseled on the health benefits of losing 5%-15% of total body weight. Was counseled on nutritional approaches to weight loss and benefits  of reducing processed foods and consuming plant-based foods and high quality protein as part of nutritional weight management. Was counseled on pharmacotherapy and role as an adjunct in weight management.   Education and Additional resources  She was weighed on the bioimpedance scale and results were discussed and documented in the synopsis.  We discussed obesity as a progressive, chronic disease and the importance of a more detailed  evaluation of all the factors contributing to the disease.  We reviewed the basic principles in obesity management.   We discussed the importance of long term lifestyle changes which include nutrition, exercise and behavioral modification as well as the importance of customizing this to her specific health and social needs.  We reviewed the role of medical interventions including pharmacotherapy and surgical interventions.   We discussed the benefits of reaching a healthier weight to alleviate the symptoms of existing conditions and reduce the risks of the biomechanical, cardiometabolic and psychological effects of obesity.  We reviewed our program approach and philosophy, which are guided by the four pillars of obesity medicine.  We discussed how to prepare for intake appointment and the importance of fasting and avoidance of stimulants for at least 8 hours prior to indirect calorimetry.  Lynn [name] appears to be in the action stage of change and reports being ready to initiate intensive lifestyle and behavioral modifications as part of their weight loss journey.  Attestation  Reviewed by clinician on day of visit: allergies, medications, problem list, medical history, surgical history, family history, social history, and previous encounter notes pertinent to obesity diagnosis.  I personally spent a total of 37 minutes in the care of the patient today including preparing to see the patient, getting/reviewing separately obtained history, performing a  medically appropriate exam/evaluation, counseling and educating, and documenting clinical information in the EHR.   Daksha Koone ANP-C

## 2024-01-29 ENCOUNTER — Other Ambulatory Visit: Payer: Self-pay

## 2024-01-29 ENCOUNTER — Other Ambulatory Visit (HOSPITAL_BASED_OUTPATIENT_CLINIC_OR_DEPARTMENT_OTHER): Payer: Self-pay

## 2024-01-29 ENCOUNTER — Inpatient Hospital Stay: Attending: Hematology and Oncology

## 2024-01-29 VITALS — BP 118/72 | HR 96 | Temp 98.2°F | Resp 18

## 2024-01-29 DIAGNOSIS — C50812 Malignant neoplasm of overlapping sites of left female breast: Secondary | ICD-10-CM | POA: Insufficient documentation

## 2024-01-29 DIAGNOSIS — Z170 Estrogen receptor positive status [ER+]: Secondary | ICD-10-CM | POA: Diagnosis not present

## 2024-01-29 DIAGNOSIS — Z1721 Progesterone receptor positive status: Secondary | ICD-10-CM | POA: Diagnosis not present

## 2024-01-29 DIAGNOSIS — Z5111 Encounter for antineoplastic chemotherapy: Secondary | ICD-10-CM | POA: Diagnosis not present

## 2024-01-29 DIAGNOSIS — Z1732 Human epidermal growth factor receptor 2 negative status: Secondary | ICD-10-CM | POA: Insufficient documentation

## 2024-01-29 MED ORDER — VITAMIN D (ERGOCALCIFEROL) 1.25 MG (50000 UNIT) PO CAPS
50000.0000 [IU] | ORAL_CAPSULE | ORAL | 0 refills | Status: DC
  Filled 2024-01-29: qty 12, 84d supply, fill #0

## 2024-01-29 MED ORDER — GOSERELIN ACETATE 3.6 MG ~~LOC~~ IMPL
3.6000 mg | DRUG_IMPLANT | Freq: Once | SUBCUTANEOUS | Status: AC
  Administered 2024-01-29: 3.6 mg via SUBCUTANEOUS
  Filled 2024-01-29: qty 3.6

## 2024-01-30 ENCOUNTER — Ambulatory Visit (INDEPENDENT_AMBULATORY_CARE_PROVIDER_SITE_OTHER): Admitting: Nurse Practitioner

## 2024-01-30 ENCOUNTER — Encounter: Payer: Self-pay | Admitting: Nurse Practitioner

## 2024-01-30 VITALS — BP 124/72 | HR 96 | Temp 97.6°F | Ht 66.0 in | Wt 327.8 lb

## 2024-01-30 DIAGNOSIS — M79674 Pain in right toe(s): Secondary | ICD-10-CM

## 2024-01-30 NOTE — Progress Notes (Signed)
 Acute Office Visit  Subjective:    Patient ID: Lynn [name], female    DOB: Dec 19, 1985, 38 y.o.   MRN: [id number]  Chief Complaint  Patient presents with   Toe Pain    X 4days   HPI Patient is in today for right great toe pain x 4days. Her husband trimmed an ingrown toenail 2days ago. She denies any toe or foot injury. Hx of DIABETES with neuropathy.  Outpatient Medications Prior to Visit  Medication Sig   abemaciclib  (VERZENIO ) 100 MG tablet Take 1 tablet (100 mg total) by mouth 2 (two) times daily.   ALPRAZolam  (XANAX ) 0.5 MG tablet Take 1 tablet (0.5 mg total) by mouth at bedtime as needed for anxiety.   aspirin EC 81 MG tablet Take 81 mg by mouth daily. Swallow whole.   aspirin-acetaminophen -caffeine (EXCEDRIN MIGRAINE) 250-250-65 MG tablet Take 1-2 tablets by mouth 2 (two) times daily as needed for headache or migraine.   Blood Glucose Monitoring Suppl (ACCU-CHEK AVIVA PLUS) w/Device KIT Use to check blood sugar once daily at 6 (six) AM.   carvedilol  (COREG ) 25 MG tablet Take 1 tablet (25 mg total) by mouth 2 (two) times daily with a meal.   cholecalciferol (VITAMIN D3) 25 MCG (1000 UNIT) tablet Take 1,000 Units by mouth daily.   esomeprazole (NEXIUM) 20 MG capsule Take 20 mg by mouth daily at 12 noon.   gabapentin  (NEURONTIN ) 100 MG capsule TAKE 1 CAPSULE(100 MG) BY MOUTH THREE TIMES DAILY   glucose blood (ACCU-CHEK AVIVA PLUS) test strip Use as instructed   goserelin (ZOLADEX ) 3.6 MG injection Inject 3.6 mg into the skin every 28 (twenty-eight) days.   Lancets (ONETOUCH DELICA PLUS LANCET33G) MISC Use to check blood sugar once a day at 6 am.   letrozole  (FEMARA ) 2.5 MG tablet Take 1 tablet (2.5 mg total) by mouth daily.   lisinopril -hydrochlorothiazide  (ZESTORETIC ) 10-12.5 MG tablet Take 1 tablet by mouth daily.   ondansetron  (ZOFRAN ) 8 MG tablet Take 1 tablet (8 mg total) by mouth every 8 (eight) hours as needed for nausea or vomiting.   ONE TOUCH CLUB LANCETS MISC 1 each  by Does not apply route daily.   prochlorperazine  (COMPAZINE ) 10 MG tablet Take 1 tablet (10 mg total) by mouth every 6 (six) hours as needed for nausea or vomiting.   Vitamin D , Ergocalciferol , (DRISDOL ) 1.25 MG (50000 UNIT) CAPS capsule Take 1 capsule (50,000 Units total) by mouth every 7 (seven) days.   No facility-administered medications prior to visit.    Reviewed past medical and social history.  Review of Systems Per HPI     Objective:    Physical Exam Vitals and nursing note reviewed.  Cardiovascular:     Pulses:          Dorsalis pedis pulses are 2+ on the right side and 2+ on the left side.       Posterior tibial pulses are 2+ on the right side and 2+ on the left side.  Musculoskeletal:     Right foot: Normal range of motion. No Charcot foot or prominent metatarsal heads.     Left foot: Normal range of motion. No Charcot foot or prominent metatarsal heads.       Feet:  Feet:     Right foot:     Skin integrity: No skin breakdown or fissure.     Toenail Condition: Right toenails are normal.     Left foot:     Skin integrity: Skin integrity normal.  Toenail Condition: Left toenails are normal.     BP 124/72 (BP Location: Left Arm, Patient Position: Sitting, Cuff Size: Large)   Pulse 96   Temp 97.6 F (36.4 C) (Temporal)   Ht 5' 6 (1.676 m)   Wt (!) 327 lb 12.8 oz (148.7 kg)   SpO2 96%   BMI 52.91 kg/m    No results found for any visits on 01/30/24.     Assessment & Plan:   Problem List Items Addressed This Visit   None Visit Diagnoses       Pain around toenail, right foot    -  Primary   Relevant Orders   Ambulatory referral to Podiatry     Soak foot in warm water and epsom salt 2-3x/day. Continue proper foot care. Call office if any swelling and worsening redness.  No orders of the defined types were placed in this encounter.  Return if symptoms worsen or fail to improve.  Roselie Mood, NP

## 2024-01-30 NOTE — Patient Instructions (Signed)
 Soak foot in warm water and epsom salt 2-3x/day. Continue proper foot care. Call office if any swelling and worsening redness.

## 2024-02-03 ENCOUNTER — Other Ambulatory Visit: Payer: Self-pay | Admitting: Pharmacy Technician

## 2024-02-03 ENCOUNTER — Other Ambulatory Visit: Payer: Self-pay

## 2024-02-03 ENCOUNTER — Encounter (INDEPENDENT_AMBULATORY_CARE_PROVIDER_SITE_OTHER): Payer: Self-pay

## 2024-02-03 ENCOUNTER — Other Ambulatory Visit: Payer: Self-pay | Admitting: Nurse Practitioner

## 2024-02-03 ENCOUNTER — Other Ambulatory Visit (HOSPITAL_COMMUNITY): Payer: Self-pay

## 2024-02-03 DIAGNOSIS — R000 Tachycardia, unspecified: Secondary | ICD-10-CM

## 2024-02-03 DIAGNOSIS — I10 Essential (primary) hypertension: Secondary | ICD-10-CM

## 2024-02-03 MED ORDER — CARVEDILOL 25 MG PO TABS
25.0000 mg | ORAL_TABLET | Freq: Two times a day (BID) | ORAL | 0 refills | Status: DC
  Filled 2024-02-03: qty 180, 90d supply, fill #0

## 2024-02-03 MED ORDER — LISINOPRIL-HYDROCHLOROTHIAZIDE 10-12.5 MG PO TABS
1.0000 | ORAL_TABLET | Freq: Every day | ORAL | 0 refills | Status: DC
  Filled 2024-02-03: qty 90, 90d supply, fill #0

## 2024-02-03 NOTE — Progress Notes (Signed)
 Specialty Pharmacy Refill Coordination Note  Lynn [name] is a 38 y.o. female contacted today regarding refills of specialty medication(s) Abemaciclib  (VERZENIO )   Patient requested (Patient-Rptd) Delivery   Delivery date: 02/05/2024 Verified address: (Patient-Rptd) [address] [place] [address] [phone] [address]  Medication will be filled on: 02/04/2024

## 2024-02-04 ENCOUNTER — Other Ambulatory Visit (HOSPITAL_COMMUNITY): Payer: Self-pay

## 2024-02-06 ENCOUNTER — Ambulatory Visit (HOSPITAL_BASED_OUTPATIENT_CLINIC_OR_DEPARTMENT_OTHER)
Admission: RE | Admit: 2024-02-06 | Discharge: 2024-02-06 | Disposition: A | Discharge status: Home / Self Care | Source: Ambulatory Visit | Attending: Cardiology | Admitting: Cardiology

## 2024-02-06 DIAGNOSIS — R0602 Shortness of breath: Secondary | ICD-10-CM | POA: Insufficient documentation

## 2024-02-06 DIAGNOSIS — C50919 Malignant neoplasm of unspecified site of unspecified female breast: Secondary | ICD-10-CM | POA: Insufficient documentation

## 2024-02-06 DIAGNOSIS — R0789 Other chest pain: Secondary | ICD-10-CM | POA: Diagnosis present

## 2024-02-06 LAB — ECHOCARDIOGRAM COMPLETE
AR max vel: 2.6 cm2
AV Area VTI: 2.53 cm2
AV Area mean vel: 2.45 cm2
AV Mean grad: 6.5 mmHg
AV Peak grad: 11.9 mmHg
Ao pk vel: 1.73 m/s
Area-P 1/2: 5.62 cm2
Calc EF: 72.8 %
S' Lateral: 2 cm
Single Plane A2C EF: 70.2 %
Single Plane A4C EF: 76.8 %

## 2024-02-06 MED ORDER — PERFLUTREN LIPID MICROSPHERE
1.0000 mL | INTRAVENOUS | Status: AC | PRN
  Administered 2024-02-06: 2 mL via INTRAVENOUS

## 2024-02-07 ENCOUNTER — Other Ambulatory Visit: Payer: Self-pay

## 2024-02-11 ENCOUNTER — Ambulatory Visit: Payer: Self-pay | Admitting: Cardiology

## 2024-02-17 ENCOUNTER — Encounter: Payer: Self-pay | Admitting: Podiatry

## 2024-02-17 ENCOUNTER — Ambulatory Visit (INDEPENDENT_AMBULATORY_CARE_PROVIDER_SITE_OTHER): Admitting: Podiatry

## 2024-02-17 DIAGNOSIS — L600 Ingrowing nail: Secondary | ICD-10-CM

## 2024-02-17 DIAGNOSIS — B351 Tinea unguium: Secondary | ICD-10-CM

## 2024-02-17 DIAGNOSIS — M79674 Pain in right toe(s): Secondary | ICD-10-CM

## 2024-02-17 DIAGNOSIS — E1142 Type 2 diabetes mellitus with diabetic polyneuropathy: Secondary | ICD-10-CM

## 2024-02-17 DIAGNOSIS — M79675 Pain in left toe(s): Secondary | ICD-10-CM

## 2024-02-17 NOTE — Progress Notes (Signed)
  Subjective:  Patient ID: Lynn [name], female    DOB: 1985/07/29,   MRN: [id number]  Chief Complaint  Patient presents with   Diabetes    I had an ingrown.  My husband was able to cut the corner out.  I went to my doctor.  He said since I'm Diabetic I needed to see you all.  Since I had chemo, my toenails are growing weird. (Hallux bilateral)  Velton Garden, NP - 01/30/2024;  A1c 5.8    38 y.o. female presents for concern as above. Also concern of thickened elongated and painful nails that are difficult to trim. Requesting to have them trimmed today. Relates burning and tingling in their feet. Patient is diabetic and last A1c was  Lab Results  Component Value Date   HGBA1C 5.8 10/08/2023   . History of being on chemo that caused her nails to grow in.   PCP:  Nedra Tinnie LABOR, NP    . Denies any other pedal complaints. Denies n/v/f/c.   Past Medical History:  Diagnosis Date   Anemia    Breast cancer (HCC) 02/28   Cancer (HCC) 2024   L breast   Carpal tunnel syndrome, left    Depression    Diabetes (HCC)    Dysrhythmia    Palpitations   GERD (gastroesophageal reflux disease)    Hypertension    Iron deficiency    Panic attacks    PCOS (polycystic ovarian syndrome)    Personal history of chemotherapy    Pneumonia    Port-A-Cath in place 07/31/2022   Tachycardia    Umbilical hernia     Objective:  Physical Exam: Vascular: DP/PT pulses 2/4 bilateral. CFT <3 seconds. Feet cold to touch. Absent hair growth on digits. Edema noted to bilateral lower extremities. Xerosis noted bilaterally.  Skin. No lacerations or abrasions bilateral feet. Nails 1-5 bilateral  are thickened discolored and elongated with subungual debris.  Bilateral hallux nails bilateral borders incurvated and tender to palpation.  No erythema edema or purulence noted. Musculoskeletal: MMT 5/5 bilateral lower extremities in DF, PF, Inversion and Eversion. Deceased ROM in DF of ankle joint.  Neurological:  Sensation intact to light touch. Protective sensation diminished bilateral.    Assessment:   1. Pain due to onychomycosis of toenails of both feet   2. Type 2 diabetes mellitus with peripheral neuropathy (HCC)   3. Ingrown left greater toenail   4. Ingrown right greater toenail      Plan:  Patient was evaluated and treated and all questions answered. -Discussed and educated patient on diabetic foot care, especially with  regards to the vascular, neurological and musculoskeletal systems.  -Stressed the importance of good glycemic control and the detriment of not  controlling glucose levels in relation to the foot. -Discussed supportive shoes at all times and checking feet regularly.  -Mechanically debrided all nails 1-5 bilateral using sterile nail nipper and filed with dremel without incident  -Discussed ingrown toenails briefly and possibility for ingrown nail procedure in the future if needed.  Patient will consider. -Answered all patient questions -Patient to return  in 3 months for at risk foot care -Patient advised to call the office if any problems or questions arise in the meantime.   Asberry Failing, DPM

## 2024-02-18 ENCOUNTER — Ambulatory Visit (INDEPENDENT_AMBULATORY_CARE_PROVIDER_SITE_OTHER): Payer: Self-pay | Admitting: Nurse Practitioner

## 2024-02-18 ENCOUNTER — Encounter (INDEPENDENT_AMBULATORY_CARE_PROVIDER_SITE_OTHER): Payer: Self-pay | Admitting: Nurse Practitioner

## 2024-02-18 VITALS — BP 122/68 | HR 99 | Temp 98.0°F | Ht 66.5 in | Wt 326.0 lb

## 2024-02-18 DIAGNOSIS — I10 Essential (primary) hypertension: Secondary | ICD-10-CM

## 2024-02-18 DIAGNOSIS — T733XXA Exhaustion due to excessive exertion, initial encounter: Secondary | ICD-10-CM

## 2024-02-18 DIAGNOSIS — Z170 Estrogen receptor positive status [ER+]: Secondary | ICD-10-CM

## 2024-02-18 DIAGNOSIS — R0602 Shortness of breath: Secondary | ICD-10-CM | POA: Insufficient documentation

## 2024-02-18 DIAGNOSIS — E119 Type 2 diabetes mellitus without complications: Secondary | ICD-10-CM

## 2024-02-18 DIAGNOSIS — E66813 Obesity, class 3: Secondary | ICD-10-CM

## 2024-02-18 DIAGNOSIS — R5383 Other fatigue: Secondary | ICD-10-CM | POA: Diagnosis not present

## 2024-02-18 DIAGNOSIS — R000 Tachycardia, unspecified: Secondary | ICD-10-CM

## 2024-02-18 DIAGNOSIS — Z1331 Encounter for screening for depression: Secondary | ICD-10-CM | POA: Diagnosis not present

## 2024-02-18 DIAGNOSIS — Z6841 Body Mass Index (BMI) 40.0 and over, adult: Secondary | ICD-10-CM

## 2024-02-18 DIAGNOSIS — E559 Vitamin D deficiency, unspecified: Secondary | ICD-10-CM

## 2024-02-18 NOTE — Telephone Encounter (Signed)
 Patient has seen message from Dr. Bernie regarding echo, Echocardiogram showed normal left ventricular ejection fraction, overall looks good. Results routed to PCP.  Alan, RN

## 2024-02-18 NOTE — Progress Notes (Signed)
 [address] [place],  [place], [place] [address]  Office: [phone]  /  Fax: [phone]   Subjective   Initial Visit  Lynn [name] (MR# [id number]) is a 38 y.o. female who presents for evaluation and treatment of obesity and related comorbidities. Current BMI is Body mass index is 51.83 kg/m. Lynn [name] has been struggling with her weight for many years and has been unsuccessful in either losing weight, maintaining weight loss, or reaching her healthy weight goal.  Lynn [name] is currently in the action stage of change and ready to dedicate time achieving and maintaining a healthier weight. Lynn [name] is interested in becoming our patient and working on intensive lifestyle modifications including (but not limited to) diet and exercise for weight loss.  Lynn [name] did have breast cancer of left breast - chemo, surgery(lumpectomy and 15 lymph nodes) then radiation in 2024. She is currently on Letrozole  2.5 mg every day. Verzenio  100 mg BID for 2 years and zoladex  3.6 mg inject once a month for at least 2 years Since July she has lost 25 pounds. She is just now getting her strength back up. No genetic markers for cancer.    She does have essential hypertension and chronic tachycardia- was having resting heartrate of 140- takes carvedilol  25 mg BID and is well controlled. BP Readings from Last 3 Encounters:  02/18/24 122/68  01/30/24 124/72  01/29/24 118/72    She is Type 2 DM and was well controlled for 3 years on Wegovy  until she developed projectile vomiting s/p chemo/surgery/radiation.  Unable to tolerate Metformin . She is willing to try Mounjaro  and has been recommended by her cardiologist who referred her to our office  Lynn [name] has dealt with being overweight since a young child.  It did worsen after puberty.  Gained again after pregnancy and then continued to notice gradual weight gain throughout her adult years.  She was going to have a sleep study previously but then breast cancer occurred and  never had study.  She is going to her best friends house in [place]  for Thanksgiving. She is cooking . Turkey breast, sweet potato casserole, green bean casserole, dumplings, stuffing muffins, copper carrots, pumpkin pie and apple crisp.   Weight history:  When asked how their weight has affected their life and health, she states: Has affected self-esteem, Contributed to medical problems, Contributed to orthopedic problems or mobility issues, Having fatigue, Having poor endurance, and Has affected mood   When asked what else they would like to accomplish? She states: Adopt a healthier eating pattern and lifestyle, Improve energy levels and physical activity, Improve existing medical conditions, Improve quality of life, Improve appearance, and Improve self-confidence  She starting to note weight gain during : childhood.  Life events associated with weight gain include : pregnancy.   Other contributing factors: family history of obesity, consumption of processed foods, use of obesogenic medications: Beta-blockers and Contraceptives or hormonal therapy, moderate to high levels of stress, menopause, strong orexigenic signaling and/or inadequate inhibitory control , need for convenience due to lack of time, multiple weight loss attempts in the past, sedentary job, hectic pace of life, need for convenient foods, and self - critic or all-or-none mindset.  Their highest weight has been:  405 lbs.  Desired weight: 200  Previous weight-loss programs : Weight Watchers.  Their maximum weight loss was:  50 pounds but returned when stopped program  Their greatest challenge with dieting: dieting fatigue, difficulty maintaining reduced calorie state, and meal preparation and cooking.  Current or previous pharmacotherapy:  GLP-1.  Response to medication: Had side effects so it was discontinued   Nutritional History:  Current nutrition plan: None.  How many times do you eat outside the home: 2-4 per  week  How often do they skip meals: skips breakfast 1-2 times a week  What beverages do they drink: caffeinated beverages , smoothies, and unsweetened tea.   Use of artificial sweetners : Yes  Food intolerances or dislikes: salmon and dark meat of poultry.  Food triggers: Stress, Boredom, Seeking reward, When feeling guilty, To help comfort self, and When Sad.  Food cravings: Sugary, Starches / Carbohydrates, Salty, and Fast Food  Do they struggle with excessive hunger or portion control : Yes    Physical Activity:  Current level of physical activity: Walking 20 minutes, seven a week  Barriers to Exercise: energy   Past medical history includes:   Past Medical History:  Diagnosis Date   Anemia    Breast cancer (HCC) 02/28   Cancer (HCC) 2024   L breast   Carpal tunnel syndrome, left    Constipation    Depression    Diabetes (HCC)    Dysrhythmia    Palpitations   Fatty liver    GERD (gastroesophageal reflux disease)    Hypertension    Iron deficiency    Panic attacks    PCOS (polycystic ovarian syndrome)    Personal history of chemotherapy    Pneumonia    Port-A-Cath in place 07/31/2022   Tachycardia    Umbilical hernia      Objective   BP 122/68   Pulse 99   Temp 98 F (36.7 C)   Ht 5' 6.5 (1.689 m)   Wt (!) 326 lb (147.9 kg)   LMP 03/03/2023 (Approximate)   SpO2 96%   BMI 51.83 kg/m  She was weighed on the bioimpedance scale: Body mass index is 51.83 kg/m.    Anthropometrics:  Vitals Temp: 98 F (36.7 C) BP: 122/68 Pulse Rate: 99 SpO2: 96 %   Anthropometric Measurements Height: 5' 6.5 (1.689 m) Weight: (!) 326 lb (147.9 kg) BMI (Calculated): 51.84 Waist Measurement : 59 inches   Body Composition  Body Fat %: 54.7 % Fat Mass (lbs): 178.4 lbs Muscle Mass (lbs): 140.4 lbs Total Body Water (lbs): 112 lbs Visceral Fat Rating : 19   Other Clinical Data RMR: 3211 Fasting: yes Labs: yes Today's Visit #: 1 Starting Date:  02/18/24    Physical Exam:  General: She is overweight, cooperative, alert, well developed, and in no acute distress. PSYCH: Has normal mood, affect and thought process.   HEENT: EOMI, sclerae are anicteric. Lungs: Normal breathing effort, no conversational dyspnea. Extremities: No edema.  Neurologic: No gross sensory or motor deficits. No tremors or fasciculations noted.    Diagnostic Data Reviewed  EKG: Normal sinus rhythm, rate 82. No conduction abnormalities, abnormal Q waves or chamber enlargement. No changes compared to EKG 08/12/23  Indirect Calorimeter completed today shows a VO2 of 465 and a REE of 3211.  Her calculated basal metabolic rate is 7802 thus her resting energy expenditure faster than calculated.  Depression Screen  Nabilah's PHQ-9 score was: 10.     02/18/2024    8:13 AM  Depression screen PHQ 2/9  Decreased Interest 1  Down, Depressed, Hopeless 1  PHQ - 2 Score 2  Altered sleeping 1  Tired, decreased energy 2  Change in appetite 3  Feeling bad or failure about yourself  2  Trouble concentrating 0  Moving  slowly or fidgety/restless 0  Suicidal thoughts 0  PHQ-9 Score 10  Difficult doing work/chores Somewhat difficult    Screening for Sleep Related Breathing Disorders  Ada admits to daytime somnolence and denies waking up still tired. Patient has a history of symptoms of daytime fatigue. Tyreona generally gets 6-8 hours of sleep per night, and states that she has generally restful sleep. Snoring is occasionally present. Apneic episodes are present. Epworth Sleepiness Score is 4.   Last metabolic panel Lab Results  Component Value Date   GLUCOSE 255 (H) 12/30/2023   NA 134 (L) 12/30/2023   K 4.0 12/30/2023   CL 99 12/30/2023   CO2 30 12/30/2023   BUN 13 12/30/2023   CREATININE 0.80 12/30/2023   GFRNONAA >60 12/30/2023   CALCIUM 9.0 12/30/2023   PROT 7.2 12/30/2023   ALBUMIN 4.0 12/30/2023   BILITOT 0.5 12/30/2023   ALKPHOS 80  12/30/2023   AST 20 12/30/2023   ALT 27 12/30/2023   ANIONGAP 5 12/30/2023     Lab Results  Component Value Date   HGBA1C 5.8 10/08/2023   HGBA1C 7.0 (A) 10/13/2020   CBC    Component Value Date/Time   WBC 5.4 12/30/2023 1356   WBC 6.3 11/15/2022 0822   RBC 3.82 (L) 12/30/2023 1356   HGB 10.9 (L) 12/30/2023 1356   HCT 31.1 (L) 12/30/2023 1356   PLT 225 12/30/2023 1356   MCV 81.4 12/30/2023 1356   MCH 28.5 12/30/2023 1356   MCHC 35.0 12/30/2023 1356   RDW 14.2 12/30/2023 1356   Iron/TIBC/Ferritin/ %Sat    Component Value Date/Time   IRON 73 07/11/2023 0855   FERRITIN 58.0 07/11/2023 0855   Lipid Panel     Component Value Date/Time   CHOL 148 10/08/2023 1034   TRIG 148.0 10/08/2023 1034   HDL 54.80 10/08/2023 1034   CHOLHDL 3 10/08/2023 1034   VLDL 29.6 10/08/2023 1034   LDLCALC 64 10/08/2023 1034   Last vitamin D  Lab Results  Component Value Date   VD25OH 15.13 (L) 10/08/2023    Lab Results  Component Value Date   VITAMINB12 >1537 (H) 07/11/2023        Assessment and Plan   Class 3 severe obesity with serious comorbidity and body mass index (BMI) of 50.0 to 59.9 in adult, unspecified obesity type (HCC) Type 2 diabetes mellitus with morbid obesity (HCC) TREATMENT PLAN FOR OBESITY:  Recommended Dietary Goals  Vandella is currently in the action stage of change. As such, her goal is to implement medically supervised obesity management plan.  She has agreed to implement: the Category 3 plan - 1500 kcal per day  Behavioral Intervention  We discussed the following Behavioral Modification Strategies today: increasing lean protein intake to established goals, decreasing simple carbohydrates , increasing vegetables, increasing lower glycemic fruits, increasing fiber rich foods, avoiding skipping meals, increasing water intake, work on meal planning and preparation, reading food labels , keeping healthy foods at home, identifying sources and decreasing liquid  calories, decreasing eating out or consumption of processed foods, and making healthy choices when eating convenient foods, planning for success, and better snacking choices celebration eating strategies- one plate with protein as largest portion, clean vegetable and then portion of anything she wants to try but food cannot touch on the plate, be mindful and enjoy your food    Additional resources provided today: Handout on healthy eating and balanced plate, Handout on complex carbohydrates and lean sources of protein, Category 3 packet, and Handout principles  of weight management  Recommended Physical Activity Goals  Alanea has been advised to work up to 150 minutes of moderate intensity aerobic activity a week and strengthening exercises 2-3 times per week for cardiovascular health, weight loss maintenance and preservation of muscle mass.   She has agreed to :  Continue current level of physical activity , Think about enjoyable ways to increase daily physical activity and overcoming barriers to exercise, Increase physical activity in their day and reduce sedentary time (increase NEAT)., and Increase volume of physical activity to a goal of 240 minutes a week  Medical Interventions and Pharmacotherapy We will work on building a therapist, art and behavioral strategies. We will discuss the role of pharmacotherapy as an adjunct at subsequent visits.   ASSOCIATED CONDITIONS ADDRESSED TODAY  Other Fatigue  Chesnie does feel that her weight is causing her energy to be lower than it should be. Fatigue may be related to obesity, depression or many other causes. Labs will be ordered, and in the meanwhile, Sanah will focus on self care including making healthy food choices, increasing physical activity and focusing on stress reduction. - EKG 12 lead  Shortness of Breath on exertion Annison notes increasing shortness of breath with physical activity and seems to be worsening over  time with weight gain. She notes getting out of breath sooner with activity than she used to. This has not gotten worse recently. Tommi denies shortness of breath at rest or orthopnea.  Malignant neoplasm of overlapping sites of left breast in female, estrogen receptor positive (HCC)       Continue Verzenio  100 mg BID for 2 years and zoladex  3.6 mg inject once a month for at least 2 years        Continue regular follow up with oncology, Dr Odean  Type 2 diabetes mellitus without complication, without long-term current use of insulin  (HCC) Start Category 3  meal plan, limit simple carbohydrates Decreasing body weight by 10-15% can improve glucose levels Continue exercise with current goal of 150 minutes of moderate to high intensity exercise/week as tolerated, build endurance Will discuss use of addition of Mounjaro  depending on A1c result- side effect of vomiting on Ozempic  after 3 years of use when she started Chemo -     Insulin , random -     Hemoglobin A1c  Class 3 severe obesity with serious comorbidity and body mass index (BMI) of 50.0 to 59.9 in adult, unspecified obesity type (HCC) See Plan Above -     Insulin , random -     Basic metabolic panel with GFR -     Vitamin B12 -     VITAMIN D  25 Hydroxy (Vit-D Deficiency, Fractures) -     Hemoglobin A1c -     TSH  Depression screening Scored a 10 on screening but does not feel need for further intervention at this time.  Continue to monitor symptoms  Essential hypertension Continue  carvedilol  25 mg BID  Start Category 3 meal plan  and DASH diet Monitor BP and if consistently >140/90 notify PCP If develops headaches, chest pain, shortness of breath or dizziness go to ER Loss of 10-15% body weight can help improve blood pressures  -     Basic metabolic panel with GFR  Chronic tachycardia       Currently well controlled with carvedilol  25 mg BID        Continue to follow regularly with Cardiology , Dr. Krasowski  Vitamin D   deficiency If Vit D  level remains low will supplement with Ergocalciferol  50000 units once a week for 12 weeks and then recheck level.   -     VITAMIN D  25 Hydroxy (Vit-D Deficiency, Fractures)       Follow-up  She was informed of the importance of frequent follow-up visits to maximize her success with intensive lifestyle modifications for her multiple health conditions. She was informed we would discuss her lab results at her next visit unless there is a critical issue that needs to be addressed sooner. Maudell agreed to keep her next visit at the agreed upon time to discuss these results.  Attestation Statement  This is the patient's intake visit at [facility]. The patient's Health Questionnaire was reviewed at length. Included in the packet: current and past health history, medications, allergies, ROS, gynecologic history (women only), surgical history, family history, social history, weight history, weight loss surgery history (for those that have had weight loss surgery), nutritional evaluation, mood and food questionnaire, PHQ9, Epworth questionnaire, sleep habits questionnaire, patient life and health improvement goals questionnaire. These will all be scanned into the patient's chart under media.   During the visit, I independently reviewed the patient's EKG, previous labs, bioimpedance scale results, and indirect calorimetry results. I used this information to medically tailor a meal plan for the patient that will help her to lose weight and will improve her obesity-related conditions. I performed a medically necessary appropriate examination and/or evaluation. I discussed the assessment and treatment plan with the patient. The patient was provided an opportunity to ask questions and all were answered. The patient agreed with the plan and demonstrated an understanding of the instructions. Labs were ordered at this visit and will be reviewed at the next visit unless critical  results need to be addressed immediately. Clinical information was updated and documented in the EMR.   In addition, they received basic education on identification of processed foods and reduction of these, different sources of lean proteins and complex carbohydrates and how to eat balanced by incorporation of whole foods.  Reviewed by clinician on day of visit: allergies, medications, problem list, medical history, surgical history, family history, social history, and previous encounter notes.  I personally spent a total of 55 minutes in the care of the patient today including preparing to see the patient, getting/reviewing separately obtained history, performing a medically appropriate exam/evaluation, counseling and educating, placing orders, referring and communicating with other health care professionals, documenting clinical information in the EHR, and coordinating care. Does not include time for indirect calorimetry, EKG or depression screening  Lonell Liverpool ANP-C

## 2024-02-19 ENCOUNTER — Ambulatory Visit (INDEPENDENT_AMBULATORY_CARE_PROVIDER_SITE_OTHER): Payer: Self-pay | Admitting: Nurse Practitioner

## 2024-02-19 LAB — BASIC METABOLIC PANEL WITH GFR
BUN/Creatinine Ratio: 19 (ref 9–23)
BUN: 16 mg/dL (ref 6–20)
CO2: 21 mmol/L (ref 20–29)
Calcium: 9.2 mg/dL (ref 8.7–10.2)
Chloride: 97 mmol/L (ref 96–106)
Creatinine, Ser: 0.86 mg/dL (ref 0.57–1.00)
Glucose: 257 mg/dL — ABNORMAL HIGH (ref 70–99)
Potassium: 4.7 mmol/L (ref 3.5–5.2)
Sodium: 133 mmol/L — ABNORMAL LOW (ref 134–144)
eGFR: 90 mL/min/1.73 (ref >59)

## 2024-02-19 LAB — HEMOGLOBIN A1C
Est. average glucose Bld gHb Est-mCnc: 183 mg/dL
Hgb A1c MFr Bld: 8 % — ABNORMAL HIGH (ref 4.8–5.6)

## 2024-02-19 LAB — TSH: TSH: 2.01 u[IU]/mL (ref 0.450–4.500)

## 2024-02-19 LAB — VITAMIN D 25 HYDROXY (VIT D DEFICIENCY, FRACTURES): Vit D, 25-Hydroxy: 14.8 ng/mL — ABNORMAL LOW (ref 30.0–100.0)

## 2024-02-19 LAB — INSULIN, RANDOM: INSULIN: 58.8 u[IU]/mL — ABNORMAL HIGH (ref 2.6–24.9)

## 2024-02-19 LAB — VITAMIN B12: Vitamin B-12: 535 pg/mL (ref 232–1245)

## 2024-02-21 ENCOUNTER — Other Ambulatory Visit: Payer: Self-pay

## 2024-02-25 ENCOUNTER — Other Ambulatory Visit (HOSPITAL_COMMUNITY): Payer: Self-pay

## 2024-02-28 ENCOUNTER — Other Ambulatory Visit: Payer: Self-pay

## 2024-03-02 ENCOUNTER — Other Ambulatory Visit: Payer: Self-pay

## 2024-03-02 ENCOUNTER — Inpatient Hospital Stay: Attending: Hematology and Oncology | Admitting: Hematology and Oncology

## 2024-03-02 ENCOUNTER — Other Ambulatory Visit (HOSPITAL_COMMUNITY): Payer: Self-pay

## 2024-03-02 ENCOUNTER — Inpatient Hospital Stay

## 2024-03-02 ENCOUNTER — Inpatient Hospital Stay: Attending: Hematology and Oncology

## 2024-03-02 VITALS — BP 134/73 | HR 89 | Temp 98.0°F | Resp 16 | Ht 66.5 in | Wt 333.3 lb

## 2024-03-02 DIAGNOSIS — L309 Dermatitis, unspecified: Secondary | ICD-10-CM | POA: Insufficient documentation

## 2024-03-02 DIAGNOSIS — E119 Type 2 diabetes mellitus without complications: Secondary | ICD-10-CM | POA: Insufficient documentation

## 2024-03-02 DIAGNOSIS — Z1721 Progesterone receptor positive status: Secondary | ICD-10-CM | POA: Insufficient documentation

## 2024-03-02 DIAGNOSIS — Z923 Personal history of irradiation: Secondary | ICD-10-CM | POA: Insufficient documentation

## 2024-03-02 DIAGNOSIS — Z5111 Encounter for antineoplastic chemotherapy: Secondary | ICD-10-CM | POA: Insufficient documentation

## 2024-03-02 DIAGNOSIS — Z170 Estrogen receptor positive status [ER+]: Secondary | ICD-10-CM | POA: Diagnosis not present

## 2024-03-02 DIAGNOSIS — C50812 Malignant neoplasm of overlapping sites of left female breast: Secondary | ICD-10-CM

## 2024-03-02 DIAGNOSIS — Z79811 Long term (current) use of aromatase inhibitors: Secondary | ICD-10-CM | POA: Insufficient documentation

## 2024-03-02 DIAGNOSIS — Z1732 Human epidermal growth factor receptor 2 negative status: Secondary | ICD-10-CM | POA: Insufficient documentation

## 2024-03-02 LAB — CMP (CANCER CENTER ONLY)
ALT: 27 U/L (ref 0–44)
AST: 29 U/L (ref 15–41)
Albumin: 4.1 g/dL (ref 3.5–5.0)
Alkaline Phosphatase: 100 U/L (ref 38–126)
Anion gap: 9 (ref 5–15)
BUN: 19 mg/dL (ref 6–20)
CO2: 28 mmol/L (ref 22–32)
Calcium: 10 mg/dL (ref 8.9–10.3)
Chloride: 98 mmol/L (ref 98–111)
Creatinine: 0.85 mg/dL (ref 0.44–1.00)
GFR, Estimated: >60 mL/min (ref >60)
Glucose, Bld: 182 mg/dL — ABNORMAL HIGH (ref 70–99)
Potassium: 4.4 mmol/L (ref 3.5–5.1)
Sodium: 134 mmol/L — ABNORMAL LOW (ref 135–145)
Total Bilirubin: 0.3 mg/dL (ref 0.0–1.2)
Total Protein: 7.3 g/dL (ref 6.5–8.1)

## 2024-03-02 LAB — CBC WITH DIFFERENTIAL (CANCER CENTER ONLY)
Abs Immature Granulocytes: 0.01 K/uL (ref 0.00–0.07)
Basophils Absolute: 0.1 K/uL (ref 0.0–0.1)
Basophils Relative: 1 %
Eosinophils Absolute: 0 K/uL (ref 0.0–0.5)
Eosinophils Relative: 1 %
HCT: 30.3 % — ABNORMAL LOW (ref 36.0–46.0)
Hemoglobin: 10.9 g/dL — ABNORMAL LOW (ref 12.0–15.0)
Immature Granulocytes: 0 %
Lymphocytes Relative: 26 %
Lymphs Abs: 1.1 K/uL (ref 0.7–4.0)
MCH: 28.9 pg (ref 26.0–34.0)
MCHC: 36 g/dL (ref 30.0–36.0)
MCV: 80.4 fL (ref 80.0–100.0)
Monocytes Absolute: 0.4 K/uL (ref 0.1–1.0)
Monocytes Relative: 9 %
Neutro Abs: 2.6 K/uL (ref 1.7–7.7)
Neutrophils Relative %: 63 %
Platelet Count: 180 K/uL (ref 150–400)
RBC: 3.77 MIL/uL — ABNORMAL LOW (ref 3.87–5.11)
RDW: 15.2 % (ref 11.5–15.5)
WBC Count: 4.2 K/uL (ref 4.0–10.5)
nRBC: 0 % (ref 0.0–0.2)

## 2024-03-02 MED ORDER — GOSERELIN ACETATE 3.6 MG ~~LOC~~ IMPL
3.6000 mg | DRUG_IMPLANT | Freq: Once | SUBCUTANEOUS | Status: AC
  Administered 2024-03-02: 3.6 mg via SUBCUTANEOUS
  Filled 2024-03-02: qty 3.6

## 2024-03-02 NOTE — Progress Notes (Signed)
 Specialty Pharmacy Ongoing Clinical Assessment Note  Lynn [name] is a 38 y.o. female who is being followed by the specialty pharmacy service for RxSp Oncology   Patient's specialty medication(s) reviewed today: Abemaciclib  (VERZENIO )   Missed doses in the last 4 weeks: 0   Patient/Caregiver did not have any additional questions or concerns.   Therapeutic benefit summary: Patient is achieving benefit   Adverse events/side effects summary: Experienced adverse events/side effects   Patient's therapy is appropriate to: Continue    Goals Addressed             This Visit's Progress    Maintain optimal adherence to therapy   On track    Patient is on track. Patient will maintain adherence          Follow up: 6 months  Bow Buntyn M Ikram Riebe Specialty Pharmacist

## 2024-03-02 NOTE — Assessment & Plan Note (Signed)
 stage IIIB ER/PR positive breast cancer diagnosed in 06/2022 currently receiving neoadjuvant chemotherapy.   06/27/2022: CT CAP: Left breast cancer, 2 left axillary lymph nodes, left adnexal cystic lesion 4 cm    Treatment Plan:  1. Neoadjuvant chemotherapy with Adriamycin  and Cytoxan  dose dense 4 followed by Taxol  weekly 10 completed 10/16/2022 2. 11/20/2022: Left lumpectomy: No residual cancer, margins negative, 0/14 lymph nodes 3. Followed by adjuvant radiation therapy 01/22/2023-03/21/2023 4.  Followed by antiestrogen therapy started 04/03/2023 ____________________________________________________ Antiestrogen therapy: ovarian function suppression with Zoladex  along with anastrozole  with Verzenio  (started 04/10/23)X 2 years, switched to letrozole  06/06/2023   Current dose of Verzenio : 150 p.o. twice daily (started 08/12/2023) Verzinio toxicities: Headaches: Switch to letrozole   Profound nausea and vomiting: Dose had been previously reduced to 100 mg twice daily Fatigue Decreased WBC count and anemia: Monitoring closely   Labs and follow-up in 3 months She will come monthly for injections.

## 2024-03-02 NOTE — Progress Notes (Signed)
 Specialty Pharmacy Refill Coordination Note  Lynn [name] is a 38 y.o. female contacted today regarding refills of specialty medication(s) Abemaciclib  (VERZENIO )   Patient requested Delivery   Delivery date: 03/06/24   Verified address: [address]   [place] [phone]   Medication will be filled on: 03/04/24

## 2024-03-02 NOTE — Progress Notes (Signed)
 Patient Care Team: Nedra Tinnie LABOR, NP as PCP - General (Internal Medicine) Odean Potts, MD as Consulting Physician (Hematology and Oncology) Ebbie Cough, MD as Consulting Physician (General Surgery) Dewey Rush, MD as Consulting Physician (Radiation Oncology)  DIAGNOSIS:  Encounter Diagnosis  Name Primary?   Malignant neoplasm of overlapping sites of left breast in female, estrogen receptor positive (HCC) Yes    SUMMARY OF ONCOLOGIC HISTORY: Oncology History  Malignant neoplasm of overlapping sites of left breast in female, estrogen receptor positive (HCC)  05/29/2022 Initial Diagnosis   Palpable abnormalities in the left breast led to mammograms which revealed 3 masses 0.7 cm at 1 o'clock position, 3 cm at 2 o'clock position, additional nodule 8 mm total span 5 cm, 5 abnormal axillary lymph nodes biopsy of the masses and the lymph node were positive for grade 3 IDC with high-grade DCIS with LVI, ER 95%, PR 40%, Ki-67 60%, HER2 2+ by IHC FISH negative   06/15/2022 Cancer Staging   Staging form: Breast, AJCC 8th Edition - Clinical: Stage IIIB (cT3, cN3, cM0, G3, ER+, PR+, HER2-) - Signed by Crawford Morna Pickle, NP on 06/15/2022 Stage prefix: Initial diagnosis Histologic grading system: 3 grade system   06/15/2022 Breast MRI   IMPRESSION: 1. Multiple sites of biopsy proven malignancy with in the lateral left breast. The overall conglomerate of suspicious findings spans approximately 4.4 x 3.5 x 3.5 cm, and contains all 3 post biopsy marking clips. 2. 10+ morphologically abnormal level I, II, III and Rotter's lymph nodes within the left axilla. 3. Diffuse skin and trabecular thickening on the left, consistent with lymphovascular spread of disease. 4. No MRI evidence of malignancy on the right.   06/20/2022 - 10/09/2022 Chemotherapy   Patient is on Treatment Plan : BREAST ADJUVANT DOSE DENSE AC q14d / PACLitaxel  q7d      Genetic Testing   [facility] Multi-Cancer  Panel+RNA was Negative. Report date is 06/13/2022.  The Multi-Cancer + RNA Panel offered by [facility] includes sequencing and/or deletion/duplication analysis of the following 70 genes:  AIP*, ALK, APC*, ATM*, AXIN2*, BAP1*, BARD1*, BLM*, BMPR1A*, BRCA1*, BRCA2*, BRIP1*, CDC73*, CDH1*, CDK4, CDKN1B*, CDKN2A, CHEK2*, CTNNA1*, DICER1*, EPCAM (del/dup only), EGFR, FH*, FLCN*, GREM1 (promoter dup only), HOXB13, KIT, LZTR1, MAX*, MBD4, MEN1*, MET, MITF, MLH1*, MSH2*, MSH3*, MSH6*, MUTYH*, NF1*, NF2*, NTHL1*, PALB2*, PDGFRA, PMS2*, POLD1*, POLE*, POT1*, PRKAR1A*, PTCH1*, PTEN*, RAD51C*, RAD51D*, RB1*, RET, SDHA* (sequencing only), SDHAF2*, SDHB*, SDHC*, SDHD*, SMAD4*, SMARCA4*, SMARCB1*, SMARCE1*, STK11*, SUFU*, TMEM127*, TP53*, TSC1*, TSC2*, VHL*. RNA analysis is performed for * genes.   11/20/2022 Surgery   A. BREAST, LEFT, LUMPECTOMY:  Fibrosis and slight inflammation consistent with treatment effects  No residual invasive or in situ carcinoma (ypT0)  Margins: All margins negative for carcinoma  Lymphovascular space invasion: Not identified  Biopsy site and biopsy clip  See oncology table   14 LN biopsied negative for cancer.    01/22/2023 - 03/21/2023 Radiation Therapy   Plan Name: Breast_L_BH Site: Breast, Left Technique: 3D Mode: Photon Dose Per Fraction: 1.8 Gy Prescribed Dose (Delivered / Prescribed): 50.4 Gy / 50.4 Gy Prescribed Fxs (Delivered / Prescribed): 28 / 28   Plan Name: Brst_L_SCV_BH Site: Sclav-LT Technique: 3D Mode: Photon Dose Per Fraction: 1.8 Gy Prescribed Dose (Delivered / Prescribed): 50.4 Gy / 50.4 Gy Prescribed Fxs (Delivered / Prescribed): 28 / 28   Plan Name: Brst_L_BH_Bst Site: Breast, Left Technique: 3D Mode: Photon Dose Per Fraction: 2 Gy Prescribed Dose (Delivered / Prescribed): 10 Gy / 10 Gy Prescribed Fxs (  Delivered / Prescribed): 5 / 5   04/2023 -  Anti-estrogen oral therapy   Anastrozole      CHIEF COMPLIANT: Follow-up on Verzinio with  anastrozole   HISTORY OF PRESENT ILLNESS:  History of Present Illness Lynn [name] is a 37 year old female who presents for follow-up regarding her symptoms and medication management.  Headaches have improved. Vomiting resolved after stopping Ozempic , though she is concerned about higher A1c since discontinuation.  She recently started a healthy weight and wellness program and is considering Mounjaro  for diabetes management.  She reports a pruritic, rough, linear skin lesion that temporarily improved with radiation cream but then worsened. She suspects nocturnal scratching.  She is currently taking Verzenio  and has been on this treatment for six months.     ALLERGIES:  is allergic to macrobid [nitrofurantoin macrocrystal] and ibuprofen.  MEDICATIONS:  Current Outpatient Medications  Medication Sig Dispense Refill   abemaciclib  (VERZENIO ) 100 MG tablet Take 1 tablet (100 mg total) by mouth 2 (two) times daily. 60 tablet 6   ALPRAZolam  (XANAX ) 0.5 MG tablet Take 1 tablet (0.5 mg total) by mouth at bedtime as needed for anxiety. 30 tablet 3   aspirin EC 81 MG tablet Take 81 mg by mouth daily. Swallow whole.     aspirin-acetaminophen -caffeine (EXCEDRIN MIGRAINE) 250-250-65 MG tablet Take 1-2 tablets by mouth 2 (two) times daily as needed for headache or migraine.     Blood Glucose Monitoring Suppl (ACCU-CHEK AVIVA PLUS) w/Device KIT Use to check blood sugar once daily at 6 (six) AM. 1 kit 0   carvedilol  (COREG ) 25 MG tablet Take 1 tablet (25 mg total) by mouth 2 (two) times daily with a meal. 180 tablet 0   cholecalciferol (VITAMIN D3) 25 MCG (1000 UNIT) tablet Take 1,000 Units by mouth daily.     esomeprazole (NEXIUM) 20 MG capsule Take 20 mg by mouth daily at 12 noon.     gabapentin  (NEURONTIN ) 100 MG capsule TAKE 1 CAPSULE(100 MG) BY MOUTH THREE TIMES DAILY 90 capsule 3   glucose blood (ACCU-CHEK AVIVA PLUS) test strip Use as instructed 100 each 12   goserelin (ZOLADEX ) 3.6 MG  injection Inject 3.6 mg into the skin every 28 (twenty-eight) days.     Lancets (ONETOUCH DELICA PLUS LANCET33G) MISC Use to check blood sugar once a day at 6 am. 100 each 0   letrozole  (FEMARA ) 2.5 MG tablet Take 1 tablet (2.5 mg total) by mouth daily. 90 tablet 3   lisinopril -hydrochlorothiazide  (ZESTORETIC ) 10-12.5 MG tablet Take 1 tablet by mouth daily. 90 tablet 0   ondansetron  (ZOFRAN ) 8 MG tablet Take 1 tablet (8 mg total) by mouth every 8 (eight) hours as needed for nausea or vomiting. 20 tablet 1   ONE TOUCH CLUB LANCETS MISC 1 each by Does not apply route daily. 100 each 11   prochlorperazine  (COMPAZINE ) 10 MG tablet Take 1 tablet (10 mg total) by mouth every 6 (six) hours as needed for nausea or vomiting. 30 tablet 2   Vitamin D , Ergocalciferol , (DRISDOL ) 1.25 MG (50000 UNIT) CAPS capsule Take 1 capsule (50,000 Units total) by mouth every 7 (seven) days. 12 capsule 0   No current facility-administered medications for this visit.    PHYSICAL EXAMINATION: ECOG PERFORMANCE STATUS: 1 - Symptomatic but completely ambulatory  Vitals:   03/02/24 1523  BP: 134/73  Pulse: 89  Resp: 16  Temp: 98 F (36.7 C)  SpO2: 100%   Filed Weights   03/02/24 1523  Weight: (!) 333 lb  4.8 oz (151.2 kg)    Physical Exam SKIN: Dry, eczema-like lesion with rough texture, no concern for other pathology.  (exam performed in the presence of a chaperone)  LABORATORY DATA:  I have reviewed the data as listed    Latest Ref Rng & Units 02/18/2024    8:52 AM 12/30/2023    1:56 PM 12/09/2023   10:13 AM  CMP  Glucose 70 - 99 mg/dL 742  744  883   BUN 6 - 20 mg/dL 16  13  18    Creatinine 0.57 - 1.00 mg/dL 9.13  9.19  8.94   Sodium 134 - 144 mmol/L 133  134  138   Potassium 3.5 - 5.2 mmol/L 4.7  4.0  4.4   Chloride 96 - 106 mmol/L 97  99  101   CO2 20 - 29 mmol/L 21  30  30    Calcium 8.7 - 10.2 mg/dL 9.2  9.0  9.5   Total Protein 6.5 - 8.1 g/dL  7.2  7.1   Total Bilirubin 0.0 - 1.2 mg/dL  0.5  0.5    Alkaline Phos 38 - 126 U/L  80  62   AST 15 - 41 U/L  20  18   ALT 0 - 44 U/L  27  23     Lab Results  Component Value Date   WBC 5.4 12/30/2023   HGB 10.9 (L) 12/30/2023   HCT 31.1 (L) 12/30/2023   MCV 81.4 12/30/2023   PLT 225 12/30/2023   NEUTROABS 3.6 12/30/2023    ASSESSMENT & PLAN:  Malignant neoplasm of overlapping sites of left breast in female, 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   06/27/2022: CT CAP: Left breast cancer, 2 left axillary lymph nodes, left adnexal cystic lesion 4 cm    Treatment Plan:  1. Neoadjuvant chemotherapy with Adriamycin  and Cytoxan  dose dense 4 followed by Taxol  weekly 10 completed 10/16/2022 2. 11/20/2022: Left lumpectomy: No residual cancer, margins negative, 0/14 lymph nodes 3. Followed by adjuvant radiation therapy 01/22/2023-03/21/2023 4.  Followed by antiestrogen therapy started 04/03/2023 ____________________________________________________ Antiestrogen therapy: ovarian function suppression with Zoladex  along with anastrozole  with Verzenio  (started 04/10/23)X 2 years, switched to letrozole  06/06/2023   Current dose of Verzenio : 150 p.o. twice daily (started 08/12/2023) Verzinio toxicities: Headaches: Resolved when we switched to letrozole   Profound nausea and vomiting: It was related to Ozempic  and not Verzinio Fatigue    Labs and follow-up in 3 months She will come monthly for injections. ------------------------------------- Assessment and Plan Assessment & Plan Estrogen receptor positive malignant neoplasm of overlapping sites of the left breast Abemaciclib  (Verzenio ) 100 MG oral bid. - Scheduled lab checks every three months to monitor white count and platelets.  Type 2 diabetes mellitus A1c increased post-Ozempic  discontinuation. Considering Mounjaro . - Consider starting Mounjaro  for diabetes management. - Continue attending  healthy weight and wellness program.  Localized dermatitis Itchy lesion likely due to dry skin eczema. - Apply cortisone cream to affected area. - Monitor lesion for changes.      Orders Placed This Encounter  Procedures   CBC with Differential (Cancer Center Only)    Standing Status:   Standing    Number of Occurrences:   10    Expiration Date:   03/02/2025   CMP (Cancer Center only)    Standing Status:   Standing    Number of Occurrences:   10    Expiration Date:  03/02/2025   The patient has a good understanding of the overall plan. she agrees with it. she will call with any problems that may develop before the next visit here.  I personally spent a total of 30 minutes in the care of the patient today including preparing to see the patient, getting/reviewing separately obtained history, performing a medically appropriate exam/evaluation, counseling and educating, placing orders, referring and communicating with other health care professionals, documenting clinical information in the EHR, independently interpreting results, communicating results, and coordinating care.   Viinay K Arthurine Oleary, MD 03/02/24

## 2024-03-03 ENCOUNTER — Other Ambulatory Visit: Payer: Self-pay

## 2024-03-03 ENCOUNTER — Ambulatory Visit (INDEPENDENT_AMBULATORY_CARE_PROVIDER_SITE_OTHER): Payer: Self-pay | Admitting: Nurse Practitioner

## 2024-03-11 ENCOUNTER — Ambulatory Visit (INDEPENDENT_AMBULATORY_CARE_PROVIDER_SITE_OTHER): Admitting: Nurse Practitioner

## 2024-03-16 ENCOUNTER — Ambulatory Visit (INDEPENDENT_AMBULATORY_CARE_PROVIDER_SITE_OTHER): Admitting: Nurse Practitioner

## 2024-03-18 ENCOUNTER — Other Ambulatory Visit: Payer: Self-pay

## 2024-03-18 ENCOUNTER — Other Ambulatory Visit (HOSPITAL_COMMUNITY): Payer: Self-pay

## 2024-03-24 ENCOUNTER — Other Ambulatory Visit (HOSPITAL_COMMUNITY): Payer: Self-pay

## 2024-03-31 ENCOUNTER — Telehealth: Payer: Self-pay

## 2024-03-31 NOTE — Telephone Encounter (Signed)
 Oral Oncology Patient Advocate Encounter  Prior Authorization for Verzenio  has been approved.    PA# [id number] Effective dates: 03/31/2024 through 03/31/2025  Renewal   Charlott Hamilton,  CPhT-Adv  she/her/hers [facility]  [facility] Pharm[facility] Services Pharmacy Technician Patient Advocate Specialist III [facility] Phone: [phone]  Fax: [phone] [email or web address]

## 2024-03-31 NOTE — Telephone Encounter (Signed)
 Oral Oncology Patient Advocate Encounter   Received notification that prior authorization for Verzenio  is due for renewal.   PA submitted on 03/31/2024 Key [id number] Status is pending      Charlott Hamilton,  CPhT-Adv  she/her/hers [facility]  [facility] Specialty Pharmacy Services Pharmacy Technician Patient Advocate Specialist III [facility] Phone: [phone]  Fax: [phone] [email or web address]

## 2024-04-01 ENCOUNTER — Other Ambulatory Visit (HOSPITAL_COMMUNITY): Payer: Self-pay

## 2024-04-03 ENCOUNTER — Other Ambulatory Visit (HOSPITAL_COMMUNITY): Payer: Self-pay

## 2024-04-03 ENCOUNTER — Telehealth: Payer: Self-pay

## 2024-04-03 ENCOUNTER — Inpatient Hospital Stay

## 2024-04-03 NOTE — Telephone Encounter (Signed)
 Called Mrs. Ingrum to let her know that she missed her appointment today and she is out of town in [place]  and had forgotten about it. Rescheduled her for 04/07/2024 at 3pm. Andrea CHRISTELLA Plunk, RN

## 2024-04-07 ENCOUNTER — Other Ambulatory Visit: Payer: Self-pay

## 2024-04-07 ENCOUNTER — Inpatient Hospital Stay: Attending: Hematology and Oncology

## 2024-04-07 VITALS — BP 143/74 | HR 98 | Temp 98.0°F | Resp 18

## 2024-04-07 DIAGNOSIS — Z170 Estrogen receptor positive status [ER+]: Secondary | ICD-10-CM

## 2024-04-07 MED ORDER — GOSERELIN ACETATE 3.6 MG ~~LOC~~ IMPL
3.6000 mg | DRUG_IMPLANT | Freq: Once | SUBCUTANEOUS | Status: AC
  Administered 2024-04-07: 3.6 mg via SUBCUTANEOUS
  Filled 2024-04-07: qty 3.6

## 2024-04-07 NOTE — Progress Notes (Signed)
 Specialty Pharmacy Refill Coordination Note  Lynn [name] is a 39 y.o. female contacted today regarding refills of specialty medication(s) Abemaciclib  (VERZENIO )   Patient requested Delivery   Delivery date: 04/08/24   Verified address: [address]   [place] [address]   Medication will be filled on: 04/07/24

## 2024-04-09 ENCOUNTER — Ambulatory Visit: Payer: Self-pay | Admitting: Nurse Practitioner

## 2024-04-09 ENCOUNTER — Other Ambulatory Visit: Payer: Self-pay

## 2024-04-09 ENCOUNTER — Ambulatory Visit: Admitting: Nurse Practitioner

## 2024-04-09 ENCOUNTER — Other Ambulatory Visit (HOSPITAL_COMMUNITY): Payer: Self-pay

## 2024-04-09 ENCOUNTER — Encounter: Payer: Self-pay | Admitting: Nurse Practitioner

## 2024-04-09 VITALS — BP 130/78 | HR 94 | Temp 97.6°F | Ht 66.5 in | Wt 332.0 lb

## 2024-04-09 DIAGNOSIS — L853 Xerosis cutis: Secondary | ICD-10-CM | POA: Diagnosis not present

## 2024-04-09 DIAGNOSIS — E559 Vitamin D deficiency, unspecified: Secondary | ICD-10-CM

## 2024-04-09 DIAGNOSIS — I10 Essential (primary) hypertension: Secondary | ICD-10-CM | POA: Diagnosis not present

## 2024-04-09 DIAGNOSIS — Z23 Encounter for immunization: Secondary | ICD-10-CM

## 2024-04-09 DIAGNOSIS — E119 Type 2 diabetes mellitus without complications: Secondary | ICD-10-CM | POA: Diagnosis not present

## 2024-04-09 DIAGNOSIS — Z7985 Long-term (current) use of injectable non-insulin antidiabetic drugs: Secondary | ICD-10-CM | POA: Diagnosis not present

## 2024-04-09 DIAGNOSIS — R000 Tachycardia, unspecified: Secondary | ICD-10-CM | POA: Diagnosis not present

## 2024-04-09 LAB — LIPID PANEL
Cholesterol: 173 mg/dL (ref 28–200)
HDL: 57.9 mg/dL
LDL Cholesterol: 76 mg/dL (ref 10–99)
NonHDL: 115.22
Total CHOL/HDL Ratio: 3
Triglycerides: 195 mg/dL — ABNORMAL HIGH (ref 10.0–149.0)
VLDL: 39 mg/dL (ref 0.0–40.0)

## 2024-04-09 LAB — BASIC METABOLIC PANEL WITH GFR
BUN: 15 mg/dL (ref 6–23)
CO2: 28 meq/L (ref 19–32)
Calcium: 9.3 mg/dL (ref 8.4–10.5)
Chloride: 96 meq/L (ref 96–112)
Creatinine, Ser: 0.84 mg/dL (ref 0.40–1.20)
GFR: 89.22 mL/min
Glucose, Bld: 242 mg/dL — ABNORMAL HIGH (ref 70–99)
Potassium: 4.4 meq/L (ref 3.5–5.1)
Sodium: 132 meq/L — ABNORMAL LOW (ref 135–145)

## 2024-04-09 LAB — VITAMIN D 25 HYDROXY (VIT D DEFICIENCY, FRACTURES): VITD: 14.09 ng/mL — ABNORMAL LOW (ref 30.00–100.00)

## 2024-04-09 MED ORDER — TIRZEPATIDE 2.5 MG/0.5ML ~~LOC~~ SOAJ
2.5000 mg | SUBCUTANEOUS | 1 refills | Status: AC
  Filled 2024-04-09: qty 2, 28d supply, fill #0
  Filled 2024-05-07: qty 2, 28d supply, fill #1

## 2024-04-09 MED ORDER — TRIAMCINOLONE ACETONIDE 0.1 % EX CREA
1.0000 | TOPICAL_CREAM | Freq: Two times a day (BID) | CUTANEOUS | 0 refills | Status: AC
  Filled 2024-04-09: qty 30, 15d supply, fill #0

## 2024-04-09 MED ORDER — CARVEDILOL 25 MG PO TABS
25.0000 mg | ORAL_TABLET | Freq: Two times a day (BID) | ORAL | 1 refills | Status: AC
  Filled 2024-04-09 – 2024-05-07 (×2): qty 180, 90d supply, fill #0

## 2024-04-09 MED ORDER — TACROLIMUS 0.1 % EX OINT
TOPICAL_OINTMENT | Freq: Two times a day (BID) | CUTANEOUS | 0 refills | Status: AC
  Filled 2024-04-09: qty 100, 30d supply, fill #0

## 2024-04-09 MED ORDER — VITAMIN D (ERGOCALCIFEROL) 1.25 MG (50000 UNIT) PO CAPS
50000.0000 [IU] | ORAL_CAPSULE | ORAL | 0 refills | Status: AC
  Filled 2024-04-09: qty 12, 84d supply, fill #0

## 2024-04-09 NOTE — Assessment & Plan Note (Signed)
 Well-controlled with current medication. Continue lisinopril -hctz 10-12.5mg  daily and coreg  25mg  BID. Recent CMP, CBC reviewed.

## 2024-04-09 NOTE — Assessment & Plan Note (Signed)
 Chronic, not controlled. Fasting blood sugars and A1c are elevated. She previously had an adverse reaction to Ozempic , so Mounjaro  is being considered as an alternative. Started Mounjaro  2.5 mg once weekly. Discussed possible side effects. Check BMP, lipid panel today. Follow up in 6-8 weeks to assess response to Mounjaro .

## 2024-04-09 NOTE — Patient Instructions (Addendum)
 It was great to see you!  We are checking your labs today and will let you know the results via mychart/phone.   Let's start mounjaro  2.5mg  weekly   Let me know if you have any side effects   Start triamcinolone  cream twice a day   I placed a referral to dermatology  Start tacrolimus  ointment around your eye twice a day.   Let's follow-up in 6-8 weeks, sooner if you have concerns.  If a referral was placed today, you will be contacted for an appointment. Please note that routine referrals can sometimes take up to 3-4 weeks to process. Please call our office if you haven't heard anything after this time frame.  Take care,  Tinnie Harada, NP

## 2024-04-09 NOTE — Assessment & Plan Note (Signed)
 Chronic, stable.  Continue carvedilol  25 mg twice daily.  Continue collaboration and recommendations from cardiology.

## 2024-04-09 NOTE — Assessment & Plan Note (Signed)
 Start mounjaro  2.5mg  weekly

## 2024-04-09 NOTE — Progress Notes (Signed)
 "  Established Patient Office Visit  Subjective   Patient ID: Lynn [name], female    DOB: 22-Aug-1985  Age: 39 y.o. MRN: [id number]  Chief Complaint  Patient presents with   Diabetes    Follow up, patient is fasting for labs, Flu Vaccine, discuss starting mounjaro , Rx refill    HPI  Discussed the use of AI scribe software for clinical note transcription with the patient, who gave verbal consent to proceed.  History of Present Illness   Lynn [name] is a 39 year old female with diabetes who presents with persistent vomiting and high blood sugar levels.  She previously had severe projectile vomiting without nausea temporally related to Ozempic , requiring frequent IV fluids, which stopped within three weeks of discontinuing the medication. She is following with oncology and they thought it was her chemo regimen, however with adjustments she was still vomiting and requiring fluids. She has been better after stopping ozempic  for 2 months.   Her fasting blood sugars have been 180 to 190 mg/dL and her recent J8r is 8.0. She checks blood sugar at home but not consistently and is worried about poor control. She denies chest pain and shortness of breath.   She has a chronic dry, itchy rash on her breast with limited relief from cortisone cream. She also has seborrheic dermatitis with flaking on her scalp and around her eyes. Her sister has psoriasis, but she has not been diagnosed with it. She would like a referral to dermatology.       ROS See pertinent positives and negatives per HPI.    Objective:     BP 130/78 (BP Location: Right Arm, Patient Position: Sitting, Cuff Size: Large)   Pulse 94   Temp 97.6 F (36.4 C)   Ht 5' 6.5 (1.689 m)   Wt (!) 332 lb (150.6 kg)   LMP 04/03/2023 (Approximate)   SpO2 95%   BMI 52.78 kg/m    Physical Exam Vitals and nursing note reviewed.  Constitutional:      General: She is not in acute distress.    Appearance: Normal appearance.   HENT:     Head: Normocephalic.  Eyes:     Conjunctiva/sclera: Conjunctivae normal.  Cardiovascular:     Rate and Rhythm: Normal rate and regular rhythm.     Pulses: Normal pulses.     Heart sounds: Normal heart sounds.  Pulmonary:     Effort: Pulmonary effort is normal.     Breath sounds: Normal breath sounds.  Musculoskeletal:     Cervical back: Normal range of motion.  Skin:    General: Skin is warm.     Comments: Dry flaking skin around right eye  Neurological:     General: No focal deficit present.     Mental Status: She is alert and oriented to person, place, and time.  Psychiatric:        Mood and Affect: Mood normal.        Behavior: Behavior normal.        Thought Content: Thought content normal.        Judgment: Judgment normal.      Assessment & Plan:   Problem List Items Addressed This Visit       Cardiovascular and Mediastinum   Essential hypertension   Well-controlled with current medication. Continue lisinopril -hctz 10-12.5mg  daily and coreg  25mg  BID. Recent CMP, CBC reviewed.       Relevant Medications   carvedilol  (COREG ) 25 MG tablet     Endocrine  Type 2 diabetes mellitus (HCC) - Primary   Chronic, not controlled. Fasting blood sugars and A1c are elevated. She previously had an adverse reaction to Ozempic , so Mounjaro  is being considered as an alternative. Started Mounjaro  2.5 mg once weekly. Discussed possible side effects. Check BMP, lipid panel today. Follow up in 6-8 weeks to assess response to Mounjaro .      Relevant Medications   tirzepatide  (MOUNJARO ) 2.5 MG/0.5ML Pen   Other Relevant Orders   Basic metabolic panel with GFR   Lipid panel     Musculoskeletal and Integument   Dry skin dermatitis   She has chronic dry skin dermatitis and seborrheic dermatitis with itching and flaking. Previous use of cortisone cream provided limited relief. Start triamcinolone  cream for the chest, to be applied twice daily for one week, then take a week  off, and repeat if needed. Tacrolimus  ointment was prescribed for use around the eyes, to be applied twice daily. She was referred to dermatology for further evaluation and management.       Relevant Orders   Ambulatory referral to Dermatology     Other   Chronic tachycardia   Chronic, stable.  Continue carvedilol  25 mg twice daily.  Continue collaboration and recommendations from cardiology.       Relevant Medications   carvedilol  (COREG ) 25 MG tablet   Vitamin D  deficiency   Managed with daily vitamin D  gummies. Vitamin D  levels will be checked with fasting labs.      Relevant Orders   VITAMIN D  25 Hydroxy (Vit-D Deficiency, Fractures)   Long-term current use of injectable noninsulin antidiabetic medication   Start mounjaro  2.5 mg weekly       Other Visit Diagnoses       Immunization due       Flu vaccine given today   Relevant Orders   Flu vaccine trivalent PF, 6mos and older(Flulaval,Afluria,Fluarix,Fluzone) (Completed)      Return in about 6 weeks (around 05/21/2024) for 6-8 weeks , Diabetes.    Tinnie DELENA Harada, NP  "

## 2024-04-09 NOTE — Assessment & Plan Note (Signed)
 She has chronic dry skin dermatitis and seborrheic dermatitis with itching and flaking. Previous use of cortisone cream provided limited relief. Start triamcinolone  cream for the chest, to be applied twice daily for one week, then take a week off, and repeat if needed. Tacrolimus  ointment was prescribed for use around the eyes, to be applied twice daily. She was referred to dermatology for further evaluation and management.

## 2024-04-09 NOTE — Assessment & Plan Note (Signed)
 Managed with daily vitamin D  gummies. Vitamin D  levels will be checked with fasting labs.

## 2024-04-10 ENCOUNTER — Telehealth (HOSPITAL_COMMUNITY): Payer: Self-pay

## 2024-04-10 ENCOUNTER — Other Ambulatory Visit (HOSPITAL_COMMUNITY): Payer: Self-pay

## 2024-04-10 NOTE — Telephone Encounter (Signed)
 Pharmacy Patient Advocate Encounter   Received notification from Pt Calls Messages that prior authorization for Tacrolimus  0.1% ointment  is required/requested.   Insurance verification completed.   The patient is insured through [facility].   Per test claim: PA required; PA submitted to above mentioned insurance via Latent Key/confirmation #/EOC [id number] Status is pending

## 2024-04-10 NOTE — Telephone Encounter (Signed)
 Pharmacy Patient Advocate Encounter   Received notification from Pt Calls Messages that prior authorization for Mounjaro  2.5MG /0.5ML auto-injectors  is required/requested.   Insurance verification completed.   The patient is insured through [facility].   Per test claim: PA required; PA submitted to above mentioned insurance via Latent Key/confirmation #/EOC [id number] Status is pending

## 2024-04-11 ENCOUNTER — Encounter (HOSPITAL_COMMUNITY): Payer: Self-pay

## 2024-04-13 ENCOUNTER — Other Ambulatory Visit (HOSPITAL_COMMUNITY): Payer: Self-pay

## 2024-04-14 ENCOUNTER — Other Ambulatory Visit (HOSPITAL_COMMUNITY): Payer: Self-pay

## 2024-04-14 ENCOUNTER — Other Ambulatory Visit: Payer: Self-pay

## 2024-04-14 ENCOUNTER — Encounter: Payer: Self-pay | Admitting: *Deleted

## 2024-04-14 NOTE — Telephone Encounter (Signed)
 Pharmacy Patient Advocate Encounter  Received notification from [facility] that Prior Authorization for  Mounjaro  2.5MG /0.5ML auto-injectors  has been APPROVED from 04/09/24 to 04/09/25. Ran test claim, Copay is $0. This test claim was processed through [facility]- copay amounts may vary at other pharmacies due to pharmacy/plan contracts, or as the patient moves through the different stages of their insurance plan.   PA #/Case ID/Reference #: [id number]

## 2024-04-14 NOTE — Telephone Encounter (Signed)
 Pharmacy Patient Advocate Encounter  Received notification from [facility] that Prior Authorization for  Tacrolimus  0.1% ointment  has been APPROVED from 04/09/24 to 04/10/27. Ran test claim, Copay is $0. This test claim was processed through [facility]- copay amounts may vary at other pharmacies due to pharmacy/plan contracts, or as the patient moves through the different stages of their insurance plan.   PA #/Case ID/Reference #: [id number]

## 2024-04-16 ENCOUNTER — Encounter: Payer: Self-pay | Admitting: Hematology and Oncology

## 2024-04-19 ENCOUNTER — Other Ambulatory Visit: Payer: Self-pay

## 2024-04-19 ENCOUNTER — Emergency Department (HOSPITAL_COMMUNITY): Admission: EM | Admit: 2024-04-19 | Discharge: 2024-04-19 | Disposition: A | Discharge status: Home / Self Care

## 2024-04-19 ENCOUNTER — Encounter (HOSPITAL_COMMUNITY): Payer: Self-pay

## 2024-04-19 DIAGNOSIS — Z79899 Other long term (current) drug therapy: Secondary | ICD-10-CM | POA: Insufficient documentation

## 2024-04-19 DIAGNOSIS — E119 Type 2 diabetes mellitus without complications: Secondary | ICD-10-CM | POA: Insufficient documentation

## 2024-04-19 DIAGNOSIS — K0381 Cracked tooth: Secondary | ICD-10-CM | POA: Insufficient documentation

## 2024-04-19 DIAGNOSIS — Z7982 Long term (current) use of aspirin: Secondary | ICD-10-CM | POA: Diagnosis not present

## 2024-04-19 DIAGNOSIS — K0889 Other specified disorders of teeth and supporting structures: Secondary | ICD-10-CM | POA: Diagnosis present

## 2024-04-19 DIAGNOSIS — I10 Essential (primary) hypertension: Secondary | ICD-10-CM | POA: Diagnosis not present

## 2024-04-19 MED ORDER — OXYCODONE HCL 5 MG PO TABS: 5.0000 mg | ORAL_TABLET | ORAL | 0 refills | Status: AC | PRN

## 2024-04-19 MED ORDER — OXYCODONE HCL 5 MG PO TABS
5.0000 mg | ORAL_TABLET | Freq: Once | ORAL | Status: AC
  Administered 2024-04-19: 5 mg via ORAL
  Filled 2024-04-19: qty 1

## 2024-04-19 NOTE — ED Triage Notes (Signed)
 Pt arrived POV from home c/o a cracked tooth on the bottom left towards the back. Pt states the nerve is exposed and causing pain. Pt states today is the first day she could not take it.

## 2024-04-19 NOTE — ED Provider Notes (Signed)
 " [facility] Provider Note   CSN: [id number] Arrival date & time: 04/19/24  1630     Patient presents with: Dental Pain   Lynn [name] is a 39 y.o. female who presents to the  emergency department with a chief complaint of dental pain.  Patient states that one of the teeth on the bottom part of her right jaw broke quite a while ago.  States that she did not have any pain previously however she is concerned that her nerve may now be exposed, as she has had significant pain the last 2 to 3 days.  Denies infectious symptoms including fever, chills.  Denies issues swallowing or talking.  Denies neck pain or significant facial swelling.  Patient does have plan to see dentist this upcoming week.  Patient has been attempting to take Tylenol  without significant relief.    Dental Pain      Prior to Admission medications  Medication Sig Start Date End Date Taking? Authorizing Provider  oxyCODONE  (ROXICODONE ) 5 MG immediate release tablet Take 1 tablet (5 mg total) by mouth every 4 (four) hours as needed for severe pain (pain score 7-10). 04/19/24  Yes London Nonaka F, PA-C  abemaciclib  (VERZENIO ) 100 MG tablet Take 1 tablet (100 mg total) by mouth 2 (two) times daily. 10/28/23   Gudena, Vinay, MD  ALPRAZolam  (XANAX ) 0.5 MG tablet Take 1 tablet (0.5 mg total) by mouth at bedtime as needed for anxiety. 01/23/24   Gudena, Vinay, MD  aspirin EC 81 MG tablet Take 81 mg by mouth daily. Swallow whole.    [provider]  aspirin-acetaminophen -caffeine (EXCEDRIN MIGRAINE) 250-250-65 MG tablet Take 1-2 tablets by mouth 2 (two) times daily as needed for headache or migraine.    [provider]  Blood Glucose Monitoring Suppl (ACCU-CHEK AVIVA PLUS) w/Device KIT Use to check blood sugar once daily at 6 (six) AM. 07/11/23   McElwee, Tinnie LABOR, NP  carvedilol  (COREG ) 25 MG tablet Take 1 tablet (25 mg total) by mouth 2 (two) times daily with a meal.  04/09/24   McElwee, Lauren A, NP  cholecalciferol (VITAMIN D3) 25 MCG (1000 UNIT) tablet Take 1,000 Units by mouth daily.    [provider]  esomeprazole (NEXIUM) 20 MG capsule Take 20 mg by mouth daily at 12 noon.    [provider]  gabapentin  (NEURONTIN ) 100 MG capsule TAKE 1 CAPSULE(100 MG) BY MOUTH THREE TIMES DAILY 10/24/23   Gudena, Vinay, MD  glucose blood (ACCU-CHEK AVIVA PLUS) test strip Use as instructed 05/07/22   McElwee, Lauren A, NP  goserelin (ZOLADEX ) 3.6 MG injection Inject 3.6 mg into the skin every 28 (twenty-eight) days.    [provider]  Lancets [facility] DELICA PLUS [place]) MISC Use to check blood sugar once a day at 6 am. 07/11/23   McElwee, Tinnie LABOR, NP  letrozole  (FEMARA ) 2.5 MG tablet Take 1 tablet (2.5 mg total) by mouth daily. 06/06/23   Gudena, Vinay, MD  lisinopril -hydrochlorothiazide  (ZESTORETIC ) 10-12.5 MG tablet Take 1 tablet by mouth daily. 02/03/24   McElwee, Lauren A, NP  ondansetron  (ZOFRAN ) 8 MG tablet Take 1 tablet (8 mg total) by mouth every 8 (eight) hours as needed for nausea or vomiting. 12/09/23   Walisiewicz, Kaitlyn E, PA-C  ONE TOUCH CLUB LANCETS MISC 1 each by Does not apply route daily. 12/22/21   McElwee, Lauren A, NP  prochlorperazine  (COMPAZINE ) 10 MG tablet Take 1 tablet (10 mg total) by mouth every  6 (six) hours as needed for nausea or vomiting. 12/09/23   Walisiewicz, Kaitlyn E, PA-C  tacrolimus  (PROTOPIC ) 0.1 % ointment Apply topically 2 (two) times daily. 04/09/24   McElwee, Lauren A, NP  tirzepatide  (MOUNJARO ) 2.5 MG/0.5ML Pen Inject 2.5 mg into the skin once a week. 04/09/24   McElwee, Lauren A, NP  triamcinolone  cream (KENALOG ) 0.1 % Apply 1 Application topically 2 (two) times daily. 04/09/24   McElwee, Lauren A, NP  Vitamin D , Ergocalciferol , (DRISDOL ) 1.25 MG (50000 UNIT) CAPS capsule Take 1 capsule (50,000 Units total) by mouth every 7 (seven) days. 04/09/24   McElwee, Tinnie LABOR, NP    Allergies: Macrobid [nitrofurantoin  macrocrystal] and Ibuprofen    Review of Systems  HENT:  Positive for dental problem.     Updated Vital Signs BP (!) 153/75 (BP Location: Right Arm)   Pulse (!) 108   Temp 98.9 F (37.2 C)   Resp 18   Ht 5' 7 (1.702 m)   Wt (!) 147.4 kg   LMP 04/03/2023 (Approximate)   SpO2 95%   BMI 50.90 kg/m   Physical Exam Vitals and nursing note reviewed.  Constitutional:      General: She is awake. She is not in acute distress.    Appearance: Normal appearance. She is obese. She is not ill-appearing, toxic-appearing or diaphoretic.  HENT:     Head: Normocephalic and atraumatic.     Mouth/Throat:     Mouth: Mucous membranes are moist.     Pharynx: No oropharyngeal exudate or posterior oropharyngeal erythema.      Comments: Uvula midline, patient talking in full sentences on room air, tooth on bottom right jaw does appear broken, no sign of active bleeding, no sign of infection including redness, no active drainage  No tripoding or drooling, no appreciable fluctuance of inferior mandible Eyes:     General: No scleral icterus.    Conjunctiva/sclera: Conjunctivae normal.  Neck:     Comments: No cervical adenopathy appreciated, no swelling of face or neck Pulmonary:     Effort: Pulmonary effort is normal. No respiratory distress.  Musculoskeletal:        General: Normal range of motion.     Cervical back: Normal range of motion.     Right lower leg: No edema.     Left lower leg: No edema.  Skin:    General: Skin is warm.     Capillary Refill: Capillary refill takes less than 2 seconds.  Neurological:     General: No focal deficit present.     Mental Status: She is alert and oriented to person, place, and time.  Psychiatric:        Mood and Affect: Mood normal.        Behavior: Behavior normal. Behavior is cooperative.      (all labs ordered are listed, but only abnormal results are displayed) Labs Reviewed - No data to display  EKG: None  Radiology: No results  found.   Procedures   Medications Ordered in the ED  oxyCODONE  (Oxy IR/ROXICODONE ) immediate release tablet 5 mg (5 mg Oral Given 04/19/24 1659)                                    Medical Decision Making Risk Prescription drug management.   Patient presents to the ED for concern of dental pain, this involves an extensive number of treatment options, and is a  complaint that carries with it a high risk of complications and morbidity.  The differential diagnosis includes dental pain, fractured tooth, dental abscess, peritonsillar abscess, Ludwig's angina, etc.   Co morbidities that complicate the patient evaluation  Obesity, hypertension, generalized anxiety disorder, type 2 diabetes, PCOS, etc.   Medicines ordered and prescription drug management:  I ordered medication including oxycodone  for pain Reevaluation of the patient after these medicines showed that the patient improved I have reviewed the patients home medicines and have made adjustments as needed   Test Considered:  Lab work: Deferred at this time as patient vital signs are stable, no systemic symptoms suggestive of larger infection Imaging: No appreciable abscess, no signs of infection with visualization of tooth, no concern for airway disruption, no significant swelling of face or neck   Critical Interventions:  None   Problem List / ED Course:  39 year old female, vital signs stable, presents to the emergency department for chief complaint of dental pain, has a known fractured tooth and states that over the last 2 to 3 days she has been experiencing worsening pain, trying to follow-up with a dentist soon Denies throat pain, issues swallowing, or issues breathing, no sign of significant facial or neck swelling, appearance not consistent with Ludwig's angina, denies systemic infectious symptoms, denies known drainage On physical exam tooth on right lower jaw appears broken, no sign of infection, no appreciable  abscess, erythema, or active drainage, no active bleeding, no fluctuance of inferior mandible, patient talking in full sentences on room air Discussed with patient options for pain control, patient states that she has been taking over-the-counter Tylenol  without relief, will supply a short course of narcotic pain medications and also instructed on alternating Tylenol  for pain. Patient states she has an allergy to NSAIDs. Instructed the importance of following up with dentist as soon as possible Return precautions given Patient discharged Most likely diagnosis at this time is uncomplicated dental pain, patient is nontoxic-appearing with stable vitals, patient does have a mild tachycardia, suspect that this is due to pain, I can also see where patient has chronic tachycardia documented in chart, considered prescribing antibiotics however no sign of infection here today so deferred at this time   Reevaluation:  After the interventions noted above, I reevaluated the patient and found that they have :improved   Social Determinants of Health:  none   Dispostion:  After consideration of the diagnostic results and the patients response to treatment, I feel that the patent would benefit from discharge and outpatient therapy as described, follow-up with a dentist to soon as possible.       Final diagnoses:  Pain, dental    ED Discharge Orders          Ordered    oxyCODONE  (ROXICODONE ) 5 MG immediate release tablet  Every 4 hours PRN        04/19/24 1700               Sayda Grable F, PA-C 04/19/24 2051  "

## 2024-04-19 NOTE — Discharge Instructions (Signed)
 It was a pleasure taking care of you today.  For help with your dental pain I have sent in a prescription of oxycodone , please continue taking Tylenol  every 6 hours as needed for pain.  Please do not take more than 1000 mg in a single dose or more than 4000 mg in a day.  Since you are allergic to anti-inflammatory medicines, please stay away from these.  Please do not drive or operate heavy machinery after taking oxycodone  as it may make you drowsy.  Please follow-up with a dentist as soon as possible.  Today your tooth does not look infected, it only looks chipped.  If you begin to experience fever, chills, trouble swallowing, throat pain, facial swelling, severe pain, or other concerning symptom please return to the emergency department or seek further medical care.  Please follow-up with a dentist as soon as possible, preferably within the next 24 to 48 hours.  Please make your primary care provider aware of your workup and all findings today.

## 2024-04-21 ENCOUNTER — Other Ambulatory Visit (HOSPITAL_COMMUNITY): Payer: Self-pay

## 2024-04-21 ENCOUNTER — Encounter: Payer: Self-pay | Admitting: Hematology and Oncology

## 2024-04-21 NOTE — Telephone Encounter (Signed)
 I called pharmacy and notified the pharmacist that Rx was approved and he said that it was shipped out to patient today.   I called patient and left a message that Rx was approved and that the pharmacy has shipped that Rx out to her.

## 2024-04-22 ENCOUNTER — Other Ambulatory Visit: Payer: Self-pay | Admitting: Hematology and Oncology

## 2024-04-28 ENCOUNTER — Other Ambulatory Visit: Payer: Self-pay

## 2024-04-30 ENCOUNTER — Other Ambulatory Visit: Payer: Self-pay | Admitting: Pharmacy Technician

## 2024-04-30 ENCOUNTER — Other Ambulatory Visit: Payer: Self-pay

## 2024-04-30 NOTE — Progress Notes (Signed)
 Specialty Pharmacy Refill Coordination Note  Lynn [name] is a 39 y.o. female contacted today regarding refills of specialty medication(s) Abemaciclib  (VERZENIO )   Patient requested Delivery   Delivery date: 05/01/24   Verified address: [address]  [place]   Medication will be filled on: 04/30/24

## 2024-05-01 ENCOUNTER — Inpatient Hospital Stay

## 2024-05-01 VITALS — BP 127/61 | HR 103 | Resp 17

## 2024-05-01 DIAGNOSIS — C50812 Malignant neoplasm of overlapping sites of left female breast: Secondary | ICD-10-CM

## 2024-05-01 MED ORDER — GOSERELIN ACETATE 3.6 MG ~~LOC~~ IMPL
3.6000 mg | DRUG_IMPLANT | Freq: Once | SUBCUTANEOUS | Status: AC
  Administered 2024-05-01: 3.6 mg via SUBCUTANEOUS
  Filled 2024-05-01: qty 3.6

## 2024-05-07 ENCOUNTER — Other Ambulatory Visit: Payer: Self-pay | Admitting: Nurse Practitioner

## 2024-05-07 ENCOUNTER — Other Ambulatory Visit: Payer: Self-pay

## 2024-05-07 ENCOUNTER — Other Ambulatory Visit (HOSPITAL_COMMUNITY): Payer: Self-pay

## 2024-05-07 ENCOUNTER — Telehealth: Payer: Self-pay | Admitting: Hematology and Oncology

## 2024-05-07 DIAGNOSIS — I10 Essential (primary) hypertension: Secondary | ICD-10-CM

## 2024-05-07 DIAGNOSIS — R000 Tachycardia, unspecified: Secondary | ICD-10-CM

## 2024-05-07 MED ORDER — LISINOPRIL-HYDROCHLOROTHIAZIDE 10-12.5 MG PO TABS
1.0000 | ORAL_TABLET | Freq: Every day | ORAL | 0 refills | Status: AC
  Filled 2024-05-07: qty 90, 90d supply, fill #0

## 2024-05-07 NOTE — Telephone Encounter (Signed)
 I spoke with patient and she is aware of rescheduled lab and MD appointments from 06/01/2024 to 06/15/2024.

## 2024-05-07 NOTE — Telephone Encounter (Signed)
 Requesting: lisinopril -hydrochlorothiazide  (ZESTORETIC ) 10-12.5 MG tablet  Last Visit: 04/09/2024 Next Visit: 05/21/2024 Last Refill: 02/03/2024  Please Advise

## 2024-05-08 ENCOUNTER — Ambulatory Visit (HOSPITAL_BASED_OUTPATIENT_CLINIC_OR_DEPARTMENT_OTHER): Admitting: Student

## 2024-05-08 ENCOUNTER — Ambulatory Visit (HOSPITAL_BASED_OUTPATIENT_CLINIC_OR_DEPARTMENT_OTHER)

## 2024-05-08 DIAGNOSIS — M25562 Pain in left knee: Secondary | ICD-10-CM

## 2024-05-08 DIAGNOSIS — S90112A Contusion of left great toe without damage to nail, initial encounter: Secondary | ICD-10-CM

## 2024-05-08 DIAGNOSIS — M79675 Pain in left toe(s): Secondary | ICD-10-CM

## 2024-05-08 NOTE — Progress Notes (Unsigned)
 "                                Chief Complaint: Left knee and left big toe pain    Discussed the use of AI scribe software for clinical note transcription with the patient, who gave verbal consent to proceed.  History of Present Illness Pearley Millington is a 39 year old female with breast cancer on maintenance therapy and chemotherapy-induced neuropathy who presents for evaluation of acute left knee and left great toe pain following recent trauma.  On Thursday she tripped over a bed sheet while wearing tennis shoes and fell onto an open suitcase, landing on and twisting the left knee and striking the left foot against the suitcase wheels and hard sides. Left knee swelling developed by the end of the day, with pain worse the next day, especially with stair ascent and descent. She has pain with knee flexion at a distinct point in the arc of motion, with occasional new clicking. Pain radiates into the thigh and is worse with weight bearing and stair descent, with less pain on level walking. She notes no new numbness beyond baseline chemotherapy-induced neuropathy. She had a prior left knee bursa rupture at age 16 treated with cortisone injections and physical therapy, with full resolution.  She has used acetaminophen  and ice with minimal relief. She cannot take oral NSAIDs due to hives with ibuprofen and palpitations with naproxen after chemotherapy. She has topical Voltaren available. She denies new sensory changes beyond her baseline neuropathy.  The day before the fall, a heavy knife handle struck the dorsal left great toe, causing immediate bruising and a ruptured blood blister. She can bend the toe without severe pain. After the fall, bruising extended into the foot. She denies nail injury and can ambulate, with knee pain more limiting than toe pain. She has not used taping or a rigid shoe. No other injuries or bruising are reported.   Surgical History:   None  PMH/PSH/Family History/Social  History/Meds/Allergies:    Past Medical History:  Diagnosis Date   Anemia    Anxiety    Back pain    Bilateral swelling of feet    LEGS   Breast cancer (HCC) 02/28   Cancer (HCC) 2024   L breast   Carpal tunnel syndrome, left    Chest pain    Constipation    Depression    Diabetes (HCC)    Drug use    Dysrhythmia    Palpitations   Fatty liver    GERD (gastroesophageal reflux disease)    Heart burn    High cholesterol    Hypertension    Iron deficiency    Joint pain    Palpitations    Panic attacks    PCOS (polycystic ovarian syndrome)    PCOS (polycystic ovarian syndrome)    Personal history of chemotherapy    Pneumonia    Port-A-Cath in place 07/31/2022   Shortness of breath    Tachycardia    Umbilical hernia    Vitamin D  deficiency    Past Surgical History:  Procedure Laterality Date   ADENOIDECTOMY     As a child   AXILLARY LYMPH NODE DISSECTION Left 11/20/2022   Procedure: LEFT AXILLARY DISSECTION;  Surgeon: Ebbie Cough, MD;  Location: [facility] OR;  Service: General;  Laterality: Left;   BREAST BIOPSY Left 05/29/2022   US  LT BREAST BX W LOC DEV 1ST LESION  IMG BX SPEC US  GUIDE 05/29/2022 [facility]   BREAST BIOPSY Left 05/29/2022   US  LT BREAST BX W LOC DEV EA ADD LESION IMG BX SPEC US  GUIDE 05/29/2022 [facility]   BREAST BIOPSY Left 06/06/2022   MM LT BREAST BX W LOC DEV 1ST LESION IMAGE BX SPEC STEREO GUIDE 06/06/2022 [facility]   BREAST BIOPSY  11/19/2022   MM LT RADIOACTIVE SEED LOC MAMMO GUIDE 11/19/2022 [facility]   BREAST BIOPSY  11/19/2022   MM LT RADIOACTIVE SEED EA ADD LESION LOC MAMMO GUIDE 11/19/2022 [facility]   BREAST BIOPSY  11/19/2022   MM LT RADIOACTIVE SEED EA ADD LESION LOC MAMMO GUIDE 11/19/2022 [facility]   BREAST LUMPECTOMY WITH RADIOACTIVE SEED AND AXILLARY LYMPH NODE DISSECTION Left 11/20/2022   Procedure: LEFT BREAST LUMPECTOMY WITH RADIOACTIVE SEED;  Surgeon: Ebbie Cough, MD;  Location:  [facility] OR;  Service: General;  Laterality: Left;  PEC BLOCK   CESAREAN SECTION  2015   PORT-A-CATH REMOVAL Right 11/20/2022   Procedure: REMOVAL PORT-A-CATH;  Surgeon: Ebbie Cough, MD;  Location: [facility] OR;  Service: General;  Laterality: Right;   PORTACATH PLACEMENT N/A 06/19/2022   Procedure: INSERTION PORT-A-CATH;  Surgeon: Ebbie Cough, MD;  Location: [facility] OR;  Service: General;  Laterality: N/A;   TONSILLECTOMY     As a child   Social History   Socioeconomic History   Marital status: Married    Spouse name: KEVIN COTHIEN   Number of children: 1   Years of education: some college   Highest education level: Associate degree: occupational, scientist, product/process development, or vocational program  Occupational History   Occupation: PATIENT RECRUITER AND OCCUPATIONAL PSYCHOLOGIST FOR [employer]  Tobacco Use   Smoking status: Former    Current packs/day: 0.00    Average packs/day: 1 pack/day for 7.0 years (7.0 ttl pk-yrs)    Types: Cigarettes, E-cigarettes    Start date: 03/20/2007    Quit date: 10/29/2021    Years since quitting: 2.5   Smokeless tobacco: Never   Tobacco comments:    Smoked cigarettes for 7 years, quit in 2015. Started vaping 2020 and just quit in July this year.  Vaping Use   Vaping status: Former   Quit date: 04/03/2019  Substance and Sexual Activity   Alcohol use: Not Currently    Comment: Only on special occasions   Drug use: Yes    Frequency: 1.0 times per week    Types: Marijuana   Sexual activity: Yes    Birth control/protection: Condom    Comment: Also on Zoladex  implant for [facility]  Other Topics Concern   Not on file  Social History Narrative      Social Drivers of Health   Tobacco Use: Medium Risk (04/19/2024)   Patient History    Smoking Tobacco Use: Former    Smokeless Tobacco Use: Never    Passive Exposure: Not on file  Financial Resource Strain: High Risk (01/29/2024)   Overall Financial Resource Strain (CARDIA)    Difficulty of Paying Living Expenses:  Hard  Food Insecurity: Food Insecurity Present (01/29/2024)   Epic    Worried About Programme Researcher, Broadcasting/film/video in the Last Year: Sometimes true    Ran Out of Food in the Last Year: Sometimes true  Transportation Needs: No Transportation Needs (01/29/2024)   Epic    Lack of Transportation (Medical): No    Lack of Transportation (Non-Medical): No  Physical Activity: Insufficiently Active (01/29/2024)   Exercise Vital Sign    Days  of Exercise per Week: 3 days    Minutes of Exercise per Session: 20 min  Stress: Stress Concern Present (01/29/2024)   [employer] of Occupational Health - Occupational Stress Questionnaire    Feeling of Stress: To some extent  Social Connections: Socially Integrated (01/29/2024)   Social Connection and Isolation Panel    Frequency of Communication with Friends and Family: Three times a week    Frequency of Social Gatherings with Friends and Family: Once a week    Attends Religious Services: More than 4 times per year    Active Member of Clubs or Organizations: Yes    Attends Banker Meetings: More than 4 times per year    Marital Status: Married  Depression (PHQ2-9): Medium Risk (02/18/2024)   Depression (PHQ2-9)    PHQ-2 Score: 10  Alcohol Screen: Low Risk (04/29/2023)   Alcohol Screen    Last Alcohol Screening Score (AUDIT): 1  Housing: High Risk (01/29/2024)   Epic    Unable to Pay for Housing in the Last Year: Yes    Number of Times Moved in the Last Year: 0    Homeless in the Last Year: No  Utilities: Not At Risk (11/20/2022)   AHC Utilities    Threatened with loss of utilities: No  Health Literacy: Not on file   Family History  Problem Relation Age of Onset   Heart disease Mother    Diabetes Mother    Hypertension Mother    Kidney disease Mother    Heart failure Mother    Heart attack Mother 54   Arthritis Mother    Asthma Mother    COPD Mother    Depression Mother    Hyperlipidemia Mother    Mental illness Mother     Bladder Cancer Mother    Anemia Mother    Anxiety disorder Mother    Early death Mother    Obesity Mother    Stroke Mother    Varicose Veins Mother    Liver disease Mother    Heart disease Father    Heart attack Father 59   COPD Father    Hyperlipidemia Father    Hypertension Father    Mental illness Father    Anxiety disorder Father    Early death Father    Obesity Father    Varicose Veins Father    Sudden death Father    Arthritis Sister    Depression Sister    Diabetes Sister    Hypertension Sister    Mental illness Sister    Miscarriages / Stillbirths Sister    Anxiety disorder Sister    Obesity Sister    Varicose Veins Sister    Alcohol abuse Sister    Depression Sister    Diabetes Sister    Heart disease Sister    Heart attack Sister 44   Arthritis Sister    Kidney disease Sister    Transient ischemic attack Sister    Hypertension Sister    Anxiety disorder Sister    Obesity Sister    Stroke Sister    Heart failure Maternal Grandmother    Arthritis Maternal Grandmother    Alcohol abuse Maternal Grandmother    COPD Maternal Grandmother    Diabetes Maternal Grandmother    Hearing loss Maternal Grandmother    Heart disease Maternal Grandmother    Hyperlipidemia Maternal Grandmother    Hypertension Maternal Grandmother    Stroke Maternal Grandmother    Miscarriages / Stillbirths Maternal Grandmother  Mental illness Maternal Grandmother    Heart attack Maternal Grandmother    Obesity Maternal Grandmother    Heart failure Maternal Grandfather    Alcohol abuse Maternal Grandfather    Arthritis Maternal Grandfather    Early death Maternal Grandfather    Heart disease Maternal Grandfather    Hyperlipidemia Maternal Grandfather    Hypertension Maternal Grandfather    Heart attack Maternal Grandfather 14   Mental illness Maternal Grandfather    Heart failure Paternal Grandmother    Arthritis Paternal Grandmother    Diabetes Paternal Grandmother    Heart  attack Paternal Grandmother    Miscarriages / Stillbirths Paternal Grandmother    Mental illness Paternal Grandmother    Kidney disease Paternal Grandmother    Intellectual disability Paternal Grandmother    Hypertension Paternal Grandmother    Hyperlipidemia Paternal Grandmother    Heart disease Paternal Grandmother    Obesity Paternal Grandmother    Heart failure Paternal Grandfather    Arthritis Paternal Grandfather    Heart attack Paternal Grandfather    Mental illness Paternal Grandfather    Hearing loss Paternal Grandfather    Heart disease Paternal Grandfather    Hyperlipidemia Paternal Grandfather    Hypertension Paternal Grandfather    Bladder Cancer Maternal Aunt    Colon cancer Maternal Aunt 77 - 2   Other Maternal Aunt 64 - 59       brain tumor (unknown if benign or malignant)   Lung cancer Paternal Aunt 85   Melanoma Paternal Aunt 61 - 49   Breast cancer Neg Hx    Prostate cancer Neg Hx    Ovarian cancer Neg Hx    Endometrial cancer Neg Hx    Pancreatic cancer Neg Hx    Allergies[1] Current Outpatient Medications  Medication Sig Dispense Refill   abemaciclib  (VERZENIO ) 100 MG tablet Take 1 tablet (100 mg total) by mouth 2 (two) times daily. 60 tablet 6   ALPRAZolam  (XANAX ) 0.5 MG tablet Take 1 tablet (0.5 mg total) by mouth at bedtime as needed for anxiety. 30 tablet 3   aspirin EC 81 MG tablet Take 81 mg by mouth daily. Swallow whole.     aspirin-acetaminophen -caffeine (EXCEDRIN MIGRAINE) 250-250-65 MG tablet Take 1-2 tablets by mouth 2 (two) times daily as needed for headache or migraine.     Blood Glucose Monitoring Suppl (ACCU-CHEK AVIVA PLUS) w/Device KIT Use to check blood sugar once daily at 6 (six) AM. 1 kit 0   carvedilol  (COREG ) 25 MG tablet Take 1 tablet (25 mg total) by mouth 2 (two) times daily with a meal. 180 tablet 1   cholecalciferol (VITAMIN D3) 25 MCG (1000 UNIT) tablet Take 1,000 Units by mouth daily.     esomeprazole (NEXIUM) 20 MG capsule  Take 20 mg by mouth daily at 12 noon.     gabapentin  (NEURONTIN ) 100 MG capsule TAKE 1 CAPSULE(100 MG) BY MOUTH THREE TIMES DAILY 90 capsule 3   glucose blood (ACCU-CHEK AVIVA PLUS) test strip Use as instructed 100 each 12   goserelin (ZOLADEX ) 3.6 MG injection Inject 3.6 mg into the skin every 28 (twenty-eight) days.     Lancets (ONETOUCH DELICA PLUS LANCET33G) MISC Use to check blood sugar once a day at 6 am. 100 each 0   letrozole  (FEMARA ) 2.5 MG tablet Take 1 tablet (2.5 mg total) by mouth daily. 90 tablet 3   lisinopril -hydrochlorothiazide  (ZESTORETIC ) 10-12.5 MG tablet Take 1 tablet by mouth daily. 90 tablet 0   ondansetron  (ZOFRAN ) 8  MG tablet Take 1 tablet (8 mg total) by mouth every 8 (eight) hours as needed for nausea or vomiting. 20 tablet 1   ONE TOUCH CLUB LANCETS MISC 1 each by Does not apply route daily. 100 each 11   oxyCODONE  (ROXICODONE ) 5 MG immediate release tablet Take 1 tablet (5 mg total) by mouth every 4 (four) hours as needed for severe pain (pain score 7-10). 15 tablet 0   prochlorperazine  (COMPAZINE ) 10 MG tablet Take 1 tablet (10 mg total) by mouth every 6 (six) hours as needed for nausea or vomiting. 30 tablet 2   tacrolimus  (PROTOPIC ) 0.1 % ointment Apply topically 2 (two) times daily. 100 g 0   tirzepatide  (MOUNJARO ) 2.5 MG/0.5ML Pen Inject 2.5 mg into the skin once a week. 2 mL 1   triamcinolone  cream (KENALOG ) 0.1 % Apply 1 Application topically 2 (two) times daily. 30 g 0   Vitamin D , Ergocalciferol , (DRISDOL ) 1.25 MG (50000 UNIT) CAPS capsule Take 1 capsule (50,000 Units total) by mouth every 7 (seven) days. 12 capsule 0   No current facility-administered medications for this visit.   No results found.  Review of Systems:   A ROS was performed including pertinent positives and negatives as documented in the HPI.  Physical Exam :   Constitutional: NAD and appears stated age Neurological: Alert and oriented Psych: Appropriate affect and cooperative Last  menstrual period 04/03/2023.   Comprehensive Musculoskeletal Exam:    Exam of the left knee demonstrates mild soft tissue edema with no focal ecchymosis.  There is some tenderness over the patellar tendon, lateral joint line, and lateral femoral condyle.  Stable collaterals with varus and valgus stress.  Patella is mobile and nontender.  Knee range of motion from 0 to 120 degrees without palpable crepitus. Ecchymosis over the dorsum of the left great toe, mainly of the proximal phalanx.  No evidence of nailbed injury.  Brisk capillary refill to the distal toe with intact sensation.  Mild discomfort with passive range of motion of the IP joint.  Imaging:   Xray (left knee 4 views): Negative for fracture or dislocation and joint spaces are well-maintained  Xray (left great toe 3 views): Negative for fracture or dislocation   I personally reviewed and interpreted the radiographs.      Assessment & Plan Great toe pain, left She sustained a direct contusion to the left great toe with soft tissue bruising and a ruptured blood blister after dropping a heavy knife handle on it. She ambulates without significant limitation and can bend the toe without severe pain. Radiographs revealed no fracture or bony injury; the injury is limited to soft tissue involvement. - Reviewed left great toe radiographs, which showed no fracture. - Offered buddy taping for comfort as needed. - Advised that a rigid shoe is unnecessary unless symptoms worsen or ambulation becomes difficult. - Provided reassurance that no further intervention is required unless symptoms change.  Acute pain of left knee She developed acute left knee pain following a fall, with pain on flexion, occasional clicking, and localized tenderness. Examination showed mild swelling and stable ligaments. Radiographs demonstrated no fracture or acute bony abnormality. Findings are consistent with a contusion and possible bone bruise, without  significant structural injury. She has a remote history of bursa rupture in the same knee but no prior chronic symptoms. Symptoms are expected to gradually improve; worsening or mechanical symptoms would warrant re-evaluation. - Reviewed left knee radiographs, which showed no acute fracture or bony abnormality. - Recommended  continued acetaminophen  for analgesia. - Discussed topical diclofenac (Voltaren) as an alternative to oral NSAIDs. - Advised continued ice application for five days post-injury and to avoid heat initially to minimize swelling. - Recommended activity modification, including limiting stair use and ascending/descending stairs one leg at a time with handrail support. - Advised against brace use unless instability or need for additional support arises. - Provided anticipatory guidance regarding expected gradual improvement and instructed to return for re-evaluation if mechanical symptoms (locking, catching, giving way) or worsening pain develop. - Issued work note for absence as requested.       I personally saw and evaluated the patient, and participated in the management and treatment plan.  Leonce Reveal, PA-C Orthopedics    [1]  Allergies Allergen Reactions   Macrobid [Nitrofurantoin Macrocrystal] Shortness Of Breath and Nausea And Vomiting   Ibuprofen Hives   "

## 2024-05-20 ENCOUNTER — Ambulatory Visit: Admitting: Podiatry

## 2024-05-21 ENCOUNTER — Ambulatory Visit: Admitting: Nurse Practitioner

## 2024-05-28 ENCOUNTER — Inpatient Hospital Stay: Attending: Hematology and Oncology

## 2024-05-29 ENCOUNTER — Encounter

## 2024-06-01 ENCOUNTER — Inpatient Hospital Stay

## 2024-06-01 ENCOUNTER — Inpatient Hospital Stay: Admitting: Hematology and Oncology

## 2024-06-15 ENCOUNTER — Inpatient Hospital Stay: Admitting: Hematology and Oncology

## 2024-06-15 ENCOUNTER — Inpatient Hospital Stay: Attending: Hematology and Oncology
